# Patient Record
Sex: Female | Born: 1945 | Race: White | Hispanic: No | Marital: Married | Smoking: Never smoker
Health system: Southern US, Community
[De-identification: ages and names within clinical notes are randomized; demographics above are authoritative.]

## PROBLEM LIST (undated history)

## (undated) DIAGNOSIS — I803 Phlebitis and thrombophlebitis of lower extremities, unspecified: Secondary | ICD-10-CM

## (undated) DIAGNOSIS — T7840XA Allergy, unspecified, initial encounter: Secondary | ICD-10-CM

## (undated) DIAGNOSIS — L439 Lichen planus, unspecified: Secondary | ICD-10-CM

## (undated) DIAGNOSIS — G4700 Insomnia, unspecified: Secondary | ICD-10-CM

## (undated) DIAGNOSIS — K648 Other hemorrhoids: Secondary | ICD-10-CM

## (undated) DIAGNOSIS — I444 Left anterior fascicular block: Secondary | ICD-10-CM

## (undated) DIAGNOSIS — E559 Vitamin D deficiency, unspecified: Secondary | ICD-10-CM

## (undated) DIAGNOSIS — M797 Fibromyalgia: Secondary | ICD-10-CM

## (undated) DIAGNOSIS — Z9223 Personal history of estrogen therapy: Secondary | ICD-10-CM

## (undated) DIAGNOSIS — R6882 Decreased libido: Secondary | ICD-10-CM

## (undated) DIAGNOSIS — K1379 Other lesions of oral mucosa: Secondary | ICD-10-CM

## (undated) DIAGNOSIS — G43109 Migraine with aura, not intractable, without status migrainosus: Secondary | ICD-10-CM

## (undated) DIAGNOSIS — E7800 Pure hypercholesterolemia, unspecified: Secondary | ICD-10-CM

## (undated) DIAGNOSIS — M1990 Unspecified osteoarthritis, unspecified site: Secondary | ICD-10-CM

## (undated) DIAGNOSIS — M169 Osteoarthritis of hip, unspecified: Secondary | ICD-10-CM

## (undated) DIAGNOSIS — F909 Attention-deficit hyperactivity disorder, unspecified type: Secondary | ICD-10-CM

## (undated) DIAGNOSIS — B001 Herpesviral vesicular dermatitis: Secondary | ICD-10-CM

## (undated) DIAGNOSIS — J329 Chronic sinusitis, unspecified: Secondary | ICD-10-CM

## (undated) DIAGNOSIS — R1084 Generalized abdominal pain: Secondary | ICD-10-CM

## (undated) DIAGNOSIS — D649 Anemia, unspecified: Secondary | ICD-10-CM

## (undated) DIAGNOSIS — N9489 Other specified conditions associated with female genital organs and menstrual cycle: Secondary | ICD-10-CM

## (undated) DIAGNOSIS — F329 Major depressive disorder, single episode, unspecified: Secondary | ICD-10-CM

## (undated) DIAGNOSIS — I831 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: Secondary | ICD-10-CM

## (undated) DIAGNOSIS — L305 Pityriasis alba: Secondary | ICD-10-CM

## (undated) DIAGNOSIS — R51 Headache: Secondary | ICD-10-CM

## (undated) DIAGNOSIS — K5900 Constipation, unspecified: Secondary | ICD-10-CM

## (undated) DIAGNOSIS — R350 Frequency of micturition: Secondary | ICD-10-CM

## (undated) DIAGNOSIS — H6090 Unspecified otitis externa, unspecified ear: Secondary | ICD-10-CM

## (undated) DIAGNOSIS — G4710 Hypersomnia, unspecified: Secondary | ICD-10-CM

## (undated) DIAGNOSIS — H9312 Tinnitus, left ear: Secondary | ICD-10-CM

## (undated) DIAGNOSIS — F419 Anxiety disorder, unspecified: Secondary | ICD-10-CM

## (undated) DIAGNOSIS — K589 Irritable 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: Secondary | ICD-10-CM

## (undated) DIAGNOSIS — R1033 Periumbilical pain: Secondary | ICD-10-CM

## (undated) DIAGNOSIS — N390 Urinary tract infection, site not specified: Secondary | ICD-10-CM

## (undated) DIAGNOSIS — E039 Hypothyroidism, unspecified: Secondary | ICD-10-CM

## (undated) DIAGNOSIS — R32 Unspecified urinary incontinence: Secondary | ICD-10-CM

## (undated) DIAGNOSIS — N189 Chronic kidney disease, unspecified: Secondary | ICD-10-CM

## (undated) DIAGNOSIS — R946 Abnormal results of thyroid function studies: Secondary | ICD-10-CM

## (undated) DIAGNOSIS — N809 Endometriosis, unspecified: Secondary | ICD-10-CM

## (undated) DIAGNOSIS — M8580 Other specified disorders of bone density and structure, unspecified site: Secondary | ICD-10-CM

## (undated) DIAGNOSIS — L719 Rosacea, unspecified: Secondary | ICD-10-CM

## (undated) DIAGNOSIS — L509 Urticaria, unspecified: Secondary | ICD-10-CM

## (undated) DIAGNOSIS — T733XXA Exhaustion due to excessive exertion, initial encounter: Secondary | ICD-10-CM

## (undated) DIAGNOSIS — N201 Calculus of ureter: Secondary | ICD-10-CM

## (undated) DIAGNOSIS — L989 Disorder of the skin and subcutaneous tissue, unspecified: Secondary | ICD-10-CM

## (undated) DIAGNOSIS — J309 Allergic rhinitis, unspecified: Secondary | ICD-10-CM

## (undated) DIAGNOSIS — K30 Functional dyspepsia: Secondary | ICD-10-CM

## (undated) DIAGNOSIS — E785 Hyperlipidemia, unspecified: Secondary | ICD-10-CM

## (undated) DIAGNOSIS — S93409A Sprain of unspecified ligament of unspecified ankle, initial encounter: Secondary | ICD-10-CM

## (undated) DIAGNOSIS — F341 Dysthymic disorder: Secondary | ICD-10-CM

## (undated) DIAGNOSIS — Z8249 Family history of ischemic heart disease and other diseases of the circulatory system: Secondary | ICD-10-CM

## (undated) DIAGNOSIS — E739 Lactose intolerance, unspecified: Secondary | ICD-10-CM

## (undated) DIAGNOSIS — K429 Umbilical hernia without obstruction or gangrene: Secondary | ICD-10-CM

## (undated) DIAGNOSIS — E782 Mixed hyperlipidemia: Secondary | ICD-10-CM

## (undated) DIAGNOSIS — K219 Gastro-esophageal reflux disease without esophagitis: Secondary | ICD-10-CM

## (undated) DIAGNOSIS — K6289 Other specified diseases of anus and rectum: Secondary | ICD-10-CM

## (undated) HISTORY — PX: REDUCTION MAMMAPLASTY: SUR839

## (undated) HISTORY — DX: Unspecified otitis externa, unspecified ear: H60.90

## (undated) HISTORY — DX: Other specified diseases of anus and rectum: K62.89

## (undated) HISTORY — DX: Anemia, unspecified: D64.9

## (undated) HISTORY — DX: Other hemorrhoids: K64.8

## (undated) HISTORY — PX: TUBAL LIGATION: SHX77

## (undated) HISTORY — DX: Decreased libido: R68.82

## (undated) HISTORY — DX: Urticaria, unspecified: L50.9

## (undated) HISTORY — DX: Phlebitis and thrombophlebitis of lower extremities, unspecified: I80.3

## (undated) HISTORY — DX: Lichen planus, unspecified: L43.9

## (undated) HISTORY — DX: Functional dyspepsia: K30

## (undated) HISTORY — DX: Hyperlipidemia, unspecified: E78.5

## (undated) HISTORY — DX: Gastro-esophageal reflux disease without esophagitis: K21.9

## (undated) HISTORY — DX: Tinnitus, left ear: H93.12

## (undated) HISTORY — PX: COLONOSCOPY: SHX174

## (undated) HISTORY — DX: Mixed hyperlipidemia: E78.2

## (undated) HISTORY — DX: Endometriosis, unspecified: N80.9

## (undated) HISTORY — DX: Calculus of ureter: N20.1

## (undated) HISTORY — DX: Periumbilical pain: R10.33

## (undated) HISTORY — PX: BREAST ENHANCEMENT SURGERY: SHX7

## (undated) HISTORY — DX: Attention-deficit hyperactivity disorder, unspecified type: F90.9

## (undated) HISTORY — DX: Insomnia, unspecified: G47.00

## (undated) HISTORY — DX: Family history of ischemic heart disease and other diseases of the circulatory system: Z82.49

## (undated) HISTORY — DX: 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: I83.10

## (undated) HISTORY — DX: Frequency of micturition: R35.0

## (undated) HISTORY — PX: ROTATOR CUFF REPAIR: SHX139

## (undated) HISTORY — DX: Unspecified osteoarthritis, unspecified site: M19.90

## (undated) HISTORY — DX: Allergy, unspecified, initial encounter: T78.40XA

## (undated) HISTORY — DX: Migraine with aura, not intractable, without status migrainosus: G43.109

## (undated) HISTORY — DX: Personal history of estrogen therapy: Z92.23

## (undated) HISTORY — DX: Abnormal results of thyroid function studies: R94.6

## (undated) HISTORY — DX: Umbilical hernia without obstruction or gangrene: K42.9

## (undated) HISTORY — DX: Lactose intolerance, unspecified: E73.9

## (undated) HISTORY — DX: Disorder of the skin and subcutaneous tissue, unspecified: L98.9

## (undated) HISTORY — DX: Left anterior fascicular block: I44.4

## (undated) HISTORY — PX: ABDOMINAL HYSTERECTOMY: SHX81

## (undated) HISTORY — DX: Sprain of unspecified ligament of unspecified ankle, initial encounter: S93.409A

## (undated) HISTORY — DX: Urinary tract infection, site not specified: N39.0

## (undated) HISTORY — DX: Pure hypercholesterolemia, unspecified: E78.00

## (undated) HISTORY — DX: Exhaustion due to excessive exertion, initial encounter: T73.3XXA

## (undated) HISTORY — DX: Pityriasis alba: L30.5

## (undated) HISTORY — DX: Other specified conditions associated with female genital organs and menstrual cycle: N94.89

## (undated) HISTORY — DX: Hypersomnia, unspecified: G47.10

## (undated) HISTORY — DX: Osteoarthritis of hip, unspecified: M16.9

## (undated) HISTORY — DX: Rosacea, unspecified: L71.9

## (undated) HISTORY — DX: Herpesviral vesicular dermatitis: B00.1

## (undated) HISTORY — DX: Dysthymic disorder: F34.1

## (undated) HISTORY — DX: Major depressive disorder, single episode, unspecified: F32.9

## (undated) HISTORY — PX: NASAL SINUS SURGERY: SHX719

## (undated) HISTORY — PX: TOTAL ABDOMINAL HYSTERECTOMY: SHX209

## (undated) HISTORY — PX: OTHER SURGICAL HISTORY: SHX169

## (undated) HISTORY — DX: Other specified disorders of bone density and structure, unspecified site: M85.80

## (undated) HISTORY — DX: Vitamin D deficiency, unspecified: E55.9

## (undated) HISTORY — DX: Fibromyalgia: M79.7

## (undated) HISTORY — DX: Chronic sinusitis, unspecified: J32.9

## (undated) HISTORY — DX: Constipation, unspecified: K59.00

## (undated) HISTORY — DX: Allergic rhinitis, unspecified: J30.9

## (undated) HISTORY — DX: Other lesions of oral mucosa: K13.79

## (undated) HISTORY — DX: Irritable 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: K58.9

## (undated) HISTORY — DX: Unspecified urinary incontinence: R32

## (undated) HISTORY — DX: Generalized abdominal pain: R10.84

---

## 1998-07-20 ENCOUNTER — Ambulatory Visit (HOSPITAL_COMMUNITY): Admission: RE | Admit: 1998-07-20 | Discharge: 1998-07-20 | Payer: Self-pay | Admitting: *Deleted

## 1998-12-26 HISTORY — PX: OTHER SURGICAL HISTORY: SHX169

## 1999-06-01 ENCOUNTER — Other Ambulatory Visit: Admission: RE | Admit: 1999-06-01 | Discharge: 1999-06-01 | Payer: Self-pay | Admitting: Obstetrics and Gynecology

## 2000-02-01 ENCOUNTER — Encounter: Payer: Self-pay | Admitting: Family Medicine

## 2000-02-01 ENCOUNTER — Encounter: Admission: RE | Admit: 2000-02-01 | Discharge: 2000-02-01 | Payer: Self-pay | Admitting: Family Medicine

## 2000-02-07 ENCOUNTER — Ambulatory Visit (HOSPITAL_BASED_OUTPATIENT_CLINIC_OR_DEPARTMENT_OTHER): Admission: RE | Admit: 2000-02-07 | Discharge: 2000-02-07 | Payer: Self-pay | Admitting: Orthopedic Surgery

## 2000-04-11 ENCOUNTER — Other Ambulatory Visit: Admission: RE | Admit: 2000-04-11 | Discharge: 2000-04-11 | Payer: Self-pay | Admitting: Obstetrics and Gynecology

## 2000-07-12 ENCOUNTER — Encounter: Payer: Self-pay | Admitting: Internal Medicine

## 2000-07-12 ENCOUNTER — Encounter: Admission: RE | Admit: 2000-07-12 | Discharge: 2000-07-12 | Payer: Self-pay | Admitting: Internal Medicine

## 2002-05-21 ENCOUNTER — Encounter: Admission: RE | Admit: 2002-05-21 | Discharge: 2002-05-21 | Payer: Self-pay | Admitting: Internal Medicine

## 2002-05-21 ENCOUNTER — Encounter: Payer: Self-pay | Admitting: Internal Medicine

## 2002-06-04 ENCOUNTER — Other Ambulatory Visit: Admission: RE | Admit: 2002-06-04 | Discharge: 2002-06-04 | Payer: Self-pay | Admitting: Obstetrics and Gynecology

## 2002-08-08 ENCOUNTER — Ambulatory Visit (HOSPITAL_COMMUNITY): Admission: RE | Admit: 2002-08-08 | Discharge: 2002-08-08 | Payer: Self-pay | Admitting: Gastroenterology

## 2004-01-20 ENCOUNTER — Other Ambulatory Visit: Admission: RE | Admit: 2004-01-20 | Discharge: 2004-01-20 | Payer: Self-pay | Admitting: Obstetrics and Gynecology

## 2004-03-22 ENCOUNTER — Ambulatory Visit (HOSPITAL_COMMUNITY): Admission: RE | Admit: 2004-03-22 | Discharge: 2004-03-22 | Payer: Self-pay | Admitting: Chiropractic Medicine

## 2004-08-16 ENCOUNTER — Ambulatory Visit (HOSPITAL_BASED_OUTPATIENT_CLINIC_OR_DEPARTMENT_OTHER): Admission: RE | Admit: 2004-08-16 | Discharge: 2004-08-16 | Payer: Self-pay | Admitting: Orthopedic Surgery

## 2004-12-26 HISTORY — PX: SEPTOPLASTY WITH ETHMOIDECTOMY, AND MAXILLARY ANTROSTOMY: SHX6090

## 2005-04-27 ENCOUNTER — Encounter: Admission: RE | Admit: 2005-04-27 | Discharge: 2005-04-27 | Payer: Self-pay | Admitting: Obstetrics and Gynecology

## 2006-06-13 ENCOUNTER — Encounter: Admission: RE | Admit: 2006-06-13 | Discharge: 2006-06-13 | Payer: Self-pay | Admitting: Obstetrics and Gynecology

## 2006-06-26 ENCOUNTER — Encounter: Admission: RE | Admit: 2006-06-26 | Discharge: 2006-06-26 | Payer: Self-pay | Admitting: Obstetrics and Gynecology

## 2010-12-26 DIAGNOSIS — N189 Chronic kidney disease, unspecified: Secondary | ICD-10-CM

## 2010-12-26 HISTORY — DX: Chronic kidney disease, unspecified: N18.9

## 2011-01-16 ENCOUNTER — Encounter: Payer: Self-pay | Admitting: Internal Medicine

## 2013-01-21 ENCOUNTER — Other Ambulatory Visit: Payer: Self-pay | Admitting: Internal Medicine

## 2013-01-21 ENCOUNTER — Other Ambulatory Visit (HOSPITAL_COMMUNITY)
Admission: RE | Admit: 2013-01-21 | Discharge: 2013-01-21 | Disposition: A | Discharge status: Home / Self Care | Payer: BC Managed Care – PPO | Source: Ambulatory Visit | Attending: Internal Medicine | Admitting: Internal Medicine

## 2013-01-21 DIAGNOSIS — Z1151 Encounter for screening for human papillomavirus (HPV): Secondary | ICD-10-CM | POA: Insufficient documentation

## 2013-01-21 DIAGNOSIS — Z01419 Encounter for gynecological examination (general) (routine) without abnormal findings: Secondary | ICD-10-CM | POA: Insufficient documentation

## 2013-02-19 ENCOUNTER — Other Ambulatory Visit: Payer: Self-pay | Admitting: Gastroenterology

## 2013-02-19 ENCOUNTER — Encounter (HOSPITAL_COMMUNITY): Payer: Self-pay | Admitting: *Deleted

## 2013-02-19 NOTE — Pre-Procedure Instructions (Signed)
Your procedure is scheduled [id number], February 25, 2013 Report to [facility] Admitting [id number] Call this number if you have problems morning of your [phone]  Follow all bowel prep instructions per your doctor's orders.  Do not eat or drink anything after midnight the night before your procedure. You may brush your teeth, rinse out your mouth, but no water, no food, no chewing gum, no mints, no candies, no chewing tobacco.     Take these medicines the morning of your procedure with A SIP OF WATER:Pantoprazole   Please make arrangements for a responsible person to drive you home after the procedure. You cannot go home by cab/taxi. We recommend you have someone with you at home the first 24 hours after your procedure. Driver for procedure is spouse Onda Kattner [phone]  LEAVE ALL VALUABLES, JEWELRY, BILLFOLD AT HOME.  NO DENTURES, CONTACT LENSES ALLOWED IN THE ENDOSCOPY ROOM.   YOU MAY WEAR DEODORANT, PLEASE REMOVE ALL JEWELRY, WATCHES RINGS, BODY PIERCINGS AND LEAVE AT HOME.   WOMEN: NO MAKE-UP, LOTIONS PERFUMES

## 2013-02-22 ENCOUNTER — Encounter (HOSPITAL_COMMUNITY): Payer: Self-pay | Admitting: Pharmacy Technician

## 2013-02-25 ENCOUNTER — Encounter (HOSPITAL_COMMUNITY)
Admission: RE | Disposition: A | Discharge status: Home / Self Care | Payer: Self-pay | Source: Ambulatory Visit | Attending: Gastroenterology

## 2013-02-25 ENCOUNTER — Ambulatory Visit (HOSPITAL_COMMUNITY)
Admission: RE | Admit: 2013-02-25 | Discharge: 2013-02-25 | Disposition: A | Discharge status: Home / Self Care | Payer: BC Managed Care – PPO | Source: Ambulatory Visit | Attending: Gastroenterology | Admitting: Gastroenterology

## 2013-02-25 ENCOUNTER — Encounter (HOSPITAL_COMMUNITY): Payer: Self-pay | Admitting: *Deleted

## 2013-02-25 ENCOUNTER — Encounter (HOSPITAL_COMMUNITY): Payer: Self-pay | Admitting: Anesthesiology

## 2013-02-25 ENCOUNTER — Ambulatory Visit (HOSPITAL_COMMUNITY): Payer: BC Managed Care – PPO | Admitting: Anesthesiology

## 2013-02-25 DIAGNOSIS — Z79899 Other long term (current) drug therapy: Secondary | ICD-10-CM | POA: Insufficient documentation

## 2013-02-25 DIAGNOSIS — K219 Gastro-esophageal reflux disease without esophagitis: Secondary | ICD-10-CM | POA: Insufficient documentation

## 2013-02-25 DIAGNOSIS — Z7982 Long term (current) use of aspirin: Secondary | ICD-10-CM | POA: Insufficient documentation

## 2013-02-25 DIAGNOSIS — IMO0001 Reserved for inherently not codable concepts without codable children: Secondary | ICD-10-CM | POA: Insufficient documentation

## 2013-02-25 DIAGNOSIS — E039 Hypothyroidism, unspecified: Secondary | ICD-10-CM | POA: Insufficient documentation

## 2013-02-25 DIAGNOSIS — E7800 Pure hypercholesterolemia, unspecified: Secondary | ICD-10-CM | POA: Insufficient documentation

## 2013-02-25 DIAGNOSIS — Z1211 Encounter for screening for malignant neoplasm of colon: Secondary | ICD-10-CM | POA: Insufficient documentation

## 2013-02-25 HISTORY — DX: Chronic kidney disease, unspecified: N18.9

## 2013-02-25 HISTORY — PX: COLONOSCOPY WITH PROPOFOL: SHX5780

## 2013-02-25 HISTORY — DX: Unspecified osteoarthritis, unspecified site: M19.90

## 2013-02-25 HISTORY — DX: Pure hypercholesterolemia, unspecified: E78.00

## 2013-02-25 HISTORY — DX: Anxiety disorder, unspecified: F41.9

## 2013-02-25 HISTORY — DX: Headache: R51

## 2013-02-25 HISTORY — DX: Hypothyroidism, unspecified: E03.9

## 2013-02-25 SURGERY — COLONOSCOPY WITH PROPOFOL
Anesthesia: Monitor Anesthesia Care

## 2013-02-25 MED ORDER — MIDAZOLAM HCL 5 MG/5ML IJ SOLN
INTRAMUSCULAR | Status: DC | PRN
  Administered 2013-02-25: 2 mg via INTRAVENOUS

## 2013-02-25 MED ORDER — FENTANYL CITRATE 0.05 MG/ML IJ SOLN
INTRAMUSCULAR | Status: DC | PRN
  Administered 2013-02-25 (×2): 50 ug via INTRAVENOUS

## 2013-02-25 MED ORDER — ONDANSETRON HCL 4 MG/2ML IJ SOLN
INTRAMUSCULAR | Status: DC | PRN
  Administered 2013-02-25: 4 mg via INTRAVENOUS

## 2013-02-25 MED ORDER — LACTATED RINGERS IV SOLN
INTRAVENOUS | Status: DC | PRN
  Administered 2013-02-25: 09:00:00 via INTRAVENOUS

## 2013-02-25 MED ORDER — PROPOFOL 10 MG/ML IV EMUL
INTRAVENOUS | Status: DC | PRN
  Administered 2013-02-25: 200 ug/kg/min via INTRAVENOUS

## 2013-02-25 MED ORDER — SODIUM CHLORIDE 0.9 % IV SOLN: INTRAVENOUS | Status: DC

## 2013-02-25 MED ORDER — LACTATED RINGERS IV SOLN
INTRAVENOUS | Status: DC
  Administered 2013-02-25: 09:00:00 via INTRAVENOUS

## 2013-02-25 SURGICAL SUPPLY — 21 items

## 2013-02-25 NOTE — H&P (Signed)
  Procedure: Screening colonoscopy  History: The patient is a 67 year old female born 06/29/1946. On 08/08/2002, the patient underwent a normal screening colonoscopy under propofol sedation. The patient is scheduled to undergo a repeat screening colonoscopy today.  Chronic medications: Nystatin suspension. Crestor. MiraLAX. Aspirin. MetroGel. Hydrochlorothiazide. Acyclovir. Synthroid. Protonix. Menest. Ativan. Zaleplon. Salvella.  Past medical history: Fibromyalgia syndrome. Chronic urticaria. Hypothyroidism. Gastroesophageal reflux. Rosacea. Hypercholesterolemia. Constipation predominant irritable bowel syndrome. Dysthymia. Insomnia. Degenerative joint disease. Umbilical hernia repair. Rotator cuff surgery. Facelift surgery. Breast augmentation surgery. Total abdominal hysterectomy. Pityriasuis lichenocoid chronicum.  Medication allergies: Niacin. ketoconazole. Vytorin. Amoxicillin. Lexapro. Adhesive tape.  Habits: The patient does not smoke cigarettes. She consumes alcohol in moderation.  Plan: Proceed with screening colonoscopy with colon polyp removal to prevent colon cancer.

## 2013-02-25 NOTE — Op Note (Signed)
Procedure: Screening colonoscopy  Endoscopist: Danise Edge  Premedication: Propofol administered by anesthesia  Procedure: The patient was placed in the left lateral decubitus position. Anal inspection and digital rectal exam were normal. The Pentax pediatric colonoscope was introduced into the rectum and advanced to the cecum. A normal-appearing ileocecal valve and appendiceal orifice were identified. Colonic preparation for the exam today was good.  Rectum. Normal. Retroflexed view of the distal rectum normal.  Sigmoid colon and descending colon. Normal.  Splenic flexure. Normal.  Transverse colon. Normal.  Hepatic flexure. Normal.  Ascending colon. Normal.  Cecum and ileocecal valve. Normal.  Assessment: Normal screening proctocolonoscopy to the cecum.  Recommendation: Repeat screening colonoscopy in 10 years.

## 2013-02-25 NOTE — Anesthesia Postprocedure Evaluation (Signed)
  Anesthesia Post-op Note  Patient: Wendy Joyce  Procedure(s) Performed: Procedure(s) (LRB): COLONOSCOPY WITH PROPOFOL (N/A)  Patient Location: PACU  Anesthesia Type: MAC  Level of Consciousness: awake and alert   Airway and Oxygen Therapy: Patient Spontanous Breathing  Post-op Pain: mild  Post-op Assessment: Post-op Vital signs reviewed, Patient's Cardiovascular Status Stable, Respiratory Function Stable, Patent Airway and No signs of Nausea or vomiting  Last Vitals:  Filed Vitals:   02/25/13 1130  BP: 118/81  Pulse:   Temp:   Resp: 15    Post-op Vital Signs: stable   Complications: No apparent anesthesia complications

## 2013-02-25 NOTE — Anesthesia Preprocedure Evaluation (Signed)
Anesthesia Evaluation  Patient identified by MRN, date of birth, ID band Patient awake    Reviewed: Allergy & Precautions, H&P , NPO status , Patient's Chart, lab work & pertinent test results, reviewed documented beta blocker date and time   Airway Mallampati: II TM Distance: >3 FB Neck ROM: full    Dental no notable dental hx.    Pulmonary neg pulmonary ROS,  breath sounds clear to auscultation  Pulmonary exam normal       Cardiovascular Exercise Tolerance: Good negative cardio ROS  Rhythm:regular Rate:Normal     Neuro/Psych  Headaches, negative neurological ROS  negative psych ROS   GI/Hepatic negative GI ROS, Neg liver ROS, GERD-  Medicated,  Endo/Other  negative endocrine ROSHypothyroidism   Renal/GU Renal diseasenegative Renal ROS  negative genitourinary   Musculoskeletal   Abdominal   Peds  Hematology negative hematology ROS (+)   Anesthesia Other Findings   Reproductive/Obstetrics negative OB ROS                           Anesthesia Physical Anesthesia Plan  ASA: II  Anesthesia Plan: MAC   Post-op Pain Management:    Induction:   Airway Management Planned:   Additional Equipment:   Intra-op Plan:   Post-operative Plan:   Informed Consent: I have reviewed the patients History and Physical, chart, labs and discussed the procedure including the risks, benefits and alternatives for the proposed anesthesia with the patient or authorized representative who has indicated his/her understanding and acceptance.   Dental Advisory Given  Plan Discussed with: CRNA  Anesthesia Plan Comments:         Anesthesia Quick Evaluation

## 2013-02-25 NOTE — Transfer of Care (Signed)
Immediate Anesthesia Transfer of Care Note  Patient: Wendy Joyce  Procedure(s) Performed: Procedure(s) (LRB): COLONOSCOPY WITH PROPOFOL (N/A)  Patient Location: PACU  Anesthesia Type: MAC  Level of Consciousness: sedated, patient cooperative and responds to stimulaton  Airway & Oxygen Therapy: Patient Spontanous Breathing and Patient connected to face mask oxgen  Post-op Assessment: Report given to PACU RN and Post -op Vital signs reviewed and stable  Post vital signs: Reviewed and stable  Complications: No apparent anesthesia complications

## 2013-02-26 ENCOUNTER — Encounter (HOSPITAL_COMMUNITY): Payer: Self-pay | Admitting: Gastroenterology

## 2013-09-06 ENCOUNTER — Ambulatory Visit: Payer: BC Managed Care – PPO | Admitting: Internal Medicine

## 2013-09-26 ENCOUNTER — Encounter: Payer: Self-pay | Admitting: Internal Medicine

## 2013-09-26 ENCOUNTER — Ambulatory Visit (INDEPENDENT_AMBULATORY_CARE_PROVIDER_SITE_OTHER): Payer: BC Managed Care – PPO | Admitting: Internal Medicine

## 2013-09-26 ENCOUNTER — Other Ambulatory Visit: Payer: Self-pay | Admitting: Internal Medicine

## 2013-09-26 VITALS — BP 112/70 | HR 84 | Temp 98.1°F | Resp 10 | Ht 63.5 in | Wt 132.5 lb

## 2013-09-26 DIAGNOSIS — E038 Other specified hypothyroidism: Secondary | ICD-10-CM

## 2013-09-26 DIAGNOSIS — E039 Hypothyroidism, unspecified: Secondary | ICD-10-CM

## 2013-09-26 LAB — T3, FREE: T3, Free: 4.1 pg/mL (ref 2.3–4.2)

## 2013-09-26 LAB — TSH: TSH: 0.021 u[IU]/mL — ABNORMAL LOW (ref 0.350–4.500)

## 2013-09-26 NOTE — Progress Notes (Signed)
Patient ID: Wendy Joyce, female   DOB: [birth year], 67 y.o.   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 She sees Dr Joesphine [facility]).   Pt tells me she has been told her Hypothalamus has "a second floor" while she was in college. This was based on appearance on Xrays then and does not remember having an  MRI afterwards. She was then told that this condition did not cause problems then (other than central hypothyroidism) but may cause problems in the future. She is wondering if there is a connection between her hypothalamic condition and her current symptoms. 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 - neurological dyslexia)  Pt. has been dx with hypothyroidism in college as mentioned above; was on Levothyroxine 112 mcg >> now in the last month, on Armour thyroid 90 mg, taken: - fasting - with water - separated by 2h from b'fast  - separated by >4h from calcium - separated by >4h from PPIs - no iron, multivitamins   I did not have  thyroid tests to review.    Pt denies feeling nodules in neck, has hoarseness, no dysphagia/odynophagia, SOB with lying down.   She c/o: - weight gain - mostly in abdomen, despite eating healthy and exercising regularly. - fatigue - constipation - fibromyalgia generalized pain - difficulty losing weight (per our chart, she lost 8 lbs since 02/2013). - dry skin - hair falling - problems with concentration - mood swings, depression  No h/o infertility and had 2 children. Had hysterectomy at 67 y/o b/c blood clots.  She has a + FH of thyroid disorders in: mother and sisters. No FH of thyroid cancer.  No h/o radiation tx to head or neck.  I reviewed her chart and she also has a history of HTN, HL - on Crestor, GERD - on Protonix, fibromyalgia, anxiety - on Ativan, DJD,  chronic urticaria, IBS - constipation, h/o face lifting and breast augmentation sx.  ROS: Constitutional: + weight gain, + fatigue, no subjective hyperthermia/hypothermia Eyes: no blurry vision, no xerophthalmia ENT: no sore throat, no nodules palpated in throat, no dysphagia/odynophagia, + hoarseness; + decreased hearing, + tinnitus Cardiovascular: no CP/SOB/palpitations/leg swelling Respiratory: no cough/SOB Gastrointestinal: no N/V/D/+ C, + heartburn Musculoskeletal: no muscle/joint aches Skin: + rashes, + hair loss Neurological: no tremors/numbness/tingling/dizziness Psychiatric: + depression/+ anxiety  Past Medical History  Diagnosis Date  . Hypothyroidism   . High cholesterol   . GERD (gastroesophageal reflux disease)   . Anxiety   . Chronic kidney disease 2012    kidney stones  . Headache(784.0)   . Arthritis     hands    Past Surgical History  Procedure Laterality Date  . Abdominal hysterectomy      partial  . Umbicial hernia    . Rotator cuff repair      Right  . Rotator cuff repair      Left  . Nasal sinus surgery    . Tubal ligation    . Colonoscopy with propofol N/A 02/25/2013    Procedure: COLONOSCOPY WITH PROPOFOL;  Surgeon: Charolett Bumpers, MD;  Location: [facility];  Service: Endoscopy;  Laterality: N/A;    History   Social History  . Marital Status: Married    Spouse Name: N/A    Number of Children: 2   Occupational History  .  artist   Social History Main Topics  . Smoking status: Never Smoker   . Smokeless tobacco: Not on file  . Alcohol Use:      3-4 drinks of wine/vodka per week  . Drug Use: No  . Sexual Activity: Yes    Partners: Male   Other Topics Concern  . Not on file   Social History Narrative   Married   Regular exercise: yes; 2 x a week with a trainer   Caffeine use: coffee daily   Current Outpatient Prescriptions on File Prior to Visit  Medication Sig Dispense Refill  . acyclovir ointment (ZOVIRAX) 5 % Apply 1  application topically daily as needed (for fever blister).      Marland Kitchen aspirin EC 81 MG tablet Take 162 mg by mouth daily.      . Calcium Carbonate-Vitamin D (CALCIUM 500 + D PO) Take by mouth.      Marland Kitchen CALCIUM-VITAMIN D-VITAMIN K PO Take 1 tablet by mouth daily. Calcium 500 mg      . carboxymethylcellulose (REFRESH TEARS) 0.5 % SOLN Place 1 drop into both eyes daily as needed (for dry eyes).      . cetirizine (ZYRTEC) 10 MG tablet Take 10 mg by mouth daily.      . clobetasol (OLUX) 0.05 % topical foam Apply 1 application topically daily as needed (for flares).      . Coenzyme Q10 (CO Q 10) 100 MG CAPS Take 100 mg by mouth at bedtime.      Marland Kitchen dextromethorphan-guaiFENesin (MUCINEX DM) 30-600 MG per 12 hr tablet Take 1 tablet by mouth every 12 (twelve) hours.      Marland Kitchen Esterified Estrogens (MENEST) 0.3 MG tablet Take 0.3 mg by mouth.      . fish oil-omega-3 fatty acids 1000 MG capsule Take 2 g by mouth daily.      . hydrochlorothiazide (MICROZIDE) 12.5 MG capsule Take 12.5 mg by mouth daily.      Marland Kitchen LORazepam (ATIVAN) 1 MG tablet Take 0.5 mg by mouth at bedtime as needed for anxiety.      . metroNIDAZOLE (METROGEL) 1 % gel Apply 1 application topically daily. For rosacea      . Omega-3 Fatty Acids (FISH OIL PO) Take 681 mg by mouth daily.      Marland Kitchen OVER THE COUNTER MEDICATION Take 393 mg by mouth daily. Probiotic      . polyethylene glycol (MIRALAX / GLYCOLAX) packet Take 17 g by mouth at bedtime.      . rosuvastatin (CRESTOR) 10 MG tablet Take 5 mg by mouth at bedtime.      . tretinoin (RETIN-A) 0.025 % cream Apply 1 application topically at bedtime as needed (for skin).      . valACYclovir (VALTREX) 500 MG tablet Take 250 mg by mouth 3 (three) times a week.      . zaleplon (SONATA) 10 MG capsule Take 10 mg by mouth at bedtime.      . Milnacipran (SAVELLA) 50 MG TABS Take 50 mg by mouth 2 (two) times daily.      . pantoprazole (PROTONIX) 40 MG tablet Take 40 mg by mouth daily.      . Turmeric 450 MG CAPS Take  450 mg by mouth daily.       No current facility-administered medications on file prior to visit.   Allergies  Allergen Reactions  . Adhesive [Tape] Dermatitis  . Amoxicillin     Unknown reaction  . Citrus Nausea And Vomiting  .  Niacin And Related     Rash and breathing breathing problems   History reviewed. No pertinent family history.   PE: BP 112/70  Pulse 84  Temp(Src) 98.1 F (36.7 C) (Oral)  Resp 10  Ht 5' 3.5" (1.613 m)  Wt 132 lb 8 oz (60.102 kg)  BMI 23.1 kg/m2  SpO2 98% Wt Readings from Last 3 Encounters:  09/26/13 132 lb 8 oz (60.102 kg)  02/25/13 140 lb (63.504 kg)  02/25/13 140 lb (63.504 kg)   Constitutional: normal weight, in NAD but appears weak Eyes: PERRLA, EOMI, no exophthalmos ENT: moist mucous membranes, no thyromegaly, no cervical lymphadenopathy Cardiovascular: RRR, No MRG Respiratory: CTA B Gastrointestinal: abdomen soft, NT, ND, BS+ Musculoskeletal: no deformities, strength intact in all 4 Skin: moist, warm, no rashes Neurological: no tremor with outstretched hands, DTR normal in all 4  ASSESSMENT: 1. Central Hypothyroidism - ? If pituitary hormone deficiencies  PLAN:  1. Patient with long-standing apparently central hypothyroidism, on Armour Thyroid therapy (equivalent to 150 mcg daily of fT4).  - We discussed about correct intake of thyroid hormones, fasting, with water, separated by at least 30 minutes from breakfast, and separated by more than 4 hours from calcium, iron, multivitamins, acid reflux medications (PPIs). - She has an unusual h/o hypothalamus dysfunction (? HT has a "2nd floor", diagnostic established in college). Since dx, she had no problems getting pregnant and had 2 children, so had normal fertility, which is a good indication that at least part of her HT/pituitary was functioning normally. Unclear prior evaluation for pituitary insufficiency, so we will check the following labs today: Orders Placed This Encounter   Procedures  . TSH  . T4, free  . T3, free  . Cortisol  . ACTH  . Insulin-like growth factor  . Prolactin  . Cortisol  . ACTH  - she is on Estrogen HRT, cannot check LH and FSH - if labs abnormal, might need a pituitary MRI - If TFTs are abnormal, she will need to return in 6-8 weeks for repeat labs - after discussion about pro and cons of Armour, she agrees to switch back to Levothyroxine - I am not aware about a connection b/w dyslexia and HT problems, especially since I am not sure what the actual dx was at that time (~45 years ago). - If labs are normal, I will see her back in 4 months  Orders Only on 09/26/2013  Component Date Value Range Status  . TSH 09/26/2013 0.021* 0.350 - 4.500 uIU/mL Final  . Free T4 09/26/2013 1.22  0.80 - 1.80 ng/dL Final  . Prolactin 16/09/9603 4.5   Final   Comment:      Reference Ranges:                                           Female:                       2.1 -  17.1 ng/ml                                           Female:   Pregnant          9.7 - 208.5 ng/mL  Non Pregnant      2.8 -  29.2 ng/mL                                                     Post Menopausal   1.8 -  20.3 ng/mL                                              . Cortisol, Plasma 09/26/2013 3.5   Final   Comment:                            AM:  4.3 - 22.4 ug/dL                          PM:  3.1 - 16.7 ug/dL  . T3, Free 09/26/2013 4.1  2.3 - 4.2 pg/mL Final  Orders Only on 09/26/2013  Component Date Value Range Status  . W295 ACTH 09/26/2013 <5* 10 - 46 pg/mL Final   Comment:                            Due to significant diurnal variation of plasma ACTH levels, reference                          ranges have typically been established for a collection time of 8-10                          AM.                             . Somatomedin (IGF-I) 09/26/2013 66  31 - 179 ng/mL Final   TSH low, but unclear if from overdose of Armour  or central hypothyroidism. I suggested she switch to a lower dose Levothyroxine and will need TFTs in 6-8 weeks. I called in 125 mcg Levothyroxine. IGF1 normal. PRL normal.  Cortisol normal for afternoon, ACTH undetectable, but will need to repeat at 8 am >> advised pt to return ASAP for 8 am labs. She confirmed lab appt. If still low, might need a cosyntropin stim test (but this is not ideal for central adrenal insufficiency).   8 am cortisol normal: Component     Latest Ref Rng 10/01/2013  Cortisol, Plasma      10.9   Called and discussed with the patient about the above results, but show normal pituitary function with the possible exception of central hypothyroidism. I explained it is very difficult to differentiate between central hypothyroidism and over replacement with thyroid hormone in her case, but we will try to do this when she returns to have labs in 6 weeks from now. She has started levothyroxine 125 mcg daily to go. I explained that the ACTH was low, however ACTH is very finicky and it degrades if not frozen immediately upon collection. In her case, a cortisol of 10.9 is normal at 8 AM.

## 2013-09-26 NOTE — Patient Instructions (Addendum)
Please stop at the lab. We will discuss about further plan when the results are back. Please join MyChart so we can communicate easier.

## 2013-09-27 DIAGNOSIS — E038 Other specified hypothyroidism: Secondary | ICD-10-CM | POA: Insufficient documentation

## 2013-09-27 DIAGNOSIS — E039 Hypothyroidism, unspecified: Secondary | ICD-10-CM | POA: Insufficient documentation

## 2013-09-27 LAB — PROLACTIN: Prolactin: 4.5 ng/mL

## 2013-09-27 LAB — CORTISOL: Cortisol, Plasma: 3.5 ug/dL

## 2013-09-27 MED ORDER — LEVOTHYROXINE SODIUM 125 MCG PO TABS: 125.0000 ug | ORAL_TABLET | Freq: Every day | ORAL | Status: DC

## 2013-10-01 ENCOUNTER — Other Ambulatory Visit (INDEPENDENT_AMBULATORY_CARE_PROVIDER_SITE_OTHER): Payer: BC Managed Care – PPO

## 2013-10-01 ENCOUNTER — Encounter: Payer: Self-pay | Admitting: Internal Medicine

## 2013-10-01 DIAGNOSIS — E038 Other specified hypothyroidism: Secondary | ICD-10-CM

## 2013-10-01 DIAGNOSIS — E039 Hypothyroidism, unspecified: Secondary | ICD-10-CM

## 2013-10-02 ENCOUNTER — Telehealth: Payer: Self-pay | Admitting: *Deleted

## 2013-10-02 LAB — ACTH

## 2013-10-02 NOTE — Telephone Encounter (Signed)
[facility] called stating the pt's labs did not come in at the right temperature (the sample was suppose to be frozen). Pt will need labs drawn again. Please advise.

## 2013-10-02 NOTE — Telephone Encounter (Signed)
Ah! That explains the abnormal ACTH with the normal cortisol! No need to repeat since we have a normal cortisol. I will call her tomorrow.

## 2013-10-31 ENCOUNTER — Other Ambulatory Visit: Payer: Self-pay

## 2013-11-12 ENCOUNTER — Other Ambulatory Visit: Payer: Self-pay | Admitting: Dermatology

## 2014-03-27 ENCOUNTER — Encounter: Payer: Self-pay | Admitting: Internal Medicine

## 2014-03-27 ENCOUNTER — Ambulatory Visit (INDEPENDENT_AMBULATORY_CARE_PROVIDER_SITE_OTHER): Payer: Medicare Other | Admitting: Internal Medicine

## 2014-03-27 VITALS — BP 108/62 | HR 61 | Temp 98.3°F | Resp 12 | Wt 124.0 lb

## 2014-03-27 DIAGNOSIS — E038 Other specified hypothyroidism: Secondary | ICD-10-CM

## 2014-03-27 DIAGNOSIS — E039 Hypothyroidism, unspecified: Secondary | ICD-10-CM

## 2014-03-27 MED ORDER — LEVOTHYROXINE SODIUM 125 MCG PO TABS
125.0000 ug | ORAL_TABLET | Freq: Every day | ORAL | Status: DC
Start: 2014-03-27 — End: 2014-04-11

## 2014-03-27 NOTE — Progress Notes (Signed)
Patient ID: Wendy Joyce, female   DOB: 12/06/1946, 68 y.o.   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 She sees Dr Luane School [facility]). Last visit 5 mo ago.  Reviewed hx: Pt has been told her Hypothalamus has "a second floor" while she was in college. This was based on appearance on Xrays then and does not remember having an  MRI afterwards. She was then told that this condition did not cause problems then (other than central hypothyroidism) but may cause problems in the future. She is wondering if there is a connection between her hypothalamic condition and her current symptoms. 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 - neurological dyslexia)  Pt. has been dx with hypothyroidism in college as mentioned above; was on Levothyroxine 112 mcg >> then Armour thyroid 90 mg >> now levothyroxine 125 mcg, taken: - fasting - with water - separated by 2h from b'fast   Reviewed TFTs: Per records from PCP: 01/28/2014: fT4 1.42 (0.61-1.12) Lab Results  Component Value Date   TSH 0.021* 09/26/2013   FREET4 1.22 09/26/2013   At last visit, we checked: Component     Latest Ref Rng 09/26/2013 10/01/2013  Prolactin      4.5   Cortisol, Plasma      3.5 (pm) 10.9 (8 am)  Somatomedin (IGF-I)     31 - 179 ng/mL 66   ACTH returned undetectable (I believe from processing out of ice) >> will need repeat.   She has - + weight loss (stopped pasta, bread, wine; + exercise ) - no fatigue - no constipation - fibromyalgia generalized pain - improved - improved mood swings, depression  I reviewed her chart and she also has a history of HTN, HL - on Crestor, GERD - on Protonix, fibromyalgia, anxiety - on Ativan, DJD, chronic urticaria, IBS - constipation, h/o face lifting and breast augmentation sx.  ROS: Constitutional: see  HPI Eyes: no blurry vision, no xerophthalmia ENT: no sore throat, no nodules palpated in throat, no dysphagia/odynophagia, no hoarseness; + decreased hearing, + tinnitus Cardiovascular: no CP/SOB/palpitations/leg swelling Respiratory: no cough/SOB Gastrointestinal: no N/V/D/C/heartburn Musculoskeletal: no muscle/joint aches Skin: no rashes Neurological: no tremors/numbness/tingling/dizziness  PE: BP 108/62  Pulse 61  Temp(Src) 98.3 F (36.8 C) (Oral)  Resp 12  Wt 124 lb (56.246 kg)  SpO2 96% Body mass index is 21.62 kg/(m^2).  Wt Readings from Last 3 Encounters:  03/27/14 124 lb (56.246 kg)  09/26/13 132 lb 8 oz (60.102 kg)  02/25/13 140 lb (63.504 kg)   Constitutional: normal weight, in NAD Eyes: PERRLA, EOMI, no exophthalmos ENT: moist mucous membranes, no thyromegaly, no cervical lymphadenopathy Cardiovascular: RRR, No MRG Respiratory: CTA B Gastrointestinal: abdomen soft, NT, ND, BS+ Musculoskeletal: no deformities, strength intact in all 4 Skin: moist, warm, no rashes Neurological: no tremor with outstretched hands, DTR normal in all 4  ASSESSMENT: 1. Central Hypothyroidism - She has an unusual h/o hypothalamus dysfunction (? HT has a "2nd floor", diagnostic established in college). Since dx, she had no problems getting pregnant and had 2 children, so had normal fertility, which is a good indication that at least part of her HT/pituitary was functioning normally. Unclear prior evaluation for pituitary insufficiency - no pituitary hormone deficiencies other than TSH per check in 09/2013 - ACTH low at last visit >> need to  recheck - cortisol normal - on Estrogen HRT, cannot check LH and FSH  PLAN:  1. Patient with long-standing central hypothyroidism,  - takes the thyroid hormone correctly, fasting, with water, separated by at least 30 minutes from breakfast - pt was previously on Armour Thyroid therapy (equivalent to 150 mcg daily of fT4), then changed to Synthroid  125 mcg >> feels very good on this, but did not come back for labs in 6 weeks - will repeat 6he cortisol and ACTH since the ACTH was low at last visit (I suspect degradation) >> will recheck - check fT4 now (at ~ 11 am, she takes her LT4 around 5 am) >> if abnormal, she will need to return in 6-8 weeks for repeat labs - If labs are normal, I will see her back in 1 year  Component     Latest Ref Rng 04/08/2014  C206 ACTH     6 - 50 pg/mL 11  Cortisol, Plasma      8.5  Free T4     0.60 - 1.60 ng/dL 1.50   Normal tests, will continue the same LT4 dose >> will call in a refill

## 2014-03-27 NOTE — Patient Instructions (Signed)
Please return at 8 am to [facility] lab downstairs for labs. Please return in 1 year.

## 2014-04-08 ENCOUNTER — Other Ambulatory Visit: Payer: Self-pay | Admitting: *Deleted

## 2014-04-08 ENCOUNTER — Other Ambulatory Visit (INDEPENDENT_AMBULATORY_CARE_PROVIDER_SITE_OTHER): Payer: Medicare Other

## 2014-04-08 DIAGNOSIS — E039 Hypothyroidism, unspecified: Secondary | ICD-10-CM

## 2014-04-08 DIAGNOSIS — E038 Other specified hypothyroidism: Secondary | ICD-10-CM

## 2014-04-08 LAB — T4, FREE: FREE T4: 1.5 ng/dL (ref 0.60–1.60)

## 2014-04-11 LAB — CORTISOL: Cortisol, Plasma: 8.5 ug/dL

## 2014-04-11 LAB — ACTH: C206 ACTH: 11 pg/mL (ref 6–50)

## 2014-04-11 MED ORDER — LEVOTHYROXINE SODIUM 125 MCG PO TABS: 125.0000 ug | ORAL_TABLET | Freq: Every day | ORAL | Status: DC

## 2015-01-29 DIAGNOSIS — F419 Anxiety disorder, unspecified: Secondary | ICD-10-CM

## 2015-01-29 DIAGNOSIS — K589 Irritable 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: Secondary | ICD-10-CM

## 2015-01-29 DIAGNOSIS — J309 Allergic rhinitis, unspecified: Secondary | ICD-10-CM

## 2015-01-29 DIAGNOSIS — L989 Disorder of the skin and subcutaneous tissue, unspecified: Secondary | ICD-10-CM

## 2015-01-29 DIAGNOSIS — H9312 Tinnitus, left ear: Secondary | ICD-10-CM

## 2015-01-29 DIAGNOSIS — L305 Pityriasis alba: Secondary | ICD-10-CM

## 2015-01-29 DIAGNOSIS — R350 Frequency of micturition: Secondary | ICD-10-CM

## 2015-01-29 DIAGNOSIS — K5900 Constipation, unspecified: Secondary | ICD-10-CM

## 2015-01-29 DIAGNOSIS — L439 Lichen planus, unspecified: Secondary | ICD-10-CM

## 2015-01-29 DIAGNOSIS — K219 Gastro-esophageal reflux disease without esophagitis: Secondary | ICD-10-CM

## 2015-01-29 DIAGNOSIS — R1084 Generalized abdominal pain: Secondary | ICD-10-CM

## 2015-01-29 DIAGNOSIS — I831 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: Secondary | ICD-10-CM

## 2015-01-29 DIAGNOSIS — K30 Functional dyspepsia: Secondary | ICD-10-CM

## 2015-01-29 DIAGNOSIS — M797 Fibromyalgia: Secondary | ICD-10-CM

## 2015-01-29 DIAGNOSIS — K429 Umbilical hernia without obstruction or gangrene: Secondary | ICD-10-CM

## 2015-01-29 HISTORY — DX: Irritable bowel syndrome, unspecified: K58.9

## 2015-01-29 HISTORY — DX: Functional dyspepsia: K30

## 2015-01-29 HISTORY — DX: Gastro-esophageal reflux disease without esophagitis: K21.9

## 2015-01-29 HISTORY — DX: Frequency of micturition: R35.0

## 2015-01-29 HISTORY — DX: Umbilical hernia without obstruction or gangrene: K42.9

## 2015-01-29 HISTORY — DX: Allergic rhinitis, unspecified: J30.9

## 2015-01-29 HISTORY — DX: Generalized abdominal pain: R10.84

## 2015-01-29 HISTORY — DX: Tinnitus, left ear: H93.12

## 2015-01-29 HISTORY — DX: Fibromyalgia: M79.7

## 2015-01-29 HISTORY — DX: Anxiety disorder, unspecified: F41.9

## 2015-01-29 HISTORY — DX: 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: I83.10

## 2015-01-29 HISTORY — DX: Constipation, unspecified: K59.00

## 2015-01-29 HISTORY — DX: Disorder of the skin and subcutaneous tissue, unspecified: L98.9

## 2015-01-29 HISTORY — DX: Lichen planus, unspecified: L43.9

## 2015-01-29 HISTORY — DX: Pityriasis alba: L30.5

## 2015-12-24 DIAGNOSIS — J329 Chronic sinusitis, unspecified: Secondary | ICD-10-CM

## 2015-12-24 HISTORY — DX: Chronic sinusitis, unspecified: J32.9

## 2016-01-07 DIAGNOSIS — K1379 Other lesions of oral mucosa: Secondary | ICD-10-CM

## 2016-01-07 DIAGNOSIS — R32 Unspecified urinary incontinence: Secondary | ICD-10-CM

## 2016-01-07 DIAGNOSIS — J329 Chronic sinusitis, unspecified: Secondary | ICD-10-CM

## 2016-01-07 HISTORY — DX: Other lesions of oral mucosa: K13.79

## 2016-01-07 HISTORY — DX: Chronic sinusitis, unspecified: J32.9

## 2016-01-07 HISTORY — DX: Unspecified urinary incontinence: R32

## 2016-01-14 ENCOUNTER — Other Ambulatory Visit (HOSPITAL_COMMUNITY)
Admission: RE | Admit: 2016-01-14 | Discharge: 2016-01-14 | Disposition: A | Discharge status: Home / Self Care | Payer: PPO | Source: Ambulatory Visit | Attending: Internal Medicine | Admitting: Internal Medicine

## 2016-01-14 ENCOUNTER — Other Ambulatory Visit: Payer: Self-pay | Admitting: Internal Medicine

## 2016-01-14 DIAGNOSIS — Z1151 Encounter for screening for human papillomavirus (HPV): Secondary | ICD-10-CM | POA: Insufficient documentation

## 2016-01-14 DIAGNOSIS — Z01419 Encounter for gynecological examination (general) (routine) without abnormal findings: Secondary | ICD-10-CM | POA: Diagnosis not present

## 2016-01-18 DIAGNOSIS — Z803 Family history of malignant neoplasm of breast: Secondary | ICD-10-CM | POA: Diagnosis not present

## 2016-01-18 DIAGNOSIS — Z1231 Encounter for screening mammogram for malignant neoplasm of breast: Secondary | ICD-10-CM | POA: Diagnosis not present

## 2016-01-18 LAB — CYTOLOGY - PAP

## 2016-01-21 DIAGNOSIS — J329 Chronic sinusitis, unspecified: Secondary | ICD-10-CM | POA: Diagnosis not present

## 2016-01-21 DIAGNOSIS — K1379 Other lesions of oral mucosa: Secondary | ICD-10-CM | POA: Diagnosis not present

## 2016-01-21 DIAGNOSIS — R32 Unspecified urinary incontinence: Secondary | ICD-10-CM | POA: Diagnosis not present

## 2016-01-21 DIAGNOSIS — G4700 Insomnia, unspecified: Secondary | ICD-10-CM | POA: Diagnosis not present

## 2016-02-02 DIAGNOSIS — J329 Chronic sinusitis, unspecified: Secondary | ICD-10-CM | POA: Diagnosis not present

## 2016-02-02 DIAGNOSIS — J358 Other chronic diseases of tonsils and adenoids: Secondary | ICD-10-CM | POA: Diagnosis not present

## 2016-02-24 DIAGNOSIS — E782 Mixed hyperlipidemia: Secondary | ICD-10-CM | POA: Diagnosis not present

## 2016-02-24 DIAGNOSIS — Z1389 Encounter for screening for other disorder: Secondary | ICD-10-CM | POA: Diagnosis not present

## 2016-02-24 DIAGNOSIS — T733XXA Exhaustion due to excessive exertion, initial encounter: Secondary | ICD-10-CM

## 2016-02-24 DIAGNOSIS — Z0001 Encounter for general adult medical examination with abnormal findings: Secondary | ICD-10-CM | POA: Diagnosis not present

## 2016-02-24 DIAGNOSIS — K5900 Constipation, unspecified: Secondary | ICD-10-CM | POA: Diagnosis not present

## 2016-02-24 DIAGNOSIS — Z79899 Other long term (current) drug therapy: Secondary | ICD-10-CM | POA: Diagnosis not present

## 2016-02-24 DIAGNOSIS — R32 Unspecified urinary incontinence: Secondary | ICD-10-CM | POA: Diagnosis not present

## 2016-02-24 DIAGNOSIS — F909 Attention-deficit hyperactivity disorder, unspecified type: Secondary | ICD-10-CM | POA: Diagnosis not present

## 2016-02-24 DIAGNOSIS — J329 Chronic sinusitis, unspecified: Secondary | ICD-10-CM

## 2016-02-24 DIAGNOSIS — E039 Hypothyroidism, unspecified: Secondary | ICD-10-CM | POA: Diagnosis not present

## 2016-02-24 DIAGNOSIS — K219 Gastro-esophageal reflux disease without esophagitis: Secondary | ICD-10-CM | POA: Diagnosis not present

## 2016-02-24 DIAGNOSIS — E559 Vitamin D deficiency, unspecified: Secondary | ICD-10-CM | POA: Diagnosis not present

## 2016-02-24 HISTORY — DX: Chronic sinusitis, unspecified: J32.9

## 2016-02-24 HISTORY — DX: Exhaustion due to excessive exertion, initial encounter: T73.3XXA

## 2016-02-25 DIAGNOSIS — R946 Abnormal results of thyroid function studies: Secondary | ICD-10-CM

## 2016-02-25 HISTORY — DX: Abnormal results of thyroid function studies: R94.6

## 2016-03-03 DIAGNOSIS — S93402A Sprain of unspecified ligament of left ankle, initial encounter: Secondary | ICD-10-CM | POA: Diagnosis not present

## 2016-03-03 DIAGNOSIS — S7002XA Contusion of left hip, initial encounter: Secondary | ICD-10-CM | POA: Diagnosis not present

## 2016-03-09 DIAGNOSIS — R351 Nocturia: Secondary | ICD-10-CM | POA: Diagnosis not present

## 2016-03-09 DIAGNOSIS — R350 Frequency of micturition: Secondary | ICD-10-CM | POA: Diagnosis not present

## 2016-03-09 DIAGNOSIS — N3946 Mixed incontinence: Secondary | ICD-10-CM | POA: Diagnosis not present

## 2016-03-16 DIAGNOSIS — S93409A Sprain of unspecified ligament of unspecified ankle, initial encounter: Secondary | ICD-10-CM

## 2016-03-16 DIAGNOSIS — E039 Hypothyroidism, unspecified: Secondary | ICD-10-CM | POA: Diagnosis not present

## 2016-03-16 HISTORY — DX: Sprain of unspecified ligament of unspecified ankle, initial encounter: S93.409A

## 2016-04-06 DIAGNOSIS — N3946 Mixed incontinence: Secondary | ICD-10-CM | POA: Diagnosis not present

## 2016-04-06 DIAGNOSIS — R350 Frequency of micturition: Secondary | ICD-10-CM | POA: Diagnosis not present

## 2016-04-06 DIAGNOSIS — R351 Nocturia: Secondary | ICD-10-CM | POA: Diagnosis not present

## 2016-04-14 DIAGNOSIS — N3946 Mixed incontinence: Secondary | ICD-10-CM | POA: Diagnosis not present

## 2016-04-14 DIAGNOSIS — R350 Frequency of micturition: Secondary | ICD-10-CM | POA: Diagnosis not present

## 2016-05-05 DIAGNOSIS — S83241A Other tear of medial meniscus, current injury, right knee, initial encounter: Secondary | ICD-10-CM | POA: Diagnosis not present

## 2016-05-24 DIAGNOSIS — Z0000 Encounter for general adult medical examination without abnormal findings: Secondary | ICD-10-CM | POA: Diagnosis not present

## 2016-05-25 DIAGNOSIS — R946 Abnormal results of thyroid function studies: Secondary | ICD-10-CM | POA: Diagnosis not present

## 2016-06-29 DIAGNOSIS — E7800 Pure hypercholesterolemia, unspecified: Secondary | ICD-10-CM

## 2016-06-29 HISTORY — DX: Pure hypercholesterolemia, unspecified: E78.00

## 2016-07-27 DIAGNOSIS — E039 Hypothyroidism, unspecified: Secondary | ICD-10-CM | POA: Diagnosis not present

## 2016-07-27 DIAGNOSIS — J0101 Acute recurrent maxillary sinusitis: Secondary | ICD-10-CM | POA: Diagnosis not present

## 2016-08-02 DIAGNOSIS — F419 Anxiety disorder, unspecified: Secondary | ICD-10-CM | POA: Diagnosis not present

## 2016-08-02 DIAGNOSIS — K6289 Other specified diseases of anus and rectum: Secondary | ICD-10-CM | POA: Diagnosis not present

## 2016-09-12 DIAGNOSIS — R32 Unspecified urinary incontinence: Secondary | ICD-10-CM | POA: Diagnosis not present

## 2016-09-12 DIAGNOSIS — F419 Anxiety disorder, unspecified: Secondary | ICD-10-CM | POA: Diagnosis not present

## 2016-09-12 DIAGNOSIS — E039 Hypothyroidism, unspecified: Secondary | ICD-10-CM | POA: Diagnosis not present

## 2016-09-12 DIAGNOSIS — E739 Lactose intolerance, unspecified: Secondary | ICD-10-CM

## 2016-09-12 DIAGNOSIS — R350 Frequency of micturition: Secondary | ICD-10-CM | POA: Diagnosis not present

## 2016-09-12 DIAGNOSIS — K6289 Other specified diseases of anus and rectum: Secondary | ICD-10-CM

## 2016-09-12 DIAGNOSIS — E785 Hyperlipidemia, unspecified: Secondary | ICD-10-CM

## 2016-09-12 HISTORY — DX: Other specified diseases of anus and rectum: K62.89

## 2016-09-12 HISTORY — DX: Hyperlipidemia, unspecified: E78.5

## 2016-09-12 HISTORY — DX: Lactose intolerance, unspecified: E73.9

## 2016-11-10 DIAGNOSIS — H2513 Age-related nuclear cataract, bilateral: Secondary | ICD-10-CM | POA: Diagnosis not present

## 2016-11-10 DIAGNOSIS — H524 Presbyopia: Secondary | ICD-10-CM | POA: Diagnosis not present

## 2016-11-10 DIAGNOSIS — H25013 Cortical age-related cataract, bilateral: Secondary | ICD-10-CM | POA: Diagnosis not present

## 2016-11-10 DIAGNOSIS — H5212 Myopia, left eye: Secondary | ICD-10-CM | POA: Diagnosis not present

## 2016-11-23 DIAGNOSIS — H2513 Age-related nuclear cataract, bilateral: Secondary | ICD-10-CM | POA: Diagnosis not present

## 2016-11-24 DIAGNOSIS — N3000 Acute cystitis without hematuria: Secondary | ICD-10-CM | POA: Diagnosis not present

## 2016-11-30 DIAGNOSIS — N9489 Other specified conditions associated with female genital organs and menstrual cycle: Secondary | ICD-10-CM

## 2016-11-30 HISTORY — DX: Other specified conditions associated with female genital organs and menstrual cycle: N94.89

## 2016-12-14 DIAGNOSIS — N9489 Other specified conditions associated with female genital organs and menstrual cycle: Secondary | ICD-10-CM | POA: Diagnosis not present

## 2016-12-14 DIAGNOSIS — K529 Noninfective gastroenteritis and colitis, unspecified: Secondary | ICD-10-CM | POA: Diagnosis not present

## 2016-12-14 DIAGNOSIS — E739 Lactose intolerance, unspecified: Secondary | ICD-10-CM

## 2016-12-14 HISTORY — DX: Lactose intolerance, unspecified: E73.9

## 2016-12-26 HISTORY — PX: BREAST REDUCTION SURGERY: SHX8

## 2016-12-28 DIAGNOSIS — H2511 Age-related nuclear cataract, right eye: Secondary | ICD-10-CM | POA: Diagnosis not present

## 2016-12-28 DIAGNOSIS — H2512 Age-related nuclear cataract, left eye: Secondary | ICD-10-CM | POA: Diagnosis not present

## 2017-01-04 DIAGNOSIS — H2511 Age-related nuclear cataract, right eye: Secondary | ICD-10-CM | POA: Diagnosis not present

## 2017-01-23 DIAGNOSIS — Z803 Family history of malignant neoplasm of breast: Secondary | ICD-10-CM | POA: Diagnosis not present

## 2017-01-23 DIAGNOSIS — Z1231 Encounter for screening mammogram for malignant neoplasm of breast: Secondary | ICD-10-CM | POA: Diagnosis not present

## 2017-02-16 ENCOUNTER — Emergency Department (HOSPITAL_COMMUNITY)
Admission: EM | Admit: 2017-02-16 | Discharge: 2017-02-16 | Disposition: A | Discharge status: Home / Self Care | Payer: PPO | Attending: Emergency Medicine | Admitting: Emergency Medicine

## 2017-02-16 ENCOUNTER — Encounter (HOSPITAL_COMMUNITY): Payer: Self-pay

## 2017-02-16 ENCOUNTER — Emergency Department (HOSPITAL_COMMUNITY): Payer: PPO

## 2017-02-16 DIAGNOSIS — E039 Hypothyroidism, unspecified: Secondary | ICD-10-CM | POA: Insufficient documentation

## 2017-02-16 DIAGNOSIS — R109 Unspecified abdominal pain: Secondary | ICD-10-CM | POA: Diagnosis not present

## 2017-02-16 DIAGNOSIS — Z7982 Long term (current) use of aspirin: Secondary | ICD-10-CM | POA: Diagnosis not present

## 2017-02-16 DIAGNOSIS — N200 Calculus of kidney: Secondary | ICD-10-CM | POA: Diagnosis not present

## 2017-02-16 DIAGNOSIS — R1110 Vomiting, unspecified: Secondary | ICD-10-CM | POA: Diagnosis not present

## 2017-02-16 DIAGNOSIS — N132 Hydronephrosis with renal and ureteral calculous obstruction: Secondary | ICD-10-CM | POA: Insufficient documentation

## 2017-02-16 DIAGNOSIS — R10814 Left lower quadrant abdominal tenderness: Secondary | ICD-10-CM | POA: Diagnosis not present

## 2017-02-16 DIAGNOSIS — N189 Chronic kidney disease, unspecified: Secondary | ICD-10-CM | POA: Insufficient documentation

## 2017-02-16 DIAGNOSIS — K2970 Gastritis, unspecified, without bleeding: Secondary | ICD-10-CM | POA: Diagnosis not present

## 2017-02-16 LAB — CBC WITH DIFFERENTIAL/PLATELET
Basophils Absolute: 0 10*3/uL (ref 0.0–0.1)
Basophils Relative: 0 %
Eosinophils Absolute: 0 10*3/uL (ref 0.0–0.7)
Eosinophils Relative: 0 %
HCT: 34.9 % — ABNORMAL LOW (ref 36.0–46.0)
HEMOGLOBIN: 11.8 g/dL — AB (ref 12.0–15.0)
LYMPHS ABS: 1.1 10*3/uL (ref 0.7–4.0)
LYMPHS PCT: 11 %
MCH: 28.6 pg (ref 26.0–34.0)
MCHC: 33.8 g/dL (ref 30.0–36.0)
MCV: 84.5 fL (ref 78.0–100.0)
Monocytes Absolute: 0.4 10*3/uL (ref 0.1–1.0)
Monocytes Relative: 4 %
NEUTROS ABS: 8.6 10*3/uL — AB (ref 1.7–7.7)
NEUTROS PCT: 85 %
Platelets: 185 10*3/uL (ref 150–400)
RBC: 4.13 MIL/uL (ref 3.87–5.11)
RDW: 12.9 % (ref 11.5–15.5)
WBC: 10.1 10*3/uL (ref 4.0–10.5)

## 2017-02-16 LAB — COMPREHENSIVE METABOLIC PANEL
ALK PHOS: 33 U/L — AB (ref 38–126)
ALT: 20 U/L (ref 14–54)
AST: 19 U/L (ref 15–41)
Albumin: 3.8 g/dL (ref 3.5–5.0)
Anion gap: 6 (ref 5–15)
BUN: 28 mg/dL — AB (ref 6–20)
CALCIUM: 9.2 mg/dL (ref 8.9–10.3)
CO2: 26 mmol/L (ref 22–32)
CREATININE: 0.55 mg/dL (ref 0.44–1.00)
Chloride: 103 mmol/L (ref 101–111)
Glucose, Bld: 113 mg/dL — ABNORMAL HIGH (ref 65–99)
Potassium: 3.7 mmol/L (ref 3.5–5.1)
Sodium: 135 mmol/L (ref 135–145)
Total Bilirubin: 0.5 mg/dL (ref 0.3–1.2)
Total Protein: 6 g/dL — ABNORMAL LOW (ref 6.5–8.1)

## 2017-02-16 LAB — LIPASE, BLOOD: Lipase: 19 U/L (ref 11–51)

## 2017-02-16 LAB — URINALYSIS, ROUTINE W REFLEX MICROSCOPIC
Bilirubin Urine: NEGATIVE
GLUCOSE, UA: NEGATIVE mg/dL
KETONES UR: 20 mg/dL — AB
Leukocytes, UA: NEGATIVE
Nitrite: NEGATIVE
PROTEIN: 30 mg/dL — AB
Specific Gravity, Urine: 1.025 (ref 1.005–1.030)
pH: 5 (ref 5.0–8.0)

## 2017-02-16 MED ORDER — MORPHINE SULFATE (PF) 4 MG/ML IV SOLN
4.0000 mg | Freq: Once | INTRAVENOUS | Status: AC
  Administered 2017-02-16: 4 mg via INTRAVENOUS
  Filled 2017-02-16: qty 1

## 2017-02-16 MED ORDER — ONDANSETRON HCL 4 MG PO TABS: 4.0000 mg | ORAL_TABLET | Freq: Three times a day (TID) | ORAL | 0 refills | Status: DC | PRN

## 2017-02-16 MED ORDER — TAMSULOSIN HCL 0.4 MG PO CAPS: 0.4000 mg | ORAL_CAPSULE | Freq: Every day | ORAL | 0 refills | Status: DC

## 2017-02-16 MED ORDER — OXYCODONE-ACETAMINOPHEN 5-325 MG PO TABS: 1.0000 | ORAL_TABLET | Freq: Four times a day (QID) | ORAL | 0 refills | Status: DC | PRN

## 2017-02-16 NOTE — Discharge Instructions (Signed)
You have a 71mm kidney stone in the left side that is causing pain.  Please use a urine strainer when urinate to capture the stone and follow up with urologist for further care.  You also have several kidney stones in both kidneys not causing any symptoms at this time.

## 2017-02-16 NOTE — ED Notes (Signed)
Bed: WA03 Expected date:  Expected time:  Means of arrival:  Comments: EMS 70yo, abd pain

## 2017-02-16 NOTE — ED Provider Notes (Signed)
[place] DEPT Provider Note   CSN: [id number] Arrival date & time: 02/16/17  0827     History   Chief Complaint Chief Complaint  Patient presents with  . Flank Pain  . Abdominal Pain    HPI Wendy Joyce is a 71 y.o. female.  HPI   71 year old female with history of kidney stone, IBS, GERD, hypothyroidism presenting for evaluation of abdominal pain. Patient reports she was awoke around 3 AM this morning with acute onset of left-sided abdominal pain and flank pain. She described pain as a sharp stabbing sensation, radiates towards her mid abdomen, nothing seems to make it better or worse and she could not find a comfortable position. She felt lightheadedness, nauseous, vomited due to the severity of the pain. She initially thought it was related to IBS, she tries giving himself an enema and had a bowel movement but it did not relieve the pain. She denies any associated fever, chills, headache, chest pain, shortness of breath, productive cough, hemoptysis, dysuria, hematuria, vaginal bleeding, vaginal discharge, hematochezia or melena. EMS was contacted and patient received fentanyl prior to arrival which has controlled her pain. At this time she reported minimal abdominal pain. She has had prior abdominal surgery including hernia repair, appendectomy, and tummy tuck.  Remote history of kidney stones which she was able to pass.  Past Medical History:  Diagnosis Date  . Anxiety   . Arthritis    hands  . Chronic kidney disease 2012   kidney stones  . GERD (gastroesophageal reflux disease)   . Headache(784.0)   . High cholesterol   . Hypothyroidism     Patient Active Problem List   Diagnosis Date Noted  . 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 ABDOMINAL HYSTERECTOMY     partial  . COLONOSCOPY WITH PROPOFOL N/A 02/25/2013   Procedure: COLONOSCOPY WITH PROPOFOL;  Surgeon: Garlan Fair, MD;  Location: [facility];   Service: Endoscopy;  Laterality: N/A;  . NASAL SINUS SURGERY    . ROTATOR CUFF REPAIR     Right  . ROTATOR CUFF REPAIR     Left  . TUBAL LIGATION    . umbicial hernia      OB History    No data available       Home Medications    Prior to Admission medications   Medication Sig Start Date End Date Taking? Authorizing Provider  acyclovir ointment (ZOVIRAX) 5 % Apply 1 application topically daily as needed (for fever blister).    Historical Provider, MD  aspirin EC 81 MG tablet Take 162 mg by mouth daily.    Historical Provider, MD  carboxymethylcellulose (REFRESH TEARS) 0.5 % SOLN Place 1 drop into both eyes daily as needed (for dry eyes).    Historical Provider, MD  cetirizine (ZYRTEC) 10 MG tablet Take 10 mg by mouth daily.    Historical Provider, MD  clobetasol (OLUX) 0.05 % topical foam Apply 1 application topically daily as needed (for flares).    Historical Provider, MD  Coenzyme Q10 (CO Q 10) 100 MG CAPS Take 100 mg by mouth at bedtime.    Historical Provider, MD  cyclobenzaprine (FLEXERIL) 10 MG tablet Take 10 mg by mouth as needed for muscle spasms.    Historical Provider, MD  Esomeprazole Magnesium (NEXIUM PO) Take 44.6 mg by mouth daily.    Historical Provider, MD  Esterified Estrogens (MENEST) 0.3 MG tablet Take 0.3 mg by mouth.  Historical Provider, MD  hydrochlorothiazide (MICROZIDE) 12.5 MG capsule Take 12.5 mg by mouth daily.    Historical Provider, MD  levothyroxine (SYNTHROID, LEVOTHROID) 125 MCG tablet Take 1 tablet (125 mcg total) by mouth daily. 04/11/14   Philemon Kingdom, MD  LORazepam (ATIVAN) 1 MG tablet Take 0.5 mg by mouth at bedtime as needed for anxiety.    Historical Provider, MD  metroNIDAZOLE (METROGEL) 1 % gel Apply 1 application topically daily. For rosacea    Historical Provider, MD  Pseudoephedrine-Guaifenesin [facility] D PO) Take 320 mg by mouth as needed.    Historical Provider, MD  rosuvastatin (CRESTOR) 10 MG tablet Take 5 mg by mouth at  bedtime.    Historical Provider, MD  tretinoin (RETIN-A) 0.025 % cream Apply 1 application topically at bedtime as needed (for skin).    Historical Provider, MD  valACYclovir (VALTREX) 500 MG tablet Take 250 mg by mouth 3 (three) times a week.    Historical Provider, MD  zaleplon (SONATA) 10 MG capsule Take 10 mg by mouth at bedtime.    Historical Provider, MD    Family History No family history on file.  Social History Social History  Substance Use Topics  . Smoking status: Never Smoker  . Smokeless tobacco: Not on file  . Alcohol use 0.6 oz/week    1 Glasses of wine per week     Comment: every night     Allergies   Adhesive [tape]; Amoxicillin; Citrus; and Niacin and related   Review of Systems Review of Systems  All other systems reviewed and are negative.    Physical Exam Updated Vital Signs BP 112/78 (BP Location: Right Arm)   Pulse 63   Temp 97.5 F (36.4 C) (Oral)   Resp 16   Ht 5\' 4"  (1.626 m)   Wt 59 kg   SpO2 100%   BMI 22.31 kg/m   Physical Exam  Constitutional: She appears well-developed and well-nourished. No distress.  HENT:  Head: Atraumatic.  Eyes: Conjunctivae are normal.  Neck: Neck supple.  Cardiovascular: Normal rate and regular rhythm.   Pulmonary/Chest: Effort normal and breath sounds normal.  Abdominal: Soft. She exhibits no distension. There is tenderness (Mild suprapubic tenderness without guarding rebound tenderness).  Left CVA tenderness on percussion.  Neurological: She is alert.  Skin: No rash noted.  Psychiatric: She has a normal mood and affect.  Nursing note and vitals reviewed.    ED Treatments / Results  Labs (all labs ordered are listed, but only abnormal results are displayed) Labs Reviewed - No data to display  EKG  EKG Interpretation None       Radiology No results found.  Procedures Procedures (including critical care time)  Medications Ordered in ED Medications - No data to display   Initial  Impression / Assessment and Plan / ED Course  I have reviewed the triage vital signs and the nursing notes.  Pertinent labs & imaging results that were available during my care of the patient were reviewed by me and considered in my medical decision making (see chart for details).     BP (!) 105/54 (BP Location: Right Arm)   Pulse (!) 57   Temp 97.5 F (36.4 C) (Oral)   Resp 16   Ht 5\' 4"  (1.626 m)   Wt 59 kg   SpO2 100%   BMI 22.31 kg/m    Final Clinical Impressions(s) / ED Diagnoses   Final diagnoses:  Kidney stone on left side    New  Prescriptions Discharge Medication List as of 02/16/2017 11:09 AM    START taking these medications   Details  ondansetron (ZOFRAN) 4 MG tablet Take 1 tablet (4 mg total) by mouth every 8 (eight) hours as needed for nausea or vomiting., Starting Thu 02/16/2017, Print    oxyCODONE-acetaminophen (PERCOCET/ROXICET) 5-325 MG tablet Take 1 tablet by mouth every 6 (six) hours as needed for severe pain., Starting Thu 02/16/2017, Print    tamsulosin (FLOMAX) 0.4 MG CAPS capsule Take 1 capsule (0.4 mg total) by mouth daily., Starting Thu 02/16/2017, Print       9:04 AM Patient here with acute onset of left flank pain with positive CVA tenderness on exam suggestive of a kidney stone. Her pain is currently controlled. Will obtain CT scan and further workup.  9:56 AM Renal stone CT study demonstrated a 3 mm calculus on the left UVJ with moderate hydronephrosis consistence with patient's presenting complaint. Patient also has several nonobstructing calculi in each kidney. No other concerning feature.  10:52 AM Currently awaits urinalysis.  When pt received IV morphine she developed localize reaction to the IV site.  Has hx of adhesive allergy as well.  NO other systemic changes.  Cool compress applied to skin.  Anticipate discharge once UA result.    Domenic Moras, PA-C 02/16/17 [place], MD 03/05/17 [phone]

## 2017-02-16 NOTE — ED Triage Notes (Addendum)
Pt c/o LLQ abdominal pain since 0300.  Pt thought it was her IBS that she's had sx of x 2 weeks and did an enema at 0500 but didn't have much relief.  States the pain was sharp to LLQ radiating to LT flank.  EMS gave fentanyl 124mcg and pain is now a 0/10.  States a decrease in urinary output since this has happened but denies any other urinary sx's.

## 2017-02-17 DIAGNOSIS — N201 Calculus of ureter: Secondary | ICD-10-CM | POA: Diagnosis not present

## 2017-02-17 DIAGNOSIS — N3946 Mixed incontinence: Secondary | ICD-10-CM | POA: Diagnosis not present

## 2017-02-22 DIAGNOSIS — Z780 Asymptomatic menopausal state: Secondary | ICD-10-CM | POA: Diagnosis not present

## 2017-02-22 DIAGNOSIS — M8589 Other specified disorders of bone density and structure, multiple sites: Secondary | ICD-10-CM | POA: Diagnosis not present

## 2017-03-02 DIAGNOSIS — M8580 Other specified disorders of bone density and structure, unspecified site: Secondary | ICD-10-CM

## 2017-03-02 HISTORY — DX: Other specified disorders of bone density and structure, unspecified site: M85.80

## 2017-04-13 DIAGNOSIS — Z0001 Encounter for general adult medical examination with abnormal findings: Secondary | ICD-10-CM | POA: Diagnosis not present

## 2017-04-13 DIAGNOSIS — Z1389 Encounter for screening for other disorder: Secondary | ICD-10-CM | POA: Diagnosis not present

## 2017-04-13 DIAGNOSIS — E782 Mixed hyperlipidemia: Secondary | ICD-10-CM | POA: Diagnosis not present

## 2017-04-13 DIAGNOSIS — E559 Vitamin D deficiency, unspecified: Secondary | ICD-10-CM | POA: Diagnosis not present

## 2017-04-13 DIAGNOSIS — Z79899 Other long term (current) drug therapy: Secondary | ICD-10-CM | POA: Diagnosis not present

## 2017-04-13 DIAGNOSIS — G43119 Migraine with aura, intractable, without status migrainosus: Secondary | ICD-10-CM | POA: Diagnosis not present

## 2017-04-13 DIAGNOSIS — M797 Fibromyalgia: Secondary | ICD-10-CM | POA: Diagnosis not present

## 2017-04-13 DIAGNOSIS — I10 Essential (primary) hypertension: Secondary | ICD-10-CM | POA: Diagnosis not present

## 2017-04-13 DIAGNOSIS — E039 Hypothyroidism, unspecified: Secondary | ICD-10-CM | POA: Diagnosis not present

## 2017-04-13 DIAGNOSIS — F419 Anxiety disorder, unspecified: Secondary | ICD-10-CM | POA: Diagnosis not present

## 2017-04-13 DIAGNOSIS — J309 Allergic rhinitis, unspecified: Secondary | ICD-10-CM | POA: Diagnosis not present

## 2017-05-25 DIAGNOSIS — Z961 Presence of intraocular lens: Secondary | ICD-10-CM | POA: Diagnosis not present

## 2017-07-06 DIAGNOSIS — L719 Rosacea, unspecified: Secondary | ICD-10-CM | POA: Diagnosis not present

## 2017-07-06 DIAGNOSIS — L564 Polymorphous light eruption: Secondary | ICD-10-CM | POA: Diagnosis not present

## 2017-07-27 DIAGNOSIS — R0609 Other forms of dyspnea: Secondary | ICD-10-CM | POA: Diagnosis not present

## 2017-07-27 DIAGNOSIS — E039 Hypothyroidism, unspecified: Secondary | ICD-10-CM | POA: Diagnosis not present

## 2017-07-27 DIAGNOSIS — E782 Mixed hyperlipidemia: Secondary | ICD-10-CM | POA: Diagnosis not present

## 2017-07-27 DIAGNOSIS — M797 Fibromyalgia: Secondary | ICD-10-CM | POA: Diagnosis not present

## 2017-07-27 DIAGNOSIS — J309 Allergic rhinitis, unspecified: Secondary | ICD-10-CM | POA: Diagnosis not present

## 2017-07-27 DIAGNOSIS — I10 Essential (primary) hypertension: Secondary | ICD-10-CM | POA: Diagnosis not present

## 2017-07-27 DIAGNOSIS — E559 Vitamin D deficiency, unspecified: Secondary | ICD-10-CM | POA: Diagnosis not present

## 2017-07-27 DIAGNOSIS — F419 Anxiety disorder, unspecified: Secondary | ICD-10-CM | POA: Diagnosis not present

## 2017-07-27 DIAGNOSIS — Z79899 Other long term (current) drug therapy: Secondary | ICD-10-CM | POA: Diagnosis not present

## 2017-07-27 DIAGNOSIS — G43119 Migraine with aura, intractable, without status migrainosus: Secondary | ICD-10-CM | POA: Diagnosis not present

## 2017-07-27 DIAGNOSIS — K1379 Other lesions of oral mucosa: Secondary | ICD-10-CM | POA: Diagnosis not present

## 2017-08-15 DIAGNOSIS — S83241A Other tear of medial meniscus, current injury, right knee, initial encounter: Secondary | ICD-10-CM | POA: Diagnosis not present

## 2017-09-07 DIAGNOSIS — M722 Plantar fascial fibromatosis: Secondary | ICD-10-CM | POA: Diagnosis not present

## 2017-09-07 DIAGNOSIS — S83241D Other tear of medial meniscus, current injury, right knee, subsequent encounter: Secondary | ICD-10-CM | POA: Diagnosis not present

## 2017-09-21 DIAGNOSIS — M9901 Segmental and somatic dysfunction of cervical region: Secondary | ICD-10-CM | POA: Diagnosis not present

## 2017-09-21 DIAGNOSIS — M542 Cervicalgia: Secondary | ICD-10-CM | POA: Diagnosis not present

## 2017-09-21 DIAGNOSIS — M9902 Segmental and somatic dysfunction of thoracic region: Secondary | ICD-10-CM | POA: Diagnosis not present

## 2017-09-21 DIAGNOSIS — M546 Pain in thoracic spine: Secondary | ICD-10-CM | POA: Diagnosis not present

## 2017-09-22 DIAGNOSIS — M9902 Segmental and somatic dysfunction of thoracic region: Secondary | ICD-10-CM | POA: Diagnosis not present

## 2017-09-22 DIAGNOSIS — M9901 Segmental and somatic dysfunction of cervical region: Secondary | ICD-10-CM | POA: Diagnosis not present

## 2017-09-22 DIAGNOSIS — M546 Pain in thoracic spine: Secondary | ICD-10-CM | POA: Diagnosis not present

## 2017-09-22 DIAGNOSIS — M542 Cervicalgia: Secondary | ICD-10-CM | POA: Diagnosis not present

## 2017-09-24 DIAGNOSIS — E039 Hypothyroidism, unspecified: Secondary | ICD-10-CM | POA: Diagnosis not present

## 2017-09-24 DIAGNOSIS — F329 Major depressive disorder, single episode, unspecified: Secondary | ICD-10-CM | POA: Diagnosis not present

## 2017-09-24 DIAGNOSIS — I10 Essential (primary) hypertension: Secondary | ICD-10-CM | POA: Diagnosis not present

## 2017-09-24 DIAGNOSIS — M169 Osteoarthritis of hip, unspecified: Secondary | ICD-10-CM | POA: Diagnosis not present

## 2017-09-24 DIAGNOSIS — E782 Mixed hyperlipidemia: Secondary | ICD-10-CM | POA: Diagnosis not present

## 2017-09-25 DIAGNOSIS — M542 Cervicalgia: Secondary | ICD-10-CM | POA: Diagnosis not present

## 2017-09-25 DIAGNOSIS — M546 Pain in thoracic spine: Secondary | ICD-10-CM | POA: Diagnosis not present

## 2017-09-25 DIAGNOSIS — M9902 Segmental and somatic dysfunction of thoracic region: Secondary | ICD-10-CM | POA: Diagnosis not present

## 2017-09-25 DIAGNOSIS — M9901 Segmental and somatic dysfunction of cervical region: Secondary | ICD-10-CM | POA: Diagnosis not present

## 2017-09-26 DIAGNOSIS — M9902 Segmental and somatic dysfunction of thoracic region: Secondary | ICD-10-CM | POA: Diagnosis not present

## 2017-09-26 DIAGNOSIS — M9901 Segmental and somatic dysfunction of cervical region: Secondary | ICD-10-CM | POA: Diagnosis not present

## 2017-09-26 DIAGNOSIS — M546 Pain in thoracic spine: Secondary | ICD-10-CM | POA: Diagnosis not present

## 2017-09-26 DIAGNOSIS — M542 Cervicalgia: Secondary | ICD-10-CM | POA: Diagnosis not present

## 2017-09-28 DIAGNOSIS — M9901 Segmental and somatic dysfunction of cervical region: Secondary | ICD-10-CM | POA: Diagnosis not present

## 2017-09-28 DIAGNOSIS — M542 Cervicalgia: Secondary | ICD-10-CM | POA: Diagnosis not present

## 2017-09-28 DIAGNOSIS — M546 Pain in thoracic spine: Secondary | ICD-10-CM | POA: Diagnosis not present

## 2017-09-28 DIAGNOSIS — M9902 Segmental and somatic dysfunction of thoracic region: Secondary | ICD-10-CM | POA: Diagnosis not present

## 2017-10-02 DIAGNOSIS — M9901 Segmental and somatic dysfunction of cervical region: Secondary | ICD-10-CM | POA: Diagnosis not present

## 2017-10-02 DIAGNOSIS — M546 Pain in thoracic spine: Secondary | ICD-10-CM | POA: Diagnosis not present

## 2017-10-02 DIAGNOSIS — M542 Cervicalgia: Secondary | ICD-10-CM | POA: Diagnosis not present

## 2017-10-02 DIAGNOSIS — M9902 Segmental and somatic dysfunction of thoracic region: Secondary | ICD-10-CM | POA: Diagnosis not present

## 2017-10-16 DIAGNOSIS — M9901 Segmental and somatic dysfunction of cervical region: Secondary | ICD-10-CM | POA: Diagnosis not present

## 2017-10-16 DIAGNOSIS — M9902 Segmental and somatic dysfunction of thoracic region: Secondary | ICD-10-CM | POA: Diagnosis not present

## 2017-10-16 DIAGNOSIS — M546 Pain in thoracic spine: Secondary | ICD-10-CM | POA: Diagnosis not present

## 2017-10-16 DIAGNOSIS — M542 Cervicalgia: Secondary | ICD-10-CM | POA: Diagnosis not present

## 2017-10-18 DIAGNOSIS — M9902 Segmental and somatic dysfunction of thoracic region: Secondary | ICD-10-CM | POA: Diagnosis not present

## 2017-10-18 DIAGNOSIS — L308 Other specified dermatitis: Secondary | ICD-10-CM | POA: Diagnosis not present

## 2017-10-18 DIAGNOSIS — M542 Cervicalgia: Secondary | ICD-10-CM | POA: Diagnosis not present

## 2017-10-18 DIAGNOSIS — M546 Pain in thoracic spine: Secondary | ICD-10-CM | POA: Diagnosis not present

## 2017-10-18 DIAGNOSIS — Z23 Encounter for immunization: Secondary | ICD-10-CM | POA: Diagnosis not present

## 2017-10-18 DIAGNOSIS — L309 Dermatitis, unspecified: Secondary | ICD-10-CM | POA: Diagnosis not present

## 2017-10-18 DIAGNOSIS — M9901 Segmental and somatic dysfunction of cervical region: Secondary | ICD-10-CM | POA: Diagnosis not present

## 2017-10-19 DIAGNOSIS — C44612 Basal cell carcinoma of skin of right upper limb, including shoulder: Secondary | ICD-10-CM | POA: Diagnosis not present

## 2017-10-23 DIAGNOSIS — M546 Pain in thoracic spine: Secondary | ICD-10-CM | POA: Diagnosis not present

## 2017-10-23 DIAGNOSIS — M9902 Segmental and somatic dysfunction of thoracic region: Secondary | ICD-10-CM | POA: Diagnosis not present

## 2017-10-23 DIAGNOSIS — M9901 Segmental and somatic dysfunction of cervical region: Secondary | ICD-10-CM | POA: Diagnosis not present

## 2017-10-23 DIAGNOSIS — M542 Cervicalgia: Secondary | ICD-10-CM | POA: Diagnosis not present

## 2017-10-25 DIAGNOSIS — M546 Pain in thoracic spine: Secondary | ICD-10-CM | POA: Diagnosis not present

## 2017-10-25 DIAGNOSIS — M9901 Segmental and somatic dysfunction of cervical region: Secondary | ICD-10-CM | POA: Diagnosis not present

## 2017-10-25 DIAGNOSIS — M9902 Segmental and somatic dysfunction of thoracic region: Secondary | ICD-10-CM | POA: Diagnosis not present

## 2017-10-25 DIAGNOSIS — M542 Cervicalgia: Secondary | ICD-10-CM | POA: Diagnosis not present

## 2017-10-27 DIAGNOSIS — M542 Cervicalgia: Secondary | ICD-10-CM | POA: Diagnosis not present

## 2017-10-27 DIAGNOSIS — M9902 Segmental and somatic dysfunction of thoracic region: Secondary | ICD-10-CM | POA: Diagnosis not present

## 2017-10-27 DIAGNOSIS — M9901 Segmental and somatic dysfunction of cervical region: Secondary | ICD-10-CM | POA: Diagnosis not present

## 2017-10-27 DIAGNOSIS — M546 Pain in thoracic spine: Secondary | ICD-10-CM | POA: Diagnosis not present

## 2017-10-30 DIAGNOSIS — M542 Cervicalgia: Secondary | ICD-10-CM | POA: Diagnosis not present

## 2017-10-30 DIAGNOSIS — M9901 Segmental and somatic dysfunction of cervical region: Secondary | ICD-10-CM | POA: Diagnosis not present

## 2017-10-30 DIAGNOSIS — M9902 Segmental and somatic dysfunction of thoracic region: Secondary | ICD-10-CM | POA: Diagnosis not present

## 2017-10-30 DIAGNOSIS — M546 Pain in thoracic spine: Secondary | ICD-10-CM | POA: Diagnosis not present

## 2017-11-01 DIAGNOSIS — M542 Cervicalgia: Secondary | ICD-10-CM | POA: Diagnosis not present

## 2017-11-01 DIAGNOSIS — M9902 Segmental and somatic dysfunction of thoracic region: Secondary | ICD-10-CM | POA: Diagnosis not present

## 2017-11-01 DIAGNOSIS — M546 Pain in thoracic spine: Secondary | ICD-10-CM | POA: Diagnosis not present

## 2017-11-01 DIAGNOSIS — M9901 Segmental and somatic dysfunction of cervical region: Secondary | ICD-10-CM | POA: Diagnosis not present

## 2017-11-10 DIAGNOSIS — E785 Hyperlipidemia, unspecified: Secondary | ICD-10-CM | POA: Diagnosis not present

## 2017-11-10 DIAGNOSIS — F329 Major depressive disorder, single episode, unspecified: Secondary | ICD-10-CM | POA: Diagnosis not present

## 2017-11-10 DIAGNOSIS — M169 Osteoarthritis of hip, unspecified: Secondary | ICD-10-CM | POA: Diagnosis not present

## 2017-11-10 DIAGNOSIS — E039 Hypothyroidism, unspecified: Secondary | ICD-10-CM | POA: Diagnosis not present

## 2017-11-10 DIAGNOSIS — E782 Mixed hyperlipidemia: Secondary | ICD-10-CM | POA: Diagnosis not present

## 2017-11-10 DIAGNOSIS — I10 Essential (primary) hypertension: Secondary | ICD-10-CM | POA: Diagnosis not present

## 2018-01-29 DIAGNOSIS — Z1231 Encounter for screening mammogram for malignant neoplasm of breast: Secondary | ICD-10-CM | POA: Diagnosis not present

## 2018-05-02 DIAGNOSIS — R6882 Decreased libido: Secondary | ICD-10-CM

## 2018-05-02 DIAGNOSIS — F419 Anxiety disorder, unspecified: Secondary | ICD-10-CM | POA: Diagnosis not present

## 2018-05-02 DIAGNOSIS — Z0000 Encounter for general adult medical examination without abnormal findings: Secondary | ICD-10-CM | POA: Diagnosis not present

## 2018-05-02 DIAGNOSIS — Z8249 Family history of ischemic heart disease and other diseases of the circulatory system: Secondary | ICD-10-CM

## 2018-05-02 DIAGNOSIS — I10 Essential (primary) hypertension: Secondary | ICD-10-CM | POA: Diagnosis not present

## 2018-05-02 DIAGNOSIS — E039 Hypothyroidism, unspecified: Secondary | ICD-10-CM | POA: Diagnosis not present

## 2018-05-02 DIAGNOSIS — Z79899 Other long term (current) drug therapy: Secondary | ICD-10-CM | POA: Diagnosis not present

## 2018-05-02 DIAGNOSIS — K589 Irritable 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: Secondary | ICD-10-CM

## 2018-05-02 DIAGNOSIS — F322 Major depressive disorder, single episode, severe without psychotic features: Secondary | ICD-10-CM | POA: Diagnosis not present

## 2018-05-02 DIAGNOSIS — E782 Mixed hyperlipidemia: Secondary | ICD-10-CM | POA: Diagnosis not present

## 2018-05-02 DIAGNOSIS — R1033 Periumbilical pain: Secondary | ICD-10-CM

## 2018-05-02 DIAGNOSIS — Z1389 Encounter for screening for other disorder: Secondary | ICD-10-CM | POA: Diagnosis not present

## 2018-05-02 DIAGNOSIS — I444 Left anterior fascicular block: Secondary | ICD-10-CM

## 2018-05-02 HISTORY — DX: Periumbilical pain: R10.33

## 2018-05-02 HISTORY — DX: Left anterior fascicular block: I44.4

## 2018-05-02 HISTORY — DX: Family history of ischemic heart disease and other diseases of the circulatory system: Z82.49

## 2018-05-02 HISTORY — DX: Irritable bowel syndrome, unspecified: K58.9

## 2018-05-02 HISTORY — DX: Decreased libido: R68.82

## 2018-05-14 DIAGNOSIS — Z961 Presence of intraocular lens: Secondary | ICD-10-CM | POA: Diagnosis not present

## 2018-06-01 DIAGNOSIS — I10 Essential (primary) hypertension: Secondary | ICD-10-CM | POA: Diagnosis not present

## 2018-06-01 DIAGNOSIS — F329 Major depressive disorder, single episode, unspecified: Secondary | ICD-10-CM | POA: Diagnosis not present

## 2018-06-01 DIAGNOSIS — M169 Osteoarthritis of hip, unspecified: Secondary | ICD-10-CM | POA: Diagnosis not present

## 2018-06-01 DIAGNOSIS — E039 Hypothyroidism, unspecified: Secondary | ICD-10-CM | POA: Diagnosis not present

## 2018-06-01 DIAGNOSIS — E782 Mixed hyperlipidemia: Secondary | ICD-10-CM | POA: Diagnosis not present

## 2018-06-18 DIAGNOSIS — R198 Other specified symptoms and signs involving the digestive system and abdomen: Secondary | ICD-10-CM | POA: Diagnosis not present

## 2018-06-18 DIAGNOSIS — R1033 Periumbilical pain: Secondary | ICD-10-CM | POA: Diagnosis not present

## 2018-06-20 DIAGNOSIS — K582 Mixed irritable bowel syndrome: Secondary | ICD-10-CM | POA: Diagnosis not present

## 2018-06-21 ENCOUNTER — Other Ambulatory Visit: Payer: Self-pay | Admitting: Surgery

## 2018-06-21 DIAGNOSIS — R198 Other specified symptoms and signs involving the digestive system and abdomen: Secondary | ICD-10-CM

## 2018-06-21 DIAGNOSIS — R1033 Periumbilical pain: Secondary | ICD-10-CM

## 2018-07-05 ENCOUNTER — Ambulatory Visit
Admission: RE | Admit: 2018-07-05 | Discharge: 2018-07-05 | Disposition: A | Discharge status: Home / Self Care | Payer: PPO | Source: Ambulatory Visit | Attending: Surgery | Admitting: Surgery

## 2018-07-05 DIAGNOSIS — R1033 Periumbilical pain: Secondary | ICD-10-CM

## 2018-07-05 DIAGNOSIS — R198 Other specified symptoms and signs involving the digestive system and abdomen: Secondary | ICD-10-CM

## 2018-07-05 DIAGNOSIS — K429 Umbilical hernia without obstruction or gangrene: Secondary | ICD-10-CM | POA: Diagnosis not present

## 2018-07-05 MED ORDER — IOHEXOL 300 MG/ML  SOLN
100.0000 mL | Freq: Once | INTRAMUSCULAR | Status: AC | PRN
  Administered 2018-07-05: 100 mL via INTRAVENOUS

## 2018-07-12 ENCOUNTER — Ambulatory Visit: Payer: PPO | Admitting: Cardiology

## 2018-07-19 DIAGNOSIS — K582 Mixed irritable bowel syndrome: Secondary | ICD-10-CM | POA: Diagnosis not present

## 2018-07-24 ENCOUNTER — Encounter: Payer: Self-pay | Admitting: Cardiology

## 2018-07-24 ENCOUNTER — Ambulatory Visit: Payer: PPO | Admitting: Cardiology

## 2018-07-24 VITALS — BP 108/72 | HR 69 | Ht 64.0 in | Wt 135.4 lb

## 2018-07-24 DIAGNOSIS — R0602 Shortness of breath: Secondary | ICD-10-CM

## 2018-07-24 DIAGNOSIS — R0789 Other chest pain: Secondary | ICD-10-CM

## 2018-07-24 DIAGNOSIS — Z8249 Family history of ischemic heart disease and other diseases of the circulatory system: Secondary | ICD-10-CM | POA: Diagnosis not present

## 2018-07-24 DIAGNOSIS — R0600 Dyspnea, unspecified: Secondary | ICD-10-CM

## 2018-07-24 DIAGNOSIS — R0609 Other forms of dyspnea: Secondary | ICD-10-CM | POA: Diagnosis not present

## 2018-07-24 DIAGNOSIS — R079 Chest pain, unspecified: Secondary | ICD-10-CM

## 2018-07-24 MED ORDER — METOPROLOL TARTRATE 50 MG PO TABS: ORAL_TABLET | ORAL | 0 refills | Status: DC

## 2018-07-24 NOTE — Progress Notes (Signed)
Cardiology Office Note:    Date:  07/24/2018   ID:  RANE DUMM, DOB 01-21-1946, MRN [id number]  PCP:  Josetta Huddle, MD  Cardiologist:  No primary care provider on file.   Referring MD: Josetta Huddle, MD     History of Present Illness:    Wendy Joyce is a 72 y.o. female being referred for the evaluation of dyspnea on exertion and early family history of coronary artery disease at the request of Dr. Inda Merlin.  In review of Dr. Geraldo Docker note from 05/02/2018 she has a history of left anterior fascicular block and dyspnea on exertion and she is concerned about the possibility of coronary artery disease.  She has had some other issues such as irritable bowel syndrome, chronic pain anxiety  Summer of 2018 in [place], [place], climbing hill. Could not catch her breath. Head was stopped up and chest tight.  Works out on [employer] now with trainer 30 minutes without stopping. Likes to dance but starts to tightenen.    Her father died at age 35 but had a long-standing history of CAD, had 3v CABG, CVA in 2009. Mother had angina.  Non-smoker.  TSH was low and free T4 was high suggestive of too much thyroid hormone  Back in 2005 she had a negative work-up for CAD.  Past Medical History:  Diagnosis Date  . Abdominal pain, generalized 01/29/2015  . Abnormal thyroid function test 02/25/2016  . ADHD (attention deficit hyperactivity disorder)   . Allergic rhinitis, unspecified 01/29/2015  . Ankle sprain 03/16/2016  . Anxiety 01/29/2015  . Arthritis    hands  . Chronic kidney disease 2012   kidney stones  . Constipation 01/29/2015  . Decreased libido 05/02/2018  . Disorder of the skin and subcutaneous tissue, unspecified 01/29/2015  . DJD (degenerative joint disease)    hand  . Dysthymia   . Endometriosis   . External otitis   . Family history of coronary artery disease 05/02/2018  . Fatigue due to excessive exertion 02/24/2016  . Fever blister   . Fibromyalgia 01/29/2015   . Frequency of micturition 01/29/2015  . Functional dyspepsia 01/29/2015  . Gastro-esophageal reflux disease without esophagitis 01/29/2015  . Headache(784.0)   . High cholesterol   . Hyperlipidemia 09/12/2016  . Hypersomnia   . Hypothyroidism   . IBS (irritable bowel syndrome) 05/02/2018  . Insomnia   . Internal hemorrhoids   . 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 Labial pain 11/30/2016  . Lactose intolerance 12/14/2016  . Lactose intolerance in adult 09/12/2016  . 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 Left ureteral stone   . LP (lichen planus) 49/70/2637  . Major depressive disorder, single episode, unspecified   . Migraine with aura, not intractable, without status migrainosus   . Mixed hyperlipidemia   . Mouth pain 01/07/2016  . Osteoarthritis of hip   . Osteopenia determined by x-ray 03/02/2017  . Personal history of estrogen therapy   . Phlebitis and thrombophlebitis of the leg   . Pityriasis alba 01/29/2015  . Pure hypercholesterolemia 06/29/2016  . Rectum pain 09/12/2016  . Recurrent sinus infections 02/24/2016  . Rhinosinusitis 01/07/2016  . Rosacea   . Sinusitis 12/24/2015  . 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 Umbilical hernia without obstruction and without gangrene 01/29/2015  . Umbilical pain 85/88/5027  . Unspecified urinary incontinence 01/07/2016  . Urticaria   . UTI (urinary tract infection)   . 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  Vitamin D deficiency     Past Surgical History:  Procedure Laterality Date  . ABDOMINAL HYSTERECTOMY     partial  . BREAST ENHANCEMENT SURGERY    . BREAST REDUCTION SURGERY Bilateral 2018  . COLONOSCOPY WITH PROPOFOL N/A 02/25/2013   Procedure: COLONOSCOPY WITH PROPOFOL;  Surgeon: Garlan Fair, MD;  Location: [facility];  Service: Endoscopy;  Laterality: N/A;  . mini facelift  2000  . NASAL SINUS SURGERY    . ROTATOR CUFF REPAIR     Right  .  ROTATOR CUFF REPAIR     Left  . SEPTOPLASTY WITH ETHMOIDECTOMY, AND MAXILLARY ANTROSTOMY Bilateral 2006  . TOTAL ABDOMINAL HYSTERECTOMY    . TUBAL LIGATION    . umbicial hernia      Current Medications: Current Meds  Medication Sig  . acyclovir ointment (ZOVIRAX) 5 % Apply 1 application topically daily as needed (for fever blister).  . cetirizine (ZYRTEC) 10 MG tablet Take 10 mg by mouth daily.  . Cholecalciferol (VITAMIN D3) 1000 units CAPS Take 1 capsule by mouth daily.  . clonazePAM (KLONOPIN) 1 MG tablet Take 1.5 tablets by mouth daily as needed.  . diclofenac sodium (VOLTAREN) 1 % GEL Apply 2 g topically 4 (four) times daily.  . Estrad-Estriol-Testost-Progest (BI-EST PROGEST-TESTOSTERONE TD) Place onto the skin as directed.  Marland Kitchen estradiol (ESTRACE) 1 MG tablet Take 1 tablet by mouth daily.  Marland Kitchen Ketotifen Fumarate (ALLERGY EYE DROPS OP) Apply 1 drop to eye daily as needed (Allergies).  Marland Kitchen levothyroxine (SYNTHROID, LEVOTHROID) 125 MCG tablet Take 1 tablet (125 mcg total) by mouth daily.  . metroNIDAZOLE (METROGEL) 1 % gel Apply 1 application topically daily. For rosacea  . Multiple Vitamin (MULTIVITAMIN WITH MINERALS) TABS tablet Take 1 tablet by mouth daily.  Marland Kitchen tretinoin (RETIN-A) 0.025 % cream Apply 1 application topically at bedtime as needed (for skin).  Marland Kitchen triamcinolone cream (KENALOG) 0.5 % Apply 1 application topically daily as needed (Nose irritation).   . valACYclovir (VALTREX) 500 MG tablet Take 250 mg by mouth 2 (two) times daily as needed (Out breaks Fever blisters).   . zaleplon (SONATA) 10 MG capsule Take 10 mg by mouth at bedtime as needed for sleep.      Allergies:   Adhesive [tape]; Citrus; Escitalopram oxalate; Niacin and related; and Amoxicillin   Social History   Socioeconomic History  . Marital status: Married    Spouse name: Not on file  . Number of children: Not on file  . Years of education: Not on file  . Highest education level: Not on file  Occupational  History  . Not on file  Social Needs  . Financial resource strain: Not on file  . Food insecurity:    Worry: Not on file    Inability: Not on file  . Transportation needs:    Medical: Not on file    Non-medical: Not on file  Tobacco Use  . Smoking status: Never Smoker  . Smokeless tobacco: Never Used  Substance and Sexual Activity  . Alcohol use: Yes    Alcohol/week: 0.6 oz    Types: 1 Glasses of wine per week    Comment: every night  . Drug use: No  . Sexual activity: Yes    Partners: Male  Lifestyle  . Physical activity:    Days per week: Not on file    Minutes per session: Not on file  . Stress: Not on file  Relationships  . Social connections:    Talks on phone:  Not on file    Gets together: Not on file    Attends religious service: Not on file    Active member of club or organization: Not on file    Attends meetings of clubs or organizations: Not on file    Relationship status: Not on file  Other Topics Concern  . Not on file  Social History Narrative   Married   Regular exercise: yes; 2 x a week with a trainer   Caffeine use: coffee daily     Family History: The patient's father had coronary artery disease and died at age 46  ROS:   Please see the history of present illness.     All other systems reviewed and are negative.  EKGs/Labs/Other Studies Reviewed:    The following studies were reviewed today: Prior office note, lab work, EKG  EKG:  EKG is ordered today.  The ekg ordered today demonstrates 07/24/2018-sinus rhythm LAFB 69 no other abnormalities personally viewed  Recent Labs: No results found for requested labs within last 8760 hours.  Recent Lipid Panel No results found for: CHOL, TRIG, HDL, CHOLHDL, VLDL, LDLCALC, LDLDIRECT   Total cholesterol 242, HDL 65, LDL 160, triglycerides 83, hemoglobin 13.2, creatinine 0.4, potassium 4.1, ALT 15, platelets 185, TSH 0.02  Physical Exam:    VS:  BP 108/72   Pulse 69   Ht 5\' 4"  (1.626 m)   Wt 135  lb 6.4 oz (61.4 kg)   BMI 23.24 kg/m     Wt Readings from Last 3 Encounters:  07/24/18 135 lb 6.4 oz (61.4 kg)  02/16/17 130 lb (59 kg)  03/27/14 124 lb (56.2 kg)     GEN:  Well nourished, well developed in no acute distress HEENT: Normal NECK: No JVD; No carotid bruits LYMPHATICS: No lymphadenopathy CARDIAC: RRR, soft systolic murmur, no rubs, gallops RESPIRATORY:  Clear to auscultation without rales, wheezing or rhonchi  ABDOMEN: Soft, non-tender, non-distended MUSCULOSKELETAL:  No edema; No deformity  SKIN: Warm and dry NEUROLOGIC:  Alert and oriented x 3 PSYCHIATRIC:  Normal affect   ASSESSMENT:    1. Dyspnea on exertion   2. Family history of early CAD   3. SOB (shortness of breath)   4. Chest tightness    PLAN:    In order of problems listed above:  Dyspnea on exertion chest tightness difficulty getting in breath abnormal EKG with left anterior fascicular block in the setting of family history of coronary artery disease - This may be an anginal equivalent, she is anxious about this.  Previously in 2005 she had a work-up which was negative for CAD.   - We will go ahead and check a coronary CT scan with possible FFR to ensure that she is not have any flow-limiting coronary artery disease. -I will also check an echocardiogram to ensure proper structure and function of her heart. - Continue to monitor left anterior fascicular block with ECG.  Hopefully this will be of no clinical concern in the future.   Medication Adjustments/Labs and Tests Ordered: Current medicines are reviewed at length with the patient today.  Concerns regarding medicines are outlined above.  Orders Placed This Encounter  Procedures  . CT CORONARY MORPH W/CTA COR W/SCORE W/CA W/CM &/OR WO/CM  . CT CORONARY FRACTIONAL FLOW RESERVE DATA PREP  . CT CORONARY FRACTIONAL FLOW RESERVE FLUID ANALYSIS  . Basic Metabolic Panel (BMET)  . ECHOCARDIOGRAM COMPLETE   Meds ordered this encounter  Medications   . metoprolol tartrate (LOPRESSOR) 50  MG tablet    Sig: Take 1 tablet, 1 hour before your cardiac CT    Dispense:  1 tablet    Refill:  0    Patient Instructions  Medication Instructions:  Take 1 tablet of Metoprolol, 1 hr before your Cardiac CT  Labwork: Future: BMET before Cardiac CT  Testing/Procedures: Your physician has requested that you have an echocardiogram. Echocardiography is a painless test that uses sound waves to create images of your heart. It provides your doctor with information about the size and shape of your heart and how well your heart's chambers and valves are working. This procedure takes approximately one hour. There are no restrictions for this procedure.  Your physician has requested that you have cardiac CT. Cardiac computed tomography (CT) is a painless test that uses an x-ray machine to take clear, detailed pictures of your heart. For further information please visit HugeFiesta.[email or web address]. Please follow instruction sheet as given.   Follow-Up: As needed Please arrive at the [facility] main entrance of [facility] at               AM (30-45 minutes prior to test start time)  [facility] [place], [place] [address] [phone]  Proceed to the [facility] Radiology Department (First Floor).  Please follow these instructions carefully (unless otherwise directed):  On the Night Before the Test: . Drink plenty of water. . Do not consume any caffeinated/decaffeinated beverages or chocolate 12 hours prior to your test. . Do not take any antihistamines 12 hours prior to your test  On the Day of the Test: . Drink plenty of water. Do not drink any water within one hour of the test. . Do not eat any food 4 hours prior to the test. . You may take your regular medications prior to the test. . IF NOT ON A BETA BLOCKER - Take 50 mg of lopressor (metoprolol) one hour before the test. . HOLD Furosemide morning of the  test.  After the Test: . Drink plenty of water. . After receiving IV contrast, you may experience a mild flushed feeling. This is normal. . On occasion, you may experience a mild rash up to 24 hours after the test. This is not dangerous. If this occurs, you can take Benadryl 25 mg and increase your fluid intake. . If you experience trouble breathing, this can be serious. If it is severe call 911 IMMEDIATELY. If it is mild, please call our office. . If you take any of these medications: Glipizide/Metformin, Avandament, Glucavance, please do not take 48 hours after completing test.    If you need a refill on your cardiac medications before your next appointment, please call your pharmacy.      Signed, Candee Furbish, MD  07/24/2018 3:27 PM    [place]

## 2018-07-24 NOTE — Patient Instructions (Signed)
Medication Instructions:  Take 1 tablet of Metoprolol, 1 hr before your Cardiac CT  Labwork: Future: BMET before Cardiac CT  Testing/Procedures: Your physician has requested that you have an echocardiogram. Echocardiography is a painless test that uses sound waves to create images of your heart. It provides your doctor with information about the size and shape of your heart and how well your heart's chambers and valves are working. This procedure takes approximately one hour. There are no restrictions for this procedure.  Your physician has requested that you have cardiac CT. Cardiac computed tomography (CT) is a painless test that uses an x-ray machine to take clear, detailed pictures of your heart. For further information please visit HugeFiesta.[email or web address]. Please follow instruction sheet as given.   Follow-Up: As needed Please arrive at the [facility] main entrance of [facility] at               AM (30-45 minutes prior to test start time)  [facility] [place], [place] [address] [phone]  Proceed to the [facility] Radiology Department (First Floor).  Please follow these instructions carefully (unless otherwise directed):  On the Night Before the Test: . Drink plenty of water. . Do not consume any caffeinated/decaffeinated beverages or chocolate 12 hours prior to your test. . Do not take any antihistamines 12 hours prior to your test  On the Day of the Test: . Drink plenty of water. Do not drink any water within one hour of the test. . Do not eat any food 4 hours prior to the test. . You may take your regular medications prior to the test. . IF NOT ON A BETA BLOCKER - Take 50 mg of lopressor (metoprolol) one hour before the test. . HOLD Furosemide morning of the test.  After the Test: . Drink plenty of water. . After receiving IV contrast, you may experience a mild flushed feeling. This is normal. . On occasion, you may experience a  mild rash up to 24 hours after the test. This is not dangerous. If this occurs, you can take Benadryl 25 mg and increase your fluid intake. . If you experience trouble breathing, this can be serious. If it is severe call 911 IMMEDIATELY. If it is mild, please call our office. . If you take any of these medications: Glipizide/Metformin, Avandament, Glucavance, please do not take 48 hours after completing test.    If you need a refill on your cardiac medications before your next appointment, please call your pharmacy.

## 2018-07-25 NOTE — Addendum Note (Signed)
Addended by: Mady Haagensen on: 07/25/2018 03:05 PM   Modules accepted: Orders

## 2018-07-26 ENCOUNTER — Other Ambulatory Visit: Payer: Self-pay

## 2018-07-26 ENCOUNTER — Ambulatory Visit (HOSPITAL_COMMUNITY): Payer: PPO | Attending: Cardiology

## 2018-07-26 DIAGNOSIS — R0609 Other forms of dyspnea: Secondary | ICD-10-CM | POA: Diagnosis not present

## 2018-07-26 DIAGNOSIS — R0602 Shortness of breath: Secondary | ICD-10-CM

## 2018-07-26 DIAGNOSIS — R0789 Other chest pain: Secondary | ICD-10-CM

## 2018-07-26 DIAGNOSIS — R0600 Dyspnea, unspecified: Secondary | ICD-10-CM

## 2018-07-26 DIAGNOSIS — Z8249 Family history of ischemic heart disease and other diseases of the circulatory system: Secondary | ICD-10-CM | POA: Diagnosis not present

## 2018-08-02 ENCOUNTER — Other Ambulatory Visit (HOSPITAL_COMMUNITY): Payer: PPO

## 2018-08-13 ENCOUNTER — Other Ambulatory Visit: Payer: PPO | Admitting: *Deleted

## 2018-08-13 DIAGNOSIS — R0789 Other chest pain: Secondary | ICD-10-CM

## 2018-08-13 DIAGNOSIS — Z8249 Family history of ischemic heart disease and other diseases of the circulatory system: Secondary | ICD-10-CM | POA: Diagnosis not present

## 2018-08-13 DIAGNOSIS — R0602 Shortness of breath: Secondary | ICD-10-CM | POA: Diagnosis not present

## 2018-08-13 DIAGNOSIS — R0609 Other forms of dyspnea: Principal | ICD-10-CM

## 2018-08-13 DIAGNOSIS — R0600 Dyspnea, unspecified: Secondary | ICD-10-CM

## 2018-08-13 DIAGNOSIS — K582 Mixed irritable bowel syndrome: Secondary | ICD-10-CM | POA: Diagnosis not present

## 2018-08-13 DIAGNOSIS — J329 Chronic sinusitis, unspecified: Secondary | ICD-10-CM | POA: Diagnosis not present

## 2018-08-13 LAB — BASIC METABOLIC PANEL
BUN / CREAT RATIO: 18 (ref 12–28)
BUN: 10 mg/dL (ref 8–27)
CALCIUM: 9.1 mg/dL (ref 8.7–10.3)
CHLORIDE: 100 mmol/L (ref 96–106)
CO2: 23 mmol/L (ref 20–29)
CREATININE: 0.55 mg/dL — AB (ref 0.57–1.00)
GFR calc Af Amer: 111 mL/min/{1.73_m2} (ref >59)
GFR calc non Af Amer: 96 mL/min/{1.73_m2} (ref >59)
GLUCOSE: 88 mg/dL (ref 65–99)
Potassium: 4.5 mmol/L (ref 3.5–5.2)
Sodium: 136 mmol/L (ref 134–144)

## 2018-09-05 ENCOUNTER — Ambulatory Visit (HOSPITAL_COMMUNITY): Admission: RE | Admit: 2018-09-05 | Payer: PPO | Source: Ambulatory Visit

## 2018-09-05 ENCOUNTER — Ambulatory Visit (HOSPITAL_COMMUNITY)
Admission: RE | Admit: 2018-09-05 | Discharge: 2018-09-05 | Disposition: A | Discharge status: Home / Self Care | Payer: PPO | Source: Ambulatory Visit | Attending: Cardiology | Admitting: Cardiology

## 2018-09-05 DIAGNOSIS — R0602 Shortness of breath: Secondary | ICD-10-CM

## 2018-09-05 DIAGNOSIS — Z8249 Family history of ischemic heart disease and other diseases of the circulatory system: Secondary | ICD-10-CM | POA: Diagnosis not present

## 2018-09-05 DIAGNOSIS — I2510 Atherosclerotic heart disease of native coronary artery without angina pectoris: Secondary | ICD-10-CM | POA: Insufficient documentation

## 2018-09-05 DIAGNOSIS — R072 Precordial pain: Secondary | ICD-10-CM | POA: Diagnosis not present

## 2018-09-05 DIAGNOSIS — R0789 Other chest pain: Secondary | ICD-10-CM

## 2018-09-05 MED ORDER — IOPAMIDOL (ISOVUE-370) INJECTION 76%
125.0000 mL | Freq: Once | INTRAVENOUS | Status: AC | PRN
  Administered 2018-09-05: 80 mL via INTRAVENOUS

## 2018-09-05 MED ORDER — NITROGLYCERIN 0.4 MG SL SUBL
0.8000 mg | SUBLINGUAL_TABLET | Freq: Once | SUBLINGUAL | Status: AC
  Administered 2018-09-05: 0.8 mg via SUBLINGUAL
  Filled 2018-09-05: qty 25

## 2018-09-05 MED ORDER — NITROGLYCERIN 0.4 MG SL SUBL
SUBLINGUAL_TABLET | SUBLINGUAL | Status: AC
  Filled 2018-09-05: qty 2

## 2018-09-10 ENCOUNTER — Ambulatory Visit (HOSPITAL_COMMUNITY)
Admission: RE | Admit: 2018-09-10 | Discharge: 2018-09-10 | Disposition: A | Discharge status: Home / Self Care | Payer: PPO | Source: Ambulatory Visit | Attending: Cardiology | Admitting: Cardiology

## 2018-09-10 DIAGNOSIS — R072 Precordial pain: Secondary | ICD-10-CM | POA: Diagnosis not present

## 2018-09-10 DIAGNOSIS — I2510 Atherosclerotic heart disease of native coronary artery without angina pectoris: Secondary | ICD-10-CM | POA: Insufficient documentation

## 2018-09-10 DIAGNOSIS — R079 Chest pain, unspecified: Secondary | ICD-10-CM | POA: Diagnosis not present

## 2018-09-10 DIAGNOSIS — R0602 Shortness of breath: Secondary | ICD-10-CM | POA: Diagnosis not present

## 2018-09-10 NOTE — Addendum Note (Signed)
Addended by: Dorothy Spark on: 09/10/2018 04:45 PM   Modules accepted: Orders

## 2018-09-12 ENCOUNTER — Telehealth: Payer: Self-pay | Admitting: Cardiology

## 2018-09-12 NOTE — Telephone Encounter (Signed)
New Message:    Patient calling about a CT Scan she and said some would be calling her. She has not heard from anyone.

## 2018-09-12 NOTE — Telephone Encounter (Signed)
Will forward to Dr Marlou Porch CT has not been reviewed ./cy

## 2018-09-13 NOTE — Telephone Encounter (Signed)
Notes recorded by Jerline Pain, MD on 09/13/2018 at 6:49 AM EDT There is coronary plaque in the LAD but this is not flow limiting. This should not be symptomatic.  Given evidence of CAD, recommend starting atorvastatin 40mg  PO QD. Check ALT and Lipids in 2 months.  Candee Furbish, MD

## 2018-09-13 NOTE — Telephone Encounter (Signed)
Pt aware of result and recommendation to start atorvastatin and have repeat lab work.  She reports having muscle pain while taking Crestor 10 mg every other day in the past and is concerned about taking a statin.  Advised I will review with Dr Marlou Porch and call back with further instruction.

## 2018-09-13 NOTE — Telephone Encounter (Signed)
Lets go ahead and try it.  I would like her to take co-Q10 100 mg once a day with this as well.  This may be able to help with her muscle aches.  If muscle aches become apparent, we will stop and try another formulation. Candee Furbish, MD

## 2018-09-13 NOTE — Telephone Encounter (Signed)
Left message for pt to call back to discuss recommendations/ orders

## 2018-09-13 NOTE — Telephone Encounter (Signed)
Results are now in. Please see. Starting statin. FFR neg.  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  Candee Furbish, MD

## 2018-09-14 MED ORDER — ATORVASTATIN CALCIUM 40 MG PO TABS: 40.0000 mg | ORAL_TABLET | Freq: Every day | ORAL | 3 refills | Status: DC

## 2018-09-14 MED ORDER — CO Q-10 100 MG PO CAPS: 100.0000 mg | ORAL_CAPSULE | Freq: Every day | ORAL | 0 refills | Status: DC

## 2018-09-14 NOTE — Telephone Encounter (Signed)
Spoke with patient.  Aware Dr Marlou Porch would like for her to try Atorvastatin 40 mg daily along with CoQ 10 - 100 mg daily.  She reports she will do this.  Rx sent into pharmacy requested.  She refused to have blood work completed here in 2 months as ordered stating she will have that done at her PCP office.  She will c/b should she have questions or concerns.

## 2018-09-14 NOTE — Addendum Note (Signed)
Addended by: Shellia Cleverly on: 09/14/2018 10:10 AM   Modules accepted: Orders

## 2018-09-17 ENCOUNTER — Telehealth: Payer: Self-pay | Admitting: Internal Medicine

## 2018-09-17 NOTE — Telephone Encounter (Signed)
Patient is requesting to see Dr. Hilarie Fredrickson.   Patient says that she feels that Dr. Therisa Doyne at [facility] didn't listen to her concerns regarding her problems with IBS.  Records placed on Dr. Vena Rua desk for review.

## 2018-09-19 DIAGNOSIS — F419 Anxiety disorder, unspecified: Secondary | ICD-10-CM | POA: Diagnosis not present

## 2018-09-19 DIAGNOSIS — R0609 Other forms of dyspnea: Secondary | ICD-10-CM | POA: Diagnosis not present

## 2018-09-19 DIAGNOSIS — R109 Unspecified abdominal pain: Secondary | ICD-10-CM | POA: Diagnosis not present

## 2018-09-19 DIAGNOSIS — I2510 Atherosclerotic heart disease of native coronary artery without angina pectoris: Secondary | ICD-10-CM | POA: Diagnosis not present

## 2018-09-19 DIAGNOSIS — E782 Mixed hyperlipidemia: Secondary | ICD-10-CM | POA: Diagnosis not present

## 2018-09-19 DIAGNOSIS — K589 Irritable 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: Secondary | ICD-10-CM | POA: Diagnosis not present

## 2018-10-03 ENCOUNTER — Telehealth: Payer: Self-pay | Admitting: Cardiology

## 2018-10-03 NOTE — Telephone Encounter (Signed)
   Primary Cardiologist: Candee Furbish, MD  Chart reviewed as part of pre-operative protocol coverage. Given past medical history and time since last visit, based on ACC/AHA guidelines, Gennett Garcia would be at acceptable risk for the planned procedure without further cardiovascular testing. Recent coronary CT FFR analysis didn't show any significant stenosis.  I will route this recommendation to the requesting party via Epic fax function and remove from pre-op pool.  Please call with questions.  [place], [place] 10/03/2018, 4:37 PM

## 2018-10-03 NOTE — Telephone Encounter (Signed)
Spoke with [employer] office, and verified that the pt will be having a  Tummy Tuck with excisions of her back.

## 2018-10-03 NOTE — Telephone Encounter (Signed)
Please let surgeon office know that without name of procedure we will not provide clearance.

## 2018-10-03 NOTE — Telephone Encounter (Signed)
° ° °  ° °  [facility] Pre-operative Risk Assessment    Request for surgical clearance:  1. What type of surgery is being performed? OFFICE STAFF DECLINED TO GIVE FORMAL NAME OF PROCEDURE, only would state 5 hour abdominal sugery  2. When is this surgery scheduled? 10/19/18  3. What type of clearance is required (medical clearance vs. Pharmacy clearance to hold med vs. Both)? BOTH  4. Are there any medications that need to be held prior to surgery and how long?   5. Practice name and name of physician performing surgery? [facility], DR SCHNEIDER  6. What is your office phone number [phone]   7.   What is your office fax number [phone]  8.   Anesthesia type (None, local, MAC, general) ? [place] 10/03/2018, 11:17 AM  _________________________________________________________________   (provider comments below)

## 2018-10-04 ENCOUNTER — Encounter: Payer: Self-pay | Admitting: Pulmonary Disease

## 2018-10-04 ENCOUNTER — Other Ambulatory Visit (INDEPENDENT_AMBULATORY_CARE_PROVIDER_SITE_OTHER): Payer: PPO

## 2018-10-04 ENCOUNTER — Ambulatory Visit (INDEPENDENT_AMBULATORY_CARE_PROVIDER_SITE_OTHER): Payer: PPO | Admitting: Pulmonary Disease

## 2018-10-04 VITALS — BP 114/78 | HR 71 | Ht 63.0 in | Wt 133.0 lb

## 2018-10-04 DIAGNOSIS — R0609 Other forms of dyspnea: Secondary | ICD-10-CM | POA: Diagnosis not present

## 2018-10-04 DIAGNOSIS — R0602 Shortness of breath: Secondary | ICD-10-CM

## 2018-10-04 DIAGNOSIS — J302 Other seasonal allergic rhinitis: Secondary | ICD-10-CM

## 2018-10-04 DIAGNOSIS — R0600 Dyspnea, unspecified: Secondary | ICD-10-CM

## 2018-10-04 LAB — CBC WITH DIFFERENTIAL/PLATELET
BASOS ABS: 0.1 10*3/uL (ref 0.0–0.1)
Basophils Relative: 2.1 % (ref 0.0–3.0)
EOS ABS: 0.1 10*3/uL (ref 0.0–0.7)
Eosinophils Relative: 2.4 % (ref 0.0–5.0)
HCT: 44.1 % (ref 36.0–46.0)
Hemoglobin: 14.8 g/dL (ref 12.0–15.0)
LYMPHS PCT: 40.4 % (ref 12.0–46.0)
Lymphs Abs: 2.3 10*3/uL (ref 0.7–4.0)
MCHC: 33.5 g/dL (ref 30.0–36.0)
MCV: 88.5 fl (ref 78.0–100.0)
Monocytes Absolute: 0.5 10*3/uL (ref 0.1–1.0)
Monocytes Relative: 8 % (ref 3.0–12.0)
NEUTROS ABS: 2.7 10*3/uL (ref 1.4–7.7)
Neutrophils Relative %: 47.1 % (ref 43.0–77.0)
PLATELETS: 213 10*3/uL (ref 150.0–400.0)
RBC: 4.99 Mil/uL (ref 3.87–5.11)
RDW: 14.1 % (ref 11.5–15.5)
WBC: 5.8 10*3/uL (ref 4.0–10.5)

## 2018-10-04 MED ORDER — ALBUTEROL SULFATE HFA 108 (90 BASE) MCG/ACT IN AERS: 2.0000 | INHALATION_SPRAY | Freq: Four times a day (QID) | RESPIRATORY_TRACT | 6 refills | Status: DC | PRN

## 2018-10-04 NOTE — Progress Notes (Signed)
Synopsis: Referred in October 2019 for dyspnea on exertion by Josetta Huddle, MD  Subjective:   PATIENT ID: Wendy Joyce GENDER: female DOB: 1946/04/05, MRN: [id number]  Chief Complaint  Patient presents with  . Consult    States she is having SOB with exertion. States she has been SOB  over last year. Denies chest discomfort and cough.     Past medical history of abdominal pain, IBS, fibromyalgia chronic pain, vitamin D deficiency presents with a complaint of was seen by cardiology in July 2019 when CT coronary FFR which showed no significant stenosis had a normal TTE with a preserved ejection fraction.  The patient states that she wants to be proactive. And she feels that she has a few episodes of SOB/DOE that she cant explain. She goes to the gym, exercises, regularly, rowing machine, yoga and pilates.   She had an episode while traveling to [place] that she had difficulty climbing a steep hill. Additionally, she was in [place] and developed SOB/DOE while walking up a steep hill.   She knows that she is allergic to cats, and spent the night at her sons who has a cat and an episode of feeling like she couldn't breath. Also, she has noticed he SOB is worse with dry heat. No wheezing. Not ever tried an inhaler before. No prior pulmonary function.   Use to have a dog in the home. She does have seasonal allergies. Usually worse in the spring and fall. Associated sinus congestion, and rhinitis. Prior allergy test +for dust allergies. Denies issues with cleaning supplies, or heavy scents, perfumes.     Past Medical History:  Diagnosis Date  . Abdominal pain, generalized 01/29/2015  . Abnormal thyroid function test 02/25/2016  . ADHD (attention deficit hyperactivity disorder)   . Allergic rhinitis, unspecified 01/29/2015  . Ankle sprain 03/16/2016  . Anxiety 01/29/2015  . Arthritis    hands  . Chronic kidney disease 2012   kidney stones  . Constipation 01/29/2015  .  Decreased libido 05/02/2018  . Disorder of the skin and subcutaneous tissue, unspecified 01/29/2015  . DJD (degenerative joint disease)    hand  . Dysthymia   . Endometriosis   . External otitis   . Family history of coronary artery disease 05/02/2018  . Fatigue due to excessive exertion 02/24/2016  . Fever blister   . Fibromyalgia 01/29/2015  . Frequency of micturition 01/29/2015  . Functional dyspepsia 01/29/2015  . Gastro-esophageal reflux disease without esophagitis 01/29/2015  . Headache(784.0)   . High cholesterol   . Hyperlipidemia 09/12/2016  . Hypersomnia   . Hypothyroidism   . IBS (irritable bowel syndrome) 05/02/2018  . Insomnia   . Internal hemorrhoids   . 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 Labial pain 11/30/2016  . Lactose intolerance 12/14/2016  . Lactose intolerance in adult 09/12/2016  . 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 Left ureteral stone   . LP (lichen planus) 20/25/4270  . Major depressive disorder, single episode, unspecified   . Migraine with aura, not intractable, without status migrainosus   . Mixed hyperlipidemia   . Mouth pain 01/07/2016  . Osteoarthritis of hip   . Osteopenia determined by x-ray 03/02/2017  . Personal history of estrogen therapy   . Phlebitis and thrombophlebitis of the leg   . Pityriasis alba 01/29/2015  . Pure hypercholesterolemia 06/29/2016  . Rectum pain 09/12/2016  . Recurrent sinus infections 02/24/2016  . Rhinosinusitis 01/07/2016  . Rosacea   . Sinusitis  12/24/2015  . 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 Umbilical hernia without obstruction and without gangrene 01/29/2015  . Umbilical pain 00/86/7619  . Unspecified urinary incontinence 01/07/2016  . Urticaria   . UTI (urinary tract infection)   . 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 Vitamin D deficiency      No family history on file.   Past Surgical History:  Procedure Laterality Date  .  ABDOMINAL HYSTERECTOMY     partial  . BREAST ENHANCEMENT SURGERY    . BREAST REDUCTION SURGERY Bilateral 2018  . COLONOSCOPY WITH PROPOFOL N/A 02/25/2013   Procedure: COLONOSCOPY WITH PROPOFOL;  Surgeon: Garlan Fair, MD;  Location: [facility];  Service: Endoscopy;  Laterality: N/A;  . mini facelift  2000  . NASAL SINUS SURGERY    . ROTATOR CUFF REPAIR     Right  . ROTATOR CUFF REPAIR     Left  . SEPTOPLASTY WITH ETHMOIDECTOMY, AND MAXILLARY ANTROSTOMY Bilateral 2006  . TOTAL ABDOMINAL HYSTERECTOMY    . TUBAL LIGATION    . umbicial hernia      Social History   Socioeconomic History  . Marital status: Married    Spouse name: Not on file  . Number of children: Not on file  . Years of education: Not on file  . Highest education level: Not on file  Occupational History  . Not on file  Social Needs  . Financial resource strain: Not on file  . Food insecurity:    Worry: Not on file    Inability: Not on file  . Transportation needs:    Medical: Not on file    Non-medical: Not on file  Tobacco Use  . Smoking status: Never Smoker  . Smokeless tobacco: Never Used  Substance and Sexual Activity  . Alcohol use: Yes    Alcohol/week: 1.0 standard drinks    Types: 1 Glasses of wine per week    Comment: every night  . Drug use: No  . Sexual activity: Yes    Partners: Male  Lifestyle  . Physical activity:    Days per week: Not on file    Minutes per session: Not on file  . Stress: Not on file  Relationships  . Social connections:    Talks on phone: Not on file    Gets together: Not on file    Attends religious service: Not on file    Active member of club or organization: Not on file    Attends meetings of clubs or organizations: Not on file    Relationship status: Not on file  . Intimate partner violence:    Fear of current or ex partner: Not on file    Emotionally abused: Not on file    Physically abused: Not on file    Forced sexual activity: Not on file  Other  Topics Concern  . Not on file  Social History Narrative   Married   Regular exercise: yes; 2 x a week with a trainer   Caffeine use: coffee daily     Allergies  Allergen Reactions  . Adhesive [Tape] Dermatitis  . Citrus Nausea And Vomiting  . Escitalopram Oxalate Hives  . Niacin And Related     Rash and breathing breathing problems  . Amoxicillin Rash    Unsure of reaction/ was instructed not to take drug. Unknown reaction      Outpatient Medications Prior to Visit  Medication Sig Dispense Refill  . acyclovir ointment (  ZOVIRAX) 5 % Apply 1 application topically daily as needed (for fever blister).    . cetirizine (ZYRTEC) 10 MG tablet Take 10 mg by mouth daily.    . Cholecalciferol (VITAMIN D3) 1000 units CAPS Take 1 capsule by mouth daily.    . clonazePAM (KLONOPIN) 1 MG tablet Take 1.5 tablets by mouth daily as needed.  2  . diclofenac sodium (VOLTAREN) 1 % GEL Apply 2 g topically 4 (four) times daily.    . Estrad-Estriol-Testost-Progest (BI-EST PROGEST-TESTOSTERONE TD) Place onto the skin as directed.    Marland Kitchen estradiol (ESTRACE) 1 MG tablet Take 1 tablet by mouth daily.  3  . Ketotifen Fumarate (ALLERGY EYE DROPS OP) Apply 1 drop to eye daily as needed (Allergies).    Marland Kitchen levothyroxine (SYNTHROID, LEVOTHROID) 112 MCG tablet Take 112 mcg by mouth daily before breakfast.    . metroNIDAZOLE (METROGEL) 1 % gel Apply 1 application topically daily. For rosacea    . Multiple Vitamin (MULTIVITAMIN WITH MINERALS) TABS tablet Take 1 tablet by mouth daily.    Marland Kitchen tretinoin (RETIN-A) 0.025 % cream Apply 1 application topically at bedtime as needed (for skin).    Marland Kitchen triamcinolone cream (KENALOG) 0.5 % Apply 1 application topically daily as needed (Nose irritation).     . valACYclovir (VALTREX) 500 MG tablet Take 250 mg by mouth 2 (two) times daily as needed (Out breaks Fever blisters).     . zaleplon (SONATA) 10 MG capsule Take 10 mg by mouth at bedtime as needed for sleep.     Marland Kitchen atorvastatin  (LIPITOR) 40 MG tablet Take 1 tablet (40 mg total) by mouth daily. (Patient not taking: Reported on 10/04/2018) 90 tablet 3  . Coenzyme Q10 (CO Q-10) 100 MG CAPS Take 100 mg by mouth daily. (Patient not taking: Reported on 10/04/2018)  0  . levothyroxine (SYNTHROID, LEVOTHROID) 125 MCG tablet Take 1 tablet (125 mcg total) by mouth daily. (Patient not taking: Reported on 10/04/2018) 90 tablet 3  . metoprolol tartrate (LOPRESSOR) 50 MG tablet Take 1 tablet, 1 hour before your cardiac CT (Patient not taking: Reported on 10/04/2018) 1 tablet 0   No facility-administered medications prior to visit.     Review of Systems  Constitutional: Negative.   HENT: Negative.   Eyes: Negative.   Respiratory: Positive for shortness of breath. Negative for cough, hemoptysis, sputum production and wheezing.   Cardiovascular: Negative.   Gastrointestinal: Negative.   Genitourinary: Negative.   Musculoskeletal: Negative.   Skin: Negative.   Neurological: Negative.   Endo/Heme/Allergies: Positive for environmental allergies. Negative for polydipsia. Does not bruise/bleed easily.  Psychiatric/Behavioral: Negative.      Objective:  Physical Exam  Constitutional: She is oriented to person, place, and time. She appears well-developed and well-nourished. No distress.  HENT:  Head: Normocephalic and atraumatic.  Mouth/Throat: Oropharynx is clear and moist.  Eyes: Pupils are equal, round, and reactive to light. Conjunctivae are normal. No scleral icterus.  Neck: Neck supple. No JVD present. No tracheal deviation present.  Cardiovascular: Normal rate, regular rhythm, normal heart sounds and intact distal pulses.  No murmur heard. Pulmonary/Chest: Effort normal and breath sounds normal. No accessory muscle usage or stridor. No tachypnea. No respiratory distress. She has no wheezes. She has no rhonchi. She has no rales.  Abdominal: Soft. Bowel sounds are normal. She exhibits no distension. There is no tenderness.    Musculoskeletal: She exhibits no edema or tenderness.  Lymphadenopathy:    She has no cervical adenopathy.  Neurological: She  is alert and oriented to person, place, and time.  Skin: Skin is warm and dry. Capillary refill takes less than 2 seconds. No rash noted.  Psychiatric: She has a normal mood and affect. Her behavior is normal.  Vitals reviewed.    Vitals:   10/04/18 1019  BP: 114/78  Pulse: 71  SpO2: 99%  Weight: 133 lb (60.3 kg)  Height: 5\' 3"  (1.6 m)   99% on RA BMI Readings from Last 3 Encounters:  10/04/18 23.56 kg/m  07/24/18 23.24 kg/m  02/16/17 22.31 kg/m   Wt Readings from Last 3 Encounters:  10/04/18 133 lb (60.3 kg)  07/24/18 135 lb 6.4 oz (61.4 kg)  02/16/17 130 lb (59 kg)     CBC    Component Value Date/Time   WBC 5.8 10/04/2018 1108   RBC 4.99 10/04/2018 1108   HGB 14.8 10/04/2018 1108   HCT 44.1 10/04/2018 1108   PLT 213.0 10/04/2018 1108   MCV 88.5 10/04/2018 1108   MCH 28.6 02/16/2017 0904   MCHC 33.5 10/04/2018 1108   RDW 14.1 10/04/2018 1108   LYMPHSABS 2.3 10/04/2018 1108   MONOABS 0.5 10/04/2018 1108   EOSABS 0.1 10/04/2018 1108   BASOSABS 0.1 10/04/2018 1108    Chest Imaging:  09/10/2018: CT coronary fractional flow reserve: No significant coronary stenosis noted  Pulmonary Functions Testing Results: None   FeNO: None   Pathology: None   Echocardiogram:  07/26/2018 Study Conclusions  - Left ventricle: The cavity size was normal. Systolic function was   normal. The estimated ejection fraction was in the range of 60%   to 65%. Wall motion was normal; there were no regional wall   motion abnormalities. Left ventricular diastolic function   parameters were normal. - Atrial septum: No defect or patent foramen ovale was identified. - Impressions: Normal GLS -24.1.  Heart Catheterization: None     Assessment & Plan:   DOE (dyspnea on exertion) - Plan: Pulmonary Function Test, CBC w/Diff, Resp Allergy Profile Regn2DC  DE [facility]  SOB (shortness of breath)  Seasonal allergies  Seasonal allergic rhinitis, unspecified trigger  Discussion:  This is a 72 year old female with symptoms of shortness of breath and dyspnea on exertion.  She does have a few features that are consistent with bronchial reactivity to include her seasonal allergies as well as allergic rhinitis in addition to symptoms of heat intolerance.  I am not completely convinced that her story is related to asthma however it is a possibility.  I believe we should start by treating the patient conservatively with an as needed albuterol inhaler.  She can use this prior to exercise or during any episodes of chest tightness and shortness of breath.  We will also obtain some blood work today to include a regional allergy panel and IgE level as well as absolute eosinophil count. Pending her allergy panel results as well as IgE level.  Would consider initiation of Singulair.  She can continue her current antihistamine over-the-counter regimen.    The patient is planned for an elective abdominal plastic surgery.  Abdominal plastic surgery is associated with a reduced FRC regarding lung function.  I suspect that she is low risk for any perioperative pulmonary related complications.  Return to clinic in 4 weeks with full PFTs.  Greater than 50% of this patient's 60-minute visit was spent face-to-face counseling the patient on the above treatment regimen and diagnostic plan.   Current Outpatient Medications:  .  acyclovir ointment (ZOVIRAX) 5 %,  Apply 1 application topically daily as needed (for fever blister)., Disp: , Rfl:  .  cetirizine (ZYRTEC) 10 MG tablet, Take 10 mg by mouth daily., Disp: , Rfl:  .  Cholecalciferol (VITAMIN D3) 1000 units CAPS, Take 1 capsule by mouth daily., Disp: , Rfl:  .  clonazePAM (KLONOPIN) 1 MG tablet, Take 1.5 tablets by mouth daily as needed., Disp: , Rfl: 2 .  diclofenac sodium (VOLTAREN) 1 % GEL, Apply 2 g topically  4 (four) times daily., Disp: , Rfl:  .  Estrad-Estriol-Testost-Progest (BI-EST PROGEST-TESTOSTERONE TD), Place onto the skin as directed., Disp: , Rfl:  .  estradiol (ESTRACE) 1 MG tablet, Take 1 tablet by mouth daily., Disp: , Rfl: 3 .  Ketotifen Fumarate (ALLERGY EYE DROPS OP), Apply 1 drop to eye daily as needed (Allergies)., Disp: , Rfl:  .  levothyroxine (SYNTHROID, LEVOTHROID) 112 MCG tablet, Take 112 mcg by mouth daily before breakfast., Disp: , Rfl:  .  metroNIDAZOLE (METROGEL) 1 % gel, Apply 1 application topically daily. For rosacea, Disp: , Rfl:  .  Multiple Vitamin (MULTIVITAMIN WITH MINERALS) TABS tablet, Take 1 tablet by mouth daily., Disp: , Rfl:  .  tretinoin (RETIN-A) 0.025 % cream, Apply 1 application topically at bedtime as needed (for skin)., Disp: , Rfl:  .  triamcinolone cream (KENALOG) 0.5 %, Apply 1 application topically daily as needed (Nose irritation). , Disp: , Rfl:  .  valACYclovir (VALTREX) 500 MG tablet, Take 250 mg by mouth 2 (two) times daily as needed (Out breaks Fever blisters). , Disp: , Rfl:  .  zaleplon (SONATA) 10 MG capsule, Take 10 mg by mouth at bedtime as needed for sleep. , Disp: , Rfl:  .  albuterol (PROVENTIL HFA;VENTOLIN HFA) 108 (90 Base) MCG/ACT inhaler, Inhale 2 puffs into the lungs every 6 (six) hours as needed for wheezing or shortness of breath., Disp: 1 Inhaler, Rfl: 6 .  atorvastatin (LIPITOR) 40 MG tablet, Take 1 tablet (40 mg total) by mouth daily. (Patient not taking: Reported on 10/04/2018), Disp: 90 tablet, Rfl: 3 .  Coenzyme Q10 (CO Q-10) 100 MG CAPS, Take 100 mg by mouth daily. (Patient not taking: Reported on 10/04/2018), Disp: , Rfl: 0   Garner Nash, DO [facility] 10/04/2018 12:31 PM

## 2018-10-04 NOTE — Patient Instructions (Addendum)
Thank you for visiting Dr. Valeta Harms at [facility]. Today we recommend the following: Orders Placed This Encounter  Procedures  . CBC w/Diff  . Resp Allergy Profile Regn2DC [place] [facility] [place] [facility]  . Pulmonary Function Test   Meds ordered this encounter  Medications  . albuterol (PROVENTIL HFA;VENTOLIN HFA) 108 (90 Base) MCG/ACT inhaler    Sig: Inhale 2 puffs into the lungs every 6 (six) hours as needed for wheezing or shortness of breath.    Dispense:  1 Inhaler    Refill:  6   Return in about 4 weeks (around 11/01/2018), or if symptoms worsen or fail to improve.

## 2018-10-05 LAB — RESPIRATORY ALLERGY PROFILE REGION II ~~LOC~~
Allergen, A. alternata, m6: <0.1 kU/L
Allergen, Cedar tree, t12: <0.1 kU/L
Allergen, Comm Silver Birch, t9: <0.1 kU/L
Allergen, Cottonwood, t14: <0.1 kU/L
Allergen, P. notatum, m1: <0.1 kU/L
Bermuda Grass: <0.1 kU/L
CLADOSPORIUM HERBARUM (M2) IGE: <0.1 kU/L
CLASS: 0
CLASS: 0
CLASS: 0
CLASS: 0
CLASS: 0
COMMON RAGWEED (SHORT) (W1) IGE: <0.1 kU/L
Cat Dander: <0.1 kU/L
Class: 0
Class: 0
Class: 0
Class: 0
Class: 0
Class: 0
Class: 0
Class: 0
Class: 0
Class: 0
Class: 0
Class: 0
Class: 0
Class: 0
Class: 0
Class: 0
Class: 0
Class: 0
Class: 0
Dog Dander: <0.1 kU/L
Elm IgE: <0.1 kU/L
IgE (Immunoglobulin E), Serum: 28 kU/L (ref <=114)
Johnson Grass: <0.1 kU/L
Pecan/Hickory Tree IgE: <0.1 kU/L

## 2018-10-05 LAB — INTERPRETATION:

## 2018-10-10 ENCOUNTER — Telehealth: Payer: Self-pay | Admitting: Physician Assistant

## 2018-10-10 NOTE — Telephone Encounter (Signed)
New Message          Wendy Joyce is calling from [facility],  she needs  the pre-op clearance and the signed EKG by provider  refaxed to  [phone]/HSVEXO [phone] ok to ask for [facility].

## 2018-10-10 NOTE — Telephone Encounter (Signed)
Returned [place] call from [employer].  I refaxed the clearance over and that she will need to send over a medical release form for any other records that she is needing. She agreed and thanked me for the return call.

## 2018-10-12 ENCOUNTER — Telehealth: Payer: Self-pay | Admitting: Cardiology

## 2018-10-12 NOTE — Telephone Encounter (Signed)
Dr. Marlou Porch, Clearance was sent by Leanor Kail, [place], on 10/03/18, but requesting provider is asking for clearance by MD. Can you please seen clearance to   5. Practice name and name of physician performing surgery? [facility], DR SCHNEIDER  6. What is your office phone number [phone]   7.   What is your office fax number [phone]

## 2018-10-12 NOTE — Telephone Encounter (Signed)
New Message    Wendy Joyce with [facility], states she received the surgical clearance but says she requested for an MD to sign but the PA signed instead. Also states she needs the EKG rhythm strips. Please call

## 2018-10-15 NOTE — Telephone Encounter (Signed)
Wendy Joyce has mild diffuse coronary artery disease on coronary CT scan, no flow limitations noted.  Her ejection fraction is normal on echocardiogram, reassuring.  She may proceed with surgery with low overall cardiac risk.  Please call if any questions.  Candee Furbish, MD

## 2018-10-16 NOTE — Telephone Encounter (Signed)
Clearance faxed to Dr Evelina Bucy at # listed below.

## 2018-10-18 ENCOUNTER — Ambulatory Visit: Payer: PPO | Admitting: Pulmonary Disease

## 2018-10-18 DIAGNOSIS — I2510 Atherosclerotic heart disease of native coronary artery without angina pectoris: Secondary | ICD-10-CM | POA: Diagnosis not present

## 2018-10-18 DIAGNOSIS — E782 Mixed hyperlipidemia: Secondary | ICD-10-CM | POA: Diagnosis not present

## 2018-10-18 DIAGNOSIS — F329 Major depressive disorder, single episode, unspecified: Secondary | ICD-10-CM | POA: Diagnosis not present

## 2018-10-18 DIAGNOSIS — E785 Hyperlipidemia, unspecified: Secondary | ICD-10-CM | POA: Diagnosis not present

## 2018-10-18 DIAGNOSIS — I10 Essential (primary) hypertension: Secondary | ICD-10-CM | POA: Diagnosis not present

## 2018-10-18 DIAGNOSIS — M169 Osteoarthritis of hip, unspecified: Secondary | ICD-10-CM | POA: Diagnosis not present

## 2018-10-18 DIAGNOSIS — E039 Hypothyroidism, unspecified: Secondary | ICD-10-CM | POA: Diagnosis not present

## 2018-10-26 ENCOUNTER — Telehealth: Payer: Self-pay | Admitting: Cardiology

## 2018-10-26 NOTE — Telephone Encounter (Signed)
Returned call to Dr Inda Merlin' office. They are trying to submit a prior authorization for Repatha but pt's insurance requires that this be in consultation with cardiology.  Pt has tried atorvastatin 40mg  daily, rosuvastatin 10mg  daily, and Vytorin in the past but experienced myalgias on these therapies. Her LDL is currently elevated at 160 (drawn at [facility] 05/02/18). She has a family history of premature CAD, as well as an elevated coronary calcium score of 6 (87th percentile when matched for age and sex). Mild diffuse CAD, moderate stenosis in the proximal LAD was noted. She would benefit from PCSK9i therapy to target LDL < 70.  Will fax over this note as well as recent calcium score to PCP to assist with approval process for PCSK9i therapy.

## 2018-10-26 NOTE — Telephone Encounter (Signed)
New Message          Dr. America Brown office is calling for prior auth. For "Repatha" need a statement faxed over to [phone] Attn. Tammy.

## 2018-10-30 ENCOUNTER — Ambulatory Visit: Payer: PPO | Admitting: Pulmonary Disease

## 2018-11-01 DIAGNOSIS — A09 Infectious gastroenteritis and colitis, unspecified: Secondary | ICD-10-CM | POA: Diagnosis not present

## 2018-11-01 DIAGNOSIS — A0100 Typhoid fever, unspecified: Secondary | ICD-10-CM | POA: Diagnosis not present

## 2018-11-01 DIAGNOSIS — Z23 Encounter for immunization: Secondary | ICD-10-CM | POA: Diagnosis not present

## 2018-11-01 DIAGNOSIS — B54 Unspecified malaria: Secondary | ICD-10-CM | POA: Diagnosis not present

## 2018-11-19 DIAGNOSIS — E039 Hypothyroidism, unspecified: Secondary | ICD-10-CM | POA: Diagnosis not present

## 2018-11-19 DIAGNOSIS — I10 Essential (primary) hypertension: Secondary | ICD-10-CM | POA: Diagnosis not present

## 2018-11-19 DIAGNOSIS — F329 Major depressive disorder, single episode, unspecified: Secondary | ICD-10-CM | POA: Diagnosis not present

## 2018-11-19 DIAGNOSIS — E782 Mixed hyperlipidemia: Secondary | ICD-10-CM | POA: Diagnosis not present

## 2018-11-19 DIAGNOSIS — E785 Hyperlipidemia, unspecified: Secondary | ICD-10-CM | POA: Diagnosis not present

## 2018-11-19 DIAGNOSIS — I2510 Atherosclerotic heart disease of native coronary artery without angina pectoris: Secondary | ICD-10-CM | POA: Diagnosis not present

## 2018-11-19 DIAGNOSIS — M169 Osteoarthritis of hip, unspecified: Secondary | ICD-10-CM | POA: Diagnosis not present

## 2018-11-19 NOTE — Telephone Encounter (Signed)
Dr. Hilarie Fredrickson reviewed records and accepted pt. On 10/08/18 we spoke with pt and she wanted appt in December. On 11/01/18 we left a msg on her phone to cb to sch for Dec.

## 2018-11-27 ENCOUNTER — Encounter: Payer: Self-pay | Admitting: Pulmonary Disease

## 2018-11-27 ENCOUNTER — Ambulatory Visit: Payer: PPO | Admitting: Pulmonary Disease

## 2018-11-27 ENCOUNTER — Ambulatory Visit (INDEPENDENT_AMBULATORY_CARE_PROVIDER_SITE_OTHER): Payer: PPO | Admitting: Pulmonary Disease

## 2018-11-27 VITALS — BP 118/76 | HR 74 | Ht 63.0 in | Wt 135.0 lb

## 2018-11-27 DIAGNOSIS — R0602 Shortness of breath: Secondary | ICD-10-CM

## 2018-11-27 DIAGNOSIS — R0609 Other forms of dyspnea: Secondary | ICD-10-CM

## 2018-11-27 DIAGNOSIS — J302 Other seasonal allergic rhinitis: Secondary | ICD-10-CM

## 2018-11-27 DIAGNOSIS — R0600 Dyspnea, unspecified: Secondary | ICD-10-CM

## 2018-11-27 LAB — PULMONARY FUNCTION TEST
DL/VA % pred: 86 %
DL/VA: 4.02 ml/min/mmHg/L
DLCO cor % pred: 74 %
DLCO cor: 17.1 ml/min/mmHg
DLCO unc % pred: 77 %
DLCO unc: 17.79 ml/min/mmHg
FEF 25-75 Post: 2.5 L/sec
FEF 25-75 Pre: 2.02 L/sec
FEF2575-%Change-Post: 23 %
FEF2575-%Pred-Post: 135 %
FEF2575-%Pred-Pre: 109 %
FEV1-%Change-Post: 4 %
FEV1-%Pred-Post: 101 %
FEV1-%Pred-Pre: 97 %
FEV1-Post: 2.21 L
FEV1-Pre: 2.11 L
FEV1FVC-%Change-Post: 4 %
FEV1FVC-%Pred-Pre: 105 %
FEV6-%Change-Post: 0 %
FEV6-%Pred-Post: 95 %
FEV6-%Pred-Pre: 96 %
FEV6-Post: 2.63 L
FEV6-Pre: 2.64 L
FEV6FVC-%Pred-Post: 104 %
FEV6FVC-%Pred-Pre: 104 %
FVC-%Change-Post: 0 %
FVC-%Pred-Post: 91 %
FVC-%Pred-Pre: 91 %
FVC-Post: 2.64 L
FVC-Pre: 2.64 L
POST FEV6/FVC RATIO: 100 %
PRE FEV1/FVC RATIO: 80 %
Post FEV1/FVC ratio: 84 %
Pre FEV6/FVC Ratio: 100 %
RV % pred: 92 %
RV: 1.98 L
TLC % PRED: 98 %
TLC: 4.81 L

## 2018-11-27 NOTE — Progress Notes (Signed)
PFT done today. 

## 2018-11-27 NOTE — Patient Instructions (Addendum)
Thank you for visiting Dr. Melissa Pulido at [facility]. Today we recommend the following:    Return if symptoms worsen or fail to improve.  

## 2018-11-27 NOTE — Progress Notes (Signed)
Synopsis: Referred in October 2019 for dyspnea on exertion by Josetta Huddle, MD  Subjective:   PATIENT ID: Wendy Joyce GENDER: female DOB: 11-Feb-1946, MRN: [id number]  Chief Complaint  Patient presents with  . Follow-up    PFT done. She recently had a umbilical hernia repair. States the SOB remains at her baseline. No new symptoms.     Past medical history of abdominal pain, IBS, fibromyalgia chronic pain, vitamin D deficiency presents with a complaint of was seen by cardiology in July 2019 when CT coronary FFR which showed no significant stenosis had a normal TTE with a preserved ejection fraction. The patient states that she wants to be proactive. And she feels that she has a few episodes of SOB/DOE that she cant explain. She goes to the gym, exercises, regularly, rowing machine, yoga and pilates. She had an episode while traveling to [place] that she had difficulty climbing a steep hill. Additionally, she was in [place] and developed SOB/DOE while walking up a steep hill. She knows that she is allergic to cats, and spent the night at her sons who has a cat and an episode of feeling like she couldn't breath. Also, she has noticed he SOB is worse with dry heat. No wheezing. Not ever tried an inhaler before. No prior pulmonary function. Use to have a dog in the home. She does have seasonal allergies. Usually worse in the spring and fall. Associated sinus congestion, and rhinitis. Prior allergy test +for dust allergies. Denies issues with cleaning supplies, or heavy scents, perfumes.  OV 11/27/2018: She is very anxious today about having COPD. She had PFTs today in the office which shows no evidence of obstruction..  She recently read an article about COPD and became very anxious about the possibility of having COPD.  She is very tearful today in the office.  She recently had an lower abdominal surgery with an abdominoplasty and a repair of an umbilical hernia.  She has been using an  abdominal binder for some weeks now.  Overall did very well with the surgery.  She is slowly been getting back active with her physical therapy.  She goes to the gym regularly or tries to use her home gym.  She has been using yoga and Pilates.  She is planning to go to [place] at the end of December for a spiritual journey to wear.  Overall her shortness of breath with exertion has improved mainly because she has not been exerting herself.  She does use her albuterol as needed but has not used it in the past several weeks.    Past Medical History:  Diagnosis Date  . Abdominal pain, generalized 01/29/2015  . Abnormal thyroid function test 02/25/2016  . ADHD (attention deficit hyperactivity disorder)   . Allergic rhinitis, unspecified 01/29/2015  . Ankle sprain 03/16/2016  . Anxiety 01/29/2015  . Arthritis    hands  . Chronic kidney disease 2012   kidney stones  . Constipation 01/29/2015  . Decreased libido 05/02/2018  . Disorder of the skin and subcutaneous tissue, unspecified 01/29/2015  . DJD (degenerative joint disease)    hand  . Dysthymia   . Endometriosis   . External otitis   . Family history of coronary artery disease 05/02/2018  . Fatigue due to excessive exertion 02/24/2016  . Fever blister   . Fibromyalgia 01/29/2015  . Frequency of micturition 01/29/2015  . Functional dyspepsia 01/29/2015  . Gastro-esophageal reflux disease without esophagitis 01/29/2015  . Headache(784.0)   .  High cholesterol   . Hyperlipidemia 09/12/2016  . Hypersomnia   . Hypothyroidism   . IBS (irritable bowel syndrome) 05/02/2018  . Insomnia   . Internal hemorrhoids   . 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 Labial pain 11/30/2016  . Lactose intolerance 12/14/2016  . Lactose intolerance in adult 09/12/2016  . 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 Left ureteral stone   . LP (lichen planus) 35/00/9381  . Major depressive disorder, single episode, unspecified   .  Migraine with aura, not intractable, without status migrainosus   . Mixed hyperlipidemia   . Mouth pain 01/07/2016  . Osteoarthritis of hip   . Osteopenia determined by x-ray 03/02/2017  . Personal history of estrogen therapy   . Phlebitis and thrombophlebitis of the leg   . Pityriasis alba 01/29/2015  . Pure hypercholesterolemia 06/29/2016  . Rectum pain 09/12/2016  . Recurrent sinus infections 02/24/2016  . Rhinosinusitis 01/07/2016  . Rosacea   . Sinusitis 12/24/2015  . 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 Umbilical hernia without obstruction and without gangrene 01/29/2015  . Umbilical pain 82/99/3716  . Unspecified urinary incontinence 01/07/2016  . Urticaria   . UTI (urinary tract infection)   . 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 Vitamin D deficiency      No family history on file.   Past Surgical History:  Procedure Laterality Date  . ABDOMINAL HYSTERECTOMY     partial  . BREAST ENHANCEMENT SURGERY    . BREAST REDUCTION SURGERY Bilateral 2018  . COLONOSCOPY WITH PROPOFOL N/A 02/25/2013   Procedure: COLONOSCOPY WITH PROPOFOL;  Surgeon: Garlan Fair, MD;  Location: [facility];  Service: Endoscopy;  Laterality: N/A;  . mini facelift  2000  . NASAL SINUS SURGERY    . ROTATOR CUFF REPAIR     Right  . ROTATOR CUFF REPAIR     Left  . SEPTOPLASTY WITH ETHMOIDECTOMY, AND MAXILLARY ANTROSTOMY Bilateral 2006  . TOTAL ABDOMINAL HYSTERECTOMY    . TUBAL LIGATION    . umbicial hernia      Social History   Socioeconomic History  . Marital status: Married    Spouse name: Not on file  . Number of children: Not on file  . Years of education: Not on file  . Highest education level: Not on file  Occupational History  . Not on file  Social Needs  . Financial resource strain: Not on file  . Food insecurity:    Worry: Not on file    Inability: Not on file  . Transportation needs:    Medical: Not on file    Non-medical: Not on  file  Tobacco Use  . Smoking status: Never Smoker  . Smokeless tobacco: Never Used  Substance and Sexual Activity  . Alcohol use: Yes    Alcohol/week: 1.0 standard drinks    Types: 1 Glasses of wine per week    Comment: every night  . Drug use: No  . Sexual activity: Yes    Partners: Male  Lifestyle  . Physical activity:    Days per week: Not on file    Minutes per session: Not on file  . Stress: Not on file  Relationships  . Social connections:    Talks on phone: Not on file    Gets together: Not on file    Attends religious service: Not on file    Active member of club or organization: Not on file    Attends  meetings of clubs or organizations: Not on file    Relationship status: Not on file  . Intimate partner violence:    Fear of current or ex partner: Not on file    Emotionally abused: Not on file    Physically abused: Not on file    Forced sexual activity: Not on file  Other Topics Concern  . Not on file  Social History Narrative   Married   Regular exercise: yes; 2 x a week with a trainer   Caffeine use: coffee daily     Allergies  Allergen Reactions  . Adhesive [Tape] Dermatitis  . Citrus Nausea And Vomiting  . Escitalopram Oxalate Hives  . Niacin And Related     Rash and breathing breathing problems  . Amoxicillin Rash    Unsure of reaction/ was instructed not to take drug. Unknown reaction      Outpatient Medications Prior to Visit  Medication Sig Dispense Refill  . acyclovir ointment (ZOVIRAX) 5 % Apply 1 application topically daily as needed (for fever blister).    Marland Kitchen albuterol (PROVENTIL HFA;VENTOLIN HFA) 108 (90 Base) MCG/ACT inhaler Inhale 2 puffs into the lungs every 6 (six) hours as needed for wheezing or shortness of breath. 1 Inhaler 6  . cetirizine (ZYRTEC) 10 MG tablet Take 10 mg by mouth daily.    . Cholecalciferol (VITAMIN D3) 1000 units CAPS Take 1 capsule by mouth daily.    . clonazePAM (KLONOPIN) 1 MG tablet Take 1.5 tablets by mouth  daily as needed.  2  . diclofenac sodium (VOLTAREN) 1 % GEL Apply 2 g topically 4 (four) times daily.    . Estrad-Estriol-Testost-Progest (BI-EST PROGEST-TESTOSTERONE TD) Place onto the skin as directed.    Marland Kitchen estradiol (ESTRACE) 1 MG tablet Take 1 tablet by mouth daily.  3  . Ketotifen Fumarate (ALLERGY EYE DROPS OP) Apply 1 drop to eye daily as needed (Allergies).    Marland Kitchen levothyroxine (SYNTHROID, LEVOTHROID) 112 MCG tablet Take 112 mcg by mouth daily before breakfast.    . metroNIDAZOLE (METROGEL) 1 % gel Apply 1 application topically daily. For rosacea    . Multiple Vitamin (MULTIVITAMIN WITH MINERALS) TABS tablet Take 1 tablet by mouth daily.    Marland Kitchen tretinoin (RETIN-A) 0.025 % cream Apply 1 application topically at bedtime as needed (for skin).    Marland Kitchen triamcinolone cream (KENALOG) 0.5 % Apply 1 application topically daily as needed (Nose irritation).     . valACYclovir (VALTREX) 500 MG tablet Take 250 mg by mouth 2 (two) times daily as needed (Out breaks Fever blisters).     . zaleplon (SONATA) 10 MG capsule Take 10 mg by mouth at bedtime as needed for sleep.     Marland Kitchen atorvastatin (LIPITOR) 40 MG tablet Take 1 tablet (40 mg total) by mouth daily. (Patient not taking: Reported on 11/27/2018) 90 tablet 3  . Coenzyme Q10 (CO Q-10) 100 MG CAPS Take 100 mg by mouth daily. (Patient not taking: Reported on 11/27/2018)  0   No facility-administered medications prior to visit.     Review of Systems  Constitutional: Negative for chills, fever, malaise/fatigue and weight loss.  HENT: Negative for hearing loss, sore throat and tinnitus.   Eyes: Negative for blurred vision and double vision.  Respiratory: Negative for cough, hemoptysis, sputum production, shortness of breath, wheezing and stridor.   Cardiovascular: Negative for chest pain, palpitations, orthopnea, leg swelling and PND.  Gastrointestinal: Negative for abdominal pain, constipation, diarrhea, heartburn, nausea and vomiting.  Genitourinary: Negative  for dysuria, hematuria and urgency.  Musculoskeletal: Negative for joint pain and myalgias.  Skin: Negative for itching and rash.  Neurological: Negative for dizziness, tingling, weakness and headaches.  Endo/Heme/Allergies: Negative for environmental allergies. Does not bruise/bleed easily.  Psychiatric/Behavioral: Negative for depression. The patient is not nervous/anxious and does not have insomnia.   All other systems reviewed and are negative.    Objective:  Physical Exam  Constitutional: She is oriented to person, place, and time. She appears well-developed and well-nourished. No distress.  HENT:  Head: Normocephalic and atraumatic.  Mouth/Throat: Oropharynx is clear and moist.  Eyes: Pupils are equal, round, and reactive to light. Conjunctivae are normal. No scleral icterus.  Neck: Neck supple. No JVD present. No tracheal deviation present.  Cardiovascular: Normal rate, regular rhythm, normal heart sounds and intact distal pulses.  No murmur heard. Pulmonary/Chest: Effort normal and breath sounds normal. No accessory muscle usage or stridor. No tachypnea. No respiratory distress. She has no wheezes. She has no rhonchi. She has no rales.  Abdominal: Soft. Bowel sounds are normal. She exhibits no distension. There is no tenderness.  Musculoskeletal: She exhibits no edema or tenderness.  Lymphadenopathy:    She has no cervical adenopathy.  Neurological: She is alert and oriented to person, place, and time.  Skin: Skin is warm and dry. Capillary refill takes less than 2 seconds. No rash noted.  Psychiatric: She has a normal mood and affect. Her behavior is normal.  Vitals reviewed.    Vitals:   11/27/18 1055  BP: 118/76  Pulse: 74  SpO2: 100%  Weight: 135 lb (61.2 kg)  Height: 5\' 3"  (1.6 m)   100% on RA BMI Readings from Last 3 Encounters:  11/27/18 23.91 kg/m  10/04/18 23.56 kg/m  07/24/18 23.24 kg/m   Wt Readings from Last 3 Encounters:  11/27/18 135 lb (61.2  kg)  10/04/18 133 lb (60.3 kg)  07/24/18 135 lb 6.4 oz (61.4 kg)     CBC    Component Value Date/Time   WBC 5.8 10/04/2018 1108   RBC 4.99 10/04/2018 1108   HGB 14.8 10/04/2018 1108   HCT 44.1 10/04/2018 1108   PLT 213.0 10/04/2018 1108   MCV 88.5 10/04/2018 1108   MCH 28.6 02/16/2017 0904   MCHC 33.5 10/04/2018 1108   RDW 14.1 10/04/2018 1108   LYMPHSABS 2.3 10/04/2018 1108   MONOABS 0.5 10/04/2018 1108   EOSABS 0.1 10/04/2018 1108   BASOSABS 0.1 10/04/2018 1108   10/04/2018: Regional allergy panel: Negative IgE: 28  Chest Imaging:  09/10/2018: CT coronary fractional flow reserve: No significant coronary stenosis noted  Pulmonary Functions Testing Results:  PFT Results Latest Ref Rng & Units 11/27/2018  FVC-Pre L 2.64  FVC-Predicted Pre % 91  FVC-Post L 2.64  FVC-Predicted Post % 91  Pre FEV1/FVC % % 80  Post FEV1/FCV % % 84  FEV1-Pre L 2.11  FEV1-Predicted Pre % 97  FEV1-Post L 2.21  DLCO UNC% % 77  DLCO COR %Predicted % 86  TLC L 4.81  TLC % Predicted % 98  RV % Predicted % 92     FeNO: None   Pathology: None   Echocardiogram:  07/26/2018 Study Conclusions  - Left ventricle: The cavity size was normal. Systolic function was   normal. The estimated ejection fraction was in the range of 60%   to 65%. Wall motion was normal; there were no regional wall   motion abnormalities. Left ventricular diastolic function   parameters were normal. -  Atrial septum: No defect or patent foramen ovale was identified. - Impressions: Normal GLS -24.1.  Heart Catheterization: None     Assessment & Plan:   SOB (shortness of breath)  Seasonal allergic rhinitis, unspecified trigger  Discussion:  This is 72 year old female with a prior complaint of shortness of breath and dyspnea on exertion.  There was concern of having COPD.  Her mother died of COPD this past year.  She is here for follow-up today after having PFTs completed.  She has a normal spirometry, normal  TLC and a normal DLCO.  She has no abnormalities on her PFTs and does not have COPD.  She was very relieved at this information.  Prior allergy panel and IgE was also normal.  Rater than 50% of this patient's 25-minute office visit was spent reviewing pulmonary function test in person with the patient as well as discussing etiologies of her shortness of breath.  And answering any questions that she may have regarding development of COPD.   Current Outpatient Medications:  .  acyclovir ointment (ZOVIRAX) 5 %, Apply 1 application topically daily as needed (for fever blister)., Disp: , Rfl:  .  albuterol (PROVENTIL HFA;VENTOLIN HFA) 108 (90 Base) MCG/ACT inhaler, Inhale 2 puffs into the lungs every 6 (six) hours as needed for wheezing or shortness of breath., Disp: 1 Inhaler, Rfl: 6 .  cetirizine (ZYRTEC) 10 MG tablet, Take 10 mg by mouth daily., Disp: , Rfl:  .  Cholecalciferol (VITAMIN D3) 1000 units CAPS, Take 1 capsule by mouth daily., Disp: , Rfl:  .  clonazePAM (KLONOPIN) 1 MG tablet, Take 1.5 tablets by mouth daily as needed., Disp: , Rfl: 2 .  diclofenac sodium (VOLTAREN) 1 % GEL, Apply 2 g topically 4 (four) times daily., Disp: , Rfl:  .  Estrad-Estriol-Testost-Progest (BI-EST PROGEST-TESTOSTERONE TD), Place onto the skin as directed., Disp: , Rfl:  .  estradiol (ESTRACE) 1 MG tablet, Take 1 tablet by mouth daily., Disp: , Rfl: 3 .  Ketotifen Fumarate (ALLERGY EYE DROPS OP), Apply 1 drop to eye daily as needed (Allergies)., Disp: , Rfl:  .  levothyroxine (SYNTHROID, LEVOTHROID) 112 MCG tablet, Take 112 mcg by mouth daily before breakfast., Disp: , Rfl:  .  metroNIDAZOLE (METROGEL) 1 % gel, Apply 1 application topically daily. For rosacea, Disp: , Rfl:  .  Multiple Vitamin (MULTIVITAMIN WITH MINERALS) TABS tablet, Take 1 tablet by mouth daily., Disp: , Rfl:  .  tretinoin (RETIN-A) 0.025 % cream, Apply 1 application topically at bedtime as needed (for skin)., Disp: , Rfl:  .  triamcinolone  cream (KENALOG) 0.5 %, Apply 1 application topically daily as needed (Nose irritation). , Disp: , Rfl:  .  valACYclovir (VALTREX) 500 MG tablet, Take 250 mg by mouth 2 (two) times daily as needed (Out breaks Fever blisters). , Disp: , Rfl:  .  zaleplon (SONATA) 10 MG capsule, Take 10 mg by mouth at bedtime as needed for sleep. , Disp: , Rfl:    Garner Nash, DO [facility] 11/27/2018 11:20 AM

## 2018-12-03 DIAGNOSIS — E785 Hyperlipidemia, unspecified: Secondary | ICD-10-CM | POA: Diagnosis not present

## 2018-12-03 DIAGNOSIS — I2510 Atherosclerotic heart disease of native coronary artery without angina pectoris: Secondary | ICD-10-CM | POA: Diagnosis not present

## 2018-12-03 DIAGNOSIS — M169 Osteoarthritis of hip, unspecified: Secondary | ICD-10-CM | POA: Diagnosis not present

## 2018-12-03 DIAGNOSIS — I10 Essential (primary) hypertension: Secondary | ICD-10-CM | POA: Diagnosis not present

## 2018-12-03 DIAGNOSIS — E782 Mixed hyperlipidemia: Secondary | ICD-10-CM | POA: Diagnosis not present

## 2018-12-03 DIAGNOSIS — F329 Major depressive disorder, single episode, unspecified: Secondary | ICD-10-CM | POA: Diagnosis not present

## 2018-12-03 DIAGNOSIS — E039 Hypothyroidism, unspecified: Secondary | ICD-10-CM | POA: Diagnosis not present

## 2018-12-04 ENCOUNTER — Other Ambulatory Visit: Payer: Self-pay | Admitting: Internal Medicine

## 2018-12-04 ENCOUNTER — Ambulatory Visit
Admission: RE | Admit: 2018-12-04 | Discharge: 2018-12-04 | Disposition: A | Discharge status: Home / Self Care | Payer: PPO | Source: Ambulatory Visit | Attending: Internal Medicine | Admitting: Internal Medicine

## 2018-12-04 DIAGNOSIS — R509 Fever, unspecified: Secondary | ICD-10-CM | POA: Diagnosis not present

## 2018-12-04 DIAGNOSIS — R0989 Other specified symptoms and signs involving the circulatory and respiratory systems: Secondary | ICD-10-CM

## 2018-12-04 DIAGNOSIS — R0689 Other abnormalities of breathing: Secondary | ICD-10-CM | POA: Diagnosis not present

## 2018-12-04 DIAGNOSIS — R05 Cough: Secondary | ICD-10-CM | POA: Diagnosis not present

## 2018-12-11 DIAGNOSIS — R0689 Other abnormalities of breathing: Secondary | ICD-10-CM | POA: Diagnosis not present

## 2018-12-11 DIAGNOSIS — Z7184 Encounter for health counseling related to travel: Secondary | ICD-10-CM | POA: Diagnosis not present

## 2018-12-11 DIAGNOSIS — R509 Fever, unspecified: Secondary | ICD-10-CM | POA: Diagnosis not present

## 2019-01-16 NOTE — Telephone Encounter (Signed)
Spoke w/pt and she stated she no longer wants to schedule with our office.  Patient said to discard of her records from [facility] (18pgs).  Records will be shredded

## 2019-01-28 DIAGNOSIS — R609 Edema, unspecified: Secondary | ICD-10-CM | POA: Diagnosis not present

## 2019-01-28 DIAGNOSIS — B001 Herpesviral vesicular dermatitis: Secondary | ICD-10-CM | POA: Diagnosis not present

## 2019-01-28 DIAGNOSIS — M793 Panniculitis, unspecified: Secondary | ICD-10-CM | POA: Diagnosis not present

## 2019-01-28 DIAGNOSIS — L500 Allergic urticaria: Secondary | ICD-10-CM | POA: Diagnosis not present

## 2019-04-24 DIAGNOSIS — K602 Anal fissure, unspecified: Secondary | ICD-10-CM | POA: Diagnosis not present

## 2019-04-24 DIAGNOSIS — M778 Other enthesopathies, not elsewhere classified: Secondary | ICD-10-CM | POA: Diagnosis not present

## 2019-04-24 DIAGNOSIS — M79644 Pain in right finger(s): Secondary | ICD-10-CM | POA: Diagnosis not present

## 2019-04-24 DIAGNOSIS — Z1159 Encounter for screening for other viral diseases: Secondary | ICD-10-CM | POA: Diagnosis not present

## 2019-04-24 DIAGNOSIS — B349 Viral infection, unspecified: Secondary | ICD-10-CM | POA: Diagnosis not present

## 2019-05-06 DIAGNOSIS — Z23 Encounter for immunization: Secondary | ICD-10-CM | POA: Diagnosis not present

## 2019-05-22 DIAGNOSIS — H524 Presbyopia: Secondary | ICD-10-CM | POA: Diagnosis not present

## 2019-05-22 DIAGNOSIS — Z961 Presence of intraocular lens: Secondary | ICD-10-CM | POA: Diagnosis not present

## 2019-05-22 DIAGNOSIS — Z9889 Other specified postprocedural states: Secondary | ICD-10-CM | POA: Diagnosis not present

## 2019-06-17 ENCOUNTER — Other Ambulatory Visit: Payer: Self-pay | Admitting: Plastic Surgery

## 2019-06-17 DIAGNOSIS — R109 Unspecified abdominal pain: Secondary | ICD-10-CM

## 2019-06-18 DIAGNOSIS — E559 Vitamin D deficiency, unspecified: Secondary | ICD-10-CM | POA: Diagnosis not present

## 2019-06-18 DIAGNOSIS — E039 Hypothyroidism, unspecified: Secondary | ICD-10-CM | POA: Diagnosis not present

## 2019-06-18 DIAGNOSIS — G4700 Insomnia, unspecified: Secondary | ICD-10-CM | POA: Diagnosis not present

## 2019-06-18 DIAGNOSIS — M797 Fibromyalgia: Secondary | ICD-10-CM | POA: Diagnosis not present

## 2019-06-18 DIAGNOSIS — E782 Mixed hyperlipidemia: Secondary | ICD-10-CM | POA: Diagnosis not present

## 2019-06-18 DIAGNOSIS — I10 Essential (primary) hypertension: Secondary | ICD-10-CM | POA: Diagnosis not present

## 2019-06-18 DIAGNOSIS — I2510 Atherosclerotic heart disease of native coronary artery without angina pectoris: Secondary | ICD-10-CM | POA: Diagnosis not present

## 2019-06-18 DIAGNOSIS — G720 Drug-induced myopathy: Secondary | ICD-10-CM | POA: Diagnosis not present

## 2019-06-18 DIAGNOSIS — Z1389 Encounter for screening for other disorder: Secondary | ICD-10-CM | POA: Diagnosis not present

## 2019-06-18 DIAGNOSIS — E739 Lactose intolerance, unspecified: Secondary | ICD-10-CM | POA: Diagnosis not present

## 2019-06-18 DIAGNOSIS — K589 Irritable 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: Secondary | ICD-10-CM | POA: Diagnosis not present

## 2019-06-18 DIAGNOSIS — Z0001 Encounter for general adult medical examination with abnormal findings: Secondary | ICD-10-CM | POA: Diagnosis not present

## 2019-06-18 DIAGNOSIS — F419 Anxiety disorder, unspecified: Secondary | ICD-10-CM | POA: Diagnosis not present

## 2019-07-04 ENCOUNTER — Other Ambulatory Visit: Payer: Self-pay

## 2019-07-04 ENCOUNTER — Ambulatory Visit
Admission: RE | Admit: 2019-07-04 | Discharge: 2019-07-04 | Disposition: A | Discharge status: Home / Self Care | Payer: PPO | Source: Ambulatory Visit | Attending: Plastic Surgery | Admitting: Plastic Surgery

## 2019-07-04 DIAGNOSIS — N200 Calculus of kidney: Secondary | ICD-10-CM | POA: Diagnosis not present

## 2019-07-04 DIAGNOSIS — R109 Unspecified abdominal pain: Secondary | ICD-10-CM

## 2019-07-04 DIAGNOSIS — Z9089 Acquired absence of other organs: Secondary | ICD-10-CM | POA: Diagnosis not present

## 2019-07-04 DIAGNOSIS — Z90710 Acquired absence of both cervix and uterus: Secondary | ICD-10-CM | POA: Diagnosis not present

## 2019-07-04 MED ORDER — IOPAMIDOL (ISOVUE-300) INJECTION 61%
100.0000 mL | Freq: Once | INTRAVENOUS | Status: AC | PRN
  Administered 2019-07-04: 100 mL via INTRAVENOUS

## 2019-07-09 DIAGNOSIS — N201 Calculus of ureter: Secondary | ICD-10-CM | POA: Diagnosis not present

## 2019-07-09 DIAGNOSIS — N3946 Mixed incontinence: Secondary | ICD-10-CM | POA: Diagnosis not present

## 2019-07-17 DIAGNOSIS — K589 Irritable 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: Secondary | ICD-10-CM | POA: Diagnosis not present

## 2019-07-17 DIAGNOSIS — R29890 Loss of height: Secondary | ICD-10-CM | POA: Diagnosis not present

## 2019-07-17 DIAGNOSIS — M8589 Other specified disorders of bone density and structure, multiple sites: Secondary | ICD-10-CM | POA: Diagnosis not present

## 2019-07-17 DIAGNOSIS — R6889 Other general symptoms and signs: Secondary | ICD-10-CM | POA: Diagnosis not present

## 2019-07-17 DIAGNOSIS — E559 Vitamin D deficiency, unspecified: Secondary | ICD-10-CM | POA: Diagnosis not present

## 2019-07-17 DIAGNOSIS — Z1231 Encounter for screening mammogram for malignant neoplasm of breast: Secondary | ICD-10-CM | POA: Diagnosis not present

## 2019-07-17 DIAGNOSIS — Z90710 Acquired absence of both cervix and uterus: Secondary | ICD-10-CM | POA: Diagnosis not present

## 2019-07-22 ENCOUNTER — Other Ambulatory Visit: Payer: Self-pay

## 2019-07-22 DIAGNOSIS — R6889 Other general symptoms and signs: Secondary | ICD-10-CM | POA: Diagnosis not present

## 2019-07-22 DIAGNOSIS — Z20822 Contact with and (suspected) exposure to covid-19: Secondary | ICD-10-CM

## 2019-07-24 LAB — NOVEL CORONAVIRUS, NAA: SARS-CoV-2, NAA: NOT DETECTED

## 2019-07-24 LAB — SPECIMEN STATUS REPORT

## 2019-07-25 DIAGNOSIS — E7800 Pure hypercholesterolemia, unspecified: Secondary | ICD-10-CM | POA: Diagnosis not present

## 2019-07-25 DIAGNOSIS — E785 Hyperlipidemia, unspecified: Secondary | ICD-10-CM | POA: Diagnosis not present

## 2019-07-25 DIAGNOSIS — E782 Mixed hyperlipidemia: Secondary | ICD-10-CM | POA: Diagnosis not present

## 2019-07-25 DIAGNOSIS — I10 Essential (primary) hypertension: Secondary | ICD-10-CM | POA: Diagnosis not present

## 2019-07-25 DIAGNOSIS — I2510 Atherosclerotic heart disease of native coronary artery without angina pectoris: Secondary | ICD-10-CM | POA: Diagnosis not present

## 2019-07-25 DIAGNOSIS — F329 Major depressive disorder, single episode, unspecified: Secondary | ICD-10-CM | POA: Diagnosis not present

## 2019-07-25 DIAGNOSIS — M169 Osteoarthritis of hip, unspecified: Secondary | ICD-10-CM | POA: Diagnosis not present

## 2019-07-25 DIAGNOSIS — E039 Hypothyroidism, unspecified: Secondary | ICD-10-CM | POA: Diagnosis not present

## 2019-08-01 DIAGNOSIS — R32 Unspecified urinary incontinence: Secondary | ICD-10-CM | POA: Diagnosis not present

## 2019-08-01 DIAGNOSIS — Z01419 Encounter for gynecological examination (general) (routine) without abnormal findings: Secondary | ICD-10-CM | POA: Diagnosis not present

## 2019-08-01 DIAGNOSIS — R37 Sexual dysfunction, unspecified: Secondary | ICD-10-CM | POA: Diagnosis not present

## 2019-08-07 DIAGNOSIS — E039 Hypothyroidism, unspecified: Secondary | ICD-10-CM | POA: Diagnosis not present

## 2019-08-07 DIAGNOSIS — I2510 Atherosclerotic heart disease of native coronary artery without angina pectoris: Secondary | ICD-10-CM | POA: Diagnosis not present

## 2019-08-07 DIAGNOSIS — E785 Hyperlipidemia, unspecified: Secondary | ICD-10-CM | POA: Diagnosis not present

## 2019-08-07 DIAGNOSIS — E782 Mixed hyperlipidemia: Secondary | ICD-10-CM | POA: Diagnosis not present

## 2019-08-07 DIAGNOSIS — E7800 Pure hypercholesterolemia, unspecified: Secondary | ICD-10-CM | POA: Diagnosis not present

## 2019-08-07 DIAGNOSIS — F329 Major depressive disorder, single episode, unspecified: Secondary | ICD-10-CM | POA: Diagnosis not present

## 2019-08-07 DIAGNOSIS — I10 Essential (primary) hypertension: Secondary | ICD-10-CM | POA: Diagnosis not present

## 2019-08-07 DIAGNOSIS — M169 Osteoarthritis of hip, unspecified: Secondary | ICD-10-CM | POA: Diagnosis not present

## 2019-08-08 DIAGNOSIS — L719 Rosacea, unspecified: Secondary | ICD-10-CM | POA: Diagnosis not present

## 2019-08-08 DIAGNOSIS — D485 Neoplasm of uncertain behavior of skin: Secondary | ICD-10-CM | POA: Diagnosis not present

## 2019-08-08 DIAGNOSIS — L821 Other seborrheic keratosis: Secondary | ICD-10-CM | POA: Diagnosis not present

## 2019-08-08 DIAGNOSIS — B079 Viral wart, unspecified: Secondary | ICD-10-CM | POA: Diagnosis not present

## 2019-08-15 DIAGNOSIS — Z713 Dietary counseling and surveillance: Secondary | ICD-10-CM | POA: Diagnosis not present

## 2019-09-30 DIAGNOSIS — E039 Hypothyroidism, unspecified: Secondary | ICD-10-CM | POA: Diagnosis not present

## 2019-09-30 DIAGNOSIS — Z01818 Encounter for other preprocedural examination: Secondary | ICD-10-CM | POA: Diagnosis not present

## 2019-10-10 DIAGNOSIS — M1811 Unilateral primary osteoarthritis of first carpometacarpal joint, right hand: Secondary | ICD-10-CM | POA: Diagnosis not present

## 2019-10-10 DIAGNOSIS — M25511 Pain in right shoulder: Secondary | ICD-10-CM | POA: Diagnosis not present

## 2019-10-10 DIAGNOSIS — M654 Radial styloid tenosynovitis [de Quervain]: Secondary | ICD-10-CM | POA: Diagnosis not present

## 2019-10-19 DIAGNOSIS — Z03818 Encounter for observation for suspected exposure to other biological agents ruled out: Secondary | ICD-10-CM | POA: Diagnosis not present

## 2019-11-14 DIAGNOSIS — M1811 Unilateral primary osteoarthritis of first carpometacarpal joint, right hand: Secondary | ICD-10-CM | POA: Diagnosis not present

## 2019-11-28 DIAGNOSIS — M1811 Unilateral primary osteoarthritis of first carpometacarpal joint, right hand: Secondary | ICD-10-CM | POA: Diagnosis not present

## 2019-12-11 DIAGNOSIS — Z79890 Hormone replacement therapy (postmenopausal): Secondary | ICD-10-CM | POA: Diagnosis not present

## 2019-12-11 DIAGNOSIS — I10 Essential (primary) hypertension: Secondary | ICD-10-CM | POA: Diagnosis not present

## 2019-12-11 DIAGNOSIS — I2510 Atherosclerotic heart disease of native coronary artery without angina pectoris: Secondary | ICD-10-CM | POA: Diagnosis not present

## 2019-12-11 DIAGNOSIS — F329 Major depressive disorder, single episode, unspecified: Secondary | ICD-10-CM | POA: Diagnosis not present

## 2019-12-11 DIAGNOSIS — E782 Mixed hyperlipidemia: Secondary | ICD-10-CM | POA: Diagnosis not present

## 2020-01-06 DIAGNOSIS — M1811 Unilateral primary osteoarthritis of first carpometacarpal joint, right hand: Secondary | ICD-10-CM | POA: Diagnosis not present

## 2020-01-06 DIAGNOSIS — M654 Radial styloid tenosynovitis [de Quervain]: Secondary | ICD-10-CM | POA: Diagnosis not present

## 2020-01-09 DIAGNOSIS — M1811 Unilateral primary osteoarthritis of first carpometacarpal joint, right hand: Secondary | ICD-10-CM | POA: Diagnosis not present

## 2020-01-09 DIAGNOSIS — M79644 Pain in right finger(s): Secondary | ICD-10-CM | POA: Diagnosis not present

## 2020-02-06 DIAGNOSIS — M79644 Pain in right finger(s): Secondary | ICD-10-CM | POA: Diagnosis not present

## 2020-02-06 DIAGNOSIS — M25641 Stiffness of right hand, not elsewhere classified: Secondary | ICD-10-CM | POA: Diagnosis not present

## 2020-02-06 DIAGNOSIS — M25631 Stiffness of right wrist, not elsewhere classified: Secondary | ICD-10-CM | POA: Diagnosis not present

## 2020-02-18 DIAGNOSIS — F419 Anxiety disorder, unspecified: Secondary | ICD-10-CM | POA: Diagnosis not present

## 2020-02-18 DIAGNOSIS — F322 Major depressive disorder, single episode, severe without psychotic features: Secondary | ICD-10-CM | POA: Diagnosis not present

## 2020-02-18 DIAGNOSIS — G4700 Insomnia, unspecified: Secondary | ICD-10-CM | POA: Diagnosis not present

## 2020-02-18 DIAGNOSIS — Z79890 Hormone replacement therapy (postmenopausal): Secondary | ICD-10-CM | POA: Diagnosis not present

## 2020-02-18 DIAGNOSIS — I10 Essential (primary) hypertension: Secondary | ICD-10-CM | POA: Diagnosis not present

## 2020-02-18 DIAGNOSIS — I2510 Atherosclerotic heart disease of native coronary artery without angina pectoris: Secondary | ICD-10-CM | POA: Diagnosis not present

## 2020-02-18 DIAGNOSIS — E782 Mixed hyperlipidemia: Secondary | ICD-10-CM | POA: Diagnosis not present

## 2020-03-05 DIAGNOSIS — M1811 Unilateral primary osteoarthritis of first carpometacarpal joint, right hand: Secondary | ICD-10-CM | POA: Diagnosis not present

## 2020-03-05 DIAGNOSIS — M25641 Stiffness of right hand, not elsewhere classified: Secondary | ICD-10-CM | POA: Diagnosis not present

## 2020-03-05 DIAGNOSIS — M79644 Pain in right finger(s): Secondary | ICD-10-CM | POA: Diagnosis not present

## 2020-03-05 DIAGNOSIS — M25631 Stiffness of right wrist, not elsewhere classified: Secondary | ICD-10-CM | POA: Diagnosis not present

## 2020-05-04 DIAGNOSIS — F4320 Adjustment disorder, unspecified: Secondary | ICD-10-CM | POA: Diagnosis not present

## 2020-05-19 ENCOUNTER — Other Ambulatory Visit: Payer: Self-pay | Admitting: *Deleted

## 2020-05-19 DIAGNOSIS — L820 Inflamed seborrheic keratosis: Secondary | ICD-10-CM | POA: Diagnosis not present

## 2020-05-19 DIAGNOSIS — D220 Melanocytic nevi of lip: Secondary | ICD-10-CM | POA: Diagnosis not present

## 2020-05-19 DIAGNOSIS — L821 Other seborrheic keratosis: Secondary | ICD-10-CM | POA: Diagnosis not present

## 2020-05-19 DIAGNOSIS — L929 Granulomatous disorder of the skin and subcutaneous tissue, unspecified: Secondary | ICD-10-CM | POA: Diagnosis not present

## 2020-05-19 DIAGNOSIS — D1801 Hemangioma of skin and subcutaneous tissue: Secondary | ICD-10-CM | POA: Diagnosis not present

## 2020-05-19 DIAGNOSIS — D485 Neoplasm of uncertain behavior of skin: Secondary | ICD-10-CM | POA: Diagnosis not present

## 2020-05-19 DIAGNOSIS — D225 Melanocytic nevi of trunk: Secondary | ICD-10-CM | POA: Diagnosis not present

## 2020-05-22 ENCOUNTER — Other Ambulatory Visit: Payer: Self-pay | Admitting: Pulmonary Disease

## 2020-05-22 MED ORDER — ALBUTEROL SULFATE HFA 108 (90 BASE) MCG/ACT IN AERS: 2.0000 | INHALATION_SPRAY | Freq: Four times a day (QID) | RESPIRATORY_TRACT | 5 refills | Status: DC | PRN

## 2020-06-09 DIAGNOSIS — H524 Presbyopia: Secondary | ICD-10-CM | POA: Diagnosis not present

## 2020-06-09 DIAGNOSIS — Z961 Presence of intraocular lens: Secondary | ICD-10-CM | POA: Diagnosis not present

## 2020-07-13 DIAGNOSIS — Z79890 Hormone replacement therapy (postmenopausal): Secondary | ICD-10-CM | POA: Diagnosis not present

## 2020-07-13 DIAGNOSIS — Z0000 Encounter for general adult medical examination without abnormal findings: Secondary | ICD-10-CM | POA: Diagnosis not present

## 2020-07-13 DIAGNOSIS — G4700 Insomnia, unspecified: Secondary | ICD-10-CM | POA: Diagnosis not present

## 2020-07-13 DIAGNOSIS — F322 Major depressive disorder, single episode, severe without psychotic features: Secondary | ICD-10-CM | POA: Diagnosis not present

## 2020-07-13 DIAGNOSIS — F419 Anxiety disorder, unspecified: Secondary | ICD-10-CM | POA: Diagnosis not present

## 2020-07-13 DIAGNOSIS — E559 Vitamin D deficiency, unspecified: Secondary | ICD-10-CM | POA: Diagnosis not present

## 2020-07-13 DIAGNOSIS — H9313 Tinnitus, bilateral: Secondary | ICD-10-CM | POA: Diagnosis not present

## 2020-07-13 DIAGNOSIS — R21 Rash and other nonspecific skin eruption: Secondary | ICD-10-CM | POA: Diagnosis not present

## 2020-07-13 DIAGNOSIS — F329 Major depressive disorder, single episode, unspecified: Secondary | ICD-10-CM | POA: Diagnosis not present

## 2020-07-13 DIAGNOSIS — I2510 Atherosclerotic heart disease of native coronary artery without angina pectoris: Secondary | ICD-10-CM | POA: Diagnosis not present

## 2020-07-13 DIAGNOSIS — E782 Mixed hyperlipidemia: Secondary | ICD-10-CM | POA: Diagnosis not present

## 2020-07-13 DIAGNOSIS — G720 Drug-induced myopathy: Secondary | ICD-10-CM | POA: Diagnosis not present

## 2020-07-13 DIAGNOSIS — E039 Hypothyroidism, unspecified: Secondary | ICD-10-CM | POA: Diagnosis not present

## 2020-07-13 DIAGNOSIS — I10 Essential (primary) hypertension: Secondary | ICD-10-CM | POA: Diagnosis not present

## 2020-08-03 DIAGNOSIS — N644 Mastodynia: Secondary | ICD-10-CM | POA: Diagnosis not present

## 2020-08-03 DIAGNOSIS — Z01419 Encounter for gynecological examination (general) (routine) without abnormal findings: Secondary | ICD-10-CM | POA: Diagnosis not present

## 2020-08-11 DIAGNOSIS — N6322 Unspecified lump in the left breast, upper inner quadrant: Secondary | ICD-10-CM | POA: Diagnosis not present

## 2020-08-11 DIAGNOSIS — Z803 Family history of malignant neoplasm of breast: Secondary | ICD-10-CM | POA: Diagnosis not present

## 2020-12-01 DIAGNOSIS — W19XXXA Unspecified fall, initial encounter: Secondary | ICD-10-CM | POA: Diagnosis not present

## 2020-12-01 DIAGNOSIS — E039 Hypothyroidism, unspecified: Secondary | ICD-10-CM | POA: Diagnosis not present

## 2020-12-01 DIAGNOSIS — K6289 Other specified diseases of anus and rectum: Secondary | ICD-10-CM | POA: Diagnosis not present

## 2020-12-01 DIAGNOSIS — S0990XA Unspecified injury of head, initial encounter: Secondary | ICD-10-CM | POA: Diagnosis not present

## 2020-12-09 ENCOUNTER — Ambulatory Visit (INDEPENDENT_AMBULATORY_CARE_PROVIDER_SITE_OTHER): Payer: PPO | Admitting: Neurology

## 2020-12-09 ENCOUNTER — Encounter: Payer: Self-pay | Admitting: Neurology

## 2020-12-09 ENCOUNTER — Telehealth: Payer: Self-pay | Admitting: Neurology

## 2020-12-09 VITALS — BP 117/65 | HR 86 | Ht 63.0 in | Wt 132.0 lb

## 2020-12-09 DIAGNOSIS — S069X0A Unspecified intracranial injury without loss of consciousness, initial encounter: Secondary | ICD-10-CM

## 2020-12-09 DIAGNOSIS — R42 Dizziness and giddiness: Secondary | ICD-10-CM | POA: Diagnosis not present

## 2020-12-09 DIAGNOSIS — S060X1A Concussion with loss of consciousness of 30 minutes or less, initial encounter: Secondary | ICD-10-CM | POA: Diagnosis not present

## 2020-12-09 DIAGNOSIS — R296 Repeated falls: Secondary | ICD-10-CM | POA: Diagnosis not present

## 2020-12-09 MED ORDER — ALPRAZOLAM 0.5 MG PO TABS: 0.5000 mg | ORAL_TABLET | Freq: Every evening | ORAL | 0 refills | Status: DC | PRN

## 2020-12-09 MED ORDER — DONEPEZIL HCL 5 MG PO TABS: 5.0000 mg | ORAL_TABLET | Freq: Two times a day (BID) | ORAL | 2 refills | Status: DC

## 2020-12-09 NOTE — Patient Instructions (Signed)
Traumatic Brain Injury Traumatic brain injury (TBI) is an injury to the brain that results from:  A hard, direct blow to the head (closed injury).  An object penetrating the skull and entering the brain (open injury). Traumatic brain injury is also called a head injury or a concussion. TBI can be mild, moderate, or severe. What are the causes? Common causes of this condition include:  Falls.  Motor vehicle accidents.  Sports injuries.  Assaults. What increases the risk? You are more likely to develop this condition if you:  Are 72 years old or older.  Are a man.  Play contact sports, especially football, hockey, or soccer.  Are in the [employer]  Are a victim of violence.  Abuse drugs or alcohol.  Have had a previous TBI. What are the signs or symptoms? Symptoms may vary from person to person, and may include:  Loss of consciousness.  Headache.  Confusion.  Fatigue.  Changes in sleep.  Dizziness.  Mood or personality changes.  Memory problems.  Nausea or vomiting or both.  Seizures.  Clumsiness.  Slurred speech.  Depression and anxiety.  Anger.  Trouble concentrating, organizing, or making decisions.  Inability to control emotions or actions (impulse control).  Loss of or dulling of the senses, such as hearing, vision, and touch. This can include: ? Blurred vision. ? Ringing in your ears. How is this diagnosed? This condition may be diagnosed based on:  Medical history and physical exam.  Neurologic exam. This checks for brain and nervous system function, including your reflexes, memory, and coordination.  CT scan. Your TBI may be described as mild, moderate, or severe. How is this treated? Treatment depends on the severity of your brain injury and may include:  Breathing support (mechanical ventilation).  Blood pressure medicines.  Pain medicines.  Treatments to decrease the swelling in your brain.  Brain surgery. This  may be needed to: ? Remove a blood clot. ? Repair bleeding. ? Remove an object that has penetrated the brain, such as a skull fragment or a bullet. Treatment of TBI also includes:  Physical and mental rest.  Careful observation.  Medicine. You may be prescribed medicines to help with symptoms such as headaches, nausea, or difficulty sleeping.  Physical, occupational, and speech therapy.  Referral to a concussion clinic or rehabilitation center. Follow these instructions at home: Medicines  Take over-the-counter and prescription medicines only as told by your health care provider.  Do not take blood thinners (anticoagulants), aspirin, or other anti-inflammatory medicines such as ibuprofen or naproxen unless approved by your health care provider. Activity  Rest. Rest helps the brain to heal. Make sure you: ? Get plenty of sleep. Most adults should get 7-9 hours of sleep each night. ? Rest during the day. Take daytime naps or rest breaks when you feel tired.  Do not do high-risk activities that could cause a second concussion, such as riding a bike or playing sports. Having another concussion before the first one has healed can be dangerous.  Avoid a lot of visual stimulation. This includes work on the computer or phone, watching TV, and reading.  Ask your health care provider what kind of activities are safe for you. Your ability to react may be slower after a brain injury. Never do these activities if you are dizzy. Your health care provider will likely give you a plan for gradually returning to activities. General instructions  Do not drink alcohol.  Watch your symptoms and tell others  to do the same. Complications sometimes occur after a brain injury. Older adults with a brain injury may have a higher risk of serious complications.  Seek support from friends and family.  Keep all follow-up visits as directed by your health care provider. This is important. Contact a health  care provider if:  Your symptoms get worse or they do not improve.  You have new symptoms.  You have another injury. Get help right away if:  You have: ? Severe, persistent headaches that are not relieved by medicine. ? Weakness or numbness in any part of your body. ? Confusion. ? Slurred speech. ? Difficulty waking up. ? Nausea or persistent vomiting. ? A feeling like you are moving when you are not (vertigo). ? Seizures or you faint. ? Changes in your vision. ? Clear or bloody discharge from your nose or ears.  You cannot use your arms or legs normally. Summary  Traumatic brain injury happens when there is a hard, direct blow to the head or when an object penetrates the skull and enters the brain.  Traumatic brain injuries may be mild, moderate, or severe. Treatment depends on the severity of your injury.  Get help right away if you have a head injury and you develop seizures, confusion, vomiting, weakness in the arms or legs, slurred speech, and other symptoms.  Rest is one of the best treatments. Do not return to activity until your health care provider approves. This information is not intended to replace advice given to you by your health care provider. Make sure you discuss any questions you have with your health care provider. Document Revised: 04/18/2018 Document Reviewed: 01/29/2018 Elsevier Patient Education  [place]. Alprazolam tablets What is this medicine? ALPRAZOLAM (al PRAY zoe lam) is a benzodiazepine. It is used to treat anxiety and panic attacks. This medicine may be used for other purposes; ask your health care provider or pharmacist if you have questions. COMMON BRAND NAME(S): Xanax What should I tell my health care provider before I take this medicine? They need to know if you have any of these conditions:  an alcohol or drug abuse problem  bipolar disorder, depression, psychosis or other mental health conditions  glaucoma  kidney or  liver disease  lung or breathing disease  myasthenia gravis  Parkinson's disease  porphyria  seizures or a history of seizures  suicidal thoughts  an unusual or allergic reaction to alprazolam, other benzodiazepines, foods, dyes, or preservatives  pregnant or trying to get pregnant  breast-feeding How should I use this medicine? Take this medicine by mouth with a glass of water. Follow the directions on the prescription label. Take your medicine at regular intervals. Do not take it more often than directed. Do not stop taking except on your doctor's advice. A special MedGuide will be given to you by the pharmacist with each prescription and refill. Be sure to read this information carefully each time. Talk to your pediatrician regarding the use of this medicine in children. Special care may be needed. Overdosage: If you think you have taken too much of this medicine contact a poison control center or emergency room at once. NOTE: This medicine is only for you. Do not share this medicine with others. What if I miss a dose? If you miss a dose, take it as soon as you can. If it is almost time for your next dose, take only that dose. Do not take double or extra doses. What may interact with this medicine?  Do not take this medicine with any of the following medications:  certain antiviral medicines for HIV or AIDS like delavirdine, indinavir  certain medicines for fungal infections like ketoconazole and itraconazole  narcotic medicines for cough  sodium oxybate This medicine may also interact with the following medications:  alcohol  antihistamines for allergy, cough and cold  certain antibiotics like clarithromycin, erythromycin, isoniazid, rifampin, rifapentine, rifabutin, and troleandomycin  certain medicines for blood pressure, heart disease, irregular heart beat  certain medicines for depression, like amitriptyline, fluoxetine, sertraline  certain medicines for  seizures like carbamazepine, oxcarbazepine, phenobarbital, phenytoin, primidone  cimetidine  cyclosporine  female hormones, like estrogens or progestins and birth control pills, patches, rings, or injections  general anesthetics like halothane, isoflurane, methoxyflurane, propofol  grapefruit juice  local anesthetics like lidocaine, pramoxine, tetracaine  medicines that relax muscles for surgery  narcotic medicines for pain  other antiviral medicines for HIV or AIDS  phenothiazines like chlorpromazine, mesoridazine, prochlorperazine, thioridazine This list may not describe all possible interactions. Give your health care provider a list of all the medicines, herbs, non-prescription drugs, or dietary supplements you use. Also tell them if you smoke, drink alcohol, or use illegal drugs. Some items may interact with your medicine. What should I watch for while using this medicine? Tell your doctor or health care professional if your symptoms do not start to get better or if they get worse. Do not stop taking except on your doctor's advice. You may develop a severe reaction. Your doctor will tell you how much medicine to take. You may get drowsy or dizzy. Do not drive, use machinery, or do anything that needs mental alertness until you know how this medicine affects you. To reduce the risk of dizzy and fainting spells, do not stand or sit up quickly, especially if you are an older patient. Alcohol may increase dizziness and drowsiness. Avoid alcoholic drinks. If you are taking another medicine that also causes drowsiness, you may have more side effects. Give your health care provider a list of all medicines you use. Your doctor will tell you how much medicine to take. Do not take more medicine than directed. Call emergency for help if you have problems breathing or unusual sleepiness. What side effects may I notice from receiving this medicine? Side effects that you should report to your  doctor or health care professional as soon as possible:  allergic reactions like skin rash, itching or hives, swelling of the face, lips, or tongue  breathing problems  confusion  loss of balance or coordination  signs and symptoms of low blood pressure like dizziness; feeling faint or lightheaded, falls; unusually weak or tired  suicidal thoughts or other mood changes Side effects that usually do not require medical attention (report to your doctor or health care professional if they continue or are bothersome):  dizziness  dry mouth  nausea, vomiting  tiredness This list may not describe all possible side effects. Call your doctor for medical advice about side effects. You may report side effects to FDA at 1-800-FDA-1088. Where should I keep my medicine? Keep out of the reach of children. This medicine can be abused. Keep your medicine in a safe place to protect it from theft. Do not share this medicine with anyone. Selling or giving away this medicine is dangerous and against the law. Store at room temperature between 20 and 25 degrees C (68 and 77 degrees F). This medicine may cause accidental overdose and death if taken by other adults,  children, or pets. Mix any unused medicine with a substance like cat litter or coffee grounds. Then throw the medicine away in a sealed container like a sealed bag or a coffee can with a lid. Do not use the medicine after the expiration date. NOTE: This sheet is a summary. It may not cover all possible information. If you have questions about this medicine, talk to your doctor, pharmacist, or health care provider.  2020 Elsevier/Gold Standard (2015-09-10 13:47:25)

## 2020-12-09 NOTE — Progress Notes (Signed)
SLEEP MEDICINE CLINIC    Provider:  Larey Seat, MD  Primary Care Physician:  Josetta Huddle, MD [address] [place] [address]     Referring Provider: Josetta Huddle, Md [address] [address],  [place] [address]          Chief Complaint according to patient   Patient presents with:    . New Patient (Initial Visit)           HISTORY OF PRESENT ILLNESS:  Wendy Joyce is a 74 y.o. year old White or Caucasian female patient seen here in a consultation, upon referral on 12/09/2020 from Dr. Inda Merlin.  Chief concern according to patient :  Wendy Joyce describes that on November 06, 2020 she was in a boutique's dressing room and tried on a pair of jeans while standing.  She lost balance but was unable to break the fall or respond adequately she saw herself literally falling to the ground she fell backwards as he describes it like a rock and hit the back of her head however the floor there was carpeted but in the fall she hit the wall mounted mirror in her cabin.  She feels that there was no loss of consciousness or loss of awareness she had to maneuver on her side and then got herself up without assistance of another person. The second fall occurred on Friday, 3 December of this year she was actually sitting down on a stool in her own dressing area which is part of her bathroom and has a hard marble floor.  She was putting on leggings and stood up slowly to hang up her bathroom and during the day rotating movement to reach the clock she again fell backwards she felt no warning aura anything she just watched herself in slow motion and her head hit heart on the marble floor in the closet door.  She also injured her right shoulder elbow right hip and hand.  She just had a surgical intervention for the right hand.  She reports her neck has remained stiff and tender, she has noted more crackling noises, she has tinnitus, stronger in the left ear- she moves  very slow now, is feeling at risk , and not safe, avoiding any fast head or truncal movements.  She has a remote history of whiplash, from college days.  She had not yet seen her cardiologist Dr. Leory Plowman, has not yet undergone cardiac monitoring to see if that could be an arrhythmia.      Wendy Joyce is seen on 12-09-2020, a right -handed White or Caucasian female who  has a past medical history of Abdominal pain, generalized (01/29/2015), Abnormal thyroid function test (02/25/2016), ADHD (attention deficit hyperactivity disorder), Allergic rhinitis, unspecified (01/29/2015), Ankle sprain (03/16/2016), Anxiety (01/29/2015), Arthritis, Chronic kidney disease (2012), Constipation (01/29/2015), Decreased libido (05/02/2018), Disorder of the skin and subcutaneous tissue, unspecified (01/29/2015), DJD (degenerative joint disease), Dysthymia, Endometriosis, External otitis, Family history of coronary artery disease (05/02/2018), Fatigue due to excessive exertion (02/24/2016), Fever blister, Fibromyalgia (01/29/2015), Frequency of micturition (01/29/2015), Functional dyspepsia (01/29/2015), Gastro-esophageal reflux disease without esophagitis (01/29/2015), Headache(784.0), High cholesterol, Hyperlipidemia (09/12/2016), Hypersomnia, Hypothyroidism, IBS (irritable bowel syndrome) (05/02/2018), Insomnia, Internal hemorrhoids, Irritable 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 (01/29/2015), Labial pain (11/30/2016), Lactose intolerance (12/14/2016), Lactose intolerance in adult (09/12/2016), 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), Left ureteral stone, 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), Major depressive disorder, single episode, unspecified, Migraine with aura, not intractable, without status migrainosus, Mixed hyperlipidemia,  Mouth pain (01/07/2016), Osteoarthritis of hip, Osteopenia determined by x-ray (03/02/2017), Personal history of estrogen therapy, Phlebitis and thrombophlebitis of the leg, Pityriasis alba  (01/29/2015), Pure hypercholesterolemia (06/29/2016), Rectum pain (09/12/2016), Recurrent sinus infections (02/24/2016), Rhinosinusitis (01/07/2016), Rosacea, Sinusitis (12/24/2015), Tinnitus of left ear (83/41/9622), Umbilical hernia without obstruction and without gangrene (29/79/8921), Umbilical pain (19/41/7408), Unspecified urinary incontinence (01/07/2016), Urticaria, UTI (urinary tract infection), 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   Review of Systems: Out of a complete 14 system review, the patient complains of only the following symptoms, and all other reviewed systems are negative.: see above    Social History   Socioeconomic History  . Marital status: Married    Spouse name: Not on file  . Number of children: 4, and 5 grandchildren  . Years of education: college  . Highest education level: Not on file  Occupational History  . Not on file:                                 Art school teacher, painter, gallerist   Tobacco Use  . Smoking status: Never Smoker  . Smokeless tobacco: Never Used  Substance and Sexual Activity  . Alcohol use: Yes    Alcohol/week: 1.0 standard drink    Types: 1 Glasses of wine per week    Comment: every night  . Drug use: No  . Sexual activity: Yes    Partners: Male  Other Topics Concern  . Not on file  Social History Narrative   Married   Regular exercise: yes; 2 x a week with a trainer   Caffeine use: coffee daily   Social Determinants of Radio broadcast assistant Strain: Not on file  Food Insecurity: Not on file  Transportation Needs: Not on file  Physical Activity: Not on file  Stress: Not on file  Social Connections: Not on file    No family history on file. : Mother with dementia.   Past Medical History:  Diagnosis Date  . Abdominal pain, generalized 01/29/2015  . Abnormal thyroid function test 02/25/2016  . ADHD (attention deficit hyperactivity disorder)    . Allergic rhinitis, unspecified 01/29/2015  . Ankle sprain 03/16/2016  . Anxiety 01/29/2015  . Arthritis    hands  . Chronic kidney disease 2012   kidney stones  . Constipation 01/29/2015  . Decreased libido 05/02/2018  . Disorder of the skin and subcutaneous tissue, unspecified 01/29/2015  . DJD (degenerative joint disease)    hand  . Dysthymia   . Endometriosis   . External otitis   . Family history of coronary artery disease 05/02/2018  . Fatigue due to excessive exertion 02/24/2016  . Fever blister   . Fibromyalgia 01/29/2015  . Frequency of micturition 01/29/2015  . Functional dyspepsia 01/29/2015  . Gastro-esophageal reflux disease without esophagitis 01/29/2015  . Headache(784.0)   . High cholesterol   . Hyperlipidemia 09/12/2016  . Hypersomnia   . Hypothyroidism   . IBS (irritable bowel syndrome) 05/02/2018  . Insomnia   . Internal hemorrhoids   . 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 Labial pain 11/30/2016  . Lactose intolerance 12/14/2016  . Lactose intolerance in adult 09/12/2016  . 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 Left ureteral stone   . LP (lichen planus) 14/48/1856  . Major depressive disorder, single episode, unspecified   . Migraine with aura, not intractable, without status  migrainosus   . Mixed hyperlipidemia   . Mouth pain 01/07/2016  . Osteoarthritis of hip   . Osteopenia determined by x-ray 03/02/2017  . Personal history of estrogen therapy   . Phlebitis and thrombophlebitis of the leg   . Pityriasis alba 01/29/2015  . Pure hypercholesterolemia 06/29/2016  . Rectum pain 09/12/2016  . Recurrent sinus infections 02/24/2016  . Rhinosinusitis 01/07/2016  . Rosacea   . Sinusitis 12/24/2015  . 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 Umbilical hernia without obstruction and without gangrene 01/29/2015  . Umbilical pain 13/07/6577  . Unspecified urinary incontinence 01/07/2016  . Urticaria   . UTI (urinary tract  infection)   . 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 Vitamin D deficiency     Past Surgical History:  Procedure Laterality Date  . ABDOMINAL HYSTERECTOMY     partial  . BREAST ENHANCEMENT SURGERY    . BREAST REDUCTION SURGERY Bilateral 2018  . COLONOSCOPY WITH PROPOFOL N/A 02/25/2013   Procedure: COLONOSCOPY WITH PROPOFOL;  Surgeon: Garlan Fair, MD;  Location: [facility];  Service: Endoscopy;  Laterality: N/A;  . mini facelift  2000  . NASAL SINUS SURGERY    . ROTATOR CUFF REPAIR     Right  . ROTATOR CUFF REPAIR     Left  . SEPTOPLASTY WITH ETHMOIDECTOMY, AND MAXILLARY ANTROSTOMY Bilateral 2006  . TOTAL ABDOMINAL HYSTERECTOMY    . TUBAL LIGATION    . umbicial hernia       Current Outpatient Medications on File Prior to Visit  Medication Sig Dispense Refill  . acyclovir ointment (ZOVIRAX) 5 % Apply 1 application topically daily as needed (for fever blister).    Marland Kitchen albuterol (VENTOLIN HFA) 108 (90 Base) MCG/ACT inhaler Inhale 2 puffs into the lungs every 6 (six) hours as needed for wheezing or shortness of breath. 18 g 5  . Alirocumab (PRALUENT) 75 MG/ML SOAJ Inject 75 mg into the skin every 14 (fourteen) days.    . cetirizine (ZYRTEC) 10 MG tablet Take 10 mg by mouth daily.    . Cholecalciferol (VITAMIN D3) 1000 units CAPS Take 1 capsule by mouth daily.    . clonazePAM (KLONOPIN) 1 MG tablet Take 1 tablet by mouth 2 (two) times daily as needed.  2  . Coenzyme Q10 (COQ10 PO) Take 300 mg by mouth daily.    . diclofenac sodium (VOLTAREN) 1 % GEL Apply 2 g topically 3 (three) times daily as needed.    . Estrad-Estriol-Testost-Progest (BI-EST PROGEST-TESTOSTERONE TD) Place onto the skin as directed.    Marland Kitchen estradiol (ESTRACE) 0.5 MG tablet Take 0.5 mg by mouth daily.  3  . levothyroxine (SYNTHROID) 100 MCG tablet Take 100 mcg by mouth daily before breakfast.    . Lidocaine, Anorectal, 5 % CREA Apply topically in the morning, at noon, in the  evening, and at bedtime.    Marland Kitchen MAGNESIUM CITRATE PO Take 150 mg by mouth daily.    . Multiple Vitamin (MULTIVITAMIN WITH MINERALS) TABS tablet Take 1 tablet by mouth daily.    . Omega-3 Fatty Acids (FISH OIL PO) Take 1,400 mg by mouth daily.    . polyethylene glycol (MIRALAX / GLYCOLAX) 17 g packet Take 17 g by mouth daily.    . Pseudoephedrine-guaiFENesin (GUAIFENESIN-PSEUDOEPHEDRINE PO) Take by mouth.    Teena Dunk Cherry 1200 MG CAPS Take 1 capsule by mouth in the morning and at bedtime.    . TGT PSYLLIUM FIBER PO Take 4 capsules by  mouth daily.    . Turmeric 500 MG TABS Take 1,000 mg by mouth in the morning and at bedtime.    . valACYclovir (VALTREX) 500 MG tablet Take 250 mg by mouth 2 (two) times daily as needed (Out breaks Fever blisters).     . zaleplon (SONATA) 10 MG capsule Take 10 mg by mouth at bedtime as needed.     No current facility-administered medications on file prior to visit.    Allergies  Allergen Reactions  . Adhesive [Tape] Dermatitis  . Citrus Nausea And Vomiting  . Escitalopram Oxalate Hives  . Niacin And Related     Rash and breathing breathing problems  . Amoxicillin Rash    Unsure of reaction/ was instructed not to take drug. Unknown reaction     Physical exam: 12-09-2020:  The patient underwent orthostatic blood pressure and heart rate measurements by nurse Mardene Celeste.  In supine position her blood pressure was 128/72 mmHg with a regular heart rate of 71 bpm.  As she went on to be seated she did feel slightly dizzy.  In a seated position her blood pressure was 131/64 mmHg with a regular heart rate of 72 bpm.  Once standing her blood pressure was 117/65 with a heart rate of 86 and again regular.    There is an appropriate increase in heart rate to compensate for the lower blood pressure the blood pressure between seated and standing position dropped by more than 10 mmHg which can be significant.   Wt Readings from Last 3 Encounters:  11/27/18 135 lb (61.2 kg)   10/04/18 133 lb (60.3 kg)  07/24/18 135 lb 6.4 oz (61.4 kg)     Ht Readings from Last 3 Encounters:  11/27/18 5\' 3"  (1.6 m)  10/04/18 5\' 3"  (1.6 m)  07/24/18 5\' 4"  (1.626 m)      General: The patient is awake, alert and appears not in acute distress. The patient is well groomed. Head: Normocephalic, atraumatic. Neck is tender - right occipital are and cervical  3-4-5,  right sided paraspinal tenderness, pain from palpation tere radiated into the elbow, antebrachium and thumb/ index-finger of the right hand.  Mallampati 1.  The patient also describes a relentless tinnitus nonpulsatile in the left ear. Rapid head movement and rotation flexion extension lead to an exacerbation of nystagmus with gaze to the left.  This is only a horizontal and not a vertical eye movement abnormality.,  neck circumference:13 inches . Nasal airflow  patent.  Retrognathia is not  seen.  Dental status: intact  Cardiovascular:  Regular rate and cardiac rhythm by pulse,  without distended neck veins. Respiratory: Lungs are clear to auscultation.  Skin:  Without evidence of ankle edema, or rash. Trunk: The patient's posture is erect.   Neurologic exam : The patient is awake and alert, oriented to place and time.   Memory subjective described as intact.  Attention span & concentration ability appears normal.  Speech is fluent,  without  dysarthria, dysphonia or aphasia.  Mood and affect are appropriate.   Cranial nerves: no loss of smell or taste reported  Pupils are equal and briskly reactive to light. Funduscopic- status post lens replacements.  Extraocular movements in vertical planes were intact and without nystagmus.     No Diplopia. She reports blurred vision, transiently.  Visual fields by finger perimetry are intact. Hearing was intact to soft voice and finger rubbing.    Facial sensation intact to fine touch.  Facial motor strength is symmetric  and tongue and uvula move midline.  Neck ROM :  impaired ROM -rotation, tilt and flexion extension were associated with crackling and tenderness, occipital and cervical- paraspinal tenderness on the right. The  shoulder shrug was symmetrical.    Motor exam:  Symmetric bulk, tone and ROM.   Normal tone without cog -wheeling, symmetrically weaker grip strength .   Sensory:  Fine touch and vibration were . Proprioception tested in the upper extremities was abnormal.  Coordination: Rapid alternating movements in the fingers/hands were of reduced speed, she reports her handwriting having changed.  The Finger-to-nose maneuver was impaired - evidence of ataxia, dysmetria but not tremor.    Gait and station: Patient could rise unassisted from a seated position, walked very,very slow but without assistive device.  Stance is of normal width/ base and the patient turned with 4 steps.  Toe and heel walk were deferred.  Deep tendon reflexes: in the upper and lower extremities are symmetric, at 2 plus /intact.  Babinski response was deferred.      After spending a total time of  65minutes face to face and additional time for physical and neurologic examination, review of laboratory studies,  personal review of imaging studies, reports and results of other testing and review of referral information / records as far as provided in visit, I have established the following assessments:   The cause of falls is still indeterminate - but with the falls she has suffered concussion times 2 .   1) post-traumatic neck / occipital injury- TBI . resulting in post-concussive vertigo, tinnitus and some memory changes.    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     3) Nystagmus to the right - manifesting as vertigo with abnormal after movements to left and right.    My Plan is to proceed  with:  1)d/c klonpin and starta Xanax for vertigo 2)MRI brain and cervical spine,  3)NCV and EMG right arm and paraspinal area.  4)  vestibular rehab.  5) Aricept for cognitive symptoms, aphasia, memory.  5 mg bid po. This is a monthly trial based on anecdotal evidence .   I would like to thank Josetta Huddle, MD and Josetta Huddle, [place] [address] [place],  [place] [address] for allowing me to meet with and to take care of this pleasant patient.   In short, Wendy Joyce is presenting with a postconcussion manifestation, and I will follow up in 3 month.  CC: I will share my notes with PCP.  Electronically signed by: Larey Seat, MD 12/09/2020 9:55 AM  [facility] Board certified by The [employer] of Sleep Medicine and Diplomate of the [employer] of Sleep Medicine. Board certified In Neurology through the [place], Fellow of the [employer] of Neurology. Medical Director of [employer].

## 2020-12-09 NOTE — Telephone Encounter (Signed)
[facility] team/[employer] Josem Kaufmann: [phone] (exp. 12/09/20 to 06/06/21) order sent to GI. They will reach out to the patient to schedule.

## 2020-12-10 ENCOUNTER — Encounter (INDEPENDENT_AMBULATORY_CARE_PROVIDER_SITE_OTHER): Payer: PPO | Admitting: Diagnostic Neuroimaging

## 2020-12-10 ENCOUNTER — Ambulatory Visit (INDEPENDENT_AMBULATORY_CARE_PROVIDER_SITE_OTHER): Payer: PPO | Admitting: Diagnostic Neuroimaging

## 2020-12-10 DIAGNOSIS — S060X1A Concussion with loss of consciousness of 30 minutes or less, initial encounter: Secondary | ICD-10-CM

## 2020-12-10 DIAGNOSIS — S069XAA Unspecified intracranial injury with loss of consciousness status unknown, initial encounter: Secondary | ICD-10-CM

## 2020-12-10 DIAGNOSIS — Z0289 Encounter for other administrative examinations: Secondary | ICD-10-CM

## 2020-12-10 DIAGNOSIS — R42 Dizziness and giddiness: Secondary | ICD-10-CM

## 2020-12-10 DIAGNOSIS — M5412 Radiculopathy, cervical region: Secondary | ICD-10-CM

## 2020-12-10 DIAGNOSIS — S069X0A Unspecified intracranial injury without loss of consciousness, initial encounter: Secondary | ICD-10-CM

## 2020-12-10 DIAGNOSIS — R296 Repeated falls: Secondary | ICD-10-CM

## 2020-12-10 NOTE — Progress Notes (Signed)
Needle examination of right upper extremity is normal.   IMPRESSION:   Normal study.  No electrodiagnostic evidence of large fiber neuropathy at this time.      INTERPRETING PHYSICIAN:  Penni Bombard, MD

## 2020-12-10 NOTE — Procedures (Signed)
   [facility]  NCS (NERVE CONDUCTION STUDY) WITH EMG (ELECTROMYOGRAPHY) REPORT   STUDY DATE: 12/10/20 PATIENT NAME: Wendy Joyce DOB: 04/09/1946 MRN: [id number]  ORDERING CLINICIAN: Larey Seat, MD   TECHNOLOGIST: Sherre Scarlet ELECTROMYOGRAPHER: Earlean Polka. Kendric Sindelar, MD  CLINICAL INFORMATION: 74 year old female with right arm pain.  FINDINGS: NERVE CONDUCTION STUDY:  Right median and ulnar motor responses are normal.  Right median and ulnar sensory responses are normal.  Right median to ulnar transcarpal comparison study is normal.  Right ulnar F-wave latency is normal.   NEEDLE ELECTROMYOGRAPHY:  Needle examination of right upper extremity is normal.   IMPRESSION:   Normal study.  No electrodiagnostic evidence of large fiber neuropathy at this time.     INTERPRETING PHYSICIAN:  Penni Bombard, MD Certified in Neurology, Neurophysiology and Neuroimaging  [facility] [address] [place], [place] [address] [phone]   [facility]    Nerve / Sites Muscle Latency Ref. Amplitude Ref. Rel Amp Segments Distance Velocity Ref. Area    ms ms mV mV %  cm m/s m/s mVms  R Median - APB     Wrist APB 3.5 ?4.4 5.0 ?4.0 100 Wrist - APB 7   16.9     Upper arm APB 7.4  4.7  93.5 Upper arm - Wrist 20 52 ?49 16.3  R Ulnar - ADM     Wrist ADM 3.0 ?3.3 11.0 ?6.0 100 Wrist - ADM 7   32.5     B.Elbow ADM 6.0  10.8  97.9 B.Elbow - Wrist 18 60 ?49 31.8     A.Elbow ADM 7.6  10.1  93.9 A.Elbow - B.Elbow 10 60 ?49 29.8         SNC    Nerve / Sites Rec. Site Peak Lat Ref.  Amp Ref. Segments Distance Peak Diff Ref.    ms ms V V  cm ms ms  R Median, Ulnar - Transcarpal comparison     Median Palm Wrist 2.3 ?2.2 115 ?35 Median Palm - Wrist 8       Ulnar Palm Wrist 2.4 ?2.2 33 ?12 Ulnar Palm - Wrist 8          Median Palm - Ulnar Palm  -0.1 ?0.4  R Median - Orthodromic (Dig II, Mid palm)     Dig II Wrist 3.3 ?3.4 12 ?10 Dig II  - Wrist 13    R Ulnar - Orthodromic, (Dig V, Mid palm)     Dig V Wrist 3.0 ?3.1 13 ?5 Dig V - Wrist 39             F  Wave    Nerve F Lat Ref.   ms ms  R Ulnar - ADM 28.2 ?32.0       EMG Summary Table    Spontaneous MUAP Recruitment  Muscle IA Fib PSW Fasc Other Amp Dur. Poly Pattern  R. Deltoid Normal None None None _______ Normal Normal Normal Normal  R. Biceps brachii Normal None None None _______ Normal Normal Normal Normal  R. Triceps brachii Normal None None None _______ Normal Normal Normal Normal  R. Flexor carpi radialis Normal None None None _______ Normal Normal Normal Normal  R. First dorsal interosseous Normal None None None _______ Normal Normal Normal Normal

## 2020-12-11 ENCOUNTER — Ambulatory Visit (INDEPENDENT_AMBULATORY_CARE_PROVIDER_SITE_OTHER): Payer: PPO

## 2020-12-11 ENCOUNTER — Encounter: Payer: Self-pay | Admitting: Cardiology

## 2020-12-11 ENCOUNTER — Other Ambulatory Visit: Payer: Self-pay

## 2020-12-11 ENCOUNTER — Ambulatory Visit: Payer: PPO | Admitting: Cardiology

## 2020-12-11 VITALS — BP 132/60 | HR 62 | Ht 63.0 in | Wt 134.0 lb

## 2020-12-11 DIAGNOSIS — R55 Syncope and collapse: Secondary | ICD-10-CM

## 2020-12-11 NOTE — Patient Instructions (Signed)
Medication Instructions:  The current medical regimen is effective;  continue present plan and medications.  *If you need a refill on your cardiac medications before your next appointment, please call your pharmacy*  Testing/Procedures: ZIO AT Long term monitor-Live Telemetry  Your physician has requested you wear a ZIO patch monitor for 14 days.  This is a single patch monitor. [employer] supplies one patch monitor per enrollment. Additional stickers are not available.  Please do not apply patch if you will be having a Nuclear Stress Test, Echocardiogram, Cardiac CT, MRI, or Chest Xray during the time frame you would be wearing the monitor. The patch cannot be worn during these tests. You cannot remove and re-apply the ZIO AT patch monitor.   Your ZIO patch monitor will be sent Fed Ex from [employer] directly to your home address. The monitor may also be mailed to a PO BOX if home delivery is not available. It may take 3-5 days to receive your monitor after you have been enrolled.  Once you have received you monitor, please review enclosed instructions. Your monitor has already been registered assigning a specific monitor serial # to you.   Applying the monitor  Shave hair from upper left chest.  Hold abrader disc by orange tab. Rub abrader in 40 strokes over left upper chest as indicated in your monitor instructions.  Clean area with 4 enclosed alcohol pads. Use all pads to ensure the area is cleaned thoroughly. Let dry.  Apply patch as indicated in monitor instructions. Patch will be placed under collarbone on left side of chest with arrow pointing upward.  Rub patch adhesive wings for 2 minutes. Remove the white label marked "1". Remove the white label marked "2". Rub patch adhesive wings for 2 additional minutes.  While looking in a mirror, press and release button in center of patch. A small green light will flash 3-4 times. This will be your only indicator the monitor has been  turned on.  Do not shower for the first 24 hours. You may shower after the first 24 hours.  Press the button if you feel a symptom. You will hear a small click. Record Date, Time and Symptom in the Patient Log.   Starting the Gateway  In your kit there is a Hydrographic surveyor box the size of a cellphone. This is Airline pilot. It transmits all your recorded data to [facility] This box must stay within 10 feet of you at all times. Open the box and push the * button. There will be a light that blinks orange and then green a few times. When the light stops blinking, the Gateway is connected to the ZIO patch.  Call [employer] at [phone] to confirm your monitor is transmitting.   Returning your monitor  Remove your patch and place it inside the [place]. In the lower half of the Gateway there is a white bag with prepaid postage on it. Place Gateway in bag and seal. Mail package back to [place] as soon as possible. Your physician should have your final report approximately 7 days after you have mailed back your monitor.   Call [place] at [phone] if you have questions regarding your ZIO AT patch monitor. Call them immediately if you see an orange light blinking on your monitor.  If your monitor falls off in less than 4 days contact our Monitor department at [phone]. If your monitor becomes loose or falls off after 4 days call [employer] at [phone] for suggestions on  securing your monitor.   Follow-Up: At [facility], you and your health needs are our priority.  As part of our continuing mission to provide you with exceptional heart care, we have created designated Provider Care Teams.  These Care Teams include your primary Cardiologist (physician) and Advanced Practice Providers (APPs -  Physician Assistants and Nurse Practitioners) who all work together to provide you with the care you need, when you need it.  We recommend signing up for the patient portal called  "MyChart".  Sign up information is provided on this After Visit Summary.  MyChart is used to connect with patients for Virtual Visits (Telemedicine).  Patients are able to view lab/test results, encounter notes, upcoming appointments, etc.  Non-urgent messages can be sent to your provider as well.   To learn more about what you can do with MyChart, go to [email or web address].    Your next appointment:   Follow up will be determined after the results of the above testing.  Thank you for choosing [facility]!!

## 2020-12-11 NOTE — Progress Notes (Signed)
Cardiology Office Note:    Date:  12/11/2020   ID:  Wendy Joyce, DOB April 03, 1946, MRN [id number]  PCP:  Josetta Huddle, MD  [facility] Cardiologist:  Candee Furbish, MD  [facility] Electrophysiologist:  None   Referring MD: Dohmeier, Asencion Partridge, MD     History of Present Illness:    Wendy Joyce is a 74 y.o. female here for the evaluation of loss of consciousness at the request of Dr. Inda Merlin.  In review of neurology note from 2 days ago, on November 06, 2020 she was in a dressing room shopping tried on a pair of jeans while standing and she lost balance but was unable to break the fall and saw herself falling to the ground backwards.  She describes it like a rock hit the back of her head however the floor was carpeted and she had the wall-mounted mirror.  She does not think that she had any loss of consciousness or loss of awareness.  She got herself up without assistance.  A second fall that occurred on December 3 where she was sitting down on a stool while dressing and was putting on the leggings, stood up slowly to reach for the clock and fell backwards again.  She felt as though she watched herself in slow motion.  Hit her head on the marble floor and the closet door.  She also injured her right shoulder elbow hip hand.  Orthostatic blood pressures were performed at neurology.  Laying 128/72, 71, seated she felt slightly dizzy 131/64, 72, standing 117/65, 86.  In review of prior note from 2019 in the office:  In review of Dr. Geraldo Docker note from 05/02/2018 she has a history of left anterior fascicular block and dyspnea on exertion and she is concerned about the possibility of coronary artery disease.  She has had some other issues such as irritable bowel syndrome, chronic pain anxiety  Summer of 2018 in [place], [employer], climbing hill. Could not catch her breath. Head was stopped up and chest tight.  Works out on Camera [employer] now with trainer 30 minutes without  stopping. Likes to dance but starts to tightenen.    Her father died at age 86 but had a long-standing history of CAD, had 3v CABG, CVA in 2009. Mother had angina.  Non-smoker.  TSH was low and free T4 was high suggestive of too much thyroid hormone  Back in 2005 she had a negative work-up for CAD.  Waves of fatigue and heaviness 500 pounds like going up stairs.   Past Medical History:  Diagnosis Date  . Abdominal pain, generalized 01/29/2015  . Abnormal thyroid function test 02/25/2016  . ADHD (attention deficit hyperactivity disorder)   . Allergic rhinitis, unspecified 01/29/2015  . Ankle sprain 03/16/2016  . Anxiety 01/29/2015  . Arthritis    hands  . Chronic kidney disease 2012   kidney stones  . Constipation 01/29/2015  . Decreased libido 05/02/2018  . Disorder of the skin and subcutaneous tissue, unspecified 01/29/2015  . DJD (degenerative joint disease)    hand  . Dysthymia   . Endometriosis   . External otitis   . Family history of coronary artery disease 05/02/2018  . Fatigue due to excessive exertion 02/24/2016  . Fever blister   . Fibromyalgia 01/29/2015  . Frequency of micturition 01/29/2015  . Functional dyspepsia 01/29/2015  . Gastro-esophageal reflux disease without esophagitis 01/29/2015  . Headache(784.0)   . High cholesterol   . Hyperlipidemia 09/12/2016  . Hypersomnia   .  Hypothyroidism   . IBS (irritable bowel syndrome) 05/02/2018  . Insomnia   . Internal hemorrhoids   . 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 Labial pain 11/30/2016  . Lactose intolerance 12/14/2016  . Lactose intolerance in adult 09/12/2016  . 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 Left ureteral stone   . LP (lichen planus) 74/94/4967  . Major depressive disorder, single episode, unspecified   . Migraine with aura, not intractable, without status migrainosus   . Mixed hyperlipidemia   . Mouth pain 01/07/2016  . Osteoarthritis of hip   . Osteopenia  determined by x-ray 03/02/2017  . Personal history of estrogen therapy   . Phlebitis and thrombophlebitis of the leg   . Pityriasis alba 01/29/2015  . Pure hypercholesterolemia 06/29/2016  . Rectum pain 09/12/2016  . Recurrent sinus infections 02/24/2016  . Rhinosinusitis 01/07/2016  . Rosacea   . Sinusitis 12/24/2015  . 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 Umbilical hernia without obstruction and without gangrene 01/29/2015  . Umbilical pain 59/16/3846  . Unspecified urinary incontinence 01/07/2016  . Urticaria   . UTI (urinary tract infection)   . 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 Vitamin D deficiency     Past Surgical History:  Procedure Laterality Date  . ABDOMINAL HYSTERECTOMY     partial  . BREAST ENHANCEMENT SURGERY    . BREAST REDUCTION SURGERY Bilateral 2018  . COLONOSCOPY WITH PROPOFOL N/A 02/25/2013   Procedure: COLONOSCOPY WITH PROPOFOL;  Surgeon: Garlan Fair, MD;  Location: [facility];  Service: Endoscopy;  Laterality: N/A;  . mini facelift  2000  . NASAL SINUS SURGERY    . ROTATOR CUFF REPAIR     Right  . ROTATOR CUFF REPAIR     Left  . SEPTOPLASTY WITH ETHMOIDECTOMY, AND MAXILLARY ANTROSTOMY Bilateral 2006  . TOTAL ABDOMINAL HYSTERECTOMY    . TUBAL LIGATION    . umbicial hernia      Current Medications: Current Meds  Medication Sig  . acyclovir ointment (ZOVIRAX) 5 % Apply 1 application topically daily as needed (for fever blister).  Marland Kitchen albuterol (VENTOLIN HFA) 108 (90 Base) MCG/ACT inhaler Inhale 2 puffs into the lungs every 6 (six) hours as needed for wheezing or shortness of breath.  . Alirocumab (PRALUENT) 75 MG/ML SOAJ Inject 75 mg into the skin every 14 (fourteen) days.  . ALPRAZolam (XANAX) 0.5 MG tablet Take 1 tablet (0.5 mg total) by mouth at bedtime as needed (vertigo prn).  . cetirizine (ZYRTEC) 10 MG tablet Take 10 mg by mouth daily.  . Cholecalciferol (VITAMIN D3) 1000 units CAPS Take 1 capsule  by mouth daily.  . Coenzyme Q10 (COQ10 PO) Take 300 mg by mouth daily.  . diclofenac sodium (VOLTAREN) 1 % GEL Apply 2 g topically 3 (three) times daily as needed.  . donepezil (ARICEPT) 5 MG tablet Take 1 tablet (5 mg total) by mouth 2 (two) times daily.  . Estrad-Estriol-Testost-Progest (BI-EST PROGEST-TESTOSTERONE TD) Place onto the skin as directed.  Marland Kitchen estradiol (ESTRACE) 0.5 MG tablet Take 0.5 mg by mouth daily.  Marland Kitchen levothyroxine (SYNTHROID) 100 MCG tablet Take 100 mcg by mouth daily before breakfast.  . Lidocaine, Anorectal, 5 % CREA Apply topically in the morning, at noon, in the evening, and at bedtime.  Marland Kitchen LINZESS 145 MCG CAPS capsule Take 145 mcg by mouth daily.  Marland Kitchen MAGNESIUM CITRATE PO Take 150 mg by mouth daily.  . Multiple Vitamin (MULTIVITAMIN WITH MINERALS) TABS tablet Take  1 tablet by mouth daily.  . Omega-3 Fatty Acids (FISH OIL PO) Take 1,400 mg by mouth daily.  . polyethylene glycol (MIRALAX / GLYCOLAX) 17 g packet Take 17 g by mouth daily.  . Pseudoephedrine-guaiFENesin (GUAIFENESIN-PSEUDOEPHEDRINE PO) Take by mouth.  Teena Dunk Cherry 1200 MG CAPS Take 1 capsule by mouth in the morning and at bedtime.  . TGT PSYLLIUM FIBER PO Take 4 capsules by mouth daily.  . Turmeric 500 MG TABS Take 1,000 mg by mouth in the morning and at bedtime.  . valACYclovir (VALTREX) 500 MG tablet Take 250 mg by mouth 2 (two) times daily as needed (Out breaks Fever blisters).   . zaleplon (SONATA) 10 MG capsule Take 10 mg by mouth at bedtime as needed.     Allergies:   Adhesive [tape], Citrus, Escitalopram oxalate, Niacin and related, and Amoxicillin   Social History   Socioeconomic History  . Marital status: Married    Spouse name: Not on file  . Number of children: Not on file  . Years of education: Not on file  . Highest education level: Not on file  Occupational History  . Not on file  Tobacco Use  . Smoking status: Never Smoker  . Smokeless tobacco: Never Used  Substance and Sexual  Activity  . Alcohol use: Yes    Alcohol/week: 1.0 standard drink    Types: 1 Glasses of wine per week    Comment: every night  . Drug use: No  . Sexual activity: Yes    Partners: Male  Other Topics Concern  . Not on file  Social History Narrative   Married   Regular exercise: yes; 2 x a week with a trainer   Caffeine use: coffee daily   Social Determinants of Health   Financial Resource Strain: Not on file  Food Insecurity: Not on file  Transportation Needs: Not on file  Physical Activity: Not on file  Stress: Not on file  Social Connections: Not on file    ROS:   Please see the history of present illness.     All other systems reviewed and are negative.  EKGs/Labs/Other Studies Reviewed:    The following studies were reviewed today:  Coronary CT 09/10/2018:  1. Coronary calcium score of 269. This was 47 percentile for age and sex matched control.  2. Normal coronary origin with right dominance.  3. Mild diffuse CAD, moderate stenosis in the proximal LAD. Additional analysis with CT FFR will be submitted.   IMPRESSION: 1.  CT FFR analysis didn't show any significant stenosis.  Echocardiogram 07/26/18:   - Left ventricle: The cavity size was normal. Systolic function was  normal. The estimated ejection fraction was in the range of 60%  to 65%. Wall motion was normal; there were no regional wall  motion abnormalities. Left ventricular diastolic function  parameters were normal.  - Atrial septum: No defect or patent foramen ovale was identified.  - Impressions: Normal GLS -24.1.   Impressions:   - Normal GLS -24.1.   EKG:  EKG is  ordered today.  The ekg ordered today demonstrates sinus rhythm 62 borderline interventricular conduction delay, left anterior fascicular block  Recent Labs: No results found for requested labs within last 8760 hours.  Recent Lipid Panel No results found for: CHOL, TRIG, HDL, CHOLHDL, VLDL, LDLCALC, LDLDIRECT   Risk  Assessment/Calculations:       Physical Exam:    VS:  BP 132/60 (BP Location: Left Arm, Patient Position: Sitting, Cuff Size: Normal)  Pulse 62   Ht '5\' 3"'  (1.6 m)   Wt 134 lb (60.8 kg)   SpO2 98%   BMI 23.74 kg/m     Wt Readings from Last 3 Encounters:  12/11/20 134 lb (60.8 kg)  12/09/20 132 lb (59.9 kg)  11/27/18 135 lb (61.2 kg)     GEN:  Well nourished, well developed in no acute distress HEENT: Normal NECK: No JVD; No carotid bruits LYMPHATICS: No lymphadenopathy CARDIAC: RRR, no murmurs, rubs, gallops RESPIRATORY:  Clear to auscultation without rales, wheezing or rhonchi  ABDOMEN: Soft, non-tender, non-distended MUSCULOSKELETAL:  No edema; No deformity  SKIN: Warm and dry NEUROLOGIC:  Alert and oriented x 3 PSYCHIATRIC:  Normal affect   ASSESSMENT:    1. Syncope and collapse    PLAN:    In order of problems listed above:  Fall/possible syncope -First fall, saw herself going down but did not have the ability to catch herself.   -Second fall while getting up in an instant, she was on the ground.  No warning.  Does not recall having any prodrome. -Interestingly she is also had intermittent sensation of feeling heavy when going up stairs for instance to her studio.  She states that she feels like she is 500 pounds trying to lift her legs up the stairs. -She does have a left anterior fascicular block on EKG.  I will check a Zio patch monitor for 14 days to make sure that there is no evidence of dangerous pauses or bradycardia arrhythmias. -Her recent extensive work-up including coronary CT scan and echocardiogram were overall reassuring previously.  She does have coronary plaque and is currently taking Praluent. She has an upcoming MRI on Sunday for her brain.  We will follow-up with results of testing.        Medication Adjustments/Labs and Tests Ordered: Current medicines are reviewed at length with the patient today.  Concerns regarding medicines are  outlined above.  Orders Placed This Encounter  Procedures  . LONG TERM MONITOR (3-14 DAYS)  . EKG 12-Lead   No orders of the defined types were placed in this encounter.   Patient Instructions  Medication Instructions:  The current medical regimen is effective;  continue present plan and medications.  *If you need a refill on your cardiac medications before your next appointment, please call your pharmacy*  Testing/Procedures: ZIO AT Long term monitor-Live Telemetry  Your physician has requested you wear a ZIO patch monitor for 14 days.  This is a single patch monitor. [employer] supplies one patch monitor per enrollment. Additional stickers are not available.  Please do not apply patch if you will be having a Nuclear Stress Test, Echocardiogram, Cardiac CT, MRI, or Chest Xray during the time frame you would be wearing the monitor. The patch cannot be worn during these tests. You cannot remove and re-apply the ZIO AT patch monitor.   Your ZIO patch monitor will be sent Fed Ex from [employer] directly to your home address. The monitor may also be mailed to a PO BOX if home delivery is not available. It may take 3-5 days to receive your monitor after you have been enrolled.  Once you have received you monitor, please review enclosed instructions. Your monitor has already been registered assigning a specific monitor serial # to you.   Applying the monitor  Shave hair from upper left chest.  Hold abrader disc by orange tab. Rub abrader in 40 strokes over left upper chest as indicated in your monitor  instructions.  Clean area with 4 enclosed alcohol pads. Use all pads to ensure the area is cleaned thoroughly. Let dry.  Apply patch as indicated in monitor instructions. Patch will be placed under collarbone on left side of chest with arrow pointing upward.  Rub patch adhesive wings for 2 minutes. Remove the white label marked "1". Remove the white label marked "2". Rub patch adhesive  wings for 2 additional minutes.  While looking in a mirror, press and release button in center of patch. A small green light will flash 3-4 times. This will be your only indicator the monitor has been turned on.  Do not shower for the first 24 hours. You may shower after the first 24 hours.  Press the button if you feel a symptom. You will hear a small click. Record Date, Time and Symptom in the Patient Log.   Starting the Gateway  In your kit there is a Hydrographic surveyor box the size of a cellphone. This is Airline pilot. It transmits all your recorded data to [facility]. This box must stay within 10 feet of you at all times. Open the box and push the * button. There will be a light that blinks orange and then green a few times. When the light stops blinking, the Gateway is connected to the ZIO patch.  Call [employer] at [phone] to confirm your monitor is transmitting.   Returning your monitor  Remove your patch and place it inside the [place]. In the lower half of the Gateway there is a white bag with prepaid postage on it. Place Gateway in bag and seal. Mail package back to [place] as soon as possible. Your physician should have your final report approximately 7 days after you have mailed back your monitor.   Call [employer] at [phone] if you have questions regarding your ZIO AT patch monitor. Call them immediately if you see an orange light blinking on your monitor.  If your monitor falls off in less than 4 days contact our Monitor department at [phone]. If your monitor becomes loose or falls off after 4 days call [employer] at [phone] for suggestions on securing your monitor.   Follow-Up: At [facility], you and your health needs are our priority.  As part of our continuing mission to provide you with exceptional heart care, we have created designated Provider Care Teams.  These Care Teams include your primary Cardiologist (physician) and Advanced Practice  Providers (APPs -  Physician Assistants and Nurse Practitioners) who all work together to provide you with the care you need, when you need it.  We recommend signing up for the patient portal called "MyChart".  Sign up information is provided on this After Visit Summary.  MyChart is used to connect with patients for Virtual Visits (Telemedicine).  Patients are able to view lab/test results, encounter notes, upcoming appointments, etc.  Non-urgent messages can be sent to your provider as well.   To learn more about what you can do with MyChart, go to [email or web address].    Your next appointment:   Follow up will be determined after the results of the above testing.  Thank you for choosing [facility]!!           Signed, Candee Furbish, MD  12/11/2020 2:56 PM    [place]

## 2020-12-13 ENCOUNTER — Ambulatory Visit
Admission: RE | Admit: 2020-12-13 | Discharge: 2020-12-13 | Disposition: A | Discharge status: Home / Self Care | Payer: PPO | Source: Ambulatory Visit | Attending: Neurology | Admitting: Neurology

## 2020-12-13 ENCOUNTER — Other Ambulatory Visit: Payer: Self-pay

## 2020-12-13 DIAGNOSIS — R296 Repeated falls: Secondary | ICD-10-CM | POA: Diagnosis not present

## 2020-12-13 DIAGNOSIS — S060X1A Concussion with loss of consciousness of 30 minutes or less, initial encounter: Secondary | ICD-10-CM

## 2020-12-13 DIAGNOSIS — S069X9A Unspecified intracranial injury with loss of consciousness of unspecified duration, initial encounter: Secondary | ICD-10-CM | POA: Diagnosis not present

## 2020-12-13 DIAGNOSIS — S069XAA Unspecified intracranial injury with loss of consciousness status unknown, initial encounter: Secondary | ICD-10-CM

## 2020-12-13 DIAGNOSIS — R42 Dizziness and giddiness: Secondary | ICD-10-CM

## 2020-12-13 DIAGNOSIS — S069X0A Unspecified intracranial injury without loss of consciousness, initial encounter: Secondary | ICD-10-CM

## 2020-12-13 MED ORDER — GADOBENATE DIMEGLUMINE 529 MG/ML IV SOLN
12.0000 mL | Freq: Once | INTRAVENOUS | Status: AC | PRN
  Administered 2020-12-13: 12 mL via INTRAVENOUS

## 2020-12-14 DIAGNOSIS — R55 Syncope and collapse: Secondary | ICD-10-CM | POA: Diagnosis not present

## 2020-12-15 ENCOUNTER — Telehealth: Payer: Self-pay | Admitting: Neurology

## 2020-12-15 ENCOUNTER — Encounter: Payer: Self-pay | Admitting: Neurology

## 2020-12-15 ENCOUNTER — Ambulatory Visit: Payer: PPO | Admitting: Diagnostic Neuroimaging

## 2020-12-15 NOTE — Telephone Encounter (Signed)
I called the patient.  MRI of the brain is essentially normal for age.  MRI of the cervical spine does show some multilevel degenerative changes, but no traumatic structural injury.  Spinal cord is unremarkable.  The patient will be entering into physical therapy.   MRI cervical 12/13/20:  IMPRESSION: This MRI of the cervical spine with and without contrast shows the following: 1.  Multilevel degenerative changes as detailed above that do not lead to spinal stenosis or nerve root compression. 2.  3 mm retrolisthesis of C5 upon C6 and 1 mm anterolisthesis of C6 upon C7. 3.  The spinal cord appears normal 4.   Normal enhancement pattern.   MRI brain 12/13/20:   IMPRESSION: This MRI of the brain with and without contrast shows the following: 1.   Mild generalized cortical atrophy, typical for age. 2.   Few punctate T2/FLAIR hyperintense foci in the hemispheres consistent with negligible chronic microvascular ischemic change. 3.   No acute findings. 4.   Normal enhancement pattern.

## 2020-12-15 NOTE — Telephone Encounter (Signed)
-----   Message from Darleen Crocker, RN sent at 12/14/2020  2:17 PM EST ----- Please result for Dr Brett Fairy, Thanks ----- Message ----- From: Britt Bottom, MD Sent: 12/13/2020   4:42 PM EST To: Larey Seat, MD

## 2020-12-16 DIAGNOSIS — G2581 Restless legs syndrome: Secondary | ICD-10-CM | POA: Diagnosis not present

## 2020-12-16 DIAGNOSIS — J45901 Unspecified asthma with (acute) exacerbation: Secondary | ICD-10-CM | POA: Diagnosis not present

## 2020-12-16 DIAGNOSIS — M791 Myalgia, unspecified site: Secondary | ICD-10-CM | POA: Diagnosis not present

## 2020-12-16 DIAGNOSIS — G4700 Insomnia, unspecified: Secondary | ICD-10-CM | POA: Diagnosis not present

## 2020-12-16 DIAGNOSIS — R55 Syncope and collapse: Secondary | ICD-10-CM | POA: Diagnosis not present

## 2020-12-16 DIAGNOSIS — F0390 Unspecified dementia without behavioral disturbance: Secondary | ICD-10-CM | POA: Diagnosis not present

## 2020-12-16 DIAGNOSIS — E782 Mixed hyperlipidemia: Secondary | ICD-10-CM | POA: Diagnosis not present

## 2020-12-16 DIAGNOSIS — Z79890 Hormone replacement therapy (postmenopausal): Secondary | ICD-10-CM | POA: Diagnosis not present

## 2020-12-16 DIAGNOSIS — J4540 Moderate persistent asthma, uncomplicated: Secondary | ICD-10-CM | POA: Diagnosis not present

## 2020-12-16 DIAGNOSIS — H539 Unspecified visual disturbance: Secondary | ICD-10-CM | POA: Diagnosis not present

## 2020-12-16 DIAGNOSIS — K589 Irritable 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: Secondary | ICD-10-CM | POA: Diagnosis not present

## 2020-12-16 DIAGNOSIS — J45990 Exercise induced bronchospasm: Secondary | ICD-10-CM | POA: Diagnosis not present

## 2020-12-23 ENCOUNTER — Ambulatory Visit: Payer: PPO | Attending: Neurology

## 2020-12-23 ENCOUNTER — Other Ambulatory Visit: Payer: Self-pay

## 2020-12-23 DIAGNOSIS — R2689 Other abnormalities of gait and mobility: Secondary | ICD-10-CM | POA: Diagnosis not present

## 2020-12-23 DIAGNOSIS — R42 Dizziness and giddiness: Secondary | ICD-10-CM | POA: Insufficient documentation

## 2020-12-23 DIAGNOSIS — R2681 Unsteadiness on feet: Secondary | ICD-10-CM | POA: Insufficient documentation

## 2020-12-23 NOTE — Therapy (Signed)
[facility] [address] [place], [place], [address] Phone: [phone]   Fax:  [phone]  Physical Therapy Evaluation  Patient Details  Name: Wendy Joyce MRN: [id number] Date of Birth: 1946-12-09 Referring Provider (PT): Porfirio Mylar Dohmeier   Encounter Date: 12/23/2020   PT End of Session - 12/23/20 1106    Visit Number 1    Number of Visits 17    Date for PT Re-Evaluation 03/23/21   60 day poc, 90 day cert   Authorization Type [employer] medicare so 10th visit progress note    PT Start Time 1101    PT Stop Time 1154    PT Time Calculation (min) 53 min    Activity Tolerance Patient tolerated treatment well    Behavior During Therapy Anxious           Past Medical History:  Diagnosis Date   Abdominal pain, generalized 01/29/2015   Abnormal thyroid function test 02/25/2016   ADHD (attention deficit hyperactivity disorder)    Allergic rhinitis, unspecified 01/29/2015   Ankle sprain 03/16/2016   Anxiety 01/29/2015   Arthritis    hands   Chronic kidney disease 2012   kidney stones   Constipation 01/29/2015   Decreased libido 05/02/2018   Disorder of the skin and subcutaneous tissue, unspecified 01/29/2015   DJD (degenerative joint disease)    hand   Dysthymia    Endometriosis    External otitis    Family history of coronary artery disease 05/02/2018   Fatigue due to excessive exertion 02/24/2016   Fever blister    Fibromyalgia 01/29/2015   Frequency of micturition 01/29/2015   Functional dyspepsia 01/29/2015   Gastro-esophageal reflux disease without esophagitis 01/29/2015   Headache(784.0)    High cholesterol    Hyperlipidemia 09/12/2016   Hypersomnia    Hypothyroidism    IBS (irritable bowel syndrome) 05/02/2018   Insomnia    Internal hemorrhoids    Irritable 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 01/29/2015   Labial pain 11/30/2016   Lactose intolerance 12/14/2016    Lactose intolerance in adult 09/12/2016   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   Left ureteral stone    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   Major depressive disorder, single episode, unspecified    Migraine with aura, not intractable, without status migrainosus    Mixed hyperlipidemia    Mouth pain 01/07/2016   Osteoarthritis of hip    Osteopenia determined by x-ray 03/02/2017   Personal history of estrogen therapy    Phlebitis and thrombophlebitis of the leg    Pityriasis alba 01/29/2015   Pure hypercholesterolemia 06/29/2016   Rectum pain 09/12/2016   Recurrent sinus infections 02/24/2016   Rhinosinusitis 01/07/2016   Rosacea    Sinusitis 12/24/2015   Tinnitus of left ear 01/29/2015   Umbilical hernia without obstruction and without gangrene 01/29/2015   Umbilical pain 05/02/2018   Unspecified urinary incontinence 01/07/2016   Urticaria    UTI (urinary tract infection)    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   Vitamin D deficiency     Past Surgical History:  Procedure Laterality Date   ABDOMINAL HYSTERECTOMY     partial   BREAST ENHANCEMENT SURGERY     BREAST REDUCTION SURGERY Bilateral 2018   COLONOSCOPY WITH PROPOFOL N/A 02/25/2013   Procedure: COLONOSCOPY WITH PROPOFOL;  Surgeon: Charolett Bumpers, MD;  Location: [facility];  Service: Endoscopy;  Laterality: N/A;   mini facelift  2000   NASAL SINUS SURGERY  ROTATOR CUFF REPAIR     Right   ROTATOR CUFF REPAIR     Left   SEPTOPLASTY WITH ETHMOIDECTOMY, AND MAXILLARY ANTROSTOMY Bilateral 2006   TOTAL ABDOMINAL HYSTERECTOMY     TUBAL LIGATION     umbicial hernia      There were no vitals filed for this visit.    Subjective Assessment - 12/23/20 1106    Subjective Pt referred after 2 unexplained falls.Pt had fall 11/06/20 hitting head on ground and another fall backwards 11/27/20 in which she also injured right shoulder/elbow/hip and  hand with recent surgival intervention for right hand. Hit back of head both times. First fall was putting on pants and lost balance and could not move to catch herself. 2nd fall has just stood up slowly and reached to hand up robe and fell backwards again with no warning or reaction. Diagnosed with concussion, TBI, vertigo. Pt reports that right side of neck at base of head has been sore. She has also been having some trouble with left eye. Pt also reports being awoken by extreme pains in left leg. She is very concerned that it could be a blood clot. PT advised she could follow-up with PCP if remains concerns by no overt signs of clot noted (no history, no tenderness, no redness or swelling). Pt reports after 2nd fall her balance was very bad. Wasn't until this week that she is walking some better. Does furniture walk. Has history of tinnitus but got significantly worse since fall in left ear. She also reports vertigo since the fall. Has had vertigo in the past but not the same as that. No room spinning like prior but she does get some [place] and feels woozy. Feels if she turns that she is still moving. If closes eyes she does get the twirling sensation. She has been getting more headaches that are lower level but does change. She is currently wearing a heart monitor for 2 weeks. Anxiety has been worse since the falls.    Pertinent History PMH: abnormal thyroid function, ADHD, anxiety, arthritis, chronic kidney disease, DJD, fibromyalgia, tinnitus left ear    Diagnostic tests MRI cervical 12/13/20:     IMPRESSION: This MRI of the cervical spine with and without contrast shows the following:  1.  Multilevel degenerative changes as detailed above that do not lead to spinal stenosis or nerve root compression.  2.  3 mm retrolisthesis of C5 upon C6 and 1 mm anterolisthesis of C6 upon C7.  3.  The spinal cord appears normal  4.   Normal enhancement pattern.        MRI brain 12/13/20:        IMPRESSION: This MRI of the  brain with and without contrast shows the following:  1.   Mild generalized cortical atrophy, typical for age.  2.   Few punctate T2/FLAIR hyperintense foci in the hemispheres consistent with negligible chronic microvascular ischemic change.  3.   No acute findings.  4.   Normal enhancement pattern.    Patient Stated Goals Pt wants to move like she is 20 years younger feeling more confident moving around.    Currently in Pain? Yes    Pain Score 3     Pain Location Neck    Pain Orientation Right    Pain Descriptors / Indicators Sore    Pain Type Acute pain    Pain Onset More than a month ago    Pain Frequency Constant   tension   Aggravating  Factors  bending              [facility] PT Assessment - 12/23/20 1131      Assessment   Medical Diagnosis concussion/TBI/vertigo    Referring Provider (PT) Asencion Partridge Dohmeier    Onset Date/Surgical Date 11/06/20      Precautions   Precautions Fall      Balance Screen   Has the patient fallen in the past 6 months Yes    How many times? 2    Has the patient had a decrease in activity level because of a fear of falling?  Yes    Is the patient reluctant to leave their home because of a fear of falling?  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 If more anxious everything  is worse.     Sensation   Light Touch Appears Intact      ROM / Strength   AROM / PROM / Strength Strength      Strength   Overall Strength Comments WFL but did have some pain with resisted left shoulder flexion and elbow flex/ext. Slightly weaker left hip flexion compared to right      Transfers   Transfers Sit to Stand;Stand to Sit    Sit to Stand 5: Supervision    Five time sit to stand comments  20 sec without hands. 5/10 dizziness, HR=56    Stand to Sit 5: Supervision      Ambulation/Gait   Ambulation/Gait Yes    Ambulation/Gait Assistance 5: Supervision    Ambulation/Gait Assistance Details Pt reports dizziness/unsteady with any turns.    Ambulation Distance (Feet) 75 Feet    Assistive device None    Gait Pattern Step-through pattern;Decreased step length - right;Decreased step length - left;Decreased arm swing - right;Decreased arm swing - left;Decreased trunk rotation    Ambulation Surface Level;Indoor    Gait velocity 29.61 sec=0.74m/s                  Vestibular Assessment - 12/23/20 0001      Oculomotor Exam   Smooth Pursuits Intact    Saccades Dysmetria;Overshoots   most impaired to left   Comment peripheral vision intact              Objective measurements completed on examination: See above findings.               PT Education - 12/23/20 1340    Education Details Discussed PT plan of care with vestibular evaluation next visit. Pt was instructed to change positions slowly and try to find a target to focus on when turning to fix her eyes. Educated  pt that symptoms she is experiencing are common after concussion.    Person(s) Educated Patient    Methods Explanation    Comprehension Verbalized understanding            PT Short Term Goals - 12/23/20 2057      PT SHORT TERM GOAL #1   Title Full vestibular assessment will be performed.    Baseline unable to fully assess at eval.    Time 4    Period Weeks    Status New    Target  Date 01/22/21      PT SHORT TERM GOAL #2   Title Pt will be independent in initial HEP for balance and vestibular exercises.    Time 4    Period Weeks    Status New    Target Date 01/22/21      PT SHORT TERM GOAL #3   Title Pt will report <3/10 pain in neck/head with activities for improved function.    Baseline 3/10 currently but increases with activities    Time 4    Period Weeks    Status New    Target Date 01/22/21      PT SHORT TERM GOAL #4   Title DGI will be performed and LTG written.    Time 4    Period Weeks    Status New    Target Date 01/22/21      PT SHORT TERM GOAL #5   Title Pt will decrease 5 x sit to stand from 20 sec to <16 sec for improved balance.    Baseline 12/23/20 20 sec without hands from chair with 5/10 dizziness    Time 4    Period Weeks    Status New    Target Date 01/22/21             PT Long Term Goals - 12/23/20 2101      PT LONG TERM GOAL #1   Title Pt will be independent with progressive HEP for balance and vestibular exercises to continue gains on own.    Time 8    Period Weeks    Status New    Target Date 02/21/21      PT LONG TERM GOAL #2   Title Pt will increase gait speed to >0.87m/s for improved gait safety.    Baseline 12/23/20 0.29m/s    Time 8    Period Weeks    Status New    Target Date 02/21/21      PT LONG TERM GOAL #3   Title Pt will be able to perform functional activities with <3/10 dizziness for improved function.    Baseline currently varies but increases significantly with turning at all. 5/10 dizziness with 5 x sit to stand.    Time 8    Period Weeks    Status New    Target Date 02/21/21      PT LONG TERM GOAL #4   Title Pt will ambulate >500' on varied surfaces independently for improved community mobility.    Time 8    Period Weeks    Status New    Target Date 02/21/21      PT LONG TERM GOAL #5   Title DGI goal TBD.    Time 8    Period Weeks    Status New    Target Date 02/21/21                   Plan - 12/23/20 1342  Clinical Impression Statement Pt is 74 y/o female who presents after 2 unexplained falls backwards hitting head both times with diagnoses of concussion, TBI and vertigo. Pt has history of anxiety which has been worse since falls. Pt presents with impaired gait with slow gait speed of 0.55m/s indicating poor gait safety for household ambulator. She is very fearful of falling. High fall risk based on 5 x sit to stand of 20 sec with 5/10 dizziness reported after the task. Pt with impaired saccades to the left and reports feeling very unsteady with any turning. Further vestibular assessement will be needed at next visit as focused on answering pt's questions due to high anxiety levels. Pt will benefit from skilled PT to address balance and vestibular deficits as well as neck pain/headaches.    Personal Factors and Comorbidities Comorbidity 3+    Comorbidities PMH: abnormal thyroid function, ADHD, anxiety, arthritis, chronic kidney disease, DJD, fibromyalgia, tinnitus left ear    Examination-Activity Limitations Locomotion Level;Transfers;Stairs;Stand;Dressing;Bathing    Examination-Participation Restrictions Community Activity;Yard Work;Driving    Stability/Clinical Decision Making Evolving/Moderate complexity    Clinical Decision Making Moderate    Rehab Potential Good    PT Frequency 2x / week   plus eval   PT Duration 8 weeks    PT Treatment/Interventions ADLs/Self Care Home Management;Cryotherapy;Moist Heat;DME Instruction;Therapeutic activities;Functional mobility training;Stair training;Gait training;Therapeutic exercise;Balance training;Neuromuscular re-education;Canalith Repostioning;Patient/family education;Vestibular;Passive range of motion;Manual techniques;Spinal Manipulations    PT Next Visit Plan Next visit perform full vestibular assessment and assess cervical spine. Saccades impaired to left. Unsteady feeling with position changes and turning.   Assess DGI when able and add LTG. How is pt's anxiety doing? She was very concerned with possibility of blood clots in legs at my visit but did not have any signs of them.    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

## 2020-12-26 ENCOUNTER — Other Ambulatory Visit: Payer: Self-pay | Admitting: Neurology

## 2020-12-28 ENCOUNTER — Encounter: Payer: Self-pay | Admitting: Physical Therapy

## 2020-12-28 ENCOUNTER — Ambulatory Visit: Payer: PPO | Attending: Neurology | Admitting: Physical Therapy

## 2020-12-28 ENCOUNTER — Other Ambulatory Visit: Payer: Self-pay

## 2020-12-28 DIAGNOSIS — R2681 Unsteadiness on feet: Secondary | ICD-10-CM

## 2020-12-28 DIAGNOSIS — R2689 Other abnormalities of gait and mobility: Secondary | ICD-10-CM | POA: Diagnosis not present

## 2020-12-28 DIAGNOSIS — M542 Cervicalgia: Secondary | ICD-10-CM | POA: Diagnosis not present

## 2020-12-28 DIAGNOSIS — R42 Dizziness and giddiness: Secondary | ICD-10-CM | POA: Diagnosis not present

## 2020-12-28 NOTE — Therapy (Signed)
[facility] [address] [place], [place], [address] Phone: [phone]   Fax:  [phone]  Physical Therapy Treatment  Patient Details  Name: Wendy Joyce MRN: [id number] Date of Birth: May 19, 1946 Referring Provider (PT): Asencion Partridge Dohmeier   Encounter Date: 12/28/2020   PT End of Session - 12/28/20 1714    Visit Number 2    Number of Visits 17    Date for PT Re-Evaluation 03/23/21   60 day poc, 90 day cert   Authorization Type H[employer] medicare so 10th visit progress note    PT Start Time 1400    PT Stop Time 1448    PT Time Calculation (min) 48 min    Activity Tolerance Patient tolerated treatment well    Behavior During Therapy Anxious           Past Medical History:  Diagnosis Date  . Abdominal pain, generalized 01/29/2015  . Abnormal thyroid function test 02/25/2016  . ADHD (attention deficit hyperactivity disorder)   . Allergic rhinitis, unspecified 01/29/2015  . Ankle sprain 03/16/2016  . Anxiety 01/29/2015  . Arthritis    hands  . Chronic kidney disease 2012   kidney stones  . Constipation 01/29/2015  . Decreased libido 05/02/2018  . Disorder of the skin and subcutaneous tissue, unspecified 01/29/2015  . DJD (degenerative joint disease)    hand  . Dysthymia   . Endometriosis   . External otitis   . Family history of coronary artery disease 05/02/2018  . Fatigue due to excessive exertion 02/24/2016  . Fever blister   . Fibromyalgia 01/29/2015  . Frequency of micturition 01/29/2015  . Functional dyspepsia 01/29/2015  . Gastro-esophageal reflux disease without esophagitis 01/29/2015  . Headache(784.0)   . High cholesterol   . Hyperlipidemia 09/12/2016  . Hypersomnia   . Hypothyroidism   . IBS (irritable bowel syndrome) 05/02/2018  . Insomnia   . Internal hemorrhoids   . 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 Labial pain 11/30/2016  . Lactose intolerance 12/14/2016  .  Lactose intolerance in adult 09/12/2016  . 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 Left ureteral stone   . 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  . Major depressive disorder, single episode, unspecified   . Migraine with aura, not intractable, without status migrainosus   . Mixed hyperlipidemia   . Mouth pain 01/07/2016  . Osteoarthritis of hip   . Osteopenia determined by x-ray 03/02/2017  . Personal history of estrogen therapy   . Phlebitis and thrombophlebitis of the leg   . Pityriasis alba 01/29/2015  . Pure hypercholesterolemia 06/29/2016  . Rectum pain 09/12/2016  . Recurrent sinus infections 02/24/2016  . Rhinosinusitis 01/07/2016  . Rosacea   . Sinusitis 12/24/2015  . 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 Umbilical hernia without obstruction and without gangrene 01/29/2015  . Umbilical pain [id number]  . Unspecified urinary incontinence 01/07/2016  . Urticaria   . UTI (urinary tract infection)   . 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 Vitamin D deficiency     Past Surgical History:  Procedure Laterality Date  . ABDOMINAL HYSTERECTOMY     partial  . BREAST ENHANCEMENT SURGERY    . BREAST REDUCTION SURGERY Bilateral 2018  . COLONOSCOPY WITH PROPOFOL N/A 02/25/2013   Procedure: COLONOSCOPY WITH PROPOFOL;  Surgeon: Garlan Fair, MD;  Location: [facility];  Service: Endoscopy;  Laterality: N/A;  . mini facelift  2000  . NASAL SINUS SURGERY    .  ROTATOR CUFF REPAIR     Right  . ROTATOR CUFF REPAIR     Left  . SEPTOPLASTY WITH ETHMOIDECTOMY, AND MAXILLARY ANTROSTOMY Bilateral 2006  . TOTAL ABDOMINAL HYSTERECTOMY    . TUBAL LIGATION    . umbicial hernia      There were no vitals filed for this visit.   Subjective Assessment - 12/28/20 1405    Subjective Nothing new since last week, no falls.  Has been using a massager and did a little bit of traction (laying legs off the bed) and that seemed to help, legs didn't wake her up  last night.  Feeling nauseous and continues to have a constant headache.    Pertinent History PMH: abnormal thyroid function, ADHD, anxiety, arthritis, chronic kidney disease, DJD, fibromyalgia, tinnitus left ear    Diagnostic tests MRI cervical 12/13/20:     IMPRESSION: This MRI of the cervical spine with and without contrast shows the following:  1.  Multilevel degenerative changes as detailed above that do not lead to spinal stenosis or nerve root compression.  2.  3 mm retrolisthesis of C5 upon C6 and 1 mm anterolisthesis of C6 upon C7.  3.  The spinal cord appears normal  4.   Normal enhancement pattern.        MRI brain 12/13/20:        IMPRESSION: This MRI of the brain with and without contrast shows the following:  1.   Mild generalized cortical atrophy, typical for age.  2.   Few punctate T2/FLAIR hyperintense foci in the hemispheres consistent with negligible chronic microvascular ischemic change.  3.   No acute findings.  4.   Normal enhancement pattern.    Patient Stated Goals Pt wants to move like she is 20 years younger feeling more confident moving around.    Currently in Pain? Yes    Pain Score 3     Pain Location Neck    Pain Type Acute pain    Pain Onset More than a month ago              [facility] PT Assessment - 12/28/20 1443      ROM / Strength   AROM / PROM / Strength AROM      AROM   Overall AROM  Deficits    AROM Assessment Site Cervical    Cervical Flexion 50    Cervical Extension 45    Cervical - Right Side Bend 25    Cervical - Left Side Bend 27    Cervical - Right Rotation 55    Cervical - Left Rotation 57               Vestibular Assessment - 12/28/20 1408      Vestibular Assessment   General Observation Stands slowly, veering to the right with gait.   Since fall pt has experieced diplopia and blurring of peripheral vision, has improved.  Reports tinnitus in L ear is worse, feels like she is having a harder time hearing the TV.  Reports feeling  nauseous but no vomiting.      Symptom Behavior   Type of Dizziness  Unsteady with head/body turns;Imbalance    Frequency of Dizziness Daily    Duration of Dizziness seconds    Symptom Nature Motion provoked    Aggravating Factors Turning body quickly;Turning head quickly;Sit to stand    Relieving Factors Slow movements;Head stationary;Rest;Dark room    Progression of Symptoms Better    History of similar  episodes has history of spinning vertigo      Oculomotor Exam   Oculomotor Alignment Normal    Ocular ROM WFL    Spontaneous Absent    Gaze-induced  Absent    Smooth Pursuits Intact    Saccades Intact;Slow    Comment peripheral vision intact      Oculomotor Exam-Fixation Suppressed    Left Head Impulse positive    Right Head Impulse negative      Vestibulo-Ocular Reflex   VOR to Slow Head Movement Comment   dizziness   VOR Cancellation Normal   mild dizziness     Other Tests   Comments Cervicogenic assessment: head stationary - body rotating: dizziness with rotation to left and right      Positional Testing   Dix-Hallpike Dix-Hallpike Right;Dix-Hallpike Left    Horizontal Canal Testing Horizontal Canal Right;Horizontal Canal Left      Dix-Hallpike Right   Dix-Hallpike Right Duration 0    Dix-Hallpike Right Symptoms No nystagmus      Dix-Hallpike Left   Dix-Hallpike Left Duration 0    Dix-Hallpike Left Symptoms No nystagmus      Horizontal Canal Right   Horizontal Canal Right Duration 0    Horizontal Canal Right Symptoms Normal      Horizontal Canal Left   Horizontal Canal Left Duration 0    Horizontal Canal Left Symptoms Normal      Positional Sensitivities   Sit to Supine Mild dizziness    Supine to Left Side No dizziness    Supine to Right Side No dizziness    Supine to Sitting Mild dizziness    Head Turning x 5 Moderate dizziness    Rolling Right No dizziness    Rolling Left No dizziness                            PT Education -  12/28/20 1714    Education Details vestibular findings, focus of vestibular treatment    Person(s) Educated Patient    Methods Explanation    Comprehension Verbalized understanding            PT Short Term Goals - 12/23/20 2057      PT SHORT TERM GOAL #1   Title Full vestibular assessment will be performed.    Baseline unable to fully assess at eval.    Time 4    Period Weeks    Status New    Target Date 01/22/21      PT SHORT TERM GOAL #2   Title Pt will be independent in initial HEP for balance and vestibular exercises.    Time 4    Period Weeks    Status New    Target Date 01/22/21      PT SHORT TERM GOAL #3   Title Pt will report <3/10 pain in neck/head with activities for improved function.    Baseline 3/10 currently but increases with activities    Time 4    Period Weeks    Status New    Target Date 01/22/21      PT SHORT TERM GOAL #4   Title DGI will be performed and LTG written.    Time 4    Period Weeks    Status New    Target Date 01/22/21      PT SHORT TERM GOAL #5   Title Pt will decrease 5 x sit to stand from 20 sec to <16 sec for  improved balance.    Baseline 12/23/20 20 sec without hands from chair with 5/10 dizziness    Time 4    Period Weeks    Status New    Target Date 01/22/21             PT Long Term Goals - 12/28/20 1721      PT LONG TERM GOAL #1   Title Pt will be independent with progressive HEP for balance and vestibular exercises to continue gains on own.    Time 8    Period Weeks    Status New    Target Date 02/21/21      PT LONG TERM GOAL #2   Title Pt will increase gait speed to >0.71m/s for improved gait safety.    Baseline 12/23/20 0.34m/s    Time 8    Period Weeks    Status New    Target Date 02/21/21      PT LONG TERM GOAL #3   Title Pt will be able to perform functional activities with <3/10 dizziness for improved function.    Baseline currently varies but increases significantly with turning at all. 5/10  dizziness with 5 x sit to stand.    Time 8    Period Weeks    Status New    Target Date 02/21/21      PT LONG TERM GOAL #4   Title Pt will ambulate >500' on varied surfaces independently for improved community mobility.    Time 8    Period Weeks    Status New    Target Date 02/21/21      PT LONG TERM GOAL #5   Title DGI goal TBD.    Time 8    Period Weeks    Status New      PT LONG TERM GOAL #6   Title Pt will demonstrate 10 deg increase in pain free cervical ROM to improve cervical proprioception and vestibular function.    Baseline see flow sheets    Time 8    Period Weeks    Target Date 02/21/21                 Plan - 12/28/20 1715    Clinical Impression Statement Continued with visual, vestibular and cervical assessments.  Pt presents with limited cervical ROM, cervicogenic dizziness, visual motion sensitivity and left vestibular hypofunction.  Pt was negative for positional vertigo.  Will initiate vestibular HEP next session.    Personal Factors and Comorbidities Comorbidity 3+    Comorbidities PMH: abnormal thyroid function, ADHD, anxiety, arthritis, chronic kidney disease, DJD, fibromyalgia, tinnitus left ear    Examination-Activity Limitations Locomotion Level;Transfers;Stairs;Stand;Dressing;Bathing    Examination-Participation Restrictions Community Activity;Yard Work;Driving    Stability/Clinical Decision Making Evolving/Moderate complexity    Rehab Potential Good    PT Frequency 2x / week   plus eval   PT Duration 8 weeks    PT Treatment/Interventions ADLs/Self Care Home Management;Cryotherapy;Moist Heat;DME Instruction;Therapeutic activities;Functional mobility training;Stair training;Gait training;Therapeutic exercise;Balance training;Neuromuscular re-education;Canalith Repostioning;Patient/family education;Vestibular;Passive range of motion;Manual techniques;Spinal Manipulations    PT Next Visit Plan Initiate HEP: focus on neck  ROM/strengthening/proprioception; vestibular HEP: x1 viewing in sitting to begin, habituation to motion sensitivity, standing balance with head/body turns.  Assess DGI when able and add LTG.    Consulted and Agree with Plan of Care Patient           Patient will benefit from skilled therapeutic intervention in order to improve the following deficits and impairments:  Abnormal gait,Decreased balance,Decreased mobility,Decreased strength,Decreased range of motion,Pain  Visit Diagnosis: Other abnormalities of gait and mobility  Dizziness and giddiness  Unsteadiness on feet     Problem List Patient Active Problem List   Diagnosis Date Noted  . 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 [place], [place], [address] Phone: [phone]   Fax:  [phone]  Name: Wendy Joyce MRN: [id number] Date of Birth: 25-Feb-1946

## 2020-12-31 ENCOUNTER — Encounter: Payer: Self-pay | Admitting: Internal Medicine

## 2020-12-31 ENCOUNTER — Ambulatory Visit: Payer: PPO

## 2020-12-31 ENCOUNTER — Ambulatory Visit (INDEPENDENT_AMBULATORY_CARE_PROVIDER_SITE_OTHER): Payer: PPO | Admitting: Internal Medicine

## 2020-12-31 ENCOUNTER — Other Ambulatory Visit: Payer: Self-pay

## 2020-12-31 VITALS — BP 120/72 | HR 60 | Ht 63.0 in | Wt 133.2 lb

## 2020-12-31 DIAGNOSIS — R2681 Unsteadiness on feet: Secondary | ICD-10-CM

## 2020-12-31 DIAGNOSIS — R42 Dizziness and giddiness: Secondary | ICD-10-CM

## 2020-12-31 DIAGNOSIS — R2689 Other abnormalities of gait and mobility: Secondary | ICD-10-CM

## 2020-12-31 DIAGNOSIS — E039 Hypothyroidism, unspecified: Secondary | ICD-10-CM

## 2020-12-31 LAB — T4, FREE: Free T4: 1.24 ng/dL (ref 0.60–1.60)

## 2020-12-31 LAB — TSH: TSH: 0.17 u[IU]/mL — ABNORMAL LOW (ref 0.35–4.50)

## 2020-12-31 NOTE — Progress Notes (Signed)
Name: Ariday Weichman Nitta  MRN/ DOB: [id number], August 05, 1946    Age/ Sex: 75 y.o., female    PCP: Lewis Moccasin, MD   Reason for Endocrinology Evaluation: Hypothyroidism     Date of Initial Endocrinology Evaluation: 12/31/2020     HPI: Ms. Saryiah Bartholf is a 75 y.o. female with a past medical history of Migraine headaches, IBS and dyslipidemia. The patient presented for initial endocrinology clinic visit on 12/31/2020 for consultative assistance with her Hypothyroidism.   She has been diagnosed with hypothyroidism many years ago. Has been on LT-4 replacement  since her diagnosis .  She is c/o fatigue and thinning of the nails  She feels better when her dose is up   Has diarrhea  Worsening for the past week that she attributes to Linzess Has extreme anxiety  Has noted some hand tremors  Has sleep issues and has not been sleeping so much    Had recent concussion, had 2 falls for no known reason   Sisters with thyroid disease  HOME ENDOCRINE MEDICATIONS: Synthroid 100 mcg daily    HISTORY:  Past Medical History:  Past Medical History:  Diagnosis Date  . Abdominal pain, generalized 01/29/2015  . Abnormal thyroid function test 02/25/2016  . ADHD (attention deficit hyperactivity disorder)   . Allergic rhinitis, unspecified 01/29/2015  . Ankle sprain 03/16/2016  . Anxiety 01/29/2015  . Arthritis    hands  . Chronic kidney disease 2012   kidney stones  . Constipation 01/29/2015  . Decreased libido 05/02/2018  . Disorder of the skin and subcutaneous tissue, unspecified 01/29/2015  . DJD (degenerative joint disease)    hand  . Dysthymia   . Endometriosis   . External otitis   . Family history of coronary artery disease 05/02/2018  . Fatigue due to excessive exertion 02/24/2016  . Fever blister   . Fibromyalgia 01/29/2015  . Frequency of micturition 01/29/2015  . Functional dyspepsia 01/29/2015  . Gastro-esophageal reflux disease without esophagitis  01/29/2015  . Headache(784.0)   . High cholesterol   . Hyperlipidemia 09/12/2016  . Hypersomnia   . Hypothyroidism   . IBS (irritable bowel syndrome) 05/02/2018  . Insomnia   . Internal hemorrhoids   . 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 Labial pain 11/30/2016  . Lactose intolerance 12/14/2016  . Lactose intolerance in adult 09/12/2016  . 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 Left ureteral stone   . 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 Major depressive disorder, single episode, unspecified   . Migraine with aura, not intractable, without status migrainosus   . Mixed hyperlipidemia   . Mouth pain 01/07/2016  . Osteoarthritis of hip   . Osteopenia determined by x-ray 03/02/2017  . Personal history of estrogen therapy   . Phlebitis and thrombophlebitis of the leg   . Pityriasis alba 01/29/2015  . Pure hypercholesterolemia 06/29/2016  . Rectum pain 09/12/2016  . Recurrent sinus infections 02/24/2016  . Rhinosinusitis 01/07/2016  . Rosacea   . Sinusitis 12/24/2015  . 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 Umbilical hernia without obstruction and without gangrene 01/29/2015  . Umbilical pain 05/02/2018  . Unspecified urinary incontinence 01/07/2016  . Urticaria   . UTI (urinary tract infection)   . 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 Vitamin D deficiency    Past Surgical History:  Past Surgical History:  Procedure Laterality Date  . ABDOMINAL HYSTERECTOMY     partial  . BREAST ENHANCEMENT  SURGERY    . BREAST REDUCTION SURGERY Bilateral 2018  . COLONOSCOPY WITH PROPOFOL N/A 02/25/2013   Procedure: COLONOSCOPY WITH PROPOFOL;  Surgeon: Garlan Fair, MD;  Location: [facility];  Service: Endoscopy;  Laterality: N/A;  . mini facelift  2000  . NASAL SINUS SURGERY    . ROTATOR CUFF REPAIR     Right  . ROTATOR CUFF REPAIR     Left  . SEPTOPLASTY WITH ETHMOIDECTOMY, AND MAXILLARY ANTROSTOMY Bilateral 2006  .  TOTAL ABDOMINAL HYSTERECTOMY    . TUBAL LIGATION    . umbicial hernia        Social History:  reports that she has never smoked. She has never used smokeless tobacco. She reports current alcohol use of about 1.0 standard drink of alcohol per week. She reports that she does not use drugs.  Family History: family history is not on file.   HOME MEDICATIONS: Allergies as of 12/31/2020      Reactions   Adhesive [tape] Dermatitis   Citrus Nausea And Vomiting   Escitalopram Oxalate Hives   Niacin And Related    Rash and breathing breathing problems   Amoxicillin Rash   Unsure of reaction/ was instructed not to take drug. Unknown reaction      Medication List       Accurate as of December 31, 2020 10:42 AM. If you have any questions, ask your nurse or doctor.        acyclovir ointment 5 % Commonly known as: ZOVIRAX Apply 1 application topically daily as needed (for fever blister).   albuterol 108 (90 Base) MCG/ACT inhaler Commonly known as: VENTOLIN HFA Inhale 2 puffs into the lungs every 6 (six) hours as needed for wheezing or shortness of breath.   ALPRAZolam 0.5 MG tablet Commonly known as: XANAX Take 1 tablet (0.5 mg total) by mouth at bedtime as needed (vertigo prn).   BI-EST PROGEST-TESTOSTERONE TD Place onto the skin as directed.   cetirizine 10 MG tablet Commonly known as: ZYRTEC Take 10 mg by mouth daily.   COQ10 PO Take 100 mg by mouth daily.   diclofenac sodium 1 % Gel Commonly known as: VOLTAREN Apply 2 g topically 3 (three) times daily as needed.   donepezil 5 MG tablet Commonly known as: ARICEPT Take 1 tablet (5 mg total) by mouth 2 (two) times daily.   estradiol 0.5 MG tablet Commonly known as: ESTRACE Take 0.5 mg by mouth daily. Taking half/day so taking 0.25mg    FISH OIL PO Take 1,400 mg by mouth daily.   GUAIFENESIN-PSEUDOEPHEDRINE PO Take by mouth.   levothyroxine 100 MCG tablet Commonly known as: SYNTHROID Take 100 mcg by mouth daily  before breakfast.   Lidocaine (Anorectal) 5 % Crea Apply topically in the morning, at noon, in the evening, and at bedtime.   Linzess 145 MCG Caps capsule Generic drug: linaclotide Take 145 mcg by mouth daily.   MAGNESIUM CITRATE PO Take 150 mg by mouth daily.   multivitamin with minerals Tabs tablet Take 1 tablet by mouth daily.   polyethylene glycol 17 g packet Commonly known as: MIRALAX / GLYCOLAX Take 17 g by mouth daily.   Praluent 75 MG/ML Soaj Generic drug: Alirocumab Inject 75 mg into the skin every 14 (fourteen) days.   Tart Cherry 1200 MG Caps Take 1 capsule by mouth in the morning and at bedtime.   TGT PSYLLIUM FIBER PO Take 4 capsules by mouth daily.   Turmeric 500 MG Tabs Take 1,000 mg by  mouth in the morning and at bedtime.   valACYclovir 500 MG tablet Commonly known as: VALTREX Take 250 mg by mouth 2 (two) times daily as needed (Out breaks Fever blisters).   Vitamin D3 25 MCG (1000 UT) Caps Take 1 capsule by mouth daily.   zaleplon 10 MG capsule Commonly known as: SONATA Take 10 mg by mouth at bedtime as needed.         REVIEW OF SYSTEMS: A comprehensive ROS was conducted with the patient and is negative except as per HPI    OBJECTIVE:  VS: BP 120/72   Pulse 60   Ht 5\' 3"  (1.6 m)   Wt 133 lb 4 oz (60.4 kg)   SpO2 98%   BMI 23.60 kg/m    Wt Readings from Last 3 Encounters:  12/31/20 133 lb 4 oz (60.4 kg)  12/11/20 134 lb (60.8 kg)  12/09/20 132 lb (59.9 kg)     EXAM: General: Pt appears well and is in NAD  Neck: General: Supple without adenopathy. Thyroid: Thyroid size normal.  No goiter or nodules appreciated. No thyroid bruit.  Lungs: Clear with good BS bilat with no rales, rhonchi, or wheezes  Heart: Auscultation: RRR.  Abdomen: Normoactive bowel sounds, soft, nontender, without masses or organomegaly palpable  Extremities:  BL LE: No pretibial edema normal ROM and strength.  Skin: Hair: Texture and amount normal with  gender appropriate distribution Skin Inspection: No rashes Skin Palpation: Skin temperature, texture, and thickness normal to palpation  Neuro: Cranial nerves: II - XII grossly intact  Motor: Normal strength throughout DTRs: 2+ and symmetric in UE without delay in relaxation phase  Mental Status: Judgment, insight: Intact Orientation: Oriented to time, place, and person Mood and affect: No depression, anxiety, or agitation     DATA REVIEWED: Results for QAMAR, ROSMAN (MRN [name]) as of 01/01/2021 07:41  Ref. Range 12/31/2020 11:21  TSH Latest Ref Range: 0.35 - 4.50 uIU/mL 0.17 (L)  T4,Free(Direct) Latest Ref Range: 0.60 - 1.60 ng/dL 02/28/2021       9.92 TSH 0.08 uIU/mL  FT4 1.32 ng/dL  Vitamin D 95 ng/mL    ASSESSMENT/PLAN/RECOMMENDATIONS:   1. Hashimoto's thyroiditis:   - Pt is clinically HYPERthyroid  - Historically with suppressed TSH on LT-4 replacement, pt feels poorly with reduced doses - We discussed increased risk of bone resorption and cardiac arrhythmia which could subsequently lead to stoke and CHF in the setting of iatrogenic hyperthyroidism .  - TSH today continues to show low TSH, will reduce dose as below  - Has recurrent falls and was recently evaluated by cardiology with a cardiac monitor-results pending  - Pt is interested in kowing if she has Hashimoto's thyroiditis, Anti-TPO Ab's pending   Medications : Decrease Levothyroxine 100 mcg to 6 days a week ( Skip Sundays)    F/U in 3 months   Signed electronically by: 04-09-1974, MD  [facility] Endocrinology  [facility] [address] [place] [place], [place] Phone: [phone] FAX: [phone]   CC: [phone], MD [address] [place] Phone: [phone] Fax: [phone]   Return to Endocrinology clinic as below: Future Appointments  Date Time Provider Department Center  12/31/2020  3:30 PM 02/28/2021, PT  [facility]  01/05/2021  1:15 PM 03/05/2021, PT [facility]  01/06/2021  2:00 PM 03/06/2021, PT [facility]  01/11/2021  1:15 PM 01/13/2021, PT [facility]  01/14/2021  2:00 PM Jones Bales, PT [facility]  01/18/2021  2:00 PM Jones Bales, PT [facility]  01/21/2021  2:00 PM Jones Bales, PT [facility]  01/25/2021  2:00 PM Jones Bales, PT [facility]  01/28/2021  2:00 PM Jones Bales, PT [facility]  02/01/2021  2:00 PM Jones Bales, PT [facility]  02/04/2021  2:00 PM Jones Bales, PT [facility]  02/08/2021  2:00 PM Jones Bales, PT [facility]  02/11/2021  2:00 PM Jones Bales, PT [facility]  02/15/2021  2:00 PM Jones Bales, PT [facility]  02/18/2021  2:00 PM Jones Bales, PT [facility]  03/09/2021  1:30 PM Dohmeier, Asencion Partridge, MD [facility] None

## 2020-12-31 NOTE — Patient Instructions (Addendum)
Gaze Stabilization: Sitting    Keeping eyes on target on wall 3-4 feet away, tilt head down 15-30 and move head side to side for 30 seconds. Repeat while moving head up and down for 30 seconds. Do 2-3 sessions per day.  Copyright  VHI. All rights reserved.     Access Code: [email or web address] URL: [email or web address] Date: 12/31/2020 Prepared by: Jethro Bastos  Exercises Seated Assisted Cervical Rotation with Towel - 1 x daily - 5 x weekly - 1 sets - 3-4 reps - 10 hold Seated Head Turns Vestibular Habituation - 1 x daily - 5 x weekly - 3 sets - 5 reps

## 2020-12-31 NOTE — Patient Instructions (Signed)

## 2020-12-31 NOTE — Therapy (Signed)
[facility] [address] [place], [place], [address] Phone: [phone]   Fax:  [phone]  Physical Therapy Treatment  Patient Details  Name: Wendy Joyce MRN: [id number] Date of Birth: 1946-01-26 Referring Provider (PT): Porfirio Mylar Dohmeier   Encounter Date: 12/31/2020   PT End of Session - 12/31/20 1534    Visit Number 3    Number of Visits 17    Date for PT Re-Evaluation 03/23/21   60 day poc, 90 day cert   Authorization Type [employer] medicare so 10th visit progress note    PT Start Time 1532    PT Stop Time 1615    PT Time Calculation (min) 43 min    Activity Tolerance Patient tolerated treatment well    Behavior During Therapy Anxious           Past Medical History:  Diagnosis Date  . Abdominal pain, generalized 01/29/2015  . Abnormal thyroid function test 02/25/2016  . ADHD (attention deficit hyperactivity disorder)   . Allergic rhinitis, unspecified 01/29/2015  . Ankle sprain 03/16/2016  . Anxiety 01/29/2015  . Arthritis    hands  . Chronic kidney disease 2012   kidney stones  . Constipation 01/29/2015  . Decreased libido 05/02/2018  . Disorder of the skin and subcutaneous tissue, unspecified 01/29/2015  . DJD (degenerative joint disease)    hand  . Dysthymia   . Endometriosis   . External otitis   . Family history of coronary artery disease 05/02/2018  . Fatigue due to excessive exertion 02/24/2016  . Fever blister   . Fibromyalgia 01/29/2015  . Frequency of micturition 01/29/2015  . Functional dyspepsia 01/29/2015  . Gastro-esophageal reflux disease without esophagitis 01/29/2015  . Headache(784.0)   . High cholesterol   . Hyperlipidemia 09/12/2016  . Hypersomnia   . Hypothyroidism   . IBS (irritable bowel syndrome) 05/02/2018  . Insomnia   . Internal hemorrhoids   . 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 Labial pain 11/30/2016  . Lactose intolerance 12/14/2016  .  Lactose intolerance in adult 09/12/2016  . 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 Left ureteral stone   . 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 Major depressive disorder, single episode, unspecified   . Migraine with aura, not intractable, without status migrainosus   . Mixed hyperlipidemia   . Mouth pain 01/07/2016  . Osteoarthritis of hip   . Osteopenia determined by x-ray 03/02/2017  . Personal history of estrogen therapy   . Phlebitis and thrombophlebitis of the leg   . Pityriasis alba 01/29/2015  . Pure hypercholesterolemia 06/29/2016  . Rectum pain 09/12/2016  . Recurrent sinus infections 02/24/2016  . Rhinosinusitis 01/07/2016  . Rosacea   . Sinusitis 12/24/2015  . 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 Umbilical hernia without obstruction and without gangrene 01/29/2015  . Umbilical pain 05/02/2018  . Unspecified urinary incontinence 01/07/2016  . Urticaria   . UTI (urinary tract infection)   . 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 Vitamin D deficiency     Past Surgical History:  Procedure Laterality Date  . ABDOMINAL HYSTERECTOMY     partial  . BREAST ENHANCEMENT SURGERY    . BREAST REDUCTION SURGERY Bilateral 2018  . COLONOSCOPY WITH PROPOFOL N/A 02/25/2013   Procedure: COLONOSCOPY WITH PROPOFOL;  Surgeon: Charolett Bumpers, MD;  Location: [facility];  Service: Endoscopy;  Laterality: N/A;  . mini facelift  2000  . NASAL SINUS SURGERY    .  ROTATOR CUFF REPAIR     Right  . ROTATOR CUFF REPAIR     Left  . SEPTOPLASTY WITH ETHMOIDECTOMY, AND MAXILLARY ANTROSTOMY Bilateral 2006  . TOTAL ABDOMINAL HYSTERECTOMY    . TUBAL LIGATION    . umbicial hernia      There were no vitals filed for this visit.   Subjective Assessment - 12/31/20 1535    Subjective Patient reports no new changes complaints since last visit. Reports that the dizziness is better, but is still having the nauseous sensation. Currently has a HA/Migraine  currently.    Pertinent History PMH: abnormal thyroid function, ADHD, anxiety, arthritis, chronic kidney disease, DJD, fibromyalgia, tinnitus left ear    Diagnostic tests MRI cervical 12/13/20:     IMPRESSION: This MRI of the cervical spine with and without contrast shows the following:  1.  Multilevel degenerative changes as detailed above that do not lead to spinal stenosis or nerve root compression.  2.  3 mm retrolisthesis of C5 upon C6 and 1 mm anterolisthesis of C6 upon C7.  3.  The spinal cord appears normal  4.   Normal enhancement pattern.        MRI brain 12/13/20:        IMPRESSION: This MRI of the brain with and without contrast shows the following:  1.   Mild generalized cortical atrophy, typical for age.  2.   Few punctate T2/FLAIR hyperintense foci in the hemispheres consistent with negligible chronic microvascular ischemic change.  3.   No acute findings.  4.   Normal enhancement pattern.    Patient Stated Goals Pt wants to move like she is 20 years younger feeling more confident moving around.    Currently in Pain? Yes    Pain Score 6     Pain Location Head    Pain Orientation Anterior   behind the eyes   Pain Descriptors / Indicators Headache    Pain Type Acute pain    Pain Onset More than a month ago               [facility] Adult PT Treatment/Exercise - 12/31/20 0001      Ambulation/Gait   Ambulation/Gait Yes    Ambulation/Gait Assistance 5: Supervision    Ambulation/Gait Assistance Details patient ambulating into therapy session, very slow cautious gait noted. PT providing close supervision. pateitn intermittent using wall for support.    Assistive device None    Gait Pattern Step-through pattern;Decreased step length - right;Decreased step length - left;Decreased arm swing - right;Decreased arm swing - left;Decreased trunk rotation    Ambulation Surface Level;Indoor      Self-Care   Self-Care Other Self-Care Comments    Other Self-Care Comments  PT educating on  relaxation techniques to promote reduced stress/tension, patient gets enjoyment via music. Educated to attempt laying dark listening to music to promote improved relaxation. PT also educating to try massager on upper cerivcal/shoulder region to promote improved muscle tension. patient verbalizing agreement.      Exercises   Exercises Other Exercises    Other Exercises  Completed seated cervical AAROM with towel, completed x 5 reps to each direction with 10-15 seconds hold, patient promoting improved relaxation post completion. Attempted levator scap stretch 1 x 30 seconds bilaterally, and lateral sidebending stretch 1 x 30 secs bilaterally. Both of these increased pain and umcomfortable per patient reports. held off on these additions to HEP>           Vestibular Treatment/Exercise - 12/31/20  0001      Vestibular Treatment/Exercise   Vestibular Treatment Provided Gaze;Habituation    Habituation Exercises Seated Horizontal Head Turns    Gaze Exercises X1 Viewing Horizontal;X1 Viewing Vertical      Seated Horizontal Head Turns   Number of Reps  5    Symptom Description  x 2 sets. short rest break required between completion. mild increase in symptoms      X1 Viewing Horizontal   Foot Position seated    Reps 2    Comments x 30 seconds, increase in symptoms require extended rest. increase in dizziness and nauseous      X1 Viewing Vertical   Foot Position seated    Reps 2    Comments x 30 seconds, no increase in symptoms             PT Education - 12/31/20 1635    Education Details initial HEP focused on neck ROM/vestibular    Person(s) Educated Patient    Methods Explanation;Demonstration;Handout    Comprehension Verbalized understanding;Returned demonstration;Verbal cues required            PT Short Term Goals - 12/23/20 2057      PT SHORT TERM GOAL #1   Title Full vestibular assessment will be performed.    Baseline unable to fully assess at eval.    Time 4    Period  Weeks    Status New    Target Date 01/22/21      PT SHORT TERM GOAL #2   Title Pt will be independent in initial HEP for balance and vestibular exercises.    Time 4    Period Weeks    Status New    Target Date 01/22/21      PT SHORT TERM GOAL #3   Title Pt will report <3/10 pain in neck/head with activities for improved function.    Baseline 3/10 currently but increases with activities    Time 4    Period Weeks    Status New    Target Date 01/22/21      PT SHORT TERM GOAL #4   Title DGI will be performed and LTG written.    Time 4    Period Weeks    Status New    Target Date 01/22/21      PT SHORT TERM GOAL #5   Title Pt will decrease 5 x sit to stand from 20 sec to <16 sec for improved balance.    Baseline 12/23/20 20 sec without hands from chair with 5/10 dizziness    Time 4    Period Weeks    Status New    Target Date 01/22/21             PT Long Term Goals - 12/28/20 1721      PT LONG TERM GOAL #1   Title Pt will be independent with progressive HEP for balance and vestibular exercises to continue gains on own.    Time 8    Period Weeks    Status New    Target Date 02/21/21      PT LONG TERM GOAL #2   Title Pt will increase gait speed to >0.39m/s for improved gait safety.    Baseline 12/23/20 0.30m/s    Time 8    Period Weeks    Status New    Target Date 02/21/21      PT LONG TERM GOAL #3   Title Pt will be able to perform functional activities  with <3/10 dizziness for improved function.    Baseline currently varies but increases significantly with turning at all. 5/10 dizziness with 5 x sit to stand.    Time 8    Period Weeks    Status New    Target Date 02/21/21      PT LONG TERM GOAL #4   Title Pt will ambulate >500' on varied surfaces independently for improved community mobility.    Time 8    Period Weeks    Status New    Target Date 02/21/21      PT LONG TERM GOAL #5   Title DGI goal TBD.    Time 8    Period Weeks    Status New       PT LONG TERM GOAL #6   Title Pt will demonstrate 10 deg increase in pain free cervical ROM to improve cervical proprioception and vestibular function.    Baseline see flow sheets    Time 8    Period Weeks    Target Date 02/21/21                 Plan - 12/31/20 1645    Clinical Impression Statement Due to migraine, patient treated in private treatment room to avoid noise and with lights dimmed. Completed therex focused on cervical ROM to patients tolerance. As well as beginning vestibular HEP, including VOR x 1 and habituation exercises to horizontal head turns. WIll continue to progress toward all LTGs.    Personal Factors and Comorbidities Comorbidity 3+    Comorbidities PMH: abnormal thyroid function, ADHD, anxiety, arthritis, chronic kidney disease, DJD, fibromyalgia, tinnitus left ear    Examination-Activity Limitations Locomotion Level;Transfers;Stairs;Stand;Dressing;Bathing    Examination-Participation Restrictions Community Activity;Yard Work;Driving    Stability/Clinical Decision Making Evolving/Moderate complexity    Rehab Potential Good    PT Frequency 2x / week   plus eval   PT Duration 8 weeks    PT Treatment/Interventions ADLs/Self Care Home Management;Cryotherapy;Moist Heat;DME Instruction;Therapeutic activities;Functional mobility training;Stair training;Gait training;Therapeutic exercise;Balance training;Neuromuscular re-education;Canalith Repostioning;Patient/family education;Vestibular;Passive range of motion;Manual techniques;Spinal Manipulations    PT Next Visit Plan how is exercises going? continue to focus on neck ROM/strengthening/proprioception; vestibular HEP: x1 viewing in sitting to begin, habituation to motion sensitivity, standing balance with head/body turns.  Assess DGI when able and add LTG.    Consulted and Agree with Plan of Care Patient           Patient will benefit from skilled therapeutic intervention in order to improve the following deficits  and impairments:  Abnormal gait,Decreased balance,Decreased mobility,Decreased strength,Decreased range of motion,Pain  Visit Diagnosis: Other abnormalities of gait and mobility  Dizziness and giddiness  Unsteadiness on feet     Problem List Patient Active Problem List   Diagnosis Date Noted  . 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

## 2021-01-01 ENCOUNTER — Telehealth: Payer: Self-pay | Admitting: Internal Medicine

## 2021-01-01 LAB — THYROID PEROXIDASE ANTIBODY: Thyroperoxidase Ab SerPl-aCnc: 75 IU/mL — ABNORMAL HIGH (ref <9)

## 2021-01-01 NOTE — Telephone Encounter (Signed)
Can you please make sure that the patient has received my portal message to her.  Her thyroid test continues to show that she is on too much levothyroxine and we need to decrease this dose as below    Take levothyroxine 100 MCG from Monday through Saturday, skip Sundays from now on  Also her antibody level came back elevated which confirms a diagnosis of Hashimoto's disease just like her family.  This does not change the course of treatment.    Thanks Abby Nena Jordan, MD  [facility] Endocrinology  [facility] [address] [place], [place] [address] Phone: [phone] FAX: [phone]

## 2021-01-01 NOTE — Telephone Encounter (Signed)
Spoken to patient and notified Dr Shamleffer's comments. Verbalized understanding.   

## 2021-01-05 ENCOUNTER — Ambulatory Visit: Payer: PPO | Admitting: Physical Therapy

## 2021-01-05 ENCOUNTER — Other Ambulatory Visit: Payer: Self-pay

## 2021-01-05 ENCOUNTER — Encounter: Payer: Self-pay | Admitting: Physical Therapy

## 2021-01-05 DIAGNOSIS — R2689 Other abnormalities of gait and mobility: Secondary | ICD-10-CM | POA: Diagnosis not present

## 2021-01-05 DIAGNOSIS — R42 Dizziness and giddiness: Secondary | ICD-10-CM

## 2021-01-05 DIAGNOSIS — R2681 Unsteadiness on feet: Secondary | ICD-10-CM

## 2021-01-05 NOTE — Therapy (Signed)
[facility] [address] [place], [place], [address] Phone: [phone]   Fax:  [phone]  Physical Therapy Treatment  Patient Details  Name: Wendy Joyce MRN: [id number] Date of Birth: [birth year] Referring Provider (PT): Asencion Partridge Dohmeier   Encounter Date: 01/05/2021   PT End of Session - 01/05/21 2019    Visit Number 4    Number of Visits 17    Date for PT Re-Evaluation 03/23/21   60 day poc, 90 day cert   Authorization Type HTA medicare so 10th visit progress note    PT Start Time 1318    PT Stop Time 1402    PT Time Calculation (min) 44 min    Activity Tolerance Patient tolerated treatment well    Behavior During Therapy [facility] for tasks assessed/performed           Past Medical History:  Diagnosis Date  . Abdominal pain, generalized 01/29/2015  . Abnormal thyroid function test 02/25/2016  . ADHD (attention deficit hyperactivity disorder)   . Allergic rhinitis, unspecified 01/29/2015  . Ankle sprain 03/16/2016  . Anxiety 01/29/2015  . Arthritis    hands  . Chronic kidney disease 2012   kidney stones  . Constipation 01/29/2015  . Decreased libido 05/02/2018  . Disorder of the skin and subcutaneous tissue, unspecified 01/29/2015  . DJD (degenerative joint disease)    hand  . Dysthymia   . Endometriosis   . External otitis   . Family history of coronary artery disease 05/02/2018  . Fatigue due to excessive exertion 02/24/2016  . Fever blister   . Fibromyalgia 01/29/2015  . Frequency of micturition 01/29/2015  . Functional dyspepsia 01/29/2015  . Gastro-esophageal reflux disease without esophagitis 01/29/2015  . Headache(784.0)   . High cholesterol   . Hyperlipidemia 09/12/2016  . Hypersomnia   . Hypothyroidism   . IBS (irritable bowel syndrome) 05/02/2018  . Insomnia   . Internal hemorrhoids   . 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 Labial pain 11/30/2016  . Lactose  intolerance 12/14/2016  . Lactose intolerance in adult 09/12/2016  . 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 Left ureteral stone   . LP (lichen planus) 69/62/9528  . Major depressive disorder, single episode, unspecified   . Migraine with aura, not intractable, without status migrainosus   . Mixed hyperlipidemia   . Mouth pain 01/07/2016  . Osteoarthritis of hip   . Osteopenia determined by x-ray 03/02/2017  . Personal history of estrogen therapy   . Phlebitis and thrombophlebitis of the leg   . Pityriasis alba 01/29/2015  . Pure hypercholesterolemia 06/29/2016  . Rectum pain 09/12/2016  . Recurrent sinus infections 02/24/2016  . Rhinosinusitis 01/07/2016  . Rosacea   . Sinusitis 12/24/2015  . 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 Umbilical hernia without obstruction and without gangrene 01/29/2015  . Umbilical pain 41/32/4401  . Unspecified urinary incontinence 01/07/2016  . Urticaria   . UTI (urinary tract infection)   . 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 Vitamin D deficiency     Past Surgical History:  Procedure Laterality Date  . ABDOMINAL HYSTERECTOMY     partial  . BREAST ENHANCEMENT SURGERY    . BREAST REDUCTION SURGERY Bilateral 2018  . COLONOSCOPY WITH PROPOFOL N/A 02/25/2013   Procedure: COLONOSCOPY WITH PROPOFOL;  Surgeon: Garlan Fair, MD;  Location: [facility];  Service: Endoscopy;  Laterality: N/A;  . mini facelift  2000  . NASAL SINUS  SURGERY    . ROTATOR CUFF REPAIR     Right  . ROTATOR CUFF REPAIR     Left  . SEPTOPLASTY WITH ETHMOIDECTOMY, AND MAXILLARY ANTROSTOMY Bilateral 2006  . TOTAL ABDOMINAL HYSTERECTOMY    . TUBAL LIGATION    . umbicial hernia      There were no vitals filed for this visit.   Subjective Assessment - 01/05/21 1324    Subjective Pt reports she currently has a headache - asks for a treatment room for this session, but one is not available -- states she will put sunglasses on to  help with the lights which make it worse for her.  Pt states she is doing the letter exercise in seated position for approx. 30 seconds.  Pt states she is doing the neck stretch with the towel    Pertinent History PMH: abnormal thyroid function, ADHD, anxiety, arthritis, chronic kidney disease, DJD, fibromyalgia, tinnitus left ear    Diagnostic tests MRI cervical 12/13/20:     IMPRESSION: This MRI of the cervical spine with and without contrast shows the following:  1.  Multilevel degenerative changes as detailed above that do not lead to spinal stenosis or nerve root compression.  2.  3 mm retrolisthesis of C5 upon C6 and 1 mm anterolisthesis of C6 upon C7.  3.  The spinal cord appears normal  4.   Normal enhancement pattern.        MRI brain 12/13/20:        IMPRESSION: This MRI of the brain with and without contrast shows the following:  1.   Mild generalized cortical atrophy, typical for age.  2.   Few punctate T2/FLAIR hyperintense foci in the hemispheres consistent with negligible chronic microvascular ischemic change.  3.   No acute findings.  4.   Normal enhancement pattern.    Patient Stated Goals Pt wants to move like she is 20 years younger feeling more confident moving around.    Currently in Pain? Yes    Pain Score 6     Pain Location Head    Pain Orientation Anterior    Pain Descriptors / Indicators Headache    Pain Type Chronic pain    Pain Onset More than a month ago    Pain Frequency Intermittent                             [facility] Adult PT Treatment/Exercise - 01/05/21 0001      Exercises   Exercises Neck      Neck Exercises: Seated   Cervical Rotation Both;5 reps    Lateral Flexion Both;Other (comment)   3 reps 10 sec hold to each side for stretch   Shoulder Rolls Backwards;Forwards;10 reps    Other Seated Exercise gentle scapular retractions - 5 reps in seated position           Vestibular Treatment/Exercise - 01/05/21 1347      Vestibular  Treatment/Exercise   Gaze Exercises X1 Viewing Horizontal;X1 Viewing Vertical      X1 Viewing Horizontal   Foot Position seated position on 1st rep - approx. 5' away from target    Time --   30 secs   Reps 1    Comments pt performed x1 viewing in standing position for 15 secs on 2nd rep      X1 Viewing Vertical   Foot Position seated position on 1st rep - 5' away from target  Time --   30 secs;   Reps 1    Comments pt performed x1 viewing in standing on 2nd rep - holding onto back of chair prn              Balance Exercises - 01/05/21 0001      Balance Exercises: Standing   Standing Eyes Opened Wide (BOA);Head turns;Solid surface;5 reps    Standing Eyes Closed Wide (BOA);Head turns;Foam/compliant surface;5 reps   horizontal & vertical - pt c/o nausea after this exercise         Pt performed marching on solid surface - 5 reps with EO; then attempted marching with head turns with EO (horizontal) but pt Reported increased symptoms so this activity was discontinued after 5 reps  Pt performed sit to stand - step & pivot turn to Lt side and then return to sitting; stand - step and pivot turn to Rt side - 3 reps To each side;  Pt reported > dizziness and also increase in tinnitus when performing pivot turn toward Lt side     PT Education - 01/05/21 2008    Education Details instructed pt to increase amb. in home as much as possible; amb. with horizontal head turns along counter top for UE support prn; progress x1 viewing to standing position as tolerated - if only for 15 secs as she performed in today's session (in standing)    Person(s) Educated Patient    Methods Explanation;Demonstration    Comprehension Verbalized understanding;Returned demonstration            PT Short Term Goals - 01/05/21 2026      PT SHORT TERM GOAL #1   Title Full vestibular assessment will be performed.    Baseline unable to fully assess at eval.    Time 4    Period Weeks    Status New     Target Date 01/22/21      PT SHORT TERM GOAL #2   Title Pt will be independent in initial HEP for balance and vestibular exercises.    Time 4    Period Weeks    Status New    Target Date 01/22/21      PT SHORT TERM GOAL #3   Title Pt will report <3/10 pain in neck/head with activities for improved function.    Baseline 3/10 currently but increases with activities    Time 4    Period Weeks    Status New    Target Date 01/22/21      PT SHORT TERM GOAL #4   Title DGI will be performed and LTG written.    Time 4    Period Weeks    Status New    Target Date 01/22/21      PT SHORT TERM GOAL #5   Title Pt will decrease 5 x sit to stand from 20 sec to <16 sec for improved balance.    Baseline 12/23/20 20 sec without hands from chair with 5/10 dizziness    Time 4    Period Weeks    Status New    Target Date 01/22/21             PT Long Term Goals - 01/05/21 2026      PT LONG TERM GOAL #1   Title Pt will be independent with progressive HEP for balance and vestibular exercises to continue gains on own.    Time 8    Period Weeks    Status New  PT LONG TERM GOAL #2   Title Pt will increase gait speed to >0.41m/s for improved gait safety.    Baseline 12/23/20 0.85m/s    Time 8    Period Weeks    Status New      PT LONG TERM GOAL #3   Title Pt will be able to perform functional activities with <3/10 dizziness for improved function.    Baseline currently varies but increases significantly with turning at all. 5/10 dizziness with 5 x sit to stand.    Time 8    Period Weeks    Status New      PT LONG TERM GOAL #4   Title Pt will ambulate >500' on varied surfaces independently for improved community mobility.    Time 8    Period Weeks    Status New      PT LONG TERM GOAL #5   Title DGI goal TBD.    Time 8    Period Weeks    Status New      PT LONG TERM GOAL #6   Title Pt will demonstrate 10 deg increase in pain free cervical ROM to improve cervical  proprioception and vestibular function.    Baseline see flow sheets    Time 8    Period Weeks                 Plan - 01/05/21 2020    Clinical Impression Statement Pt demonstrates motion sensitivity with decreased vestibular input in maintaining balance as evidenced by increased c/o dizziness with quick turns (pt moves en bloc) and has minimal to no dissociation of head and trunk with rotational movements.  Pt c/o nausea with standing on floor with EC with head turns and also with amb. with head turns without UE support for orientation.  Pt reported less dizziness when allowed to lightly touch // bar for support with amb. with head turns.  Cont with POC.    Personal Factors and Comorbidities Comorbidity 3+    Comorbidities PMH: abnormal thyroid function, ADHD, anxiety, arthritis, chronic kidney disease, DJD, fibromyalgia, tinnitus left ear    Examination-Activity Limitations Locomotion Level;Transfers;Stairs;Stand;Dressing;Bathing    Examination-Participation Restrictions Community Activity;Yard Work;Driving    Stability/Clinical Decision Making Evolving/Moderate complexity    Rehab Potential Good    PT Frequency 2x / week   plus eval   PT Duration 8 weeks    PT Treatment/Interventions ADLs/Self Care Home Management;Cryotherapy;Moist Heat;DME Instruction;Therapeutic activities;Functional mobility training;Stair training;Gait training;Therapeutic exercise;Balance training;Neuromuscular re-education;Canalith Repostioning;Patient/family education;Vestibular;Passive range of motion;Manual techniques;Spinal Manipulations    PT Next Visit Plan Check exercises added on 01-06-20 (see pt instructions) - continue to focus on neck ROM/strengthening/proprioception; vestibular HEP: x1 viewing in sitting to begin, habituation to motion sensitivity, standing balance with head/body turns.  Assess DGI when able and add LTG.    Consulted and Agree with Plan of Care Patient           Patient will benefit  from skilled therapeutic intervention in order to improve the following deficits and impairments:  Abnormal gait,Decreased balance,Decreased mobility,Decreased strength,Decreased range of motion,Pain  Visit Diagnosis: Dizziness and giddiness  Unsteadiness on feet     Problem List Patient Active Problem List   Diagnosis Date Noted  . 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 [place], [place], [address] Phone: [phone]   Fax:  [phone]  Name: Alvah Dutkiewicz MRN: [id number] Date of  Birth: 20-Feb-1946

## 2021-01-06 ENCOUNTER — Ambulatory Visit: Payer: PPO

## 2021-01-06 DIAGNOSIS — R2689 Other abnormalities of gait and mobility: Secondary | ICD-10-CM

## 2021-01-06 DIAGNOSIS — R42 Dizziness and giddiness: Secondary | ICD-10-CM

## 2021-01-06 DIAGNOSIS — R2681 Unsteadiness on feet: Secondary | ICD-10-CM

## 2021-01-06 NOTE — Therapy (Signed)
[facility] [address] [place], [place], [address] Phone: [phone]   Fax:  [phone]  Physical Therapy Treatment  Patient Details  Name: Wendy Joyce MRN: [id number] Date of Birth: 11-05-1946 Referring Provider (PT): Asencion Partridge Dohmeier   Encounter Date: 01/06/2021   PT End of Session - 01/06/21 1403    Visit Number 5    Number of Visits 17    Date for PT Re-Evaluation 03/23/21   60 day poc, 90 day cert   Authorization Type HTA medicare so 10th visit progress note    PT Start Time 1404    PT Stop Time 1445    PT Time Calculation (min) 41 min    Activity Tolerance Patient tolerated treatment well    Behavior During Therapy [facility] for tasks assessed/performed           Past Medical History:  Diagnosis Date   Abdominal pain, generalized 01/29/2015   Abnormal thyroid function test 02/25/2016   ADHD (attention deficit hyperactivity disorder)    Allergic rhinitis, unspecified 01/29/2015   Ankle sprain 03/16/2016   Anxiety 01/29/2015   Arthritis    hands   Chronic kidney disease 2012   kidney stones   Constipation 01/29/2015   Decreased libido 05/02/2018   Disorder of the skin and subcutaneous tissue, unspecified 01/29/2015   DJD (degenerative joint disease)    hand   Dysthymia    Endometriosis    External otitis    Family history of coronary artery disease 05/02/2018   Fatigue due to excessive exertion 02/24/2016   Fever blister    Fibromyalgia 01/29/2015   Frequency of micturition 01/29/2015   Functional dyspepsia 01/29/2015   Gastro-esophageal reflux disease without esophagitis 01/29/2015   Headache(784.0)    High cholesterol    Hyperlipidemia 09/12/2016   Hypersomnia    Hypothyroidism    IBS (irritable bowel syndrome) 05/02/2018   Insomnia    Internal hemorrhoids    Irritable 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 01/29/2015   Labial pain 11/30/2016   Lactose  intolerance 12/14/2016   Lactose intolerance in adult 09/12/2016   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   Left ureteral stone    LP (lichen planus) 91/63/8466   Major depressive disorder, single episode, unspecified    Migraine with aura, not intractable, without status migrainosus    Mixed hyperlipidemia    Mouth pain 01/07/2016   Osteoarthritis of hip    Osteopenia determined by x-ray 03/02/2017   Personal history of estrogen therapy    Phlebitis and thrombophlebitis of the leg    Pityriasis alba 01/29/2015   Pure hypercholesterolemia 06/29/2016   Rectum pain 09/12/2016   Recurrent sinus infections 02/24/2016   Rhinosinusitis 01/07/2016   Rosacea    Sinusitis 12/24/2015   Tinnitus of left ear 59/93/5701   Umbilical hernia without obstruction and without gangrene 77/93/9030   Umbilical pain 09/17/3006   Unspecified urinary incontinence 01/07/2016   Urticaria    UTI (urinary tract infection)    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   Vitamin D deficiency     Past Surgical History:  Procedure Laterality Date   ABDOMINAL HYSTERECTOMY     partial   BREAST ENHANCEMENT SURGERY     BREAST REDUCTION SURGERY Bilateral 2018   COLONOSCOPY WITH PROPOFOL N/A 02/25/2013   Procedure: COLONOSCOPY WITH PROPOFOL;  Surgeon: Garlan Fair, MD;  Location: [facility];  Service: Endoscopy;  Laterality: N/A;   mini facelift  2000   NASAL SINUS  SURGERY     ROTATOR CUFF REPAIR     Right   ROTATOR CUFF REPAIR     Left   SEPTOPLASTY WITH ETHMOIDECTOMY, AND MAXILLARY ANTROSTOMY Bilateral 2006   TOTAL ABDOMINAL HYSTERECTOMY     TUBAL LIGATION     umbicial hernia      There were no vitals filed for this visit.   Subjective Assessment - 01/06/21 1406    Subjective Patient reports no new changes. Patient reports that she feels has been clumsy. One of her fingers on the L hand has been bothering her, unsure how  she injured it. Patient reports HA is better today.    Pertinent History PMH: abnormal thyroid function, ADHD, anxiety, arthritis, chronic kidney disease, DJD, fibromyalgia, tinnitus left ear    Diagnostic tests MRI cervical 12/13/20:     IMPRESSION: This MRI of the cervical spine with and without contrast shows the following:  1.  Multilevel degenerative changes as detailed above that do not lead to spinal stenosis or nerve root compression.  2.  3 mm retrolisthesis of C5 upon C6 and 1 mm anterolisthesis of C6 upon C7.  3.  The spinal cord appears normal  4.   Normal enhancement pattern.        MRI brain 12/13/20:        IMPRESSION: This MRI of the brain with and without contrast shows the following:  1.   Mild generalized cortical atrophy, typical for age.  2.   Few punctate T2/FLAIR hyperintense foci in the hemispheres consistent with negligible chronic microvascular ischemic change.  3.   No acute findings.  4.   Normal enhancement pattern.    Patient Stated Goals Pt wants to move like she is 20 years younger feeling more confident moving around.    Currently in Pain? Yes    Pain Score 4     Pain Location Head    Pain Orientation Anterior   B temple region   Pain Descriptors / Indicators Headache;Dull                    [facility] Adult PT Treatment/Exercise - 01/06/21 0001      Self-Care   Self-Care Other Self-Care Comments    Other Self-Care Comments  PT educated on migraine glassses as potential option to help manage symptoms. PT provided glasses for patient, and patient wore them throughout session with flourscent lights on. Patient reporting reduced HA and improved symptoms with wearing. PT educating on purchase option and will continue to wear during session to determine benefit going forward.      Manual Therapy   Manual Therapy Soft tissue mobilization    Soft tissue mobilization Due to increased muscular tension, completed STM to B suboccipitals, B upper traps, and B levator  scap. Patient has multiple trigger points throughout B Upper trap with increased tenderness and referred symptoms with palpation. Patient able to tolerate manual therapy x 14 minutes with intermittent sustained pressure for trigger point release. Patient reports improvements after completion of manual therapy.           Vestibular Treatment/Exercise - 01/06/21 0001      Vestibular Treatment/Exercise   Gaze Exercises X1 Viewing Horizontal;X1 Viewing Vertical   all completed with [employer] migraines glasses on     X1 Viewing Horizontal   Foot Position standing feet apart    Reps 4    Comments completed 2 x 20 seconds, then progressed to completing 2 x 30 seconds.  X1 Viewing Vertical   Foot Position standing feet apart    Reps 2    Comments completed x 2 reps for 30 secs, minimal symptoms              Balance Exercises - 01/06/21 0001      Balance Exercises: Standing   Standing Eyes Opened Narrow base of support (BOS);Head turns;Solid surface;Limitations    Standing Eyes Opened Limitations completed horizontal/vertical head turns x 5 reps each direction. mild symptoms    Standing Eyes Closed Narrow base of support (BOS);Solid surface;3 reps;30 secs;Limitations    Standing Eyes Closed Limitations completed eyes closed 3 x 30 seconds. no complaints of nausea today.             PT Education - 01/06/21 1633    Education Details Educated on Migraine Glasses    Person(s) Educated Patient    Methods Explanation    Comprehension Verbalized understanding            PT Short Term Goals - 01/05/21 2026      PT SHORT TERM GOAL #1   Title Full vestibular assessment will be performed.    Baseline unable to fully assess at eval.    Time 4    Period Weeks    Status New    Target Date 01/22/21      PT SHORT TERM GOAL #2   Title Pt will be independent in initial HEP for balance and vestibular exercises.    Time 4    Period Weeks    Status New    Target Date 01/22/21       PT SHORT TERM GOAL #3   Title Pt will report <3/10 pain in neck/head with activities for improved function.    Baseline 3/10 currently but increases with activities    Time 4    Period Weeks    Status New    Target Date 01/22/21      PT SHORT TERM GOAL #4   Title DGI will be performed and LTG written.    Time 4    Period Weeks    Status New    Target Date 01/22/21      PT SHORT TERM GOAL #5   Title Pt will decrease 5 x sit to stand from 20 sec to <16 sec for improved balance.    Baseline 12/23/20 20 sec without hands from chair with 5/10 dizziness    Time 4    Period Weeks    Status New    Target Date 01/22/21             PT Long Term Goals - 01/05/21 2026      PT LONG TERM GOAL #1   Title Pt will be independent with progressive HEP for balance and vestibular exercises to continue gains on own.    Time 8    Period Weeks    Status New      PT LONG TERM GOAL #2   Title Pt will increase gait speed to >0.7456m/s for improved gait safety.    Baseline 12/23/20 0.5897m/s    Time 8    Period Weeks    Status New      PT LONG TERM GOAL #3   Title Pt will be able to perform functional activities with <3/10 dizziness for improved function.    Baseline currently varies but increases significantly with turning at all. 5/10 dizziness with 5 x sit to stand.    Time 8  Period Weeks    Status New      PT LONG TERM GOAL #4   Title Pt will ambulate >500' on varied surfaces independently for improved community mobility.    Time 8    Period Weeks    Status New      PT LONG TERM GOAL #5   Title DGI goal TBD.    Time 8    Period Weeks    Status New      PT LONG TERM GOAL #6   Title Pt will demonstrate 10 deg increase in pain free cervical ROM to improve cervical proprioception and vestibular function.    Baseline see flow sheets    Time 8    Period Weeks                 Plan - 01/06/21 1634    Clinical Impression Statement PT educating on migraine glasses due  to continued increased symptoms and migraines. Patient wore glasses throughout session, and reports significant improvement in symptoms. Continued balance activites working toward improved vestibular input, and continued progression of VOR x 1 in standing. Initiated manual therapy to address muscle tension, with improved symptoms reported. Will contnue to progress toward all LTGs.    Personal Factors and Comorbidities Comorbidity 3+    Comorbidities PMH: abnormal thyroid function, ADHD, anxiety, arthritis, chronic kidney disease, DJD, fibromyalgia, tinnitus left ear    Examination-Activity Limitations Locomotion Level;Transfers;Stairs;Stand;Dressing;Bathing    Examination-Participation Restrictions Community Activity;Yard Work;Driving    Stability/Clinical Decision Making Evolving/Moderate complexity    Rehab Potential Good    PT Frequency 2x / week   plus eval   PT Duration 8 weeks    PT Treatment/Interventions ADLs/Self Care Home Management;Cryotherapy;Moist Heat;DME Instruction;Therapeutic activities;Functional mobility training;Stair training;Gait training;Therapeutic exercise;Balance training;Neuromuscular re-education;Canalith Repostioning;Patient/family education;Vestibular;Passive range of motion;Manual techniques;Spinal Manipulations    PT Next Visit Plan continue to focus on neck ROM/strengthening/proprioception; vestibular HEP: x1 viewing in standing. habituation to motion sensitivity, standing balance with head/body turns.  Assess DGI when able and add LTG. 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

## 2021-01-07 DIAGNOSIS — R55 Syncope and collapse: Secondary | ICD-10-CM | POA: Diagnosis not present

## 2021-01-11 ENCOUNTER — Ambulatory Visit: Payer: PPO | Admitting: Physical Therapy

## 2021-01-14 ENCOUNTER — Other Ambulatory Visit: Payer: Self-pay

## 2021-01-14 ENCOUNTER — Ambulatory Visit: Payer: PPO

## 2021-01-14 DIAGNOSIS — R2689 Other abnormalities of gait and mobility: Secondary | ICD-10-CM

## 2021-01-14 DIAGNOSIS — R42 Dizziness and giddiness: Secondary | ICD-10-CM

## 2021-01-14 DIAGNOSIS — R2681 Unsteadiness on feet: Secondary | ICD-10-CM

## 2021-01-14 NOTE — Patient Instructions (Signed)
Walk down your hallway (using light touch from hallway as needed), and completed head turns to right and left. Complete 3 laps down your hallway. Rest as needed.

## 2021-01-14 NOTE — Therapy (Signed)
[facility] [address] [place], [place], [address] Phone: [phone]   Fax:  [phone]  Physical Therapy Treatment  Patient Details  Name: Wendy Joyce MRN: [id number] Date of Birth: 11/26/1946 Referring Provider (PT): Asencion Partridge Dohmeier   Encounter Date: 01/14/2021   PT End of Session - 01/14/21 1400    Visit Number 6    Number of Visits 17    Date for PT Re-Evaluation 03/23/21   60 day poc, 90 day cert   Authorization Type H[employer] medicare so 10th visit progress note    PT Start Time 1400    PT Stop Time 1445    PT Time Calculation (min) 45 min    Activity Tolerance Patient tolerated treatment well    Behavior During Therapy [facility] for tasks assessed/performed           Past Medical History:  Diagnosis Date  . Abdominal pain, generalized 01/29/2015  . Abnormal thyroid function test 02/25/2016  . ADHD (attention deficit hyperactivity disorder)   . Allergic rhinitis, unspecified 01/29/2015  . Ankle sprain 03/16/2016  . Anxiety 01/29/2015  . Arthritis    hands  . Chronic kidney disease 2012   kidney stones  . Constipation 01/29/2015  . Decreased libido 05/02/2018  . Disorder of the skin and subcutaneous tissue, unspecified 01/29/2015  . DJD (degenerative joint disease)    hand  . Dysthymia   . Endometriosis   . External otitis   . Family history of coronary artery disease 05/02/2018  . Fatigue due to excessive exertion 02/24/2016  . Fever blister   . Fibromyalgia 01/29/2015  . Frequency of micturition 01/29/2015  . Functional dyspepsia 01/29/2015  . Gastro-esophageal reflux disease without esophagitis 01/29/2015  . Headache(784.0)   . High cholesterol   . Hyperlipidemia 09/12/2016  . Hypersomnia   . Hypothyroidism   . IBS (irritable bowel syndrome) 05/02/2018  . Insomnia   . Internal hemorrhoids   . 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 Labial pain 11/30/2016  . Lactose  intolerance 12/14/2016  . Lactose intolerance in adult 09/12/2016  . 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 Left ureteral stone   . LP (lichen planus) 62/70/3500  . Major depressive disorder, single episode, unspecified   . Migraine with aura, not intractable, without status migrainosus   . Mixed hyperlipidemia   . Mouth pain 01/07/2016  . Osteoarthritis of hip   . Osteopenia determined by x-ray 03/02/2017  . Personal history of estrogen therapy   . Phlebitis and thrombophlebitis of the leg   . Pityriasis alba 01/29/2015  . Pure hypercholesterolemia 06/29/2016  . Rectum pain 09/12/2016  . Recurrent sinus infections 02/24/2016  . Rhinosinusitis 01/07/2016  . Rosacea   . Sinusitis 12/24/2015  . 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 Umbilical hernia without obstruction and without gangrene 01/29/2015  . Umbilical pain 93/81/8299  . Unspecified urinary incontinence 01/07/2016  . Urticaria   . UTI (urinary tract infection)   . 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 Vitamin D deficiency     Past Surgical History:  Procedure Laterality Date  . ABDOMINAL HYSTERECTOMY     partial  . BREAST ENHANCEMENT SURGERY    . BREAST REDUCTION SURGERY Bilateral 2018  . COLONOSCOPY WITH PROPOFOL N/A 02/25/2013   Procedure: COLONOSCOPY WITH PROPOFOL;  Surgeon: Garlan Fair, MD;  Location: [facility];  Service: Endoscopy;  Laterality: N/A;  . mini facelift  2000  . NASAL SINUS  SURGERY    . ROTATOR CUFF REPAIR     Right  . ROTATOR CUFF REPAIR     Left  . SEPTOPLASTY WITH ETHMOIDECTOMY, AND MAXILLARY ANTROSTOMY Bilateral 2006  . TOTAL ABDOMINAL HYSTERECTOMY    . TUBAL LIGATION    . umbicial hernia      There were no vitals filed for this visit.   Subjective Assessment - 01/14/21 1400    Subjective Patient reports that ordered the [employer]. Reports overall feeling better. Reports has minor migraine currently. Reports felt good last time after  she left last time.    Pertinent History PMH: abnormal thyroid function, ADHD, anxiety, arthritis, chronic kidney disease, DJD, fibromyalgia, tinnitus left ear    Diagnostic tests MRI cervical 12/13/20:     IMPRESSION: This MRI of the cervical spine with and without contrast shows the following:  1.  Multilevel degenerative changes as detailed above that do not lead to spinal stenosis or nerve root compression.  2.  3 mm retrolisthesis of C5 upon C6 and 1 mm anterolisthesis of C6 upon C7.  3.  The spinal cord appears normal  4.   Normal enhancement pattern.        MRI brain 12/13/20:        IMPRESSION: This MRI of the brain with and without contrast shows the following:  1.   Mild generalized cortical atrophy, typical for age.  2.   Few punctate T2/FLAIR hyperintense foci in the hemispheres consistent with negligible chronic microvascular ischemic change.  3.   No acute findings.  4.   Normal enhancement pattern.    Patient Stated Goals Pt wants to move like she is 20 years younger feeling more confident moving around.    Currently in Pain? Yes    Pain Score 2     Pain Location Head    Pain Orientation Anterior    Pain Descriptors / Indicators Headache;Dull    Pain Type Chronic pain              [facility] PT Assessment - 01/14/21 0001      AROM   Overall AROM  Deficits    AROM Assessment Site Cervical   measured per patient requests   Cervical Extension 48    Cervical - Right Rotation 59    Cervical - Left Rotation 58                 [facility] Adult PT Treatment/Exercise - 01/14/21 0001      Ambulation/Gait   Ambulation/Gait Yes    Ambulation/Gait Assistance 5: Supervision    Ambulation/Gait Assistance Details with balance activities    Assistive device None    Gait Pattern Step-through pattern;Decreased step length - right;Decreased step length - left;Decreased arm swing - right;Decreased arm swing - left;Decreased trunk rotation    Ambulation Surface Level;Indoor       Standardized Balance Assessment   Standardized Balance Assessment Dynamic Gait Index      Dynamic Gait Index   Level Surface Mild Impairment    Change in Gait Speed Normal    Gait with Horizontal Head Turns Mild Impairment   reports increased imbalance   Gait with Vertical Head Turns Moderate Impairment   reports increased nauseous sensation   Gait and Pivot Turn Mild Impairment    Step Over Obstacle Normal    Step Around Obstacles Normal    Steps Normal    Total Score 19      High Level Balance   High  Level Balance Activities Head turns    High Level Balance Comments ambulation with horizontal head turns, 2 x 25 ft. educated on completion at home with HEP, handout provided      Manual Therapy   Manual Therapy Soft tissue mobilization    Soft tissue mobilization Continued STM to B cervical paraspinals, B upper traps x 5 minutes. Multiple trigger points noted. Sustained pressure applied on trigger points, patient reporting relief after manual therapy. Reports agreement to try dry needling at next session to further address muscle tension.           Vestibular Treatment/Exercise - 01/14/21 0001      Vestibular Treatment/Exercise   Habituation Exercises Standing Horizontal Head Turns;Standing Vertical Head Turns      Standing Horizontal Head Turns   Number of Reps  2    Symptom Description  x 10 head turns. compelted with looking toward target horizontally. first set completed with pauses, minor symptoms. second set compelted with contiunous motion, increased symptoms (5/10).      Standing Vertical Head Turns   Number of Reps  2    Symptom Description  x 10 reps. mild symptoms with completion (3-4/10). intermittent standing rest breaks required to allow for resolution of symptoms.                 PT Education - 01/14/21 1445    Education Details HEP Update (walking with head turns)    Person(s) Educated Patient    Methods Explanation    Comprehension Verbalized  understanding            PT Short Term Goals - 01/14/21 1533      PT SHORT TERM GOAL #1   Title Full vestibular assessment will be performed.    Baseline unable to fully assess at eval.    Time 4    Period Weeks    Status New    Target Date 01/22/21      PT SHORT TERM GOAL #2   Title Pt will be independent in initial HEP for balance and vestibular exercises.    Time 4    Period Weeks    Status New    Target Date 01/22/21      PT SHORT TERM GOAL #3   Title Pt will report <3/10 pain in neck/head with activities for improved function.    Baseline 3/10 currently but increases with activities    Time 4    Period Weeks    Status New    Target Date 01/22/21      PT SHORT TERM GOAL #4   Title DGI will be performed and LTG written.    Baseline 19/24 on 01/14/21    Time 4    Period Weeks    Status Achieved    Target Date 01/22/21      PT SHORT TERM GOAL #5   Title Pt will decrease 5 x sit to stand from 20 sec to <16 sec for improved balance.    Baseline 12/23/20 20 sec without hands from chair with 5/10 dizziness    Time 4    Period Weeks    Status New    Target Date 01/22/21             PT Long Term Goals - 01/14/21 1534      PT LONG TERM GOAL #1   Title Pt will be independent with progressive HEP for balance and vestibular exercises to continue gains on own.    Time  8    Period Weeks    Status New      PT LONG TERM GOAL #2   Title Pt will increase gait speed to >0.1m/s for improved gait safety.    Baseline 12/23/20 0.39m/s    Time 8    Period Weeks    Status New      PT LONG TERM GOAL #3   Title Pt will be able to perform functional activities with <3/10 dizziness for improved function.    Baseline currently varies but increases significantly with turning at all. 5/10 dizziness with 5 x sit to stand.    Time 8    Period Weeks    Status New      PT LONG TERM GOAL #4   Title Pt will ambulate >500' on varied surfaces independently for improved community  mobility.    Time 8    Period Weeks    Status New      PT LONG TERM GOAL #5   Title Patient will improve DGI to >/= 22/24 to demonstrate improved balance and reduced fall risk    Baseline 19/24    Time 8    Period Weeks    Status Revised      PT LONG TERM GOAL #6   Title Pt will demonstrate 10 deg increase in pain free cervical ROM to improve cervical proprioception and vestibular function.    Baseline see flow sheets    Time 8    Period Weeks                 Plan - 01/14/21 1532    Clinical Impression Statement Today's skilled PT session included assessment of DGI, with patient scoring 19/24 demonstrating increased fall risk. Most difficulty noted with horizontal/vertical head turns. Rest of session spent focused on habituation activities, included head turns with static and dynamic tasks. Continued to wear migraine glasses throughout session. Will continue to progress toward all goals and continue per POC.    Personal Factors and Comorbidities Comorbidity 3+    Comorbidities PMH: abnormal thyroid function, ADHD, anxiety, arthritis, chronic kidney disease, DJD, fibromyalgia, tinnitus left ear    Examination-Activity Limitations Locomotion Level;Transfers;Stairs;Stand;Dressing;Bathing    Examination-Participation Restrictions Community Activity;Yard Work;Driving    Stability/Clinical Decision Making Evolving/Moderate complexity    Rehab Potential Good    PT Frequency 2x / week   plus eval   PT Duration 8 weeks    PT Treatment/Interventions ADLs/Self Care Home Management;Cryotherapy;Moist Heat;DME Instruction;Therapeutic activities;Functional mobility training;Stair training;Gait training;Therapeutic exercise;Balance training;Neuromuscular re-education;Canalith Repostioning;Patient/family education;Vestibular;Passive range of motion;Manual techniques;Spinal Manipulations    PT Next Visit Plan Patient wants to try dry needling for muscle tension. Patient has ordered [employer]  glasses. Continue to focus on neck ROM/strengthening/proprioception; vestibular HEP: x1 viewing in standing. habituation to motion sensitivity, standing balance with head/body turns.  Assess DGI when able and add LTG. Patient interested in dry needling    Consulted and Agree with Plan of Care Patient           Patient will benefit from skilled therapeutic intervention in order to improve the following deficits and impairments:  Abnormal gait,Decreased balance,Decreased mobility,Decreased strength,Decreased range of motion,Pain  Visit Diagnosis: Dizziness and giddiness  Other abnormalities of gait and mobility  Unsteadiness on feet     Problem List Patient Active Problem List   Diagnosis Date Noted  . 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

## 2021-01-18 ENCOUNTER — Ambulatory Visit: Payer: PPO | Admitting: Physical Therapy

## 2021-01-18 ENCOUNTER — Encounter: Payer: Self-pay | Admitting: Physical Therapy

## 2021-01-18 ENCOUNTER — Other Ambulatory Visit: Payer: Self-pay

## 2021-01-18 DIAGNOSIS — G4700 Insomnia, unspecified: Secondary | ICD-10-CM | POA: Diagnosis not present

## 2021-01-18 DIAGNOSIS — Z79890 Hormone replacement therapy (postmenopausal): Secondary | ICD-10-CM | POA: Diagnosis not present

## 2021-01-18 DIAGNOSIS — R2681 Unsteadiness on feet: Secondary | ICD-10-CM

## 2021-01-18 DIAGNOSIS — Z789 Other specified health status: Secondary | ICD-10-CM | POA: Diagnosis not present

## 2021-01-18 DIAGNOSIS — M542 Cervicalgia: Secondary | ICD-10-CM

## 2021-01-18 DIAGNOSIS — R55 Syncope and collapse: Secondary | ICD-10-CM | POA: Diagnosis not present

## 2021-01-18 DIAGNOSIS — H539 Unspecified visual disturbance: Secondary | ICD-10-CM | POA: Diagnosis not present

## 2021-01-18 DIAGNOSIS — E782 Mixed hyperlipidemia: Secondary | ICD-10-CM | POA: Diagnosis not present

## 2021-01-18 DIAGNOSIS — R2689 Other abnormalities of gait and mobility: Secondary | ICD-10-CM | POA: Diagnosis not present

## 2021-01-18 DIAGNOSIS — R42 Dizziness and giddiness: Secondary | ICD-10-CM

## 2021-01-18 DIAGNOSIS — E559 Vitamin D deficiency, unspecified: Secondary | ICD-10-CM | POA: Diagnosis not present

## 2021-01-18 NOTE — Therapy (Signed)
[facility] [address] [place], [place], [address] Phone: [phone]   Fax:  [phone]  Physical Therapy Treatment  Patient Details  Name: Wendy Joyce MRN: [id number] Date of Birth: 28-Nov-1946 Referring Provider (PT): Asencion Partridge Dohmeier   Encounter Date: 01/18/2021   PT End of Session - 01/18/21 2057    Visit Number 7    Number of Visits 17    Date for PT Re-Evaluation 03/23/21   60 day poc, 90 day cert   Authorization Type HTA medicare so 10th visit progress note    Progress Note Due on Visit 10    PT Start Time 1412    PT Stop Time 1504    PT Time Calculation (min) 52 min    Equipment Utilized During Treatment Other (comment)   dry needles   Activity Tolerance Patient tolerated treatment well    Behavior During Therapy [facility] for tasks assessed/performed           Past Medical History:  Diagnosis Date  . Abdominal pain, generalized 01/29/2015  . Abnormal thyroid function test 02/25/2016  . ADHD (attention deficit hyperactivity disorder)   . Allergic rhinitis, unspecified 01/29/2015  . Ankle sprain 03/16/2016  . Anxiety 01/29/2015  . Arthritis    hands  . Chronic kidney disease 2012   kidney stones  . Constipation 01/29/2015  . Decreased libido 05/02/2018  . Disorder of the skin and subcutaneous tissue, unspecified 01/29/2015  . DJD (degenerative joint disease)    hand  . Dysthymia   . Endometriosis   . External otitis   . Family history of coronary artery disease 05/02/2018  . Fatigue due to excessive exertion 02/24/2016  . Fever blister   . Fibromyalgia 01/29/2015  . Frequency of micturition 01/29/2015  . Functional dyspepsia 01/29/2015  . Gastro-esophageal reflux disease without esophagitis 01/29/2015  . Headache(784.0)   . High cholesterol   . Hyperlipidemia 09/12/2016  . Hypersomnia   . Hypothyroidism   . IBS (irritable bowel syndrome) 05/02/2018  . Insomnia   . Internal  hemorrhoids   . 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 Labial pain 11/30/2016  . Lactose intolerance 12/14/2016  . Lactose intolerance in adult 09/12/2016  . 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 Left ureteral stone   . LP (lichen planus) 25/36/6440  . Major depressive disorder, single episode, unspecified   . Migraine with aura, not intractable, without status migrainosus   . Mixed hyperlipidemia   . Mouth pain 01/07/2016  . Osteoarthritis of hip   . Osteopenia determined by x-ray 03/02/2017  . Personal history of estrogen therapy   . Phlebitis and thrombophlebitis of the leg   . Pityriasis alba 01/29/2015  . Pure hypercholesterolemia 06/29/2016  . Rectum pain 09/12/2016  . Recurrent sinus infections 02/24/2016  . Rhinosinusitis 01/07/2016  . Rosacea   . Sinusitis 12/24/2015  . 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 Umbilical hernia without obstruction and without gangrene 01/29/2015  . Umbilical pain 34/74/2595  . Unspecified urinary incontinence 01/07/2016  . Urticaria   . UTI (urinary tract infection)   . 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 Vitamin D deficiency     Past Surgical History:  Procedure Laterality Date  . ABDOMINAL HYSTERECTOMY     partial  . BREAST ENHANCEMENT SURGERY    . BREAST REDUCTION SURGERY Bilateral 2018  . COLONOSCOPY WITH PROPOFOL N/A 02/25/2013   Procedure: COLONOSCOPY WITH PROPOFOL;  Surgeon: Garlan Fair,  MD;  Location: [facility];  Service: Endoscopy;  Laterality: N/A;  . mini facelift  2000  . NASAL SINUS SURGERY    . ROTATOR CUFF REPAIR     Right  . ROTATOR CUFF REPAIR     Left  . SEPTOPLASTY WITH ETHMOIDECTOMY, AND MAXILLARY ANTROSTOMY Bilateral 2006  . TOTAL ABDOMINAL HYSTERECTOMY    . TUBAL LIGATION    . umbicial hernia      There were no vitals filed for this visit.   Subjective Assessment - 01/18/21 1415    Subjective Small HA today that started when she got  to therapy.  Slightly off balance walking back to treatment room.  Dizziness is better; has been trying to walk more normally and walk more; had some symptoms with turning and going down the stairs.  Was a little sore after manual therapy last session but had some relief.    Pertinent History PMH: abnormal thyroid function, ADHD, anxiety, arthritis, chronic kidney disease, DJD, fibromyalgia, tinnitus left ear    Diagnostic tests MRI cervical 12/13/20:     IMPRESSION: This MRI of the cervical spine with and without contrast shows the following:  1.  Multilevel degenerative changes as detailed above that do not lead to spinal stenosis or nerve root compression.  2.  3 mm retrolisthesis of C5 upon C6 and 1 mm anterolisthesis of C6 upon C7.  3.  The spinal cord appears normal  4.   Normal enhancement pattern.        MRI brain 12/13/20:        IMPRESSION: This MRI of the brain with and without contrast shows the following:  1.   Mild generalized cortical atrophy, typical for age.  2.   Few punctate T2/FLAIR hyperintense foci in the hemispheres consistent with negligible chronic microvascular ischemic change.  3.   No acute findings.  4.   Normal enhancement pattern.    Patient Stated Goals Pt wants to move like she is 20 years younger feeling more confident moving around.    Currently in Pain? Yes    Pain Score 2                              [facility] Adult PT Treatment/Exercise - 01/18/21 2054      Therapeutic Activites    Therapeutic Activities Other Therapeutic Activities    Other Therapeutic Activities Provided pt with handout and reviewed dry needling education: purpose and method, possible side effects and ways to mitigate side effects; screened patient for any contraindications: none found.  Pt consented to TDN.      Manual Therapy   Manual Therapy Joint mobilization    Manual therapy comments after TDN; performed in prone    Joint Mobilization Performed grade II/III PA  mobilizations to upper and middle thoracic spine bilaterally with greater hypomobility noted on L side      Neck Exercises: Stretches   Upper Trapezius Stretch Right;1 rep;30 seconds            Trigger Point Dry Needling - 01/18/21 1426    Consent Given? Yes    Education Handout Provided Yes    Muscles Treated Head and Neck Upper trapezius    Muscles Treated Upper Quadrant Rhomboids    Muscles Treated Back/Hip Thoracic multifidi;Erector spinae    Dry Needling Comments Performed upper trap in supine; performed to R side only.  Performed rhomboid and thoracic paraspinals in prone; performed to R  side.    Upper Trapezius Response Twitch reponse elicited;Palpable increased muscle length    Rhomboids Response Palpable increased muscle length    Erector spinae Response Palpable increased muscle length    Thoracic multifidi response Palpable increased muscle length                PT Education - 01/18/21 2057    Education Details TDN education, addition of 2-3 more TDN sessions    Person(s) Educated Patient    Methods Explanation;Handout    Comprehension Verbalized understanding            PT Short Term Goals - 01/14/21 1533      PT SHORT TERM GOAL #1   Title Full vestibular assessment will be performed.    Baseline unable to fully assess at eval.    Time 4    Period Weeks    Status New    Target Date 01/22/21      PT SHORT TERM GOAL #2   Title Pt will be independent in initial HEP for balance and vestibular exercises.    Time 4    Period Weeks    Status New    Target Date 01/22/21      PT SHORT TERM GOAL #3   Title Pt will report <3/10 pain in neck/head with activities for improved function.    Baseline 3/10 currently but increases with activities    Time 4    Period Weeks    Status New    Target Date 01/22/21      PT SHORT TERM GOAL #4   Title DGI will be performed and LTG written.    Baseline 19/24 on 01/14/21    Time 4    Period Weeks    Status Achieved     Target Date 01/22/21      PT SHORT TERM GOAL #5   Title Pt will decrease 5 x sit to stand from 20 sec to <16 sec for improved balance.    Baseline 12/23/20 20 sec without hands from chair with 5/10 dizziness    Time 4    Period Weeks    Status New    Target Date 01/22/21             PT Long Term Goals - 01/14/21 1534      PT LONG TERM GOAL #1   Title Pt will be independent with progressive HEP for balance and vestibular exercises to continue gains on own.    Time 8    Period Weeks    Status New      PT LONG TERM GOAL #2   Title Pt will increase gait speed to >0.39m/s for improved gait safety.    Baseline 12/23/20 0.33m/s    Time 8    Period Weeks    Status New      PT LONG TERM GOAL #3   Title Pt will be able to perform functional activities with <3/10 dizziness for improved function.    Baseline currently varies but increases significantly with turning at all. 5/10 dizziness with 5 x sit to stand.    Time 8    Period Weeks    Status New      PT LONG TERM GOAL #4   Title Pt will ambulate >500' on varied surfaces independently for improved community mobility.    Time 8    Period Weeks    Status New      PT LONG TERM GOAL #5  Title Patient will improve DGI to >/= 22/24 to demonstrate improved balance and reduced fall risk    Baseline 19/24    Time 8    Period Weeks    Status Revised      PT LONG TERM GOAL #6   Title Pt will demonstrate 10 deg increase in pain free cervical ROM to improve cervical proprioception and vestibular function.    Baseline see flow sheets    Time 8    Period Weeks                 Plan - 01/18/21 2059    Clinical Impression Statement Due to positive response to manual therapy/manual trigger point release continued to address cervical ROM limitations, tension HA and myofascial pain with trigger point dry needling, mobilizations and stretches.  Pt tolerated well and reported decreased tension at end of session.  Will continue  to address.    Personal Factors and Comorbidities Comorbidity 3+    Comorbidities PMH: abnormal thyroid function, ADHD, anxiety, arthritis, chronic kidney disease, DJD, fibromyalgia, tinnitus left ear    Examination-Activity Limitations Locomotion Level;Transfers;Stairs;Stand;Dressing;Bathing    Examination-Participation Restrictions Community Activity;Yard Work;Driving    Stability/Clinical Decision Making Evolving/Moderate complexity    Rehab Potential Good    PT Frequency 2x / week   plus eval   PT Duration 8 weeks    PT Treatment/Interventions ADLs/Self Care Home Management;Cryotherapy;Moist Heat;DME Instruction;Therapeutic activities;Functional mobility training;Stair training;Gait training;Therapeutic exercise;Balance training;Neuromuscular re-education;Canalith Repostioning;Patient/family education;Vestibular;Passive range of motion;Manual techniques;Spinal Manipulations;Dry needling    PT Next Visit Plan Check STG.  Continue to focus on neck ROM/strengthening/proprioception; vestibular HEP: x1 viewing in standing. habituation to motion sensitivity, standing balance with head/body turns.    Consulted and Agree with Plan of Care Patient           Patient will benefit from skilled therapeutic intervention in order to improve the following deficits and impairments:  Abnormal gait,Decreased balance,Decreased mobility,Decreased strength,Decreased range of motion,Pain  Visit Diagnosis: Dizziness and giddiness  Other abnormalities of gait and mobility  Unsteadiness on feet  Cervicalgia     Problem List Patient Active Problem List   Diagnosis Date Noted  . 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 [place], [place], [address] Phone: [phone]   Fax:  [phone]  Name: Wendy Joyce MRN: [id number] Date of Birth: October 29, 1946

## 2021-01-18 NOTE — Patient Instructions (Signed)

## 2021-01-20 ENCOUNTER — Ambulatory Visit: Payer: PPO | Admitting: Internal Medicine

## 2021-01-21 ENCOUNTER — Other Ambulatory Visit: Payer: Self-pay

## 2021-01-21 ENCOUNTER — Ambulatory Visit: Payer: PPO

## 2021-01-21 DIAGNOSIS — M542 Cervicalgia: Secondary | ICD-10-CM

## 2021-01-21 DIAGNOSIS — R2681 Unsteadiness on feet: Secondary | ICD-10-CM

## 2021-01-21 DIAGNOSIS — R2689 Other abnormalities of gait and mobility: Secondary | ICD-10-CM

## 2021-01-21 DIAGNOSIS — R42 Dizziness and giddiness: Secondary | ICD-10-CM

## 2021-01-21 NOTE — Patient Instructions (Signed)
Gaze Stabilization: Standing Feet Apart    Feet shoulder width apart, keeping eyes on target on wall 3-4 feet away, tilt head down 15-30 and move head side to side for 30 seconds. Repeat while moving head up and down for 30 seconds. Do 2-3 sessions per day. Repeat using target on pattern background.  Copyright  VHI. All rights reserved.

## 2021-01-21 NOTE — Therapy (Signed)
[facility] [address] [place], [place], [address] Phone: [phone]   Fax:  [phone]  Physical Therapy Treatment  Patient Details  Name: Wendy Joyce MRN: [id number] Date of Birth: 03-24-1946 Referring Provider (PT): Porfirio Mylar Dohmeier   Encounter Date: 01/21/2021   PT End of Session - 01/21/21 1405    Visit Number 8    Number of Visits 17    Date for PT Re-Evaluation 03/23/21   60 day poc, 90 day cert   Authorization Type HTA medicare so 10th visit progress note    Progress Note Due on Visit 10    PT Start Time 1405    PT Stop Time 1445    PT Time Calculation (min) 40 min    Equipment Utilized During Treatment Other (comment)   dry needles   Activity Tolerance Patient tolerated treatment well    Behavior During Therapy [facility] for tasks assessed/performed           Past Medical History:  Diagnosis Date  . Abdominal pain, generalized 01/29/2015  . Abnormal thyroid function test 02/25/2016  . ADHD (attention deficit hyperactivity disorder)   . Allergic rhinitis, unspecified 01/29/2015  . Ankle sprain 03/16/2016  . Anxiety 01/29/2015  . Arthritis    hands  . Chronic kidney disease 2012   kidney stones  . Constipation 01/29/2015  . Decreased libido 05/02/2018  . Disorder of the skin and subcutaneous tissue, unspecified 01/29/2015  . DJD (degenerative joint disease)    hand  . Dysthymia   . Endometriosis   . External otitis   . Family history of coronary artery disease 05/02/2018  . Fatigue due to excessive exertion 02/24/2016  . Fever blister   . Fibromyalgia 01/29/2015  . Frequency of micturition 01/29/2015  . Functional dyspepsia 01/29/2015  . Gastro-esophageal reflux disease without esophagitis 01/29/2015  . Headache(784.0)   . High cholesterol   . Hyperlipidemia 09/12/2016  . Hypersomnia   . Hypothyroidism   . IBS (irritable bowel syndrome) 05/02/2018  . Insomnia   . Internal  hemorrhoids   . 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 Labial pain 11/30/2016  . Lactose intolerance 12/14/2016  . Lactose intolerance in adult 09/12/2016  . 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 Left ureteral stone   . 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 Major depressive disorder, single episode, unspecified   . Migraine with aura, not intractable, without status migrainosus   . Mixed hyperlipidemia   . Mouth pain 01/07/2016  . Osteoarthritis of hip   . Osteopenia determined by x-ray 03/02/2017  . Personal history of estrogen therapy   . Phlebitis and thrombophlebitis of the leg   . Pityriasis alba 01/29/2015  . Pure hypercholesterolemia 06/29/2016  . Rectum pain 09/12/2016  . Recurrent sinus infections 02/24/2016  . Rhinosinusitis 01/07/2016  . Rosacea   . Sinusitis 12/24/2015  . 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 Umbilical hernia without obstruction and without gangrene 01/29/2015  . Umbilical pain 05/02/2018  . Unspecified urinary incontinence 01/07/2016  . Urticaria   . UTI (urinary tract infection)   . 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 Vitamin D deficiency     Past Surgical History:  Procedure Laterality Date  . ABDOMINAL HYSTERECTOMY     partial  . BREAST ENHANCEMENT SURGERY    . BREAST REDUCTION SURGERY Bilateral 2018  . COLONOSCOPY WITH PROPOFOL N/A 02/25/2013   Procedure: COLONOSCOPY WITH PROPOFOL;  Surgeon: Charolett Bumpers,  MD;  Location: [facility];  Service: Endoscopy;  Laterality: N/A;  . mini facelift  2000  . NASAL SINUS SURGERY    . ROTATOR CUFF REPAIR     Right  . ROTATOR CUFF REPAIR     Left  . SEPTOPLASTY WITH ETHMOIDECTOMY, AND MAXILLARY ANTROSTOMY Bilateral 2006  . TOTAL ABDOMINAL HYSTERECTOMY    . TUBAL LIGATION    . umbicial hernia      There were no vitals filed for this visit.   Subjective Assessment - 01/21/21 1407    Subjective Patient reports she has been doing the  head turns with ambulation, and it is increasing her dizziness. Has had some soreness after dry needling but believes it was beneficial. Slight HA currently.    Pertinent History PMH: abnormal thyroid function, ADHD, anxiety, arthritis, chronic kidney disease, DJD, fibromyalgia, tinnitus left ear    Diagnostic tests MRI cervical 12/13/20:     IMPRESSION: This MRI of the cervical spine with and without contrast shows the following:  1.  Multilevel degenerative changes as detailed above that do not lead to spinal stenosis or nerve root compression.  2.  3 mm retrolisthesis of C5 upon C6 and 1 mm anterolisthesis of C6 upon C7.  3.  The spinal cord appears normal  4.   Normal enhancement pattern.        MRI brain 12/13/20:        IMPRESSION: This MRI of the brain with and without contrast shows the following:  1.   Mild generalized cortical atrophy, typical for age.  2.   Few punctate T2/FLAIR hyperintense foci in the hemispheres consistent with negligible chronic microvascular ischemic change.  3.   No acute findings.  4.   Normal enhancement pattern.    Patient Stated Goals Pt wants to move like she is 20 years younger feeling more confident moving around.    Currently in Pain? Yes    Pain Score 4     Pain Location Head    Pain Orientation Anterior    Pain Descriptors / Indicators Headache    Pain Type Chronic pain              [facility] Adult PT Treatment/Exercise - 01/21/21 0001      Transfers   Transfers Sit to Stand;Stand to Sit    Sit to Stand 6: Modified independent (Device/Increase time)    Five time sit to stand comments  8.62 w/o UE support (3/10 dizziness).    Stand to Sit 6: Modified independent (Device/Increase time)      Exercises   Exercises Other Exercises    Other Exercises  Completed review of current HEP. Reviewed cervical rotation with towel, completed 2 x 30 seconds each. verbal cues for form.      Manual Therapy   Manual Therapy Soft tissue mobilization    Soft tissue  mobilization Completed STM to B Upper traps and B Rhombids x 10 minutes. Sustained pressure applied on trigger points to patient's tolerance. Continue to report tenderness with manual therapy but improvements noted after completion.           Vestibular Treatment/Exercise - 01/21/21 0001      Vestibular Treatment/Exercise   Vestibular Treatment Provided Gaze      X1 Viewing Horizontal   Foot Position standing feet apart    Reps 3    Comments x 30 seconds; completed without migraines glasses off and lights dimmed      X1 Viewing Vertical   Foot  Position standing feet apart    Reps 2    Comments x 30 seconds; completed without migraines glasses off and lights dimmed              Feet shoulder width apart, keeping eyes on target on wall 3-4 feet away, tilt head down 15-30 and move head side to side for 30 seconds. Repeat while moving head up and down for 30 seconds. Do 2-3 sessions per day.   Access Code: [email or web address] URL: [email or web address] Date: 12/31/2020 Prepared by: Jethro Bastos  Exercises Seated Assisted Cervical Rotation with Towel - 1 x daily - 5 x weekly - 1 sets - 3-4 reps - 10 hold Seated Head Turns Vestibular Habituation - 1 x daily - 5 x weekly - 3 sets - 5 reps     PT Education - 01/21/21 1406    Education Details educated on progress toward STGs; progress VOR x 1    Person(s) Educated Patient    Methods Explanation    Comprehension Verbalized understanding            PT Short Term Goals - 01/21/21 1411      PT SHORT TERM GOAL #1   Title Full vestibular assessment will be performed.    Baseline vestibular assesment performed    Time 4    Period Weeks    Status Achieved    Target Date 01/22/21      PT SHORT TERM GOAL #2   Title Pt will be independent in initial HEP for balance and vestibular exercises.    Baseline reports independence; completing 3-4x/week    Time 4    Period Weeks    Status Achieved    Target Date 01/22/21       PT SHORT TERM GOAL #3   Title Pt will report <3/10 pain in neck/head with activities for improved function.    Baseline 3/10 currently but increases with activities; reports continued 3-4/10 neck pain with quick movements or lifting objects    Time 4    Period Weeks    Status On-going    Target Date 01/22/21      PT SHORT TERM GOAL #4   Title DGI will be performed and LTG written.    Baseline 19/24 on 01/14/21    Time 4    Period Weeks    Status Achieved    Target Date 01/22/21      PT SHORT TERM GOAL #5   Title Pt will decrease 5 x sit to stand from 20 sec to <16 sec for improved balance.    Baseline 12/23/20 20 sec without hands from chair with 5/10 dizziness; 8.62 secs with dizziness 3/10    Time 4    Period Weeks    Status Achieved    Target Date 01/22/21             PT Long Term Goals - 01/14/21 1534      PT LONG TERM GOAL #1   Title Pt will be independent with progressive HEP for balance and vestibular exercises to continue gains on own.    Time 8    Period Weeks    Status New      PT LONG TERM GOAL #2   Title Pt will increase gait speed to >0.35m/s for improved gait safety.    Baseline 12/23/20 0.63m/s    Time 8    Period Weeks    Status New      PT LONG TERM GOAL #3  Title Pt will be able to perform functional activities with <3/10 dizziness for improved function.    Baseline currently varies but increases significantly with turning at all. 5/10 dizziness with 5 x sit to stand.    Time 8    Period Weeks    Status New      PT LONG TERM GOAL #4   Title Pt will ambulate >500' on varied surfaces independently for improved community mobility.    Time 8    Period Weeks    Status New      PT LONG TERM GOAL #5   Title Patient will improve DGI to >/= 22/24 to demonstrate improved balance and reduced fall risk    Baseline 19/24    Time 8    Period Weeks    Status Revised      PT LONG TERM GOAL #6   Title Pt will demonstrate 10 deg increase in pain  free cervical ROM to improve cervical proprioception and vestibular function.    Baseline see flow sheets    Time 8    Period Weeks                 Plan - 01/21/21 1635    Clinical Impression Statement Today's skilled PT session included assessment of patient's progress toward STGs. Patient able to meet STG #1, 2, 4, and 5 today during session. Patient continues to report neck pain with movements, but is having improvements with manual therapy and DN.  Reviewed HEP and progressed as tolerated. Rest of session focused on continued manual therapy to areas of increased muscle tension.    Personal Factors and Comorbidities Comorbidity 3+    Comorbidities PMH: abnormal thyroid function, ADHD, anxiety, arthritis, chronic kidney disease, DJD, fibromyalgia, tinnitus left ear    Examination-Activity Limitations Locomotion Level;Transfers;Stairs;Stand;Dressing;Bathing    Examination-Participation Restrictions Community Activity;Yard Work;Driving    Stability/Clinical Decision Making Evolving/Moderate complexity    Rehab Potential Good    PT Frequency 2x / week   plus eval   PT Duration 8 weeks    PT Treatment/Interventions ADLs/Self Care Home Management;Cryotherapy;Moist Heat;DME Instruction;Therapeutic activities;Functional mobility training;Stair training;Gait training;Therapeutic exercise;Balance training;Neuromuscular re-education;Canalith Repostioning;Patient/family education;Vestibular;Passive range of motion;Manual techniques;Spinal Manipulations;Dry needling    PT Next Visit Plan Continue to focus on neck ROM/strengthening/proprioception; vestibular HEP: x1 viewing in standing. habituation to motion sensitivity, standing balance with head/body turns.    Consulted and Agree with Plan of Care Patient           Patient will benefit from skilled therapeutic intervention in order to improve the following deficits and impairments:  Abnormal gait,Decreased balance,Decreased mobility,Decreased  strength,Decreased range of motion,Pain  Visit Diagnosis: Dizziness and giddiness  Cervicalgia  Other abnormalities of gait and mobility  Unsteadiness on feet     Problem List Patient Active Problem List   Diagnosis Date Noted  . 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

## 2021-01-25 ENCOUNTER — Ambulatory Visit: Payer: PPO | Admitting: Physical Therapy

## 2021-01-25 ENCOUNTER — Other Ambulatory Visit: Payer: Self-pay

## 2021-01-25 ENCOUNTER — Encounter: Payer: Self-pay | Admitting: Physical Therapy

## 2021-01-25 DIAGNOSIS — R42 Dizziness and giddiness: Secondary | ICD-10-CM

## 2021-01-25 DIAGNOSIS — R2689 Other abnormalities of gait and mobility: Secondary | ICD-10-CM | POA: Diagnosis not present

## 2021-01-25 DIAGNOSIS — M542 Cervicalgia: Secondary | ICD-10-CM

## 2021-01-25 NOTE — Therapy (Signed)
[facility] [address] [place], [place], [address] Phone: [phone]   Fax:  [phone]  Physical Therapy Treatment  Patient Details  Name: Wendy Joyce MRN: [id number] Date of Birth: [birth year] Referring Provider (PT): Asencion Partridge Dohmeier   Encounter Date: 01/25/2021   PT End of Session - 01/25/21 1655    Visit Number 9    Number of Visits 17    Date for PT Re-Evaluation 03/23/21   60 day poc, 90 day cert   Authorization Type HTA medicare so 10th visit progress note    Progress Note Due on Visit 10    PT Start Time 1406    PT Stop Time 1450    PT Time Calculation (min) 44 min    Equipment Utilized During Treatment Other (comment)   dry needles   Activity Tolerance Patient tolerated treatment well    Behavior During Therapy [facility] for tasks assessed/performed           Past Medical History:  Diagnosis Date  . Abdominal pain, generalized 01/29/2015  . Abnormal thyroid function test 02/25/2016  . ADHD (attention deficit hyperactivity disorder)   . Allergic rhinitis, unspecified 01/29/2015  . Ankle sprain 03/16/2016  . Anxiety 01/29/2015  . Arthritis    hands  . Chronic kidney disease 2012   kidney stones  . Constipation 01/29/2015  . Decreased libido 05/02/2018  . Disorder of the skin and subcutaneous tissue, unspecified 01/29/2015  . DJD (degenerative joint disease)    hand  . Dysthymia   . Endometriosis   . External otitis   . Family history of coronary artery disease 05/02/2018  . Fatigue due to excessive exertion 02/24/2016  . Fever blister   . Fibromyalgia 01/29/2015  . Frequency of micturition 01/29/2015  . Functional dyspepsia 01/29/2015  . Gastro-esophageal reflux disease without esophagitis 01/29/2015  . Headache(784.0)   . High cholesterol   . Hyperlipidemia 09/12/2016  . Hypersomnia   . Hypothyroidism   . IBS (irritable bowel syndrome) 05/02/2018  . Insomnia   . Internal  hemorrhoids   . 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 Labial pain 11/30/2016  . Lactose intolerance 12/14/2016  . Lactose intolerance in adult 09/12/2016  . 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 Left ureteral stone   . LP (lichen planus) 02/72/5366  . Major depressive disorder, single episode, unspecified   . Migraine with aura, not intractable, without status migrainosus   . Mixed hyperlipidemia   . Mouth pain 01/07/2016  . Osteoarthritis of hip   . Osteopenia determined by x-ray 03/02/2017  . Personal history of estrogen therapy   . Phlebitis and thrombophlebitis of the leg   . Pityriasis alba 01/29/2015  . Pure hypercholesterolemia 06/29/2016  . Rectum pain 09/12/2016  . Recurrent sinus infections 02/24/2016  . Rhinosinusitis 01/07/2016  . Rosacea   . Sinusitis 12/24/2015  . 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 Umbilical hernia without obstruction and without gangrene 01/29/2015  . Umbilical pain 44/02/4741  . Unspecified urinary incontinence 01/07/2016  . Urticaria   . UTI (urinary tract infection)   . 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 Vitamin D deficiency     Past Surgical History:  Procedure Laterality Date  . ABDOMINAL HYSTERECTOMY     partial  . BREAST ENHANCEMENT SURGERY    . BREAST REDUCTION SURGERY Bilateral 2018  . COLONOSCOPY WITH PROPOFOL N/A 02/25/2013   Procedure: COLONOSCOPY WITH PROPOFOL;  Surgeon: Garlan Fair,  MD;  Location: [facility];  Service: Endoscopy;  Laterality: N/A;  . mini facelift  2000  . NASAL SINUS SURGERY    . ROTATOR CUFF REPAIR     Right  . ROTATOR CUFF REPAIR     Left  . SEPTOPLASTY WITH ETHMOIDECTOMY, AND MAXILLARY ANTROSTOMY Bilateral 2006  . TOTAL ABDOMINAL HYSTERECTOMY    . TUBAL LIGATION    . umbicial hernia      There were no vitals filed for this visit.   Subjective Assessment - 01/25/21 1409    Subjective Went to [place] on Saturday and did  walking around the track with the rail with repeated head turns.  Went until she felt like she was going to pass out.  Had to take a rest day on Saturday due to soreness and HA.  Was pretty sore after dry needling but does notice some relief.    Pertinent History PMH: abnormal thyroid function, ADHD, anxiety, arthritis, chronic kidney disease, DJD, fibromyalgia, tinnitus left ear    Diagnostic tests MRI cervical 12/13/20:     IMPRESSION: This MRI of the cervical spine with and without contrast shows the following:  1.  Multilevel degenerative changes as detailed above that do not lead to spinal stenosis or nerve root compression.  2.  3 mm retrolisthesis of C5 upon C6 and 1 mm anterolisthesis of C6 upon C7.  3.  The spinal cord appears normal  4.   Normal enhancement pattern.        MRI brain 12/13/20:        IMPRESSION: This MRI of the brain with and without contrast shows the following:  1.   Mild generalized cortical atrophy, typical for age.  2.   Few punctate T2/FLAIR hyperintense foci in the hemispheres consistent with negligible chronic microvascular ischemic change.  3.   No acute findings.  4.   Normal enhancement pattern.    Patient Stated Goals Pt wants to move like she is 20 years younger feeling more confident moving around.    Currently in Pain? Yes    Pain Location Head    Pain Orientation Anterior    Pain Descriptors / Indicators Headache                             [facility] Adult PT Treatment/Exercise - 01/25/21 1649      Exercises   Exercises Shoulder      Shoulder Exercises: Prone   Retraction Strengthening;Both;5 reps    Retraction Limitations with cues for scapular depression and retraction to stabilize scapula on thoracic wall prior to lifting UE off mat; 5 second hold with cues to minimize compensatory use of upper trap      Manual Therapy   Manual Therapy Scapular mobilization    Manual therapy comments performed to L side after TDN due to pt with guarding  posture    Scapular Mobilization Performed in prone to L scapula: supported patient's humerus in one hand and provided facilitation to scapula with other.  Facilitated increased ROM within pain free range for scapular depression, upward/downwards rotation, protraction and retraction prior to performing scapular stability exercise            Trigger Point Dry Needling - 01/25/21 1412    Consent Given? Yes    Education Handout Provided Previously provided    Muscles Treated Head and Neck Upper trapezius    Muscles Treated Back/Hip Thoracic multifidi;Erector spinae    Dry  Needling Comments Performed in prone; performed thoracic muscles on R side only; performed upper trap to L and R side    Upper Trapezius Response Twitch reponse elicited;Palpable increased muscle length    Erector spinae Response Twitch response elicited;Palpable increased muscle length    Thoracic multifidi response Twitch response elicited;Palpable increased muscle length                PT Education - 01/25/21 1657    Education Details cautioned patient about overloading/overstimulating her brain/body by pushing past symptom threshold.  Advised pt when walking on track with head turns to allow mild symptoms (bump threshold) but not to push past that threshold due to post-exertional symptom exacerbation.  Encouraged pt that over time the threshold for symptoms would get higher and higher and would allow greater amounts of activity.    Person(s) Educated Patient    Methods Explanation    Comprehension Verbalized understanding            PT Short Term Goals - 01/21/21 1411      PT SHORT TERM GOAL #1   Title Full vestibular assessment will be performed.    Baseline vestibular assesment performed    Time 4    Period Weeks    Status Achieved    Target Date 01/22/21      PT SHORT TERM GOAL #2   Title Pt will be independent in initial HEP for balance and vestibular exercises.    Baseline reports independence;  completing 3-4x/week    Time 4    Period Weeks    Status Achieved    Target Date 01/22/21      PT SHORT TERM GOAL #3   Title Pt will report <3/10 pain in neck/head with activities for improved function.    Baseline 3/10 currently but increases with activities; reports continued 3-4/10 neck pain with quick movements or lifting objects    Time 4    Period Weeks    Status On-going    Target Date 01/22/21      PT SHORT TERM GOAL #4   Title DGI will be performed and LTG written.    Baseline 19/24 on 01/14/21    Time 4    Period Weeks    Status Achieved    Target Date 01/22/21      PT SHORT TERM GOAL #5   Title Pt will decrease 5 x sit to stand from 20 sec to <16 sec for improved balance.    Baseline 12/23/20 20 sec without hands from chair with 5/10 dizziness; 8.62 secs with dizziness 3/10    Time 4    Period Weeks    Status Achieved    Target Date 01/22/21             PT Long Term Goals - 01/25/21 1702      PT LONG TERM GOAL #1   Title Pt will be independent with progressive HEP for balance and vestibular exercises to continue gains on own.  (LTG due by 02/21/21)    Time 8    Period Weeks    Status New      PT LONG TERM GOAL #2   Title Pt will increase gait speed to >0.5m/s for improved gait safety.    Baseline 12/23/20 0.82m/s    Time 8    Period Weeks    Status New      PT LONG TERM GOAL #3   Title Pt will be able to perform functional activities with <  3/10 dizziness for improved function.    Baseline currently varies but increases significantly with turning at all. 5/10 dizziness with 5 x sit to stand.    Time 8    Period Weeks    Status New      PT LONG TERM GOAL #4   Title Pt will ambulate >500' on varied surfaces independently for improved community mobility.    Time 8    Period Weeks    Status New      PT LONG TERM GOAL #5   Title Patient will improve DGI to >/= 22/24 to demonstrate improved balance and reduced fall risk    Baseline 19/24    Time 8     Period Weeks    Status Revised      PT LONG TERM GOAL #6   Title Pt will demonstrate 10 deg increase in pain free cervical ROM to improve cervical proprioception and vestibular function.    Baseline see flow sheets    Time 8    Period Weeks                 Plan - 01/25/21 1655    Clinical Impression Statement Pt continued to report painful trigger point along R thoracic paraspinals; address ongoing myofascial trigger points and decreased ROM with trigger point dry needling to paraspinals and bilat upper trap.  Initiated scapular stabilization and thoracic extension training/strengthening.  Pt tolerated well but did report shoulder soreness after session.    Personal Factors and Comorbidities Comorbidity 3+    Comorbidities PMH: abnormal thyroid function, ADHD, anxiety, arthritis, chronic kidney disease, DJD, fibromyalgia, tinnitus left ear    Examination-Activity Limitations Locomotion Level;Transfers;Stairs;Stand;Dressing;Bathing    Examination-Participation Restrictions Community Activity;Yard Work;Driving    Stability/Clinical Decision Making Evolving/Moderate complexity    Rehab Potential Good    PT Frequency 2x / week   plus eval   PT Duration 8 weeks    PT Treatment/Interventions ADLs/Self Care Home Management;Cryotherapy;Moist Heat;DME Instruction;Therapeutic activities;Functional mobility training;Stair training;Gait training;Therapeutic exercise;Balance training;Neuromuscular re-education;Canalith Repostioning;Patient/family education;Vestibular;Passive range of motion;Manual techniques;Spinal Manipulations;Dry needling    PT Next Visit Plan 10th visit PN!!  Make sure when she is doing her walking program she isn't going way over symptom threshold. Continue to focus on neck ROM/strengthening/proprioception; vestibular HEP: x1 viewing in standing. habituation to motion sensitivity, standing balance with head/body turns.    Consulted and Agree with Plan of Care Patient            Patient will benefit from skilled therapeutic intervention in order to improve the following deficits and impairments:  Abnormal gait,Decreased balance,Decreased mobility,Decreased strength,Decreased range of motion,Pain  Visit Diagnosis: Dizziness and giddiness  Cervicalgia     Problem List Patient Active Problem List   Diagnosis Date Noted  . 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

## 2021-01-27 DIAGNOSIS — J309 Allergic rhinitis, unspecified: Secondary | ICD-10-CM | POA: Diagnosis not present

## 2021-01-27 DIAGNOSIS — R42 Dizziness and giddiness: Secondary | ICD-10-CM | POA: Diagnosis not present

## 2021-01-27 DIAGNOSIS — G2581 Restless legs syndrome: Secondary | ICD-10-CM | POA: Diagnosis not present

## 2021-01-27 DIAGNOSIS — K589 Irritable 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: Secondary | ICD-10-CM | POA: Diagnosis not present

## 2021-01-27 DIAGNOSIS — F411 Generalized anxiety disorder: Secondary | ICD-10-CM | POA: Diagnosis not present

## 2021-01-27 DIAGNOSIS — E039 Hypothyroidism, unspecified: Secondary | ICD-10-CM | POA: Diagnosis not present

## 2021-01-27 DIAGNOSIS — E559 Vitamin D deficiency, unspecified: Secondary | ICD-10-CM | POA: Diagnosis not present

## 2021-01-27 DIAGNOSIS — H9190 Unspecified hearing loss, unspecified ear: Secondary | ICD-10-CM | POA: Diagnosis not present

## 2021-01-27 DIAGNOSIS — H9319 Tinnitus, unspecified ear: Secondary | ICD-10-CM | POA: Diagnosis not present

## 2021-01-28 ENCOUNTER — Ambulatory Visit: Payer: PPO | Attending: Neurology

## 2021-01-28 ENCOUNTER — Other Ambulatory Visit: Payer: Self-pay

## 2021-01-28 DIAGNOSIS — R42 Dizziness and giddiness: Secondary | ICD-10-CM | POA: Diagnosis not present

## 2021-01-28 DIAGNOSIS — R2681 Unsteadiness on feet: Secondary | ICD-10-CM | POA: Diagnosis not present

## 2021-01-28 DIAGNOSIS — M542 Cervicalgia: Secondary | ICD-10-CM | POA: Insufficient documentation

## 2021-01-28 DIAGNOSIS — R2689 Other abnormalities of gait and mobility: Secondary | ICD-10-CM | POA: Diagnosis not present

## 2021-01-28 NOTE — Patient Instructions (Signed)
Shoulder (Scapula) Retraction    Pull shoulders back, squeezing shoulder blades together. Repeat 10 times per session. Do 2 sessions per day. Position: Seated  Copyright  VHI. All rights reserved.

## 2021-01-28 NOTE — Therapy (Signed)
[facility] [address] [place], [place], [address] Phone: [phone]   Fax:  [phone]  Physical Therapy Treatment/Progress Note   Patient Details  Name: Wendy Joyce MRN: [id number] Date of Birth: [birth year] Referring Provider (PT): Porfirio Mylar Dohmeier  Physical Therapy Progress Note   Dates of Reporting Period: 12/23/2020 - 01/28/2021  See Note below for Objective Data and Assessment of Progress/Goals.  Thank you for the referral of this patient. Adelfa Koh, PT, DPT   Encounter Date: 01/28/2021   PT End of Session - 01/28/21 1359    Visit Number 10    Number of Visits 17    Date for PT Re-Evaluation 03/23/21   60 day poc, 90 day cert   Authorization Type [employer] medicare so 10th visit progress note    Progress Note Due on Visit 10    PT Start Time 1400    PT Stop Time 1445    PT Time Calculation (min) 45 min    Activity Tolerance Patient tolerated treatment well    Behavior During Therapy [facility] for tasks assessed/performed           Past Medical History:  Diagnosis Date   Abdominal pain, generalized 01/29/2015   Abnormal thyroid function test 02/25/2016   ADHD (attention deficit hyperactivity disorder)    Allergic rhinitis, unspecified 01/29/2015   Ankle sprain 03/16/2016   Anxiety 01/29/2015   Arthritis    hands   Chronic kidney disease 2012   kidney stones   Constipation 01/29/2015   Decreased libido 05/02/2018   Disorder of the skin and subcutaneous tissue, unspecified 01/29/2015   DJD (degenerative joint disease)    hand   Dysthymia    Endometriosis    External otitis    Family history of coronary artery disease 05/02/2018   Fatigue due to excessive exertion 02/24/2016   Fever blister    Fibromyalgia 01/29/2015   Frequency of micturition 01/29/2015   Functional dyspepsia 01/29/2015   Gastro-esophageal reflux disease without esophagitis 01/29/2015    Headache(784.0)    High cholesterol    Hyperlipidemia 09/12/2016   Hypersomnia    Hypothyroidism    IBS (irritable bowel syndrome) 05/02/2018   Insomnia    Internal hemorrhoids    Irritable 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 01/29/2015   Labial pain 11/30/2016   Lactose intolerance 12/14/2016   Lactose intolerance in adult 09/12/2016   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   Left ureteral stone    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   Major depressive disorder, single episode, unspecified    Migraine with aura, not intractable, without status migrainosus    Mixed hyperlipidemia    Mouth pain 01/07/2016   Osteoarthritis of hip    Osteopenia determined by x-ray 03/02/2017   Personal history of estrogen therapy    Phlebitis and thrombophlebitis of the leg    Pityriasis alba 01/29/2015   Pure hypercholesterolemia 06/29/2016   Rectum pain 09/12/2016   Recurrent sinus infections 02/24/2016   Rhinosinusitis 01/07/2016   Rosacea    Sinusitis 12/24/2015   Tinnitus of left ear 01/29/2015   Umbilical hernia without obstruction and without gangrene 01/29/2015   Umbilical pain 05/02/2018   Unspecified urinary incontinence 01/07/2016   Urticaria    UTI (urinary tract infection)    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   Vitamin D deficiency     Past Surgical History:  Procedure Laterality Date   ABDOMINAL HYSTERECTOMY     partial   BREAST ENHANCEMENT  SURGERY     BREAST REDUCTION SURGERY Bilateral 2018   COLONOSCOPY WITH PROPOFOL N/A 02/25/2013   Procedure: COLONOSCOPY WITH PROPOFOL;  Surgeon: Garlan Fair, MD;  Location: [facility];  Service: Endoscopy;  Laterality: N/A;   mini facelift  2000   NASAL SINUS SURGERY     ROTATOR CUFF REPAIR     Right   ROTATOR CUFF REPAIR     Left   SEPTOPLASTY WITH ETHMOIDECTOMY, AND MAXILLARY ANTROSTOMY Bilateral 2006   TOTAL ABDOMINAL HYSTERECTOMY     TUBAL  LIGATION     umbicial hernia      There were no vitals filed for this visit.   Subjective Assessment - 01/28/21 1404    Subjective Patient reports was sore after the dry needlng last session, but reports it is worth it due to it is helping reduce some tension. The dry needling is giving her some relief. Reports HA upon walking in due to lighting has minor HA.    Pertinent History PMH: abnormal thyroid function, ADHD, anxiety, arthritis, chronic kidney disease, DJD, fibromyalgia, tinnitus left ear    Diagnostic tests MRI cervical 12/13/20:     IMPRESSION: This MRI of the cervical spine with and without contrast shows the following:  1.  Multilevel degenerative changes as detailed above that do not lead to spinal stenosis or nerve root compression.  2.  3 mm retrolisthesis of C5 upon C6 and 1 mm anterolisthesis of C6 upon C7.  3.  The spinal cord appears normal  4.   Normal enhancement pattern.        MRI brain 12/13/20:        IMPRESSION: This MRI of the brain with and without contrast shows the following:  1.   Mild generalized cortical atrophy, typical for age.  2.   Few punctate T2/FLAIR hyperintense foci in the hemispheres consistent with negligible chronic microvascular ischemic change.  3.   No acute findings.  4.   Normal enhancement pattern.    Patient Stated Goals Pt wants to move like she is 20 years younger feeling more confident moving around.    Currently in Pain? Yes    Pain Score 5     Pain Location Neck    Pain Orientation Right;Left    Pain Descriptors / Indicators Aching;Burning    Pain Type Chronic pain              [facility] PT Assessment - 01/28/21 0001      AROM   Overall AROM  Deficits    AROM Assessment Site Cervical    Cervical Flexion 51    Cervical Extension 52    Cervical - Right Side Bend 27    Cervical - Left Side Bend 28    Cervical - Right Rotation 61    Cervical - Left Rotation 60             [facility] Adult PT Treatment/Exercise - 01/28/21 0001       Ambulation/Gait   Ambulation/Gait Yes    Ambulation/Gait Assistance 5: Supervision    Ambulation/Gait Assistance Details slow cautious gait throughout session    Assistive device None    Gait Pattern Step-through pattern;Decreased step length - right;Decreased step length - left;Decreased arm swing - right;Decreased arm swing - left;Decreased trunk rotation    Ambulation Surface Level;Indoor      High Level Balance   High Level Balance Activities Head turns    High Level Balance Comments Completed ambulation in hallway with horizontal  head turns, completed 4 x 30'. Intermittent standing rest breaks required to allow for resolution of symptoms. PT continued to verbalize need for adequate rest breaks and stimulating the symptoms in a controlled manner.      Exercises   Exercises Other Exercises    Other Exercises  Completed seated cervical retraction x 10 reps, then completed x 10 reps in supine. Patient demo difficulty with form, cues required. With patient in seated positioned completed seated scapular retraction, completed 2 x 10 reps. PT providing handout as addition to HEP.           Vestibular Treatment/Exercise - 01/28/21 0001      Vestibular Treatment/Exercise   Vestibular Treatment Provided Gaze    Habituation Exercises Standing Horizontal Head Turns    Gaze Exercises X1 Viewing Horizontal      Standing Vertical Head Turns   Symptom Description  Completed standing horizontal head turns to alphabelt target, with patient verbalizing letter with completion, completed x 4 rows. Patient having increased dizziness after each row rated 3-4/10, and needing rest break to allow for resolution.      X1 Viewing Horizontal   Foot Position standing feet together    Reps 1    Comments x 45 seconds; increased sway and increased dizziness noted             PT Education - 01/28/21 1548    Education Details HEP Update (See patient instructions; scapular retraction)    Person(s) Educated  Patient    Methods Explanation;Handout;Demonstration    Comprehension Verbalized understanding;Returned demonstration         Shoulder (Scapula) Retraction    Pull shoulders back, squeezing shoulder blades together. Repeat 10 times per session. Do 2 sessions per day. Position: Seated   PT Short Term Goals - 01/21/21 1411      PT SHORT TERM GOAL #1   Title Full vestibular assessment will be performed.    Baseline vestibular assesment performed    Time 4    Period Weeks    Status Achieved    Target Date 01/22/21      PT SHORT TERM GOAL #2   Title Pt will be independent in initial HEP for balance and vestibular exercises.    Baseline reports independence; completing 3-4x/week    Time 4    Period Weeks    Status Achieved    Target Date 01/22/21      PT SHORT TERM GOAL #3   Title Pt will report <3/10 pain in neck/head with activities for improved function.    Baseline 3/10 currently but increases with activities; reports continued 3-4/10 neck pain with quick movements or lifting objects    Time 4    Period Weeks    Status On-going    Target Date 01/22/21      PT SHORT TERM GOAL #4   Title DGI will be performed and LTG written.    Baseline 19/24 on 01/14/21    Time 4    Period Weeks    Status Achieved    Target Date 01/22/21      PT SHORT TERM GOAL #5   Title Pt will decrease 5 x sit to stand from 20 sec to <16 sec for improved balance.    Baseline 12/23/20 20 sec without hands from chair with 5/10 dizziness; 8.62 secs with dizziness 3/10    Time 4    Period Weeks    Status Achieved    Target Date 01/22/21  PT Long Term Goals - 01/25/21 1702      PT LONG TERM GOAL #1   Title Pt will be independent with progressive HEP for balance and vestibular exercises to continue gains on own.  (LTG due by 02/21/21)    Time 8    Period Weeks    Status New      PT LONG TERM GOAL #2   Title Pt will increase gait speed to >0.75m/s for improved gait safety.     Baseline 12/23/20 0.38m/s    Time 8    Period Weeks    Status New      PT LONG TERM GOAL #3   Title Pt will be able to perform functional activities with <3/10 dizziness for improved function.    Baseline currently varies but increases significantly with turning at all. 5/10 dizziness with 5 x sit to stand.    Time 8    Period Weeks    Status New      PT LONG TERM GOAL #4   Title Pt will ambulate >500' on varied surfaces independently for improved community mobility.    Time 8    Period Weeks    Status New      PT LONG TERM GOAL #5   Title Patient will improve DGI to >/= 22/24 to demonstrate improved balance and reduced fall risk    Baseline 19/24    Time 8    Period Weeks    Status Revised      PT LONG TERM GOAL #6   Title Pt will demonstrate 10 deg increase in pain free cervical ROM to improve cervical proprioception and vestibular function.    Baseline see flow sheets    Time 8    Period Weeks                 Plan - 01/28/21 1400    Clinical Impression Statement Assessed patient's ROM with patient demonstrating improvements in all directions (see flowsheet for detailed measurements), and patient verbalizing reduced tension with PT services. Rest of session focused on continued exercises to promote scapular stabilization, and habituation and gaze stabilization. Patient is making slow steady progress and will continue to benefit from skilled PT services.    Personal Factors and Comorbidities Comorbidity 3+    Comorbidities PMH: abnormal thyroid function, ADHD, anxiety, arthritis, chronic kidney disease, DJD, fibromyalgia, tinnitus left ear    Examination-Activity Limitations Locomotion Level;Transfers;Stairs;Stand;Dressing;Bathing    Examination-Participation Restrictions Community Activity;Yard Work;Driving    Stability/Clinical Decision Making Evolving/Moderate complexity    Rehab Potential Good    PT Frequency 2x / week   plus eval   PT Duration 8 weeks    PT  Treatment/Interventions ADLs/Self Care Home Management;Cryotherapy;Moist Heat;DME Instruction;Therapeutic activities;Functional mobility training;Stair training;Gait training;Therapeutic exercise;Balance training;Neuromuscular re-education;Canalith Repostioning;Patient/family education;Vestibular;Passive range of motion;Manual techniques;Spinal Manipulations;Dry needling    PT Next Visit Plan How was addition of scapular retraction? Continue to focus on neck ROM/strengthening/proprioception; vestibular HEP: x1 viewing in standing. habituation to motion sensitivity, standing balance with head/body turns.    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 [place], [place], [address] Phone: [phone]   Fax:  [phone]  Name: Kiamesha Samet MRN: [id number] Date of Birth: [birth year]

## 2021-02-01 ENCOUNTER — Encounter: Payer: Self-pay | Admitting: Physical Therapy

## 2021-02-01 ENCOUNTER — Other Ambulatory Visit: Payer: Self-pay

## 2021-02-01 ENCOUNTER — Ambulatory Visit: Payer: PPO | Admitting: Physical Therapy

## 2021-02-01 DIAGNOSIS — R42 Dizziness and giddiness: Secondary | ICD-10-CM | POA: Diagnosis not present

## 2021-02-01 DIAGNOSIS — M542 Cervicalgia: Secondary | ICD-10-CM

## 2021-02-01 NOTE — Therapy (Signed)
[facility] [address] [place], [place], [address] Phone: [phone]   Fax:  [phone]  Physical Therapy Treatment  Patient Details  Name: Wendy Joyce MRN: [id number] Date of Birth: November 02, 1946 Referring Provider (PT): Asencion Partridge Dohmeier   Encounter Date: 02/01/2021   PT End of Session - 02/01/21 1651    Visit Number 11    Number of Visits 17    Date for PT Re-Evaluation 03/23/21   60 day poc, 90 day cert   Authorization Type HTA medicare so 10th visit progress note    Progress Note Due on Visit 20    PT Start Time 1400    PT Stop Time 1450    PT Time Calculation (min) 50 min    Activity Tolerance Patient tolerated treatment well    Behavior During Therapy [facility] for tasks assessed/performed           Past Medical History:  Diagnosis Date  . Abdominal pain, generalized 01/29/2015  . Abnormal thyroid function test 02/25/2016  . ADHD (attention deficit hyperactivity disorder)   . Allergic rhinitis, unspecified 01/29/2015  . Ankle sprain 03/16/2016  . Anxiety 01/29/2015  . Arthritis    hands  . Chronic kidney disease 2012   kidney stones  . Constipation 01/29/2015  . Decreased libido 05/02/2018  . Disorder of the skin and subcutaneous tissue, unspecified 01/29/2015  . DJD (degenerative joint disease)    hand  . Dysthymia   . Endometriosis   . External otitis   . Family history of coronary artery disease 05/02/2018  . Fatigue due to excessive exertion 02/24/2016  . Fever blister   . Fibromyalgia 01/29/2015  . Frequency of micturition 01/29/2015  . Functional dyspepsia 01/29/2015  . Gastro-esophageal reflux disease without esophagitis 01/29/2015  . Headache(784.0)   . High cholesterol   . Hyperlipidemia 09/12/2016  . Hypersomnia   . Hypothyroidism   . IBS (irritable bowel syndrome) 05/02/2018  . Insomnia   . Internal hemorrhoids   . 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  Labial pain 11/30/2016  . Lactose intolerance 12/14/2016  . Lactose intolerance in adult 09/12/2016  . 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 Left ureteral stone   . 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  . Major depressive disorder, single episode, unspecified   . Migraine with aura, not intractable, without status migrainosus   . Mixed hyperlipidemia   . Mouth pain 01/07/2016  . Osteoarthritis of hip   . Osteopenia determined by x-ray 03/02/2017  . Personal history of estrogen therapy   . Phlebitis and thrombophlebitis of the leg   . Pityriasis alba 01/29/2015  . Pure hypercholesterolemia 06/29/2016  . Rectum pain 09/12/2016  . Recurrent sinus infections 02/24/2016  . Rhinosinusitis 01/07/2016  . Rosacea   . Sinusitis 12/24/2015  . 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 Umbilical hernia without obstruction and without gangrene 01/29/2015  . Umbilical pain [id number]  . Unspecified urinary incontinence 01/07/2016  . Urticaria   . UTI (urinary tract infection)   . 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 Vitamin D deficiency     Past Surgical History:  Procedure Laterality Date  . ABDOMINAL HYSTERECTOMY     partial  . BREAST ENHANCEMENT SURGERY    . BREAST REDUCTION SURGERY Bilateral 2018  . COLONOSCOPY WITH PROPOFOL N/A 02/25/2013   Procedure: COLONOSCOPY WITH PROPOFOL;  Surgeon: Garlan Fair, MD;  Location: [facility];  Service: Endoscopy;  Laterality: N/A;  .  mini facelift  2000  . NASAL SINUS SURGERY    . ROTATOR CUFF REPAIR     Right  . ROTATOR CUFF REPAIR     Left  . SEPTOPLASTY WITH ETHMOIDECTOMY, AND MAXILLARY ANTROSTOMY Bilateral 2006  . TOTAL ABDOMINAL HYSTERECTOMY    . TUBAL LIGATION    . umbicial hernia      There were no vitals filed for this visit.   Subjective Assessment - 02/01/21 1403    Subjective Was sore through the weekend, still in thoracic region.  Went to chiropractor who put her on a tilt table and when  coming back up pt felt dizzy; not spinning but more wooziness and imbalance.    Pertinent History PMH: abnormal thyroid function, ADHD, anxiety, arthritis, chronic kidney disease, DJD, fibromyalgia, tinnitus left ear    Diagnostic tests MRI cervical 12/13/20:     IMPRESSION: This MRI of the cervical spine with and without contrast shows the following:  1.  Multilevel degenerative changes as detailed above that do not lead to spinal stenosis or nerve root compression.  2.  3 mm retrolisthesis of C5 upon C6 and 1 mm anterolisthesis of C6 upon C7.  3.  The spinal cord appears normal  4.   Normal enhancement pattern.        MRI brain 12/13/20:        IMPRESSION: This MRI of the brain with and without contrast shows the following:  1.   Mild generalized cortical atrophy, typical for age.  2.   Few punctate T2/FLAIR hyperintense foci in the hemispheres consistent with negligible chronic microvascular ischemic change.  3.   No acute findings.  4.   Normal enhancement pattern.    Patient Stated Goals Pt wants to move like she is 20 years younger feeling more confident moving around.    Currently in Pain? Yes                             [facility] Adult PT Treatment/Exercise - 02/01/21 1646      Manual Therapy   Manual Therapy Myofascial release;Joint mobilization;Manual Traction    Joint Mobilization After TDN.  With cervical spine in R or L rotation performed grade II upglides on contralateral side and downglides on ipsilateral side of upper, mid and lower cervical spine to facilitate increased rotation.  Greatest restriction noted at C3-C4.    Myofascial Release Suboccipital release after TDN x 5 minutes; educated pt on how to use tennis balls to reproduce effect at home    Manual Traction after TDN and after performing segmental mobilizations            Trigger Point Dry Needling - 02/01/21 1410    Consent Given? Yes    Education Handout Provided Previously provided    Muscles Treated  Head and Neck Upper trapezius;Suboccipitals    Muscles Treated Back/Hip Thoracic multifidi;Erector spinae    Dry Needling Comments Performed in prone; performed to L thoracic paraspinals and L upper trap.  Performed to suboccipitals bilaterally but greater tension noted on L.    Upper Trapezius Response Twitch reponse elicited;Palpable increased muscle length    Suboccipitals Response Twitch response elicited;Palpable increased muscle length    Erector spinae Response Palpable increased muscle length    Thoracic multifidi response Palpable increased muscle length                PT Education - 02/01/21 1651    Education Details  use of tennis balls for suboccipital release at home    Person(s) Educated Patient    Methods Explanation    Comprehension Verbalized understanding            PT Short Term Goals - 01/21/21 1411      PT SHORT TERM GOAL #1   Title Full vestibular assessment will be performed.    Baseline vestibular assesment performed    Time 4    Period Weeks    Status Achieved    Target Date 01/22/21      PT SHORT TERM GOAL #2   Title Pt will be independent in initial HEP for balance and vestibular exercises.    Baseline reports independence; completing 3-4x/week    Time 4    Period Weeks    Status Achieved    Target Date 01/22/21      PT SHORT TERM GOAL #3   Title Pt will report <3/10 pain in neck/head with activities for improved function.    Baseline 3/10 currently but increases with activities; reports continued 3-4/10 neck pain with quick movements or lifting objects    Time 4    Period Weeks    Status On-going    Target Date 01/22/21      PT SHORT TERM GOAL #4   Title DGI will be performed and LTG written.    Baseline 19/24 on 01/14/21    Time 4    Period Weeks    Status Achieved    Target Date 01/22/21      PT SHORT TERM GOAL #5   Title Pt will decrease 5 x sit to stand from 20 sec to <16 sec for improved balance.    Baseline 12/23/20 20 sec  without hands from chair with 5/10 dizziness; 8.62 secs with dizziness 3/10    Time 4    Period Weeks    Status Achieved    Target Date 01/22/21             PT Long Term Goals - 01/25/21 1702      PT LONG TERM GOAL #1   Title Pt will be independent with progressive HEP for balance and vestibular exercises to continue gains on own.  (LTG due by 02/21/21)    Time 8    Period Weeks    Status New      PT LONG TERM GOAL #2   Title Pt will increase gait speed to >0.51m/s for improved gait safety.    Baseline 12/23/20 0.57m/s    Time 8    Period Weeks    Status New      PT LONG TERM GOAL #3   Title Pt will be able to perform functional activities with <3/10 dizziness for improved function.    Baseline currently varies but increases significantly with turning at all. 5/10 dizziness with 5 x sit to stand.    Time 8    Period Weeks    Status New      PT LONG TERM GOAL #4   Title Pt will ambulate >500' on varied surfaces independently for improved community mobility.    Time 8    Period Weeks    Status New      PT LONG TERM GOAL #5   Title Patient will improve DGI to >/= 22/24 to demonstrate improved balance and reduced fall risk    Baseline 19/24    Time 8    Period Weeks    Status Revised  PT LONG TERM GOAL #6   Title Pt will demonstrate 10 deg increase in pain free cervical ROM to improve cervical proprioception and vestibular function.    Baseline see flow sheets    Time 8    Period Weeks                 Plan - 02/01/21 1651    Clinical Impression Statement Continued to utilize trigger point dry needling and manual therapy to improve patient's cervical spine ROM with increased focus on upper cervical spine mm tension and ROM for rotation.  Pt did not report any dizziness when lying prone or supine on mat today so did not address.  Will continue to monitor.    Personal Factors and Comorbidities Comorbidity 3+    Comorbidities PMH: abnormal thyroid function,  ADHD, anxiety, arthritis, chronic kidney disease, DJD, fibromyalgia, tinnitus left ear    Examination-Activity Limitations Locomotion Level;Transfers;Stairs;Stand;Dressing;Bathing    Examination-Participation Restrictions Community Activity;Yard Work;Driving    Stability/Clinical Decision Making Evolving/Moderate complexity    Rehab Potential Good    PT Frequency 2x / week   plus eval   PT Duration 8 weeks    PT Treatment/Interventions ADLs/Self Care Home Management;Cryotherapy;Moist Heat;DME Instruction;Therapeutic activities;Functional mobility training;Stair training;Gait training;Therapeutic exercise;Balance training;Neuromuscular re-education;Canalith Repostioning;Patient/family education;Vestibular;Passive range of motion;Manual techniques;Spinal Manipulations;Dry needling    PT Next Visit Plan Scapular retraction is going well; was she able to find two tennis balls to use for suboccipital release?   Focus on manual therapy to assist with rotation of neck, thoracic opener exercises.  Progress vestibular and balance.    Consulted and Agree with Plan of Care Patient           Patient will benefit from skilled therapeutic intervention in order to improve the following deficits and impairments:  Abnormal gait,Decreased balance,Decreased mobility,Decreased strength,Decreased range of motion,Pain  Visit Diagnosis: Dizziness and giddiness  Cervicalgia     Problem List Patient Active Problem List   Diagnosis Date Noted  . 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

## 2021-02-04 ENCOUNTER — Other Ambulatory Visit: Payer: Self-pay

## 2021-02-04 ENCOUNTER — Ambulatory Visit: Payer: PPO

## 2021-02-04 DIAGNOSIS — R2689 Other abnormalities of gait and mobility: Secondary | ICD-10-CM

## 2021-02-04 DIAGNOSIS — M542 Cervicalgia: Secondary | ICD-10-CM

## 2021-02-04 DIAGNOSIS — R42 Dizziness and giddiness: Secondary | ICD-10-CM | POA: Diagnosis not present

## 2021-02-04 DIAGNOSIS — R2681 Unsteadiness on feet: Secondary | ICD-10-CM

## 2021-02-04 NOTE — Therapy (Signed)
[facility] [address] [place], [place], [address] Phone: [phone]   Fax:  [phone]  Physical Therapy Treatment  Patient Details  Name: Wendy Joyce MRN: [id number] Date of Birth: 1946-06-30 Referring Provider (PT): Asencion Partridge Dohmeier   Encounter Date: 02/04/2021   PT End of Session - 02/04/21 1401    Visit Number 12    Number of Visits 17    Date for PT Re-Evaluation 03/23/21   60 day poc, 90 day cert   Authorization Type H[employer] medicare so 10th visit progress note    Progress Note Due on Visit 20    PT Start Time 1401    PT Stop Time 1446    PT Time Calculation (min) 45 min    Activity Tolerance Patient tolerated treatment well    Behavior During Therapy [facility] for tasks assessed/performed           Past Medical History:  Diagnosis Date  . Abdominal pain, generalized 01/29/2015  . Abnormal thyroid function test 02/25/2016  . ADHD (attention deficit hyperactivity disorder)   . Allergic rhinitis, unspecified 01/29/2015  . Ankle sprain 03/16/2016  . Anxiety 01/29/2015  . Arthritis    hands  . Chronic kidney disease 2012   kidney stones  . Constipation 01/29/2015  . Decreased libido 05/02/2018  . Disorder of the skin and subcutaneous tissue, unspecified 01/29/2015  . DJD (degenerative joint disease)    hand  . Dysthymia   . Endometriosis   . External otitis   . Family history of coronary artery disease 05/02/2018  . Fatigue due to excessive exertion 02/24/2016  . Fever blister   . Fibromyalgia 01/29/2015  . Frequency of micturition 01/29/2015  . Functional dyspepsia 01/29/2015  . Gastro-esophageal reflux disease without esophagitis 01/29/2015  . Headache(784.0)   . High cholesterol   . Hyperlipidemia 09/12/2016  . Hypersomnia   . Hypothyroidism   . IBS (irritable bowel syndrome) 05/02/2018  . Insomnia   . Internal hemorrhoids   . 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  Labial pain 11/30/2016  . Lactose intolerance 12/14/2016  . Lactose intolerance in adult 09/12/2016  . 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 Left ureteral stone   . LP (lichen planus) 32/67/1245  . Major depressive disorder, single episode, unspecified   . Migraine with aura, not intractable, without status migrainosus   . Mixed hyperlipidemia   . Mouth pain 01/07/2016  . Osteoarthritis of hip   . Osteopenia determined by x-ray 03/02/2017  . Personal history of estrogen therapy   . Phlebitis and thrombophlebitis of the leg   . Pityriasis alba 01/29/2015  . Pure hypercholesterolemia 06/29/2016  . Rectum pain 09/12/2016  . Recurrent sinus infections 02/24/2016  . Rhinosinusitis 01/07/2016  . Rosacea   . Sinusitis 12/24/2015  . 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 Umbilical hernia without obstruction and without gangrene 01/29/2015  . Umbilical pain 80/99/8338  . Unspecified urinary incontinence 01/07/2016  . Urticaria   . UTI (urinary tract infection)   . 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 Vitamin D deficiency     Past Surgical History:  Procedure Laterality Date  . ABDOMINAL HYSTERECTOMY     partial  . BREAST ENHANCEMENT SURGERY    . BREAST REDUCTION SURGERY Bilateral 2018  . COLONOSCOPY WITH PROPOFOL N/A 02/25/2013   Procedure: COLONOSCOPY WITH PROPOFOL;  Surgeon: Garlan Fair, MD;  Location: [facility];  Service: Endoscopy;  Laterality: N/A;  .  mini facelift  2000  . NASAL SINUS SURGERY    . ROTATOR CUFF REPAIR     Right  . ROTATOR CUFF REPAIR     Left  . SEPTOPLASTY WITH ETHMOIDECTOMY, AND MAXILLARY ANTROSTOMY Bilateral 2006  . TOTAL ABDOMINAL HYSTERECTOMY    . TUBAL LIGATION    . umbicial hernia      There were no vitals filed for this visit.   Subjective Assessment - 02/04/21 1404    Subjective Pateitn reports had soreness is gone by now. Reports she has been trying to do more aerobic exercise, and did not  have much dizziness. She forgot about the tennis ball exercise.    Pertinent History PMH: abnormal thyroid function, ADHD, anxiety, arthritis, chronic kidney disease, DJD, fibromyalgia, tinnitus left ear    Diagnostic tests MRI cervical 12/13/20:     IMPRESSION: This MRI of the cervical spine with and without contrast shows the following:  1.  Multilevel degenerative changes as detailed above that do not lead to spinal stenosis or nerve root compression.  2.  3 mm retrolisthesis of C5 upon C6 and 1 mm anterolisthesis of C6 upon C7.  3.  The spinal cord appears normal  4.   Normal enhancement pattern.        MRI brain 12/13/20:        IMPRESSION: This MRI of the brain with and without contrast shows the following:  1.   Mild generalized cortical atrophy, typical for age.  2.   Few punctate T2/FLAIR hyperintense foci in the hemispheres consistent with negligible chronic microvascular ischemic change.  3.   No acute findings.  4.   Normal enhancement pattern.    Patient Stated Goals Pt wants to move like she is 20 years younger feeling more confident moving around.    Currently in Pain? No/denies               [facility] Adult PT Treatment/Exercise - 02/04/21 0001      Exercises   Exercises Other Exercises    Other Exercises  With patient in quadruped completed Cat/Camel x 10 reps with PT providing tactile cues for improved motion. Attempted quadruped thoracic rotation with hand on head, Increased challenge with rotational movement in this position, completed x 5 reps to R. Transitioned to sidelying position and completed thoracic rotation with open book, completed x 10 reps each direction. Patient verbalizing improved relaxation with exercises/stretches. With patient in supine, completed thoracic extension mobilization with half foam roll, completed 2 x 10 reps, with PT providing cues fo rimproved thoracic extension with completion, and cue sfor inhale/exhale with movement to promote improved relaxation.  Also attempted seated SNAGs to thoracic region with towel, x 5 reps with patient verbalizing the foam roll felt more beneficial.      Manual Therapy   Manual Therapy Joint mobilization;Other (comment)    Joint Mobilization With patient in prone completed central and unilateral joint mobilizations to C6-T2, Grade 3, bouts of 30 seconds x 2 to each area.    Scapular Mobilization Performed scapular mobilization to L scapula. Patient in prone with humerus support, completed x 3 minute working toward improve scapular upward/downward rotation.    Other Manual Therapy With patient supine: PT completed passive Upper Trap stretch B 2 x 30 seconds to patient's tolerance, progressing into range.                    PT Short Term Goals - 01/21/21 1411  PT SHORT TERM GOAL #1   Title Full vestibular assessment will be performed.    Baseline vestibular assesment performed    Time 4    Period Weeks    Status Achieved    Target Date 01/22/21      PT SHORT TERM GOAL #2   Title Pt will be independent in initial HEP for balance and vestibular exercises.    Baseline reports independence; completing 3-4x/week    Time 4    Period Weeks    Status Achieved    Target Date 01/22/21      PT SHORT TERM GOAL #3   Title Pt will report <3/10 pain in neck/head with activities for improved function.    Baseline 3/10 currently but increases with activities; reports continued 3-4/10 neck pain with quick movements or lifting objects    Time 4    Period Weeks    Status On-going    Target Date 01/22/21      PT SHORT TERM GOAL #4   Title DGI will be performed and LTG written.    Baseline 19/24 on 01/14/21    Time 4    Period Weeks    Status Achieved    Target Date 01/22/21      PT SHORT TERM GOAL #5   Title Pt will decrease 5 x sit to stand from 20 sec to <16 sec for improved balance.    Baseline 12/23/20 20 sec without hands from chair with 5/10 dizziness; 8.62 secs with dizziness 3/10    Time 4     Period Weeks    Status Achieved    Target Date 01/22/21             PT Long Term Goals - 01/25/21 1702      PT LONG TERM GOAL #1   Title Pt will be independent with progressive HEP for balance and vestibular exercises to continue gains on own.  (LTG due by 02/21/21)    Time 8    Period Weeks    Status New      PT LONG TERM GOAL #2   Title Pt will increase gait speed to >0.20m/s for improved gait safety.    Baseline 12/23/20 0.32m/s    Time 8    Period Weeks    Status New      PT LONG TERM GOAL #3   Title Pt will be able to perform functional activities with <3/10 dizziness for improved function.    Baseline currently varies but increases significantly with turning at all. 5/10 dizziness with 5 x sit to stand.    Time 8    Period Weeks    Status New      PT LONG TERM GOAL #4   Title Pt will ambulate >500' on varied surfaces independently for improved community mobility.    Time 8    Period Weeks    Status New      PT LONG TERM GOAL #5   Title Patient will improve DGI to >/= 22/24 to demonstrate improved balance and reduced fall risk    Baseline 19/24    Time 8    Period Weeks    Status Revised      PT LONG TERM GOAL #6   Title Pt will demonstrate 10 deg increase in pain free cervical ROM to improve cervical proprioception and vestibular function.    Baseline see flow sheets    Time 8    Period Weeks  Plan - 02/04/21 1502    Clinical Impression Statement Continued manual therapy techniques working toward improved cervical and thoracic spine ROM and reduced muscle tension. Initiated exercises working towards improved thoracic mobility. Patient tolerating all exercises well, with improvements reported at end of session. No dizziness today, but did report mild HA, lights dim in private treatment room throughout session. Will continue to progress toward all LTGs.    Personal Factors and Comorbidities Comorbidity 3+    Comorbidities PMH: abnormal  thyroid function, ADHD, anxiety, arthritis, chronic kidney disease, DJD, fibromyalgia, tinnitus left ear    Examination-Activity Limitations Locomotion Level;Transfers;Stairs;Stand;Dressing;Bathing    Examination-Participation Restrictions Community Activity;Yard Work;Driving    Stability/Clinical Decision Making Evolving/Moderate complexity    Rehab Potential Good    PT Frequency 2x / week   plus eval   PT Duration 8 weeks    PT Treatment/Interventions ADLs/Self Care Home Management;Cryotherapy;Moist Heat;DME Instruction;Therapeutic activities;Functional mobility training;Stair training;Gait training;Therapeutic exercise;Balance training;Neuromuscular re-education;Canalith Repostioning;Patient/family education;Vestibular;Passive range of motion;Manual techniques;Spinal Manipulations;Dry needling    PT Next Visit Plan Scapular retraction is going well; was she able to find two tennis balls to use for suboccipital release? (forgot to complete this when asked at last session) Focus on manual therapy to assist with rotation of neck, thoracic opener exercises. Patient enjoyed with thoracic extension mobilziation with half foam roll, potentially complete and add to HEP (she has full foam roll at home?). also potential to add Cat/Camel to HEP. Progress vestibular and balance.    Consulted and Agree with Plan of Care Patient           Patient will benefit from skilled therapeutic intervention in order to improve the following deficits and impairments:  Abnormal gait,Decreased balance,Decreased mobility,Decreased strength,Decreased range of motion,Pain  Visit Diagnosis: Cervicalgia  Other abnormalities of gait and mobility  Unsteadiness on feet     Problem List Patient Active Problem List   Diagnosis Date Noted  . 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 [place], [place], [address] Phone: [phone]   Fax:  [phone]  Name: Wendy Joyce MRN: [id number] Date of Birth: 1946/02/16

## 2021-02-08 ENCOUNTER — Encounter: Payer: Self-pay | Admitting: Physical Therapy

## 2021-02-08 ENCOUNTER — Ambulatory Visit: Payer: PPO | Admitting: Physical Therapy

## 2021-02-08 ENCOUNTER — Other Ambulatory Visit: Payer: Self-pay

## 2021-02-08 DIAGNOSIS — R42 Dizziness and giddiness: Secondary | ICD-10-CM | POA: Diagnosis not present

## 2021-02-08 DIAGNOSIS — M542 Cervicalgia: Secondary | ICD-10-CM

## 2021-02-08 NOTE — Therapy (Signed)
[facility] [address] [place], [place], [address] Phone: [phone]   Fax:  [phone]  Physical Therapy Treatment  Patient Details  Name: Wendy Joyce MRN: [id number] Date of Birth: [birth year] Referring Provider (PT): Asencion Partridge Dohmeier   Encounter Date: 02/08/2021   PT End of Session - 02/08/21 1717    Visit Number 13    Number of Visits 17    Date for PT Re-Evaluation 03/23/21   60 day poc, 90 day cert   Authorization Type [employer] medicare so 10th visit progress note    Progress Note Due on Visit 20    PT Start Time 1403    PT Stop Time 1450    PT Time Calculation (min) 47 min    Activity Tolerance Patient tolerated treatment well    Behavior During Therapy [facility] for tasks assessed/performed           Past Medical History:  Diagnosis Date  . Abdominal pain, generalized 01/29/2015  . Abnormal thyroid function test 02/25/2016  . ADHD (attention deficit hyperactivity disorder)   . Allergic rhinitis, unspecified 01/29/2015  . Ankle sprain 03/16/2016  . Anxiety 01/29/2015  . Arthritis    hands  . Chronic kidney disease 2012   kidney stones  . Constipation 01/29/2015  . Decreased libido 05/02/2018  . Disorder of the skin and subcutaneous tissue, unspecified 01/29/2015  . DJD (degenerative joint disease)    hand  . Dysthymia   . Endometriosis   . External otitis   . Family history of coronary artery disease 05/02/2018  . Fatigue due to excessive exertion 02/24/2016  . Fever blister   . Fibromyalgia 01/29/2015  . Frequency of micturition 01/29/2015  . Functional dyspepsia 01/29/2015  . Gastro-esophageal reflux disease without esophagitis 01/29/2015  . Headache(784.0)   . High cholesterol   . Hyperlipidemia 09/12/2016  . Hypersomnia   . Hypothyroidism   . IBS (irritable bowel syndrome) 05/02/2018  . Insomnia   . Internal hemorrhoids   . 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  Labial pain 11/30/2016  . Lactose intolerance 12/14/2016  . Lactose intolerance in adult 09/12/2016  . 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 Left ureteral stone   . LP (lichen planus) 69/62/9528  . Major depressive disorder, single episode, unspecified   . Migraine with aura, not intractable, without status migrainosus   . Mixed hyperlipidemia   . Mouth pain 01/07/2016  . Osteoarthritis of hip   . Osteopenia determined by x-ray 03/02/2017  . Personal history of estrogen therapy   . Phlebitis and thrombophlebitis of the leg   . Pityriasis alba 01/29/2015  . Pure hypercholesterolemia 06/29/2016  . Rectum pain 09/12/2016  . Recurrent sinus infections 02/24/2016  . Rhinosinusitis 01/07/2016  . Rosacea   . Sinusitis 12/24/2015  . 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 Umbilical hernia without obstruction and without gangrene 01/29/2015  . Umbilical pain 41/32/4401  . Unspecified urinary incontinence 01/07/2016  . Urticaria   . UTI (urinary tract infection)   . 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 Vitamin D deficiency     Past Surgical History:  Procedure Laterality Date  . ABDOMINAL HYSTERECTOMY     partial  . BREAST ENHANCEMENT SURGERY    . BREAST REDUCTION SURGERY Bilateral 2018  . COLONOSCOPY WITH PROPOFOL N/A 02/25/2013   Procedure: COLONOSCOPY WITH PROPOFOL;  Surgeon: Garlan Fair, MD;  Location: [facility];  Service: Endoscopy;  Laterality: N/A;  .  mini facelift  2000  . NASAL SINUS SURGERY    . ROTATOR CUFF REPAIR     Right  . ROTATOR CUFF REPAIR     Left  . SEPTOPLASTY WITH ETHMOIDECTOMY, AND MAXILLARY ANTROSTOMY Bilateral 2006  . TOTAL ABDOMINAL HYSTERECTOMY    . TUBAL LIGATION    . umbicial hernia      There were no vitals filed for this visit.   Subjective Assessment - 02/08/21 1407    Subjective Felt good after last session; less sore than after dry needling.  Having fewer instances of dizziness; still  happens turning and getting out of the car (turning and standing up from the car).  May be the effort of opening the heavy door.  Mid back and suboccipital regions feel better; still having tension in shoulders.    Pertinent History PMH: abnormal thyroid function, ADHD, anxiety, arthritis, chronic kidney disease, DJD, fibromyalgia, tinnitus left ear    Diagnostic tests MRI cervical 12/13/20:     IMPRESSION: This MRI of the cervical spine with and without contrast shows the following:  1.  Multilevel degenerative changes as detailed above that do not lead to spinal stenosis or nerve root compression.  2.  3 mm retrolisthesis of C5 upon C6 and 1 mm anterolisthesis of C6 upon C7.  3.  The spinal cord appears normal  4.   Normal enhancement pattern.        MRI brain 12/13/20:        IMPRESSION: This MRI of the brain with and without contrast shows the following:  1.   Mild generalized cortical atrophy, typical for age.  2.   Few punctate T2/FLAIR hyperintense foci in the hemispheres consistent with negligible chronic microvascular ischemic change.  3.   No acute findings.  4.   Normal enhancement pattern.    Patient Stated Goals Pt wants to move like she is 20 years younger feeling more confident moving around.    Currently in Pain? No/denies                             [facility] Adult PT Treatment/Exercise - 02/08/21 1713      Therapeutic Activites    Therapeutic Activities Other Therapeutic Activities    Other Therapeutic Activities Educated pt on use of visualization, blocked practice and graded exposure for habituation to getting out of the car.  Advised pt to perform when car is parked at home and she isn't going anywhere specifically; advised pt to start with visualization and then progress to repeated transfers in/out of car.      Modalities   Modalities Moist Heat      Moist Heat Therapy   Number Minutes Moist Heat 10 Minutes    Moist Heat Location Shoulder;Cervical      Manual  Therapy   Manual Therapy Soft tissue mobilization;Myofascial release;Manual Traction;Passive ROM    Manual therapy comments Performed after TDN    Soft tissue mobilization Performed STM to R and L upper trap after TDN    Myofascial Release suboccipital release after TDN x 5 minutes    Passive ROM Passive ROM to R and L upper trap with therapist guiding patient through deep breathing and relaxation of upper trap muscles    Manual Traction after TDN, PROM and STM            Trigger Point Dry Needling - 02/08/21 1414    Consent Given? Yes    Education  Handout Provided Previously provided    Muscles Treated Head and Neck Upper trapezius    Dry Needling Comments Performed in supine.  Performed to R and L side.  Multiple cues to relax upper trapezius muscles prior to performing needling    Upper Trapezius Response Twitch reponse elicited;Palpable increased muscle length                PT Education - 02/08/21 1710    Education Details discussed how guarding when ambulating may be adding to shoulder tension; encouraged pt to ambulate with more arm swing and relaxed neck/shoulders.  Educated pt on use of visualization, blocked practice and graded exposure to decrease sensitivity to getting out of the car.    Person(s) Educated Patient    Methods Explanation    Comprehension Verbalized understanding            PT Short Term Goals - 01/21/21 1411      PT SHORT TERM GOAL #1   Title Full vestibular assessment will be performed.    Baseline vestibular assesment performed    Time 4    Period Weeks    Status Achieved    Target Date 01/22/21      PT SHORT TERM GOAL #2   Title Pt will be independent in initial HEP for balance and vestibular exercises.    Baseline reports independence; completing 3-4x/week    Time 4    Period Weeks    Status Achieved    Target Date 01/22/21      PT SHORT TERM GOAL #3   Title Pt will report <3/10 pain in neck/head with activities for improved  function.    Baseline 3/10 currently but increases with activities; reports continued 3-4/10 neck pain with quick movements or lifting objects    Time 4    Period Weeks    Status On-going    Target Date 01/22/21      PT SHORT TERM GOAL #4   Title DGI will be performed and LTG written.    Baseline 19/24 on 01/14/21    Time 4    Period Weeks    Status Achieved    Target Date 01/22/21      PT SHORT TERM GOAL #5   Title Pt will decrease 5 x sit to stand from 20 sec to <16 sec for improved balance.    Baseline 12/23/20 20 sec without hands from chair with 5/10 dizziness; 8.62 secs with dizziness 3/10    Time 4    Period Weeks    Status Achieved    Target Date 01/22/21             PT Long Term Goals - 01/25/21 1702      PT LONG TERM GOAL #1   Title Pt will be independent with progressive HEP for balance and vestibular exercises to continue gains on own.  (LTG due by 02/21/21)    Time 8    Period Weeks    Status New      PT LONG TERM GOAL #2   Title Pt will increase gait speed to >0.47m/s for improved gait safety.    Baseline 12/23/20 0.106m/s    Time 8    Period Weeks    Status New      PT LONG TERM GOAL #3   Title Pt will be able to perform functional activities with <3/10 dizziness for improved function.    Baseline currently varies but increases significantly with turning at all. 5/10 dizziness  with 5 x sit to stand.    Time 8    Period Weeks    Status New      PT LONG TERM GOAL #4   Title Pt will ambulate >500' on varied surfaces independently for improved community mobility.    Time 8    Period Weeks    Status New      PT LONG TERM GOAL #5   Title Patient will improve DGI to >/= 22/24 to demonstrate improved balance and reduced fall risk    Baseline 19/24    Time 8    Period Weeks    Status Revised      PT LONG TERM GOAL #6   Title Pt will demonstrate 10 deg increase in pain free cervical ROM to improve cervical proprioception and vestibular function.     Baseline see flow sheets    Time 8    Period Weeks                 Plan - 02/08/21 1717    Clinical Impression Statement Pt demonstrates benefit from combination of trigger point dry needling and manual therapy to address cervical and thoracic ROM and pain.  Pt is reporting decrease in pain, improved AROM and decreased dizziness overall.  Pt continues to ambulate and turn en bloc which may be contributing to upper trap tension.  Will continue to address with vestibular rehab.    Personal Factors and Comorbidities Comorbidity 3+    Comorbidities PMH: abnormal thyroid function, ADHD, anxiety, arthritis, chronic kidney disease, DJD, fibromyalgia, tinnitus left ear    Examination-Activity Limitations Locomotion Level;Transfers;Stairs;Stand;Dressing;Bathing    Examination-Participation Restrictions Community Activity;Yard Work;Driving    Stability/Clinical Decision Making Evolving/Moderate complexity    Rehab Potential Good    PT Frequency 2x / week   plus eval   PT Duration 8 weeks    PT Treatment/Interventions ADLs/Self Care Home Management;Cryotherapy;Moist Heat;DME Instruction;Therapeutic activities;Functional mobility training;Stair training;Gait training;Therapeutic exercise;Balance training;Neuromuscular re-education;Canalith Repostioning;Patient/family education;Vestibular;Passive range of motion;Manual techniques;Spinal Manipulations;Dry needling    PT Next Visit Plan thoracic extension mobilziation with half foam roll, potentially complete and add to HEP (she has full foam roll at home?). also potential to add Cat/Camel to HEP.  For vestibular - still getting dizzy with turning and when first getting out of the car-continue habituation.  Please let me know if she needs any further TDN.  LTG due next week.    Consulted and Agree with Plan of Care Patient           Patient will benefit from skilled therapeutic intervention in order to improve the following deficits and impairments:   Abnormal gait,Decreased balance,Decreased mobility,Decreased strength,Decreased range of motion,Pain  Visit Diagnosis: Cervicalgia  Dizziness and giddiness     Problem List Patient Active Problem List   Diagnosis Date Noted  . 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

## 2021-02-11 ENCOUNTER — Ambulatory Visit: Payer: PPO

## 2021-02-11 ENCOUNTER — Other Ambulatory Visit: Payer: Self-pay

## 2021-02-11 DIAGNOSIS — R2681 Unsteadiness on feet: Secondary | ICD-10-CM

## 2021-02-11 DIAGNOSIS — M542 Cervicalgia: Secondary | ICD-10-CM

## 2021-02-11 DIAGNOSIS — R42 Dizziness and giddiness: Secondary | ICD-10-CM | POA: Diagnosis not present

## 2021-02-11 DIAGNOSIS — R2689 Other abnormalities of gait and mobility: Secondary | ICD-10-CM

## 2021-02-11 NOTE — Therapy (Signed)
[facility] [address] [place], [place], [address] Phone: [phone]   Fax:  [phone]  Physical Therapy Treatment  Patient Details  Name: Altagracia Rone MRN: [id number] Date of Birth: 24-Feb-1946 Referring Provider (PT): Asencion Partridge Dohmeier   Encounter Date: 02/11/2021   PT End of Session - 02/11/21 1400    Visit Number 14    Number of Visits 17    Date for PT Re-Evaluation 03/23/21   60 day poc, 90 day cert   Authorization Type [employer] medicare so 10th visit progress note    Progress Note Due on Visit 20    PT Start Time 1400    PT Stop Time 1445    PT Time Calculation (min) 45 min    Activity Tolerance Patient tolerated treatment well    Behavior During Therapy [facility] for tasks assessed/performed           Past Medical History:  Diagnosis Date  . Abdominal pain, generalized 01/29/2015  . Abnormal thyroid function test 02/25/2016  . ADHD (attention deficit hyperactivity disorder)   . Allergic rhinitis, unspecified 01/29/2015  . Ankle sprain 03/16/2016  . Anxiety 01/29/2015  . Arthritis    hands  . Chronic kidney disease 2012   kidney stones  . Constipation 01/29/2015  . Decreased libido 05/02/2018  . Disorder of the skin and subcutaneous tissue, unspecified 01/29/2015  . DJD (degenerative joint disease)    hand  . Dysthymia   . Endometriosis   . External otitis   . Family history of coronary artery disease 05/02/2018  . Fatigue due to excessive exertion 02/24/2016  . Fever blister   . Fibromyalgia 01/29/2015  . Frequency of micturition 01/29/2015  . Functional dyspepsia 01/29/2015  . Gastro-esophageal reflux disease without esophagitis 01/29/2015  . Headache(784.0)   . High cholesterol   . Hyperlipidemia 09/12/2016  . Hypersomnia   . Hypothyroidism   . IBS (irritable bowel syndrome) 05/02/2018  . Insomnia   . Internal hemorrhoids   . 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  Labial pain 11/30/2016  . Lactose intolerance 12/14/2016  . Lactose intolerance in adult 09/12/2016  . 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 Left ureteral stone   . LP (lichen planus) 53/64/6803  . Major depressive disorder, single episode, unspecified   . Migraine with aura, not intractable, without status migrainosus   . Mixed hyperlipidemia   . Mouth pain 01/07/2016  . Osteoarthritis of hip   . Osteopenia determined by x-ray 03/02/2017  . Personal history of estrogen therapy   . Phlebitis and thrombophlebitis of the leg   . Pityriasis alba 01/29/2015  . Pure hypercholesterolemia 06/29/2016  . Rectum pain 09/12/2016  . Recurrent sinus infections 02/24/2016  . Rhinosinusitis 01/07/2016  . Rosacea   . Sinusitis 12/24/2015  . 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 Umbilical hernia without obstruction and without gangrene 01/29/2015  . Umbilical pain 21/22/4825  . Unspecified urinary incontinence 01/07/2016  . Urticaria   . UTI (urinary tract infection)   . 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 Vitamin D deficiency     Past Surgical History:  Procedure Laterality Date  . ABDOMINAL HYSTERECTOMY     partial  . BREAST ENHANCEMENT SURGERY    . BREAST REDUCTION SURGERY Bilateral 2018  . COLONOSCOPY WITH PROPOFOL N/A 02/25/2013   Procedure: COLONOSCOPY WITH PROPOFOL;  Surgeon: Garlan Fair, MD;  Location: [facility];  Service: Endoscopy;  Laterality: N/A;  .  mini facelift  2000  . NASAL SINUS SURGERY    . ROTATOR CUFF REPAIR     Right  . ROTATOR CUFF REPAIR     Left  . SEPTOPLASTY WITH ETHMOIDECTOMY, AND MAXILLARY ANTROSTOMY Bilateral 2006  . TOTAL ABDOMINAL HYSTERECTOMY    . TUBAL LIGATION    . umbicial hernia      There were no vitals filed for this visit.   Subjective Assessment - 02/11/21 1402    Subjective Continues to have some soreness after dry needling. Patient reports that her glasses, she really believes this is  helping.    Pertinent History PMH: abnormal thyroid function, ADHD, anxiety, arthritis, chronic kidney disease, DJD, fibromyalgia, tinnitus left ear    Diagnostic tests MRI cervical 12/13/20:     IMPRESSION: This MRI of the cervical spine with and without contrast shows the following:  1.  Multilevel degenerative changes as detailed above that do not lead to spinal stenosis or nerve root compression.  2.  3 mm retrolisthesis of C5 upon C6 and 1 mm anterolisthesis of C6 upon C7.  3.  The spinal cord appears normal  4.   Normal enhancement pattern.        MRI brain 12/13/20:        IMPRESSION: This MRI of the brain with and without contrast shows the following:  1.   Mild generalized cortical atrophy, typical for age.  2.   Few punctate T2/FLAIR hyperintense foci in the hemispheres consistent with negligible chronic microvascular ischemic change.  3.   No acute findings.  4.   Normal enhancement pattern.    Patient Stated Goals Pt wants to move like she is 20 years younger feeling more confident moving around.    Currently in Pain? Yes    Pain Score 4     Pain Location Neck    Pain Orientation Left;Right;Posterior    Pain Descriptors / Indicators Sore    Pain Type Chronic pain              [facility] PT Assessment - 02/11/21 0001      AROM   Cervical - Right Rotation 64   seated; supine: 65   Cervical - Left Rotation 65   supine; 67 seated             [facility] Adult PT Treatment/Exercise - 02/11/21 0001      Ambulation/Gait   Ambulation/Gait Yes    Ambulation/Gait Assistance 5: Supervision    Ambulation/Gait Assistance Details completed ambulation x 115 ft with PT providing verbal cues for relaxation through shoulders and arm swing. Completed ambulation x 230 ft with walking sticks in BUE to promtoe improved reciprocal arm swing and relaxation, intermittent verbal cues required. Improved carryover noted throughout session with arm swing.    Ambulation Distance (Feet) 345 Feet    Assistive  device None    Gait Pattern Step-through pattern;Decreased step length - right;Decreased step length - left;Decreased arm swing - right;Decreased arm swing - left;Decreased trunk rotation      Exercises   Exercises Other Exercises    Other Exercises  With patient in quadruped completed Cat/Camel x 10 reps with PT providing tactile cues for improved motion, addition of deep breathign iwth motion to promote relaxation. completed thoracic rotation with open book, completed x 10 reps iin R Sidelying, and x 10 reps in L sidelying. With patient in supine, completed thoracic extension mobilization with full foam roll, completed 1 x 15 reps with 5 second hold (and  deep breathing) tp promtoe relaxation.  Educated on proper completion at home and added to HEP.      Neck Exercises: Seated   Shoulder Rolls Backwards;10 reps;Limitations    Shoulder Rolls Limitations completed x 10 reps with addition of deep breathing during completion, PT providing overpressure with relaxation to promote improved stretch.      Manual Therapy   Manual Therapy Soft tissue mobilization    Soft tissue mobilization Performed STM to R and L upper trap after activites to promote reduced muscle tension, compelted x 5 minutes.            Access Code: [email or web address] URL: [email or web address] Date: 02/11/2021 Prepared by: Baldomero Lamy  Exercises Seated Assisted Cervical Rotation with Towel - 1 x daily - 5 x weekly - 1 sets - 3-4 reps - 10 hold Seated Left Head Turns Vestibular Habituation - 1 x daily - 5 x weekly - 3 sets - 5 reps Thoracic Mobilization on Foam Roll - Hands Clasped - 1 x daily - 5 x weekly - 1 sets - 10 reps - 5 hold Cat Cow - 1 x daily - 5 x weekly - 1 sets - 10 reps - 5 hold Sidelying Thoracic Rotation with Open Book - 1 x daily - 5 x weekly - 1 sets - 10 reps - 5 hold        PT Education - 02/11/21 1456    Education Details HEP update (See patient instructions)    Person(s) Educated  Patient    Methods Explanation;Handout;Demonstration    Comprehension Verbalized understanding;Returned demonstration            PT Short Term Goals - 01/21/21 1411      PT SHORT TERM GOAL #1   Title Full vestibular assessment will be performed.    Baseline vestibular assesment performed    Time 4    Period Weeks    Status Achieved    Target Date 01/22/21      PT SHORT TERM GOAL #2   Title Pt will be independent in initial HEP for balance and vestibular exercises.    Baseline reports independence; completing 3-4x/week    Time 4    Period Weeks    Status Achieved    Target Date 01/22/21      PT SHORT TERM GOAL #3   Title Pt will report <3/10 pain in neck/head with activities for improved function.    Baseline 3/10 currently but increases with activities; reports continued 3-4/10 neck pain with quick movements or lifting objects    Time 4    Period Weeks    Status On-going    Target Date 01/22/21      PT SHORT TERM GOAL #4   Title DGI will be performed and LTG written.    Baseline 19/24 on 01/14/21    Time 4    Period Weeks    Status Achieved    Target Date 01/22/21      PT SHORT TERM GOAL #5   Title Pt will decrease 5 x sit to stand from 20 sec to <16 sec for improved balance.    Baseline 12/23/20 20 sec without hands from chair with 5/10 dizziness; 8.62 secs with dizziness 3/10    Time 4    Period Weeks    Status Achieved    Target Date 01/22/21             PT Long Term Goals - 01/25/21 1702      PT LONG  TERM GOAL #1   Title Pt will be independent with progressive HEP for balance and vestibular exercises to continue gains on own.  (LTG due by 02/21/21)    Time 8    Period Weeks    Status New      PT LONG TERM GOAL #2   Title Pt will increase gait speed to >0.25m/s for improved gait safety.    Baseline 12/23/20 0.63m/s    Time 8    Period Weeks    Status New      PT LONG TERM GOAL #3   Title Pt will be able to perform functional activities with <3/10  dizziness for improved function.    Baseline currently varies but increases significantly with turning at all. 5/10 dizziness with 5 x sit to stand.    Time 8    Period Weeks    Status New      PT LONG TERM GOAL #4   Title Pt will ambulate >500' on varied surfaces independently for improved community mobility.    Time 8    Period Weeks    Status New      PT LONG TERM GOAL #5   Title Patient will improve DGI to >/= 22/24 to demonstrate improved balance and reduced fall risk    Baseline 19/24    Time 8    Period Weeks    Status Revised      PT LONG TERM GOAL #6   Title Pt will demonstrate 10 deg increase in pain free cervical ROM to improve cervical proprioception and vestibular function.    Baseline see flow sheets    Time 8    Period Weeks                 Plan - 02/11/21 1457    Clinical Impression Statement Completed gait training iwth walking sticks to promote improved arm swing with carryover noted immediately during session. Continued manual therapy and therapeutic activites working toward improved cervical/thoracic AROM. Patient tolerating all activities well, dmeonstrating imrpoed pain and relaxation at end of session. Will continue to progress toward all LTGs.    Personal Factors and Comorbidities Comorbidity 3+    Comorbidities PMH: abnormal thyroid function, ADHD, anxiety, arthritis, chronic kidney disease, DJD, fibromyalgia, tinnitus left ear    Examination-Activity Limitations Locomotion Level;Transfers;Stairs;Stand;Dressing;Bathing    Examination-Participation Restrictions Community Activity;Yard Work;Driving    Stability/Clinical Decision Making Evolving/Moderate complexity    Rehab Potential Good    PT Frequency 2x / week   plus eval   PT Duration 8 weeks    PT Treatment/Interventions ADLs/Self Care Home Management;Cryotherapy;Moist Heat;DME Instruction;Therapeutic activities;Functional mobility training;Stair training;Gait training;Therapeutic  exercise;Balance training;Neuromuscular re-education;Canalith Repostioning;Patient/family education;Vestibular;Passive range of motion;Manual techniques;Spinal Manipulations;Dry needling    PT Next Visit Plan How was HEP additions?  For vestibular - still getting dizzy with turning and when first getting out of the car-continue habituation.  Please let me know if she needs any further TDN.  LTG due next week.    Consulted and Agree with Plan of Care Patient           Patient will benefit from skilled therapeutic intervention in order to improve the following deficits and impairments:  Abnormal gait,Decreased balance,Decreased mobility,Decreased strength,Decreased range of motion,Pain  Visit Diagnosis: Cervicalgia  Dizziness and giddiness  Other abnormalities of gait and mobility  Unsteadiness on feet     Problem List Patient Active Problem List   Diagnosis Date Noted  . 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

## 2021-02-11 NOTE — Patient Instructions (Signed)
Access Code: [email or web address] URL: [email or web address] Date: 02/11/2021 Prepared by: Baldomero Lamy  Exercises Seated Assisted Cervical Rotation with Towel - 1 x daily - 5 x weekly - 1 sets - 3-4 reps - 10 hold Seated Left Head Turns Vestibular Habituation - 1 x daily - 5 x weekly - 3 sets - 5 reps Thoracic Mobilization on Foam Roll - Hands Clasped - 1 x daily - 5 x weekly - 1 sets - 10 reps - 5 hold Cat Cow - 1 x daily - 5 x weekly - 1 sets - 10 reps - 5 hold Sidelying Thoracic Rotation with Open Book - 1 x daily - 5 x weekly - 1 sets - 10 reps - 5 hold

## 2021-02-15 ENCOUNTER — Other Ambulatory Visit: Payer: Self-pay

## 2021-02-15 ENCOUNTER — Ambulatory Visit: Payer: PPO

## 2021-02-15 DIAGNOSIS — R2689 Other abnormalities of gait and mobility: Secondary | ICD-10-CM

## 2021-02-15 DIAGNOSIS — R42 Dizziness and giddiness: Secondary | ICD-10-CM

## 2021-02-15 DIAGNOSIS — M542 Cervicalgia: Secondary | ICD-10-CM

## 2021-02-15 DIAGNOSIS — R2681 Unsteadiness on feet: Secondary | ICD-10-CM

## 2021-02-15 NOTE — Therapy (Signed)
[facility] [address] [place], [place], [address] Phone: [phone]   Fax:  [phone]  Physical Therapy Treatment  Patient Details  Name: Wendy Joyce MRN: [id number] Date of Birth: Jan 25, 1946 Referring Provider (PT): Asencion Partridge Dohmeier   Encounter Date: 02/15/2021   PT End of Session - 02/15/21 1358    Visit Number 15    Number of Visits 17    Date for PT Re-Evaluation 03/23/21   60 day poc, 90 day cert   Authorization Type [employer] medicare so 10th visit progress note    Progress Note Due on Visit 20    PT Start Time 1359    PT Stop Time 1447    PT Time Calculation (min) 48 min    Activity Tolerance Patient tolerated treatment well    Behavior During Therapy [facility] for tasks assessed/performed           Past Medical History:  Diagnosis Date  . Abdominal pain, generalized 01/29/2015  . Abnormal thyroid function test 02/25/2016  . ADHD (attention deficit hyperactivity disorder)   . Allergic rhinitis, unspecified 01/29/2015  . Ankle sprain 03/16/2016  . Anxiety 01/29/2015  . Arthritis    hands  . Chronic kidney disease 2012   kidney stones  . Constipation 01/29/2015  . Decreased libido 05/02/2018  . Disorder of the skin and subcutaneous tissue, unspecified 01/29/2015  . DJD (degenerative joint disease)    hand  . Dysthymia   . Endometriosis   . External otitis   . Family history of coronary artery disease 05/02/2018  . Fatigue due to excessive exertion 02/24/2016  . Fever blister   . Fibromyalgia 01/29/2015  . Frequency of micturition 01/29/2015  . Functional dyspepsia 01/29/2015  . Gastro-esophageal reflux disease without esophagitis 01/29/2015  . Headache(784.0)   . High cholesterol   . Hyperlipidemia 09/12/2016  . Hypersomnia   . Hypothyroidism   . IBS (irritable bowel syndrome) 05/02/2018  . Insomnia   . Internal hemorrhoids   . 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  Labial pain 11/30/2016  . Lactose intolerance 12/14/2016  . Lactose intolerance in adult 09/12/2016  . 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 Left ureteral stone   . LP (lichen planus) 94/70/9628  . Major depressive disorder, single episode, unspecified   . Migraine with aura, not intractable, without status migrainosus   . Mixed hyperlipidemia   . Mouth pain 01/07/2016  . Osteoarthritis of hip   . Osteopenia determined by x-ray 03/02/2017  . Personal history of estrogen therapy   . Phlebitis and thrombophlebitis of the leg   . Pityriasis alba 01/29/2015  . Pure hypercholesterolemia 06/29/2016  . Rectum pain 09/12/2016  . Recurrent sinus infections 02/24/2016  . Rhinosinusitis 01/07/2016  . Rosacea   . Sinusitis 12/24/2015  . 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 Umbilical hernia without obstruction and without gangrene 01/29/2015  . Umbilical pain 36/62/9476  . Unspecified urinary incontinence 01/07/2016  . Urticaria   . UTI (urinary tract infection)   . 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 Vitamin D deficiency     Past Surgical History:  Procedure Laterality Date  . ABDOMINAL HYSTERECTOMY     partial  . BREAST ENHANCEMENT SURGERY    . BREAST REDUCTION SURGERY Bilateral 2018  . COLONOSCOPY WITH PROPOFOL N/A 02/25/2013   Procedure: COLONOSCOPY WITH PROPOFOL;  Surgeon: Garlan Fair, MD;  Location: [facility];  Service: Endoscopy;  Laterality: N/A;  .  mini facelift  2000  . NASAL SINUS SURGERY    . ROTATOR CUFF REPAIR     Right  . ROTATOR CUFF REPAIR     Left  . SEPTOPLASTY WITH ETHMOIDECTOMY, AND MAXILLARY ANTROSTOMY Bilateral 2006  . TOTAL ABDOMINAL HYSTERECTOMY    . TUBAL LIGATION    . umbicial hernia      There were no vitals filed for this visit.   Subjective Assessment - 02/15/21 1358    Subjective Patient reports that she had a busy weekend. Reports that dizziness has been rare, just a couple things have set it  off. Patietn reports still some soreness/stiff in neck. Reports continues to try to relax to help tension in the neck.    Pertinent History PMH: abnormal thyroid function, ADHD, anxiety, arthritis, chronic kidney disease, DJD, fibromyalgia, tinnitus left ear    Diagnostic tests MRI cervical 12/13/20:     IMPRESSION: This MRI of the cervical spine with and without contrast shows the following:  1.  Multilevel degenerative changes as detailed above that do not lead to spinal stenosis or nerve root compression.  2.  3 mm retrolisthesis of C5 upon C6 and 1 mm anterolisthesis of C6 upon C7.  3.  The spinal cord appears normal  4.   Normal enhancement pattern.        MRI brain 12/13/20:        IMPRESSION: This MRI of the brain with and without contrast shows the following:  1.   Mild generalized cortical atrophy, typical for age.  2.   Few punctate T2/FLAIR hyperintense foci in the hemispheres consistent with negligible chronic microvascular ischemic change.  3.   No acute findings.  4.   Normal enhancement pattern.    Patient Stated Goals Pt wants to move like she is 20 years younger feeling more confident moving around.    Currently in Pain? Yes    Pain Score 3     Pain Location Neck    Pain Orientation Left;Posterior    Pain Descriptors / Indicators Tightness;Other (Comment)   Tension   Pain Onset More than a month ago    Pain Frequency Intermittent               [facility] Adult PT Treatment/Exercise - 02/15/21 0001      Ambulation/Gait   Ambulation/Gait Yes    Ambulation/Gait Assistance 6: Modified independent (Device/Increase time)    Ambulation/Gait Assistance Details completed ambulation outdoors on unlevel various surfaces including pavement/grass/gravel x 1200 ft with no instances of imbalance. no increase in dizziness. Sun glasses worn for light senstivity.    Ambulation Distance (Feet) 1200 Feet    Assistive device None    Gait Pattern Step-through pattern;Decreased step length -  right;Decreased step length - left;Decreased arm swing - right;Decreased arm swing - left;Decreased trunk rotation    Ambulation Surface Unlevel;Outdoor      Self-Care   Self-Care Other Self-Care Comments    Other Self-Care Comments  PT educating on use of tennis ball with self massage. PT demonstrating to patient, and then patient returned demonstration to ensure proper leanring/completion.      Manual Therapy   Manual Therapy Soft tissue mobilization;Myofascial release    Soft tissue mobilization Performed STM to R and L upper trap, R and L rhomboids with patient promote reduced muscle tension, compelted x 5 minutes. Prolonged pressure applied to trigger point noted on L Rhomboid.    Myofascial Release completed suboccipital release x 3 minutes.  Other Manual Therapy With patient supine: PT completed passive Upper Trap stretch B 2 x 30 seconds to patient's tolerance, progressing into range as tolerated.           Vestibular Treatment/Exercise - 02/15/21 0001      Vestibular Treatment/Exercise   Vestibular Treatment Provided Habituation    Habituation Exercises Standing Horizontal Head Turns      Standing Horizontal Head Turns   Number of Reps  2    Symptom Description  with numbered card, completed hroizontal head turns reading off number x 3 lines. No increase in dizziness. mild reports of blurry vision with looking to R. Progressed to stroop task, completed x 4 lines with no reports of dizziness.                   PT Short Term Goals - 01/21/21 1411      PT SHORT TERM GOAL #1   Title Full vestibular assessment will be performed.    Baseline vestibular assesment performed    Time 4    Period Weeks    Status Achieved    Target Date 01/22/21      PT SHORT TERM GOAL #2   Title Pt will be independent in initial HEP for balance and vestibular exercises.    Baseline reports independence; completing 3-4x/week    Time 4    Period Weeks    Status Achieved    Target  Date 01/22/21      PT SHORT TERM GOAL #3   Title Pt will report <3/10 pain in neck/head with activities for improved function.    Baseline 3/10 currently but increases with activities; reports continued 3-4/10 neck pain with quick movements or lifting objects    Time 4    Period Weeks    Status On-going    Target Date 01/22/21      PT SHORT TERM GOAL #4   Title DGI will be performed and LTG written.    Baseline 19/24 on 01/14/21    Time 4    Period Weeks    Status Achieved    Target Date 01/22/21      PT SHORT TERM GOAL #5   Title Pt will decrease 5 x sit to stand from 20 sec to <16 sec for improved balance.    Baseline 12/23/20 20 sec without hands from chair with 5/10 dizziness; 8.62 secs with dizziness 3/10    Time 4    Period Weeks    Status Achieved    Target Date 01/22/21             PT Long Term Goals - 02/15/21 1522      PT LONG TERM GOAL #1   Title Pt will be independent with progressive HEP for balance and vestibular exercises to continue gains on own.  (LTG due by 02/21/21)    Time 8    Period Weeks    Status New      PT LONG TERM GOAL #2   Title Pt will increase gait speed to >0.70m/s for improved gait safety.    Baseline 12/23/20 0.13m/s    Time 8    Period Weeks    Status New      PT LONG TERM GOAL #3   Title Pt will be able to perform functional activities with <3/10 dizziness for improved function.    Baseline currently varies but increases significantly with turning at all. 5/10 dizziness with 5 x sit to stand.  Time 8    Period Weeks    Status New      PT LONG TERM GOAL #4   Title Pt will ambulate >500' on varied surfaces independently for improved community mobility.    Baseline 1200 ft on outdoor varied surfaces    Time 8    Period Weeks    Status Achieved      PT LONG TERM GOAL #5   Title Patient will improve DGI to >/= 22/24 to demonstrate improved balance and reduced fall risk    Baseline 19/24    Time 8    Period Weeks    Status  Revised      PT LONG TERM GOAL #6   Title Pt will demonstrate 10 deg increase in pain free cervical ROM to improve cervical proprioception and vestibular function.    Baseline see flow sheets    Time 8    Period Weeks                 Plan - 02/15/21 1522    Clinical Impression Statement Began assessment of patient's progress toward LTG, patient able to meet LTG #4 today ambulating 1200 ft outdoors on various surfaces. Continued habituation training with hroizontal head turns and cogntiive task with no increase in dizziness, therefore rest of session focused on manual therapy and self care to work toward improved relaxation, and reduced muscle tension to alleviate pain in neck. Patient will continue to benefit from skilled PT services to progress toward all LTGs.    Personal Factors and Comorbidities Comorbidity 3+    Comorbidities PMH: abnormal thyroid function, ADHD, anxiety, arthritis, chronic kidney disease, DJD, fibromyalgia, tinnitus left ear    Examination-Activity Limitations Locomotion Level;Transfers;Stairs;Stand;Dressing;Bathing    Examination-Participation Restrictions Community Activity;Yard Work;Driving    Stability/Clinical Decision Making Evolving/Moderate complexity    Rehab Potential Good    PT Frequency 2x / week   plus eval   PT Duration 8 weeks    PT Treatment/Interventions ADLs/Self Care Home Management;Cryotherapy;Moist Heat;DME Instruction;Therapeutic activities;Functional mobility training;Stair training;Gait training;Therapeutic exercise;Balance training;Neuromuscular re-education;Canalith Repostioning;Patient/family education;Vestibular;Passive range of motion;Manual techniques;Spinal Manipulations;Dry needling    PT Next Visit Plan Finish checkings goals + Re-Cert. How was HEP additions?  For vestibular - still getting dizzy with turning and when first getting out of the car-continue habituation.  Please let me know if she needs any further TDN.  LTG due next  week.    Consulted and Agree with Plan of Care Patient           Patient will benefit from skilled therapeutic intervention in order to improve the following deficits and impairments:  Abnormal gait,Decreased balance,Decreased mobility,Decreased strength,Decreased range of motion,Pain  Visit Diagnosis: Cervicalgia  Dizziness and giddiness  Other abnormalities of gait and mobility  Unsteadiness on feet     Problem List Patient Active Problem List   Diagnosis Date Noted  . 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 [place], [place], [address] Phone: [phone]   Fax:  [phone]  Name: Caressa Scearce MRN: [id number] Date of Birth: 02/14/1946

## 2021-02-18 ENCOUNTER — Other Ambulatory Visit: Payer: Self-pay

## 2021-02-18 ENCOUNTER — Ambulatory Visit: Payer: PPO

## 2021-02-18 DIAGNOSIS — R2689 Other abnormalities of gait and mobility: Secondary | ICD-10-CM

## 2021-02-18 DIAGNOSIS — M542 Cervicalgia: Secondary | ICD-10-CM

## 2021-02-18 DIAGNOSIS — R2681 Unsteadiness on feet: Secondary | ICD-10-CM

## 2021-02-18 DIAGNOSIS — R42 Dizziness and giddiness: Secondary | ICD-10-CM

## 2021-02-18 NOTE — Therapy (Signed)
[facility] [address] [place], [place], [address] Phone: [phone]   Fax:  [phone]  Physical Therapy Treatment/Discharge Summary  Patient Details  Name: Wendy Joyce MRN: [id number] Date of Birth: 11-15-1946 Referring Provider (PT): Asencion Partridge Dohmeier  PHYSICAL THERAPY DISCHARGE SUMMARY  Visits from Start of Care: 16  Current functional level related to goals / functional outcomes: See Clinical Impression Statement for Details.    Remaining deficits: None Noted.   Education / Equipment: HEP Provided  Plan: Patient agrees to discharge.  Patient goals were met. Patient is being discharged due to meeting the stated rehab goals.  ?????           Encounter Date: 02/18/2021   PT End of Session - 02/18/21 1401    Visit Number 16    Number of Visits 17    Date for PT Re-Evaluation 03/23/21   60 day poc, 90 day cert   Authorization Type [employer] medicare so 10th visit progress note    Progress Note Due on Visit 20    PT Start Time 1401    PT Stop Time 1448    PT Time Calculation (min) 47 min    Equipment Utilized During Treatment Gait belt    Activity Tolerance Patient tolerated treatment well    Behavior During Therapy WFL for tasks assessed/performed           Past Medical History:  Diagnosis Date  . Abdominal pain, generalized 01/29/2015  . Abnormal thyroid function test 02/25/2016  . ADHD (attention deficit hyperactivity disorder)   . Allergic rhinitis, unspecified 01/29/2015  . Ankle sprain 03/16/2016  . Anxiety 01/29/2015  . Arthritis    hands  . Chronic kidney disease 2012   kidney stones  . Constipation 01/29/2015  . Decreased libido 05/02/2018  . Disorder of the skin and subcutaneous tissue, unspecified 01/29/2015  . DJD (degenerative joint disease)    hand  . Dysthymia   . Endometriosis   . External otitis   . Family history of coronary artery disease 05/02/2018  . Fatigue  due to excessive exertion 02/24/2016  . Fever blister   . Fibromyalgia 01/29/2015  . Frequency of micturition 01/29/2015  . Functional dyspepsia 01/29/2015  . Gastro-esophageal reflux disease without esophagitis 01/29/2015  . Headache(784.0)   . High cholesterol   . Hyperlipidemia 09/12/2016  . Hypersomnia   . Hypothyroidism   . IBS (irritable bowel syndrome) 05/02/2018  . Insomnia   . Internal hemorrhoids   . 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 Labial pain 11/30/2016  . Lactose intolerance 12/14/2016  . Lactose intolerance in adult 09/12/2016  . 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 Left ureteral stone   . LP (lichen planus) 72/90/2111  . Major depressive disorder, single episode, unspecified   . Migraine with aura, not intractable, without status migrainosus   . Mixed hyperlipidemia   . Mouth pain 01/07/2016  . Osteoarthritis of hip   . Osteopenia determined by x-ray 03/02/2017  . Personal history of estrogen therapy   . Phlebitis and thrombophlebitis of the leg   . Pityriasis alba 01/29/2015  . Pure hypercholesterolemia 06/29/2016  . Rectum pain 09/12/2016  . Recurrent sinus infections 02/24/2016  . Rhinosinusitis 01/07/2016  . Rosacea   . Sinusitis 12/24/2015  . 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 Umbilical hernia without obstruction and without gangrene 01/29/2015  . Umbilical pain 55/20/8022  . Unspecified urinary incontinence 01/07/2016  . Urticaria   . UTI (  urinary tract infection)   . 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 Vitamin D deficiency     Past Surgical History:  Procedure Laterality Date  . ABDOMINAL HYSTERECTOMY     partial  . BREAST ENHANCEMENT SURGERY    . BREAST REDUCTION SURGERY Bilateral 2018  . COLONOSCOPY WITH PROPOFOL N/A 02/25/2013   Procedure: COLONOSCOPY WITH PROPOFOL;  Surgeon: Garlan Fair, MD;  Location: [facility];  Service: Endoscopy;  Laterality: N/A;  . mini  facelift  2000  . NASAL SINUS SURGERY    . ROTATOR CUFF REPAIR     Right  . ROTATOR CUFF REPAIR     Left  . SEPTOPLASTY WITH ETHMOIDECTOMY, AND MAXILLARY ANTROSTOMY Bilateral 2006  . TOTAL ABDOMINAL HYSTERECTOMY    . TUBAL LIGATION    . umbicial hernia      There were no vitals filed for this visit.   Subjective Assessment - 02/18/21 1403    Subjective No new changes. Patient reports that the neck continues to feel better. Tried the tennis ball on the neck and it is helping.    Pertinent History PMH: abnormal thyroid function, ADHD, anxiety, arthritis, chronic kidney disease, DJD, fibromyalgia, tinnitus left ear    Diagnostic tests MRI cervical 12/13/20:     IMPRESSION: This MRI of the cervical spine with and without contrast shows the following:  1.  Multilevel degenerative changes as detailed above that do not lead to spinal stenosis or nerve root compression.  2.  3 mm retrolisthesis of C5 upon C6 and 1 mm anterolisthesis of C6 upon C7.  3.  The spinal cord appears normal  4.   Normal enhancement pattern.        MRI brain 12/13/20:        IMPRESSION: This MRI of the brain with and without contrast shows the following:  1.   Mild generalized cortical atrophy, typical for age.  2.   Few punctate T2/FLAIR hyperintense foci in the hemispheres consistent with negligible chronic microvascular ischemic change.  3.   No acute findings.  4.   Normal enhancement pattern.    Patient Stated Goals Pt wants to move like she is 20 years younger feeling more confident moving around.    Pain Onset More than a month ago              [facility] PT Assessment - 02/18/21 0001      AROM   Overall AROM  Deficits    AROM Assessment Site Cervical    Cervical Flexion 53    Cervical Extension 60    Cervical - Right Side Bend 34    Cervical - Left Side Bend 30   pulling sensation   Cervical - Right Rotation 65    Cervical - Left Rotation 66      Ambulation/Gait   Assistive device None    Gait Pattern  Step-through pattern;Decreased step length - right;Decreased step length - left;Decreased arm swing - right;Decreased arm swing - left;Decreased trunk rotation    Ambulation Surface Level;Indoor    Gait velocity 8.31 secs = 3.94 ft/sec   1.2 m/s     Functional Gait  Assessment   Gait assessed  Yes    Gait Level Surface Walks 20 ft in less than 7 sec but greater than 5.5 sec, uses assistive device, slower speed, mild gait deviations, or deviates 6-10 in outside of the 12 in walkway width.    Change in Gait Speed Able to  smoothly change walking speed without loss of balance or gait deviation. Deviate no more than 6 in outside of the 12 in walkway width.    Gait with Horizontal Head Turns Performs head turns smoothly with slight change in gait velocity (eg, minor disruption to smooth gait path), deviates 6-10 in outside 12 in walkway width, or uses an assistive device.    Gait with Vertical Head Turns Performs head turns with no change in gait. Deviates no more than 6 in outside 12 in walkway width.    Gait and Pivot Turn Pivot turns safely within 3 sec and stops quickly with no loss of balance.    Step Over Obstacle Is able to step over 2 stacked shoe boxes taped together (9 in total height) without changing gait speed. No evidence of imbalance.    Gait with Narrow Base of Support Is able to ambulate for 10 steps heel to toe with no staggering.    Gait with Eyes Closed Walks 20 ft, uses assistive device, slower speed, mild gait deviations, deviates 6-10 in outside 12 in walkway width. Ambulates 20 ft in less than 9 sec but greater than 7 sec.    Ambulating Backwards Walks 20 ft, no assistive devices, good speed, no evidence for imbalance, normal gait    Steps Alternating feet, no rail.    Total Score 27    FGA comment: 27/30            [facility] Adult PT Treatment/Exercise - 02/18/21 0001      Ambulation/Gait   Ambulation/Gait Yes    Ambulation/Gait Assistance 6: Modified independent  (Device/Increase time)    Ambulation/Gait Assistance Details completed ambulation throughout therapy gym, improved arm swing noted and no guarding during ambulation      Standardized Balance Assessment   Standardized Balance Assessment Dynamic Gait Index      Dynamic Gait Index   Level Surface Mild Impairment    Change in Gait Speed Normal    Gait with Horizontal Head Turns Mild Impairment    Gait with Vertical Head Turns Normal    Gait and Pivot Turn Normal    Step Over Obstacle Normal    Step Around Obstacles Normal    Steps Normal    Total Score 22      High Level Balance   High Level Balance Activities Other (comment)    High Level Balance Comments Completed M-CTSIB with patient able to hold all 4 situations for full 30 seconds. No imbalance noted.      Therapeutic Activites    Therapeutic Activities Other Therapeutic Activities    Other Therapeutic Activities Completed sit <> stands x 5 reps with no dizziness, completed 180 deg turns with 1/10 dizziness, and bending over reaching for objects with no dizziness.      Exercises   Exercises Other Exercises    Other Exercises  Completed SCM stretch on L side 2 x 30 seconds; reviewed entire HEP to ensure compliance and address any questions/concerns.             Access Code: [email or web address] URL: [email or web address] Date: 02/18/2021 Prepared by: Baldomero Lamy  Exercises Seated Assisted Cervical Rotation with Towel - 1 x daily - 5 x weekly - 1 sets - 3-4 reps - 10 hold Seated Left Head Turns Vestibular Habituation - 1 x daily - 5 x weekly - 3 sets - 5 reps Thoracic Mobilization on Foam Roll - Hands Clasped - 1 x daily - 5 x weekly - 1 sets -  10 reps - 5 hold Cat Cow - 1 x daily - 5 x weekly - 1 sets - 10 reps - 5 hold Sidelying Thoracic Rotation with Open Book - 1 x daily - 5 x weekly - 1 sets - 10 reps - 5 hold Sternocleidomastoid Stretch - 1 x daily - 5 x weekly - 1 sets - 3 reps - 30 hold        PT  Education - 02/18/21 1404    Education Details progress toward LTGs; HEP update    Person(s) Educated Patient    Methods Explanation;Handout;Demonstration    Comprehension Verbalized understanding;Returned demonstration            PT Short Term Goals - 01/21/21 1411      PT SHORT TERM GOAL #1   Title Full vestibular assessment will be performed.    Baseline vestibular assesment performed    Time 4    Period Weeks    Status Achieved    Target Date 01/22/21      PT SHORT TERM GOAL #2   Title Pt will be independent in initial HEP for balance and vestibular exercises.    Baseline reports independence; completing 3-4x/week    Time 4    Period Weeks    Status Achieved    Target Date 01/22/21      PT SHORT TERM GOAL #3   Title Pt will report <3/10 pain in neck/head with activities for improved function.    Baseline 3/10 currently but increases with activities; reports continued 3-4/10 neck pain with quick movements or lifting objects    Time 4    Period Weeks    Status On-going    Target Date 01/22/21      PT SHORT TERM GOAL #4   Title DGI will be performed and LTG written.    Baseline 19/24 on 01/14/21    Time 4    Period Weeks    Status Achieved    Target Date 01/22/21      PT SHORT TERM GOAL #5   Title Pt will decrease 5 x sit to stand from 20 sec to <16 sec for improved balance.    Baseline 12/23/20 20 sec without hands from chair with 5/10 dizziness; 8.62 secs with dizziness 3/10    Time 4    Period Weeks    Status Achieved    Target Date 01/22/21             PT Long Term Goals - 02/18/21 1405      PT LONG TERM GOAL #1   Title Pt will be independent with progressive HEP for balance and vestibular exercises to continue gains on own.  (LTG due by 02/21/21)    Baseline Reports independence completing daily    Time 8    Period Weeks    Status Achieved      PT LONG TERM GOAL #2   Title Pt will increase gait speed to >0.42ms for improved gait safety.     Baseline 12/23/20 0.336m; 1.2 m/s    Time 8    Period Weeks    Status Achieved      PT LONG TERM GOAL #3   Title Pt will be able to perform functional activities with <3/10 dizziness for improved function.    Baseline currently varies but increases significantly with turning at all. 5/10 dizziness with 5 x sit to stand.; 1/10 with 180 deg turn to L    Time 8    Period  Weeks    Status Achieved      PT LONG TERM GOAL #4   Title Pt will ambulate >500' on varied surfaces independently for improved community mobility.    Baseline 1200 ft on outdoor varied surfaces    Time 8    Period Weeks    Status Achieved      PT LONG TERM GOAL #5   Title Patient will improve DGI to >/= 22/24 to demonstrate improved balance and reduced fall risk    Baseline 19/24; 22/24    Time 8    Period Weeks    Status Achieved      PT LONG TERM GOAL #6   Title Pt will demonstrate 10 deg increase in pain free cervical ROM to improve cervical proprioception and vestibular function.    Baseline see flow sheets    Time 8    Period Weeks    Status Achieved                 Plan - 02/18/21 1457    Clinical Impression Statement Today's skilled PT session focused on assessment of patient's progress toward all LTGs. Patient able to meet all LTG demonstrating improvements in Cervical ROM, dizziness, balance and functional mobility with PT services. Patient is currently ambulating at 1.2 m/s demonstrating community ambulator. Patient also scoring 22/24 on DGI and 27/30 on FGA demonstrating low fall risk and improved balance. Cervical ROM has improved significantly and reduced pain/restriction noted promoting more functional mobility. Patient has made significant progress with PT services and demonstrating readiness for discharge. PT reviewing/updating HEP and educating on continued compliance upon discharge. Patient verbalize agreement with d/c.    Personal Factors and Comorbidities Comorbidity 3+    Comorbidities  PMH: abnormal thyroid function, ADHD, anxiety, arthritis, chronic kidney disease, DJD, fibromyalgia, tinnitus left ear    Examination-Activity Limitations Locomotion Level;Transfers;Stairs;Stand;Dressing;Bathing    Examination-Participation Restrictions Community Activity;Yard Work;Driving    Stability/Clinical Decision Making Evolving/Moderate complexity    Rehab Potential Good    PT Frequency 2x / week   plus eval   PT Duration 8 weeks    PT Treatment/Interventions ADLs/Self Care Home Management;Cryotherapy;Moist Heat;DME Instruction;Therapeutic activities;Functional mobility training;Stair training;Gait training;Therapeutic exercise;Balance training;Neuromuscular re-education;Canalith Repostioning;Patient/family education;Vestibular;Passive range of motion;Manual techniques;Spinal Manipulations;Dry needling    Consulted and Agree with Plan of Care Patient           Patient will benefit from skilled therapeutic intervention in order to improve the following deficits and impairments:  Abnormal gait,Decreased balance,Decreased mobility,Decreased strength,Decreased range of motion,Pain  Visit Diagnosis: Cervicalgia  Dizziness and giddiness  Other abnormalities of gait and mobility  Unsteadiness on feet     Problem List Patient Active Problem List   Diagnosis Date Noted  . 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 [place], [place], [address] Phone: [phone]   Fax:  [phone]  Name: Wendy Joyce MRN: [id number] Date of Birth: 01/15/1946

## 2021-02-18 NOTE — Patient Instructions (Signed)
Access Code: [email or web address] URL: [email or web address] Date: 02/18/2021 Prepared by: Baldomero Lamy  Exercises Seated Assisted Cervical Rotation with Towel - 1 x daily - 5 x weekly - 1 sets - 3-4 reps - 10 hold Seated Left Head Turns Vestibular Habituation - 1 x daily - 5 x weekly - 3 sets - 5 reps Thoracic Mobilization on Foam Roll - Hands Clasped - 1 x daily - 5 x weekly - 1 sets - 10 reps - 5 hold Cat Cow - 1 x daily - 5 x weekly - 1 sets - 10 reps - 5 hold Sidelying Thoracic Rotation with Open Book - 1 x daily - 5 x weekly - 1 sets - 10 reps - 5 hold Sternocleidomastoid Stretch - 1 x daily - 5 x weekly - 1 sets - 3 reps - 30 hold

## 2021-03-01 DIAGNOSIS — F411 Generalized anxiety disorder: Secondary | ICD-10-CM | POA: Diagnosis not present

## 2021-03-01 DIAGNOSIS — G4700 Insomnia, unspecified: Secondary | ICD-10-CM | POA: Diagnosis not present

## 2021-03-01 DIAGNOSIS — F331 Major depressive disorder, recurrent, moderate: Secondary | ICD-10-CM | POA: Diagnosis not present

## 2021-03-01 DIAGNOSIS — J309 Allergic rhinitis, unspecified: Secondary | ICD-10-CM | POA: Diagnosis not present

## 2021-03-01 DIAGNOSIS — G2581 Restless legs syndrome: Secondary | ICD-10-CM | POA: Diagnosis not present

## 2021-03-09 ENCOUNTER — Ambulatory Visit: Payer: PPO | Admitting: Neurology

## 2021-03-09 ENCOUNTER — Encounter: Payer: Self-pay | Admitting: Neurology

## 2021-03-09 VITALS — BP 108/61 | HR 54 | Ht 63.0 in | Wt 131.0 lb

## 2021-03-09 DIAGNOSIS — R42 Dizziness and giddiness: Secondary | ICD-10-CM

## 2021-03-09 DIAGNOSIS — S060X1A Concussion with loss of consciousness of 30 minutes or less, initial encounter: Secondary | ICD-10-CM | POA: Insufficient documentation

## 2021-03-09 DIAGNOSIS — S069X0A Unspecified intracranial injury without loss of consciousness, initial encounter: Secondary | ICD-10-CM

## 2021-03-09 DIAGNOSIS — S069X9A Unspecified intracranial injury with loss of consciousness of unspecified duration, initial encounter: Secondary | ICD-10-CM | POA: Diagnosis not present

## 2021-03-09 DIAGNOSIS — S069XAA Unspecified intracranial injury with loss of consciousness status unknown, initial encounter: Secondary | ICD-10-CM

## 2021-03-09 MED ORDER — DONEPEZIL HCL 5 MG PO TABS: 5.0000 mg | ORAL_TABLET | Freq: Two times a day (BID) | ORAL | 5 refills | Status: DC

## 2021-03-09 NOTE — Progress Notes (Signed)
[facility]    Provider:  Larey Seat, MD  Primary Care Physician:  Fanny Bien, [address] [place] [address]     Referring Provider: Josetta Huddle, Md [address] [place],  [place] [address]          Chief Complaint according to patient   Patient presents with:    . New Patient (Initial Visit)           HISTORY OF PRESENT ILLNESS:   03-09-2021: Reviist for  Wendy Joyce ,a 75 y.o. year old patient with postconcussion syndrome.  I have requested a cervical spine MRI as well as a brain MRI to evaluate the patient's concern of fall, postconcussive headaches and vertigo.  Her brain MRI performed on 12/13/2020 showed mild generalized cortical atrophy in normal range for age.  Some negligible chronic microvascular ischemic changes and no acute findings normal enhancement pattern.   The MRI was interpreted by Arlice Colt, the MRI of the cervical spine was performed on the same day with and without contrast it showed multilevel degenerative changes of the cervical spine but normal any evidence of compression of the spinal canal or foramina.  Headaches have improved. aricept was given based on anecdotal evidence for improvement of cognitive function. She likes the effect and can continue to use it, I will refill. She has tried to wean off klonopin and developed RLS, was given sertraline by dr Ernie Hew, MD. .  She underwent vestibular rehab with Ricky Stabs and she felt well treated and noted improvement, dry needling increased ROM for the neck, reduced the stiffness . Vertigo is much improved. Central , not vestibular.  She continues to participate in Yoga as a training for balance and equilibrium.    We reviewed all studies.   She was seen here in a consultation, upon referral on 12/15/2-21 from Dr. Inda Merlin.  Chief concern according to patient :  Wendy Joyce describes that on November 06, 2020 she was in a boutique's dressing  room and tried on a pair of jeans while standing.  She lost balance but was unable to break the fall or respond adequately she saw herself literally falling to the ground she fell backwards as he describes it like a rock and hit the back of her head however the floor there was carpeted but in the fall she hit the wall mounted mirror in her cabin.  She feels that there was no loss of consciousness or loss of awareness she had to maneuver on her side and then got herself up without assistance of another person. The second fall occurred on Friday, 3 December of this year she was actually sitting down on a stool in her own dressing area which is part of her bathroom and has a hard marble floor.  She was putting on leggings and stood up slowly to hang up her bathroom and during the day rotating movement to reach the clock she again fell backwards she felt no warning aura anything she just watched herself in slow motion and her head hit heart on the marble floor in the closet door.  She also injured her right shoulder elbow right hip and hand.  She just had a surgical intervention for the right hand.  She reports her neck has remained stiff and tender, she has noted more crackling noises, she has tinnitus, stronger in the left ear- she moves very slow for no food.  Again  there was no abnormal enhancement here noted either.  The patient also underwent nerve conduction studies for pain in the right arm that was related to the fall.  Needle examination of the upper extremity was normal there was no electrodiagnostic evidence of an interruption of the large neural fibers at the time.  Electromyography showed no abnormal stimulation pattern.  , is feeling at risk , and not safe, avoiding any fast head or truncal movements.  She has a remote history of whiplash, from college days.  She had not yet seen her cardiologist Dr. Leory Plowman, has not yet undergone cardiac monitoring to see if that could be an arrhythmia.       Wendy Joyce is seen on 12-09-2020, a right -handed White or Caucasian female who  has a past medical history of Abdominal pain, generalized (01/29/2015), Abnormal thyroid function test (02/25/2016), ADHD (attention deficit hyperactivity disorder), Allergic rhinitis, unspecified (01/29/2015), Ankle sprain (03/16/2016), Anxiety (01/29/2015), Arthritis, Chronic kidney disease (2012), Constipation (01/29/2015), Decreased libido (05/02/2018), Disorder of the skin and subcutaneous tissue, unspecified (01/29/2015), DJD (degenerative joint disease), Dysthymia, Endometriosis, External otitis, Family history of coronary artery disease (05/02/2018), Fatigue due to excessive exertion (02/24/2016), Fever blister, Fibromyalgia (01/29/2015), Frequency of micturition (01/29/2015), Functional dyspepsia (01/29/2015), Gastro-esophageal reflux disease without esophagitis (01/29/2015), Headache(784.0), High cholesterol, Hyperlipidemia (09/12/2016), Hypersomnia, Hypothyroidism, IBS (irritable bowel syndrome) (05/02/2018), Insomnia, Internal hemorrhoids, Irritable 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 (01/29/2015), Labial pain (11/30/2016), Lactose intolerance (12/14/2016), Lactose intolerance in adult (09/12/2016), 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), Left ureteral stone, 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), Major depressive disorder, single episode, unspecified, Migraine with aura, not intractable, without status migrainosus, Mixed hyperlipidemia, Mouth pain (01/07/2016), Osteoarthritis of hip, Osteopenia determined by x-ray (03/02/2017), Personal history of estrogen therapy, Phlebitis and thrombophlebitis of the leg, Pityriasis alba (01/29/2015), Pure hypercholesterolemia (06/29/2016), Rectum pain (09/12/2016), Recurrent sinus infections (02/24/2016), Rhinosinusitis (01/07/2016), Rosacea, Sinusitis (12/24/2015), Tinnitus of left ear (32/35/5732), Umbilical hernia without obstruction and without gangrene (20/25/4270), Umbilical  pain (62/37/6283), Unspecified urinary incontinence (01/07/2016), Urticaria, UTI (urinary tract infection), 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   Review of Systems: Out of a complete 14 system review, the patient complains of only the following symptoms, and all other reviewed systems are negative.: see above    Social History   Socioeconomic History  . Marital status: Married    Spouse name: Not on file  . Number of children: 4, and 5 grandchildren  . Years of education: college  . Highest education level: Not on file  Occupational History  . Not on file:                                 Art school teacher, painter, gallerist   Tobacco Use  . Smoking status: Never Smoker  . Smokeless tobacco: Never Used  Substance and Sexual Activity  . Alcohol use: Yes    Alcohol/week: 1.0 standard drink    Types: 1 Glasses of wine per week    Comment: every night  . Drug use: No  . Sexual activity: Yes    Partners: Male  Other Topics Concern  . Not on file  Social History Narrative   Married   Regular exercise: yes; 2 x a week with a trainer   Caffeine use: coffee daily   Social Determinants of Health   Financial Resource Strain: Not on file  Food Insecurity: Not on file  Transportation Needs: Not  on file  Physical Activity: Not on file  Stress: Not on file  Social Connections: Not on file    No family history on file. : Mother with dementia.   Past Medical History:  Diagnosis Date  . Abdominal pain, generalized 01/29/2015  . Abnormal thyroid function test 02/25/2016  . ADHD (attention deficit hyperactivity disorder)   . Allergic rhinitis, unspecified 01/29/2015  . Ankle sprain 03/16/2016  . Anxiety 01/29/2015  . Arthritis    hands  . Chronic kidney disease 2012   kidney stones  . Constipation 01/29/2015  . Decreased libido 05/02/2018  . Disorder of the skin and subcutaneous tissue, unspecified 01/29/2015   . DJD (degenerative joint disease)    hand  . Dysthymia   . Endometriosis   . External otitis   . Family history of coronary artery disease 05/02/2018  . Fatigue due to excessive exertion 02/24/2016  . Fever blister   . Fibromyalgia 01/29/2015  . Frequency of micturition 01/29/2015  . Functional dyspepsia 01/29/2015  . Gastro-esophageal reflux disease without esophagitis 01/29/2015  . Headache(784.0)   . High cholesterol   . Hyperlipidemia 09/12/2016  . Hypersomnia   . Hypothyroidism   . IBS (irritable bowel syndrome) 05/02/2018  . Insomnia   . Internal hemorrhoids   . 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 Labial pain 11/30/2016  . Lactose intolerance 12/14/2016  . Lactose intolerance in adult 09/12/2016  . 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 Left ureteral stone   . LP (lichen planus) 09/60/4540  . Major depressive disorder, single episode, unspecified   . Migraine with aura, not intractable, without status migrainosus   . Mixed hyperlipidemia   . Mouth pain 01/07/2016  . Osteoarthritis of hip   . Osteopenia determined by x-ray 03/02/2017  . Personal history of estrogen therapy   . Phlebitis and thrombophlebitis of the leg   . Pityriasis alba 01/29/2015  . Pure hypercholesterolemia 06/29/2016  . Rectum pain 09/12/2016  . Recurrent sinus infections 02/24/2016  . Rhinosinusitis 01/07/2016  . Rosacea   . Sinusitis 12/24/2015  . 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 Umbilical hernia without obstruction and without gangrene 01/29/2015  . Umbilical pain 98/10/9146  . Unspecified urinary incontinence 01/07/2016  . Urticaria   . UTI (urinary tract infection)   . 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 Vitamin D deficiency     Past Surgical History:  Procedure Laterality Date  . ABDOMINAL HYSTERECTOMY     partial  . BREAST ENHANCEMENT SURGERY    . BREAST REDUCTION SURGERY Bilateral 2018  . COLONOSCOPY  WITH PROPOFOL N/A 02/25/2013   Procedure: COLONOSCOPY WITH PROPOFOL;  Surgeon: Garlan Fair, MD;  Location: [facility];  Service: Endoscopy;  Laterality: N/A;  . mini facelift  2000  . NASAL SINUS SURGERY    . ROTATOR CUFF REPAIR     Right  . ROTATOR CUFF REPAIR     Left  . SEPTOPLASTY WITH ETHMOIDECTOMY, AND MAXILLARY ANTROSTOMY Bilateral 2006  . TOTAL ABDOMINAL HYSTERECTOMY    . TUBAL LIGATION    . umbicial hernia       Current Outpatient Medications on File Prior to Visit  Medication Sig Dispense Refill  . acyclovir ointment (ZOVIRAX) 5 % Apply 1 application topically daily as needed (for fever blister).    Marland Kitchen albuterol (VENTOLIN HFA) 108 (90 Base) MCG/ACT inhaler Inhale 2 puffs into the lungs every 6 (six) hours as needed for wheezing or shortness of breath.  18 g 5  . Alirocumab (PRALUENT) 75 MG/ML SOAJ Inject 75 mg into the skin every 14 (fourteen) days.    . ALPRAZolam (XANAX) 0.5 MG tablet Take 1 tablet (0.5 mg total) by mouth at bedtime as needed (vertigo prn). 30 tablet 0  . azelastine (ASTELIN) 0.1 % nasal spray Use in each nostril as directed    . cetirizine (ZYRTEC) 10 MG tablet Take 10 mg by mouth daily.    . Cholecalciferol (VITAMIN D3) 1000 units CAPS Take 1 capsule by mouth daily.    . Coenzyme Q10 (COQ10 PO) Take 100 mg by mouth daily.    . diclofenac sodium (VOLTAREN) 1 % GEL Apply 2 g topically 3 (three) times daily as needed.    . donepezil (ARICEPT) 5 MG tablet Take 1 tablet (5 mg total) by mouth 2 (two) times daily. 60 tablet 2  . Estrad-Estriol-Testost-Progest (BI-EST PROGEST-TESTOSTERONE TD) Place onto the skin as directed.    Marland Kitchen estradiol (ESTRACE) 0.5 MG tablet Take 0.5 mg by mouth daily. Taking half/day so taking 0.25mg   3  . levothyroxine (SYNTHROID) 100 MCG tablet Take 100 mcg by mouth daily before breakfast.    . Lidocaine, Anorectal, 5 % CREA Apply topically in the morning, at noon, in the evening, and at bedtime.    . Multiple Vitamin (MULTIVITAMIN WITH  MINERALS) TABS tablet Take 1 tablet by mouth daily.    . Omega-3 Fatty Acids (FISH OIL PO) Take 1,400 mg by mouth daily.    . polyethylene glycol (MIRALAX / GLYCOLAX) 17 g packet Take 17 g by mouth daily.    . Pseudoephedrine-guaiFENesin (GUAIFENESIN-PSEUDOEPHEDRINE PO) Take by mouth.    . sertraline (ZOLOFT) 25 MG tablet Take 75 mg by mouth daily.    Teena Dunk Cherry 1200 MG CAPS Take 1 capsule by mouth in the morning and at bedtime.    . TGT PSYLLIUM FIBER PO Take 4 capsules by mouth daily.    . Turmeric 500 MG TABS Take 1,000 mg by mouth in the morning and at bedtime.    . valACYclovir (VALTREX) 500 MG tablet Take 250 mg by mouth 2 (two) times daily as needed (Out breaks Fever blisters).     . zaleplon (SONATA) 10 MG capsule Take 10 mg by mouth at bedtime as needed.     No current facility-administered medications on file prior to visit.    Allergies  Allergen Reactions  . Adhesive [Tape] Dermatitis  . Citrus Nausea And Vomiting  . Escitalopram Oxalate Hives  . Niacin And Related     Rash and breathing breathing problems  . Amoxicillin Rash    Unsure of reaction/ was instructed not to take drug. Unknown reaction     Physical exam: 12-09-2020:  The patient underwent orthostatic blood pressure and heart rate measurements by nurse Mardene Celeste.  In supine position her blood pressure was 128/72 mmHg with a regular heart rate of 71 bpm.  As she went on to be seated she did feel slightly dizzy.  In a seated position her blood pressure was 131/64 mmHg with a regular heart rate of 72 bpm.  Once standing her blood pressure was 117/65 with a heart rate of 86 and again regular.    There is an appropriate increase in heart rate to compensate for the lower blood pressure the blood pressure between seated and standing position dropped by more than 10 mmHg which can be significant.   Wt Readings from Last 3 Encounters:  03/09/21 131 lb (59.4 kg)  12/31/20 133 lb 4 oz (60.4 kg)  12/11/20 134 lb (60.8  kg)     Ht Readings from Last 3 Encounters:  03/09/21 5\' 3"  (1.6 m)  12/31/20 5\' 3"  (1.6 m)  12/11/20 5\' 3"  (1.6 m)      General: The patient is awake, alert and appears not in acute distress. The patient is well groomed. Head: Normocephalic, atraumatic. Neck is tender - right occipital are and cervical  3-4-5,  right sided paraspinal tenderness, pain from palpation tere radiated into the elbow, antebrachium and thumb/ index-finger of the right hand.  Mallampati 1.  The patient also describes a relentless tinnitus nonpulsatile in the left ear. Rapid head movement and rotation flexion extension lead to an exacerbation of nystagmus with gaze to the left.  This is only a horizontal and not a vertical eye movement abnormality.,  neck circumference:13 inches . Nasal airflow  patent.  Retrognathia is not  seen.  Dental status: intact  Cardiovascular:  Regular rate and cardiac rhythm by pulse,  without distended neck veins. Respiratory: Lungs are clear to auscultation.  Skin:  Without evidence of ankle edema, or rash. Trunk: The patient's posture is erect.   Neurologic exam : The patient is awake and alert, oriented to place and time.   Memory subjective described as intact.  Attention span & concentration ability appears normal.  Speech is fluent,  without  dysarthria, dysphonia or aphasia. She subjectively has reported wordfinding difficutlies.  Mood and affect are anxious. .   Cranial nerves: no loss of smell or taste reported  Pupils are equal and briskly reactive to light. Funduscopic- status post lens replacements.  Extraocular movements in vertical planes were intact and without nystagmus   No Diplopia. She reports blurred vision, transiently.  Visual fields by finger perimetry are intact. Hearing was intact to soft voice and finger rubbing.    Facial sensation intact to fine touch.  Facial motor strength is symmetric and tongue and uvula move midline.  Neck ROM : impaired ROM  -rotation, tilt and flexion extension were associated with crackling and tenderness, occipital and cervical- paraspinal tenderness on the right.  The  shoulder shrug was symmetrical.    Motor exam:  Symmetric bulk, tone and ROM.   Normal tone without cog -wheeling, symmetrically weaker grip strength .   Sensory:  Fine touch and vibration were . Proprioception tested in the upper extremities was abnormal.  Coordination: Rapid alternating movements in the fingers/hands were of reduced speed, she reports her handwriting having changed.  The Finger-to-nose maneuver was impaired - evidence of ataxia, dysmetria but not tremor.    Gait and station: Patient could rise unassisted from a seated position, walked very,very slow but without assistive device.  Stance is of normal width/ base and the patient turned with 4 steps.  Toe and heel walk were deferred.  Deep tendon reflexes: in the upper and lower extremities are symmetric, at 2 plus /intact.  Babinski response was deferred.      After spending a total time of  20 minutes face to face and additional time for physical and neurologic examination, review of laboratory studies,  personal review of imaging studies, reports and results of other testing and review of referral information / records as far as provided in visit, I have established the following assessments:   The cause of falls is still indeterminate - but with the falls she has suffered concussion times 2 .   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 resulting in post-concussive vertigo, tinnitus and some memory changes=   improvement with PT, Vest rehab, dry needling and medication on all aspects.       My Plan is to proceed with:  1)  Aricept refilled for cognitive symptoms, aphasia, memory.   5 mg bid po. This is a monthly trial based on anecdotal evidence .   I would like to thank Fanny Bien, MD and Josetta Huddle, [place] [address] [place],  [place] [address] for allowing me to meet with and to take care of this pleasant patient.   In short, Wendy Joyce is presenting with a postconcussion manifestation, and will follow up in 6 month with NP  For  MMSE/ MOCA.  CC: I will share my notes with PCP.  Electronically signed by: Larey Seat, MD 03/09/2021 1:34 PM  [facility] Board certified by The [employer] of Sleep Medicine and  Fellow of the [employer] of Neurology. Medical Director of [facility].

## 2021-03-09 NOTE — Patient Instructions (Signed)
Head Injury, Adult There are many types of head injuries. They can be as minor as a small bump. Some head injuries can be worse. Worse injuries include:  A strong hit to the head that shakes the brain back and forth, causing damage (concussion).  A bruise (contusion) of the brain. This means there is bleeding in the brain that can cause swelling.  A cracked skull (skull fracture).  Bleeding in the brain that gathers, gets thick (makes a clot), and forms a bump (hematoma). Most problems from a head injury come in the first 24 hours. However, you may still have side effects up to 7-10 days after your injury. It is important to watch your condition for any changes. You may need to be watched in the emergency department or urgent care, or you may need to stay in the hospital. What are the causes? There are many possible causes of a head injury. A serious head injury may be caused by:  A car accident.  Bicycle or motorcycle accidents.  Sports injuries.  Falls.  Being hit by an object. What are the signs or symptoms? Symptoms of a head injury include a bruise, bump, or bleeding where the injury happened. Other physical symptoms may include:  Headache.  Feeling like you may vomit (nauseous) or vomiting.  Dizziness.  Blurred or double vision.  Being uncomfortable around bright lights or loud noises.  Shaking movements that you cannot control (seizures).  Feeling tired.  Trouble being woken up.  Fainting or loss of consciousness. Mental or emotional symptoms may include:  Feeling grumpy or cranky.  Confusion and memory problems.  Having trouble paying attention or concentrating.  Changes in eating or sleeping habits.  Feeling worried or nervous (anxious).  Feeling sad (depressed). How is this treated? Treatment for this condition depends on how severe the injury is and the type of injury you have. The main goal is to prevent problems and to allow the brain time to  heal. Mild head injury If you have a mild head injury, you may be sent home, and treatment may include:  Being watched. A responsible adult should stay with you for 24 hours after your injury and check on you often.  Physical rest.  Brain rest.  Pain medicines. Severe head injury If you have a severe head injury, treatment may include:  Being watched closely. This includes staying in the hospital.  Medicines to: ? Help with pain. ? Prevent seizures. ? Help with brain swelling.  Protecting your airway and using a machine that helps you breathe (ventilator).  Treatments to watch for and manage swelling inside the brain.  Brain surgery. This may be needed to: ? Remove a collection of blood or blood clots. ? Stop the bleeding. ? Remove a part of the skull. This allows room for the brain to swell. Follow these instructions at home: Activity  Rest.  Avoid activities that are hard or tiring.  Make sure you get enough sleep.  Let your brain rest. Do this by limiting activities that need a lot of thought or attention, such as: ? Watching TV. ? Playing memory games and puzzles. ? Job-related work or homework. ? Working on the computer, social media, and texting.  Avoid activities that could cause another head injury until your doctor says it is okay. This includes playing sports. Having another head injury, especially before the first one has healed, can be dangerous.  Ask your doctor when it is safe for you to go back to   your normal activities, such as work or school. Ask your doctor for a step-by-step plan for slowly going back to your normal activities.  Ask your doctor when you can drive, ride a bicycle, or use heavy machinery. Do not do these activities if you are dizzy. Lifestyle  Do not drink alcohol until your doctor says it is okay.  Do not use drugs.  If it is harder than usual to remember things, write them down.  If you are easily distracted, try to do one  thing at a time.  Talk with family members or close friends when making important decisions.  Tell your friends, family, a trusted co-worker, and work manager about your injury, symptoms, and limits (restrictions). Have them watch for any problems that are new or getting worse.   General instructions  Take over-the-counter and prescription medicines only as told by your doctor.  Have someone stay with you for 24 hours after your head injury. This person should watch you for any changes in your symptoms and be ready to get help.  Keep all follow-up visits as told by your doctor. This is important. How is this prevented?  Work on your balance and strength. This can help you avoid falls.  Wear a seat belt when you are in a moving vehicle.  Wear a helmet when you: ? Ride a bicycle. ? Ski. ? Do any other sport or activity that has a risk of injury.  If you drink alcohol: ? Limit how much you use to:  0-1 drink a day for nonpregnant women.  0-2 drinks a day for men. ? Be aware of how much alcohol is in your drink. In the [place] one drink equals one 12 oz bottle of beer (355 mL), one 5 oz glass of wine (148 mL), or one 1 oz glass of hard liquor (44 mL).  Make your home safer by: ? Getting rid of clutter from the floors and stairs. This includes things that can make you trip. ? Using grab bars in bathrooms and handrails by stairs. ? Placing non-slip mats on floors and in bathtubs. ? Putting more light in dim areas. Where to find more information  Centers for Disease Control and Prevention: www.cdc.gov Get help right away if:  You have: ? A very bad headache that is not helped by medicine. ? Trouble walking or weakness in your arms and legs. ? Clear or bloody fluid coming from your nose or ears. ? Changes in how you see (vision). ? A seizure. ? More confusion or more grumpy moods.  Your symptoms get worse.  You are sleepier than normal and have trouble staying awake.  You  lose your balance.  The black centers of your eyes (pupils) change in size.  Your speech is slurred.  Your dizziness gets worse.  You vomit. These symptoms may be an emergency. Do not wait to see if the symptoms will go away. Get medical help right away. Call your local emergency services (911 in the [place] Do not drive yourself to the hospital. Summary  Head injuries can be as minor as a small bump. Some head injuries can be worse.  Treatment for this condition depends on how severe the injury is and the type of injury you have.  Have someone stay with you for 24 hours after your head injury.  Ask your doctor when it is safe for you to go back to your normal activities, such as work or school.  To prevent a head injury,   wear a seat belt in a car, wear a helmet when you use a bicycle, limit your alcohol use, and make your home safer. This information is not intended to replace advice given to you by your health care provider. Make sure you discuss any questions you have with your health care provider. Document Revised: 10/25/2019 Document Reviewed: 10/25/2019 Elsevier Patient Education  2021 Elsevier Inc.  

## 2021-03-24 ENCOUNTER — Ambulatory Visit: Payer: PPO | Attending: Neurology | Admitting: Audiologist

## 2021-03-24 ENCOUNTER — Other Ambulatory Visit: Payer: Self-pay

## 2021-03-24 DIAGNOSIS — H903 Sensorineural hearing loss, bilateral: Secondary | ICD-10-CM | POA: Diagnosis not present

## 2021-03-24 DIAGNOSIS — H9312 Tinnitus, left ear: Secondary | ICD-10-CM | POA: Insufficient documentation

## 2021-03-24 NOTE — Procedures (Signed)
[facility] [place], [place]  [id number] [phone]  AUDIOLOGICAL  EVALUATION  NAME: Wendy Joyce     DOB:   [birth year]      MRN: [id number]                                                                                     DATE: 03/24/2021     REFERENT: Fanny Bien, MD STATUS: Outpatient DIAGNOSIS:    History: Wendy Joyce was seen for an audiological evaluation.  Wendy Joyce is receiving a hearing evaluation due to concerns for tinnitus in her left ear after a severe concussion last December. She has post concussive syndrome. She is received an MRI due to headaches and vertigo which was determined to be normal for her age. She also received vestibular therapy with Ricky Stabs for central dizziness.   Wendy Joyce has difficulty understanding people on occasion and hearing the TV. The ringing in her left ear is her main concern. He has had tinnitus for years but it was not too bothersome. After suffering from a concussion last December the tinnitus in her left ear became much worse. No other relevant case history reported.   Evaluation:   Otoscopy showed a clear view of the tympanic membranes, bilaterally  Tympanometry results were consistent with normal middle ear pressure.   Audiometric testing was completed using conventional audiometry with insert transducer. Speech Recognition Thresholds were consistent with pure tone averages. Word Recognition was excellent at conversation level. 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 Test results are consistent with slight high frequency hearing loss.  Tinnitus pitch and loudness matched to 2k Hz pure tone, played in the right ear to match to the left ear. Matched to 2k Hz at 34dB SL. This sound was rated an 8.5 on a scale of 1-10 with 10 a perfect match to her tinnitus.   Results:  The test results were reviewed with Wendy Joyce. Her hearing loss  is mild and does not require amplification. However her tinnitus is centered at the pitch at which her hearing starts to decline. Therefore the tinnitus is likely due to the hearing loss and was aggravated by her brain injury. When matched to external sound, the tinnitus is relatively soft (around 15dB), so if Wendy Joyce can draw her attention away from the tinnitus and calm her nervous system then the tinnitus will decrease as well. This will also lower stress levels and help her brain to recover from the concussion. This requires giving her brain a break from hearing the ringing. To calm her nervous system and mask the tinnitus there are several options. The free option is a custom built sound on the free MyNoise app. Use the app to build a steady state sound that provides relief at a soft level. For use at night the [facility] Sleep Buds are recommended as they fit in the ear and play constant steady state masking sounds. Then even if Wendy Joyce yhas one ear in the pillow she is still receiving that masking benefit. If this strategy does not provide relief, then recommend a tinnitus assessment with Dr. Argentina Ponder for a  more in depth and intensive evaluation.    Recommendations: 1. Annual audiologic testing is needed to monitor hearing loss and tinnitus.  2. Use of Bose Sleep Buds is recommended to mask tinnitus when needed. Wendy Joyce was given a handout with [employer] Buds information.  Use of masker should be at night and when in quiet environments where the tinnitus is loud or when around triggering sound. Masker should be played at lowest level possible that provides relief from tinnitus.  Earplugs only recommended when around continuous exposure to truly loud sound, such as a concert. Not recommended for every day use.  3. Amplification is not necessary at this time. Recommend using masking to provide tinnitus relief.  4. Due to Wendy Joyce's tinnitus severity, if tinnitus increased then follow up with Dr. Deatra Ina  at [facility] recommended. Dr. Argentina Ponder specializes in evaluation and therapeutic treatment of severe sound sensitivity and tinnitus. [facility] Address: [address] [address]  Appointments : [phone]   Alfonse Alpers  Audiologist, Au.D., CCC-A 03/24/2021  2:50 PM  Cc: Fanny Bien, MD

## 2021-03-30 ENCOUNTER — Ambulatory Visit: Payer: PPO | Admitting: Audiology

## 2021-03-31 ENCOUNTER — Other Ambulatory Visit: Payer: Self-pay

## 2021-03-31 ENCOUNTER — Ambulatory Visit (INDEPENDENT_AMBULATORY_CARE_PROVIDER_SITE_OTHER): Payer: PPO | Admitting: Internal Medicine

## 2021-03-31 ENCOUNTER — Encounter: Payer: Self-pay | Admitting: Internal Medicine

## 2021-03-31 VITALS — BP 106/70 | HR 59 | Ht 63.0 in | Wt 134.0 lb

## 2021-03-31 DIAGNOSIS — E063 Autoimmune thyroiditis: Secondary | ICD-10-CM

## 2021-03-31 DIAGNOSIS — E039 Hypothyroidism, unspecified: Secondary | ICD-10-CM | POA: Diagnosis not present

## 2021-03-31 LAB — TSH: TSH: 0.24 u[IU]/mL — ABNORMAL LOW (ref 0.35–4.50)

## 2021-03-31 NOTE — Progress Notes (Signed)
Name: Wendy Joyce  MRN/ DOB: [id number], 10/27/1946    Age/ Sex: 75 y.o., female     PCP: Wendy Bien, MD   Reason for Endocrinology Evaluation:  Hypothyroid     Initial Endocrinology Clinic Visit:  12/31/2020    PATIENT IDENTIFIER: Wendy Joyce is a 75 y.o., female with a past medical history of migraine headaches, IBS, and dyslipidemia. She has followed with [facility] since 12/31/2020 for consultative assistance with management of her hypothyroid.   HISTORICAL SUMMARY:  She has been diagnosed with hypothyroidism many years ago. Has been on LT-4 replacement  since her diagnosis .  On her initial visit with me she was having extreme anxiety, diarrhea, and fatigue as well as sleep issues.  Her TSH was low and we reduced her LT-4 replacement therapy.  Anti-TPO levels were elevated at 75 IU/mL Sister with thyroid disorder SUBJECTIVE:     Today (03/31/2021):  Wendy Joyce is here for hypothyroid.  Weight stable  Has slight constipation , has IBS Has a lot of anxiety  Denies local neck symptoms  Concussion is improving with occasional dizziness   Synthroid 100 mg, 6 days a week- sometimes she forgets and takes it 7 days a week    HISTORY:  Past Medical History:  Past Medical History:  Diagnosis Date  . Abdominal pain, generalized 01/29/2015  . Abnormal thyroid function test 02/25/2016  . ADHD (attention deficit hyperactivity disorder)   . Allergic rhinitis, unspecified 01/29/2015  . Ankle sprain 03/16/2016  . Anxiety 01/29/2015  . Arthritis    hands  . Chronic kidney disease 2012   kidney stones  . Constipation 01/29/2015  . Decreased libido 05/02/2018  . Disorder of the skin and subcutaneous tissue, unspecified 01/29/2015  . DJD (degenerative joint disease)    hand  . Dysthymia   . Endometriosis   . External otitis   . Family history of coronary artery disease 05/02/2018  . Fatigue due to excessive exertion 02/24/2016  .  Fever blister   . Fibromyalgia 01/29/2015  . Frequency of micturition 01/29/2015  . Functional dyspepsia 01/29/2015  . Gastro-esophageal reflux disease without esophagitis 01/29/2015  . Headache(784.0)   . High cholesterol   . Hyperlipidemia 09/12/2016  . Hypersomnia   . Hypothyroidism   . IBS (irritable bowel syndrome) 05/02/2018  . Insomnia   . Internal hemorrhoids   . 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 Labial pain 11/30/2016  . Lactose intolerance 12/14/2016  . Lactose intolerance in adult 09/12/2016  . 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 Left ureteral stone   . LP (lichen planus) 74/94/4967  . Major depressive disorder, single episode, unspecified   . Migraine with aura, not intractable, without status migrainosus   . Mixed hyperlipidemia   . Mouth pain 01/07/2016  . Osteoarthritis of hip   . Osteopenia determined by x-ray 03/02/2017  . Personal history of estrogen therapy   . Phlebitis and thrombophlebitis of the leg   . Pityriasis alba 01/29/2015  . Pure hypercholesterolemia 06/29/2016  . Rectum pain 09/12/2016  . Recurrent sinus infections 02/24/2016  . Rhinosinusitis 01/07/2016  . Rosacea   . Sinusitis 12/24/2015  . 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 Umbilical hernia without obstruction and without gangrene 01/29/2015  . Umbilical pain 59/16/3846  . Unspecified urinary incontinence 01/07/2016  . Urticaria   . UTI (urinary tract infection)   . 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 Vitamin D  deficiency    Past Surgical History:  Past Surgical History:  Procedure Laterality Date  . ABDOMINAL HYSTERECTOMY     partial  . BREAST ENHANCEMENT SURGERY    . BREAST REDUCTION SURGERY Bilateral 2018  . COLONOSCOPY WITH PROPOFOL N/A 02/25/2013   Procedure: COLONOSCOPY WITH PROPOFOL;  Surgeon: Garlan Fair, MD;  Location: [facility];  Service: Endoscopy;  Laterality: N/A;  . mini facelift  2000  .  NASAL SINUS SURGERY    . ROTATOR CUFF REPAIR     Right  . ROTATOR CUFF REPAIR     Left  . SEPTOPLASTY WITH ETHMOIDECTOMY, AND MAXILLARY ANTROSTOMY Bilateral 2006  . TOTAL ABDOMINAL HYSTERECTOMY    . TUBAL LIGATION    . umbicial hernia      Social History:  reports that she has never smoked. She has never used smokeless tobacco. She reports current alcohol use of about 1.0 standard drink of alcohol per week. She reports that she does not use drugs.  Family History: No family history on file.   HOME MEDICATIONS: Allergies as of 03/31/2021      Reactions   Adhesive [tape] Dermatitis   Citrus Nausea And Vomiting   Escitalopram Oxalate Hives   Niacin And Related    Rash and breathing breathing problems   Amoxicillin Rash   Unsure of reaction/ was instructed not to take drug. Unknown reaction      Medication List       Accurate as of March 31, 2021  1:34 PM. If you have any questions, ask your nurse or doctor.        STOP taking these medications   ALPRAZolam 0.5 MG tablet Commonly known as: XANAX Stopped by: Dorita Sciara, MD     TAKE these medications   acyclovir ointment 5 % Commonly known as: ZOVIRAX Apply 1 application topically daily as needed (for fever blister).   albuterol 108 (90 Base) MCG/ACT inhaler Commonly known as: VENTOLIN HFA Inhale 2 puffs into the lungs every 6 (six) hours as needed for wheezing or shortness of breath.   azelastine 0.1 % nasal spray Commonly known as: ASTELIN Use in each nostril as directed   BI-EST PROGEST-TESTOSTERONE TD Place onto the skin as directed.   cetirizine 10 MG tablet Commonly known as: ZYRTEC Take 10 mg by mouth daily.   COQ10 PO Take 100 mg by mouth daily.   diclofenac sodium 1 % Gel Commonly known as: VOLTAREN Apply 2 g topically 3 (three) times daily as needed.   donepezil 5 MG tablet Commonly known as: ARICEPT Take 1 tablet (5 mg total) by mouth 2 (two) times daily.   estradiol 0.5 MG  tablet Commonly known as: ESTRACE Take 0.5 mg by mouth daily. Taking half/day so taking 0.25mg    FISH OIL PO Take 1,400 mg by mouth daily.   GUAIFENESIN-PSEUDOEPHEDRINE PO Take by mouth.   levothyroxine 100 MCG tablet Commonly known as: SYNTHROID Take 100 mcg by mouth daily before breakfast.   Lidocaine (Anorectal) 5 % Crea Apply topically in the morning, at noon, in the evening, and at bedtime.   multivitamin with minerals Tabs tablet Take 1 tablet by mouth daily.   polyethylene glycol 17 g packet Commonly known as: MIRALAX / GLYCOLAX Take 17 g by mouth daily.   Praluent 75 MG/ML Soaj Generic drug: Alirocumab Inject 75 mg into the skin every 14 (fourteen) days.   sertraline 25 MG tablet Commonly known as: ZOLOFT Take 50 mg by mouth daily.   Tart  Cherry 1200 MG Caps Take 1 capsule by mouth in the morning and at bedtime.   TGT PSYLLIUM FIBER PO Take 4 capsules by mouth daily.   Turmeric 500 MG Tabs Take 1,000 mg by mouth in the morning and at bedtime.   valACYclovir 500 MG tablet Commonly known as: VALTREX Take 250 mg by mouth 2 (two) times daily as needed (Out breaks Fever blisters).   Vitamin D3 25 MCG (1000 UT) Caps Take 1 capsule by mouth daily.   zaleplon 10 MG capsule Commonly known as: SONATA Take 10 mg by mouth at bedtime as needed.         OBJECTIVE:   PHYSICAL EXAM: VS: BP 106/70   Pulse (!) 59   Ht 5\' 3"  (1.6 m)   Wt 134 lb (60.8 kg)   SpO2 98%   BMI 23.74 kg/m    EXAM: General: Pt appears well and is in NAD  Neck: General: Supple without adenopathy. Thyroid: Thyroid size normal.  No goiter or nodules appreciated.   Lungs: Clear with good BS bilat with no rales, rhonchi, or wheezes  Heart: Auscultation: RRR.  Abdomen: Normoactive bowel sounds, soft, nontender, without masses or organomegaly palpable  Extremities:  BL LE: No pretibial edema normal ROM and strength.  Mental Status: Judgment, insight: Intact Orientation: Oriented to  time, place, and person Memory: Intact for recent and remote events Mood and affect: No depression, anxiety, or agitation     DATA REVIEWED: Results for MARYKATE, HEUBERGER (MRN [id number]) as of 04/01/2021 12:23  Ref. Range 03/31/2021 13:47  TSH Latest Ref Range: 0.35 - 4.50 uIU/mL 0.24 (L)   Results for NIKITTA, SOBIECH (MRN [id number]) as of 03/31/2021 12:51  Ref. Range 12/31/2020 11:21  Thyroperoxidase Ab SerPl-aCnc Latest Ref Range: <9 IU/mL 75 (H)     ASSESSMENT / PLAN / RECOMMENDATIONS:   1. Hashimoto's thyroiditis  - Pt is clinically euthyroid  - No local neck symptoms - Taking Synthroid appropriately.  -Repeat TSH levels continue to be low, we will reduce her Synthroid dose as below  Medications   Stop Synthroid 100 mcg  Start Synthroid 75 MCG daily  F/U in 6 months  Labs in 8 weeks  Signed electronically by: Mack Guise, MD  [facility]  [facility] [address] [place], [place] [address] Phone: [phone] FAX: [phone]      CC: Wendy Joyce, [address] [place] [address] Phone: [phone]  Fax: [phone]   Return to Endocrinology clinic as below: Future Appointments  Date Time Provider [place]  06/08/2021  1:45 PM Megan Salon, MD [facility]  09/09/2021  1:30 PM Ward Givens, NP [facility] None

## 2021-04-01 MED ORDER — LEVOTHYROXINE SODIUM 75 MCG PO TABS: 75.0000 ug | ORAL_TABLET | Freq: Every day | ORAL | 3 refills | Status: DC

## 2021-04-15 DIAGNOSIS — F331 Major depressive disorder, recurrent, moderate: Secondary | ICD-10-CM | POA: Diagnosis not present

## 2021-04-15 DIAGNOSIS — F411 Generalized anxiety disorder: Secondary | ICD-10-CM | POA: Diagnosis not present

## 2021-04-15 DIAGNOSIS — J309 Allergic rhinitis, unspecified: Secondary | ICD-10-CM | POA: Diagnosis not present

## 2021-04-15 DIAGNOSIS — E039 Hypothyroidism, unspecified: Secondary | ICD-10-CM | POA: Diagnosis not present

## 2021-04-15 DIAGNOSIS — G4700 Insomnia, unspecified: Secondary | ICD-10-CM | POA: Diagnosis not present

## 2021-04-15 DIAGNOSIS — K589 Irritable 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: Secondary | ICD-10-CM | POA: Diagnosis not present

## 2021-04-15 DIAGNOSIS — F0390 Unspecified dementia without behavioral disturbance: Secondary | ICD-10-CM | POA: Diagnosis not present

## 2021-04-26 DIAGNOSIS — D225 Melanocytic nevi of trunk: Secondary | ICD-10-CM | POA: Diagnosis not present

## 2021-04-26 DIAGNOSIS — D485 Neoplasm of uncertain behavior of skin: Secondary | ICD-10-CM | POA: Diagnosis not present

## 2021-04-26 DIAGNOSIS — D2371 Other benign neoplasm of skin of right lower limb, including hip: Secondary | ICD-10-CM | POA: Diagnosis not present

## 2021-04-26 DIAGNOSIS — D1801 Hemangioma of skin and subcutaneous tissue: Secondary | ICD-10-CM | POA: Diagnosis not present

## 2021-06-02 ENCOUNTER — Telehealth: Payer: Self-pay | Admitting: Cardiology

## 2021-06-02 NOTE — Telephone Encounter (Signed)
Patient called to get a copy of her last EKG tracing to be send to her dr office fax [phone] Dr. Darrin Luis at [facility]. Patient states her surgery is next week and they need to review the information. Please advise

## 2021-06-02 NOTE — Telephone Encounter (Signed)
Verified fax number. Faxed EKG per pt request. Confirmation received.

## 2021-06-08 ENCOUNTER — Ambulatory Visit (HOSPITAL_BASED_OUTPATIENT_CLINIC_OR_DEPARTMENT_OTHER): Payer: PPO | Admitting: Obstetrics & Gynecology

## 2021-06-15 ENCOUNTER — Ambulatory Visit (HOSPITAL_BASED_OUTPATIENT_CLINIC_OR_DEPARTMENT_OTHER): Payer: PPO | Admitting: Obstetrics & Gynecology

## 2021-06-28 ENCOUNTER — Other Ambulatory Visit: Payer: Self-pay | Admitting: Neurology

## 2021-07-15 ENCOUNTER — Ambulatory Visit (INDEPENDENT_AMBULATORY_CARE_PROVIDER_SITE_OTHER): Payer: PPO | Admitting: Obstetrics & Gynecology

## 2021-07-15 ENCOUNTER — Other Ambulatory Visit: Payer: Self-pay

## 2021-07-15 ENCOUNTER — Encounter (HOSPITAL_BASED_OUTPATIENT_CLINIC_OR_DEPARTMENT_OTHER): Payer: Self-pay | Admitting: Obstetrics & Gynecology

## 2021-07-15 VITALS — BP 121/84 | HR 47 | Ht 63.5 in | Wt 138.4 lb

## 2021-07-15 DIAGNOSIS — Z1231 Encounter for screening mammogram for malignant neoplasm of breast: Secondary | ICD-10-CM | POA: Diagnosis not present

## 2021-07-15 DIAGNOSIS — Z79890 Hormone replacement therapy (postmenopausal): Secondary | ICD-10-CM

## 2021-07-15 DIAGNOSIS — B009 Herpesviral infection, unspecified: Secondary | ICD-10-CM | POA: Diagnosis not present

## 2021-07-15 DIAGNOSIS — Z90710 Acquired absence of both cervix and uterus: Secondary | ICD-10-CM

## 2021-07-15 MED ORDER — ACYCLOVIR 200 MG PO CAPS: 200.0000 mg | ORAL_CAPSULE | Freq: Two times a day (BID) | ORAL | 5 refills | Status: DC

## 2021-07-15 NOTE — Progress Notes (Signed)
75 y.o. Married White or Caucasian female here for new patient exam.  Has been on HRT long term.  Does not want to stop this.  Reviewed risks of DVT, PE, breast cancer, and stroke.  Pt clearly aware.  D/w pt considering lowering dosage.  She will consider but states she does not feel as good when on lower dosage.  Denies vaginal bleeding.    No LMP recorded. Patient has had a hysterectomy.          Sexually active: Yes.    The current method of family planning is status post hysterectomy.    Smoker:  yes  Health Maintenance: Pap:  01/14/2016 History of abnormal Pap:  no MMG:  08/11/2020 Outside Colonoscopy:  [facility], unsure of exact year BMD:   unsure TDaP:  unsure Shingrix:   completed Hep C testing: not in prior lab work.  Will order if we need blood work in the future Screening Labs: done with Dr. Ernie Hew   reports that she has never smoked. She has never used smokeless tobacco. She reports current alcohol use of about 1.0 standard drink of alcohol per week. She reports that she does not use drugs.  Past Medical History:  Diagnosis Date  . Abdominal pain, generalized 01/29/2015  . Abnormal thyroid function test 02/25/2016  . ADHD (attention deficit hyperactivity disorder)   . Allergic rhinitis, unspecified 01/29/2015  . Ankle sprain 03/16/2016  . Anxiety 01/29/2015  . Arthritis    hands  . Chronic kidney disease 2012   kidney stones  . Constipation 01/29/2015  . Decreased libido 05/02/2018  . Disorder of the skin and subcutaneous tissue, unspecified 01/29/2015  . DJD (degenerative joint disease)    hand  . Dysthymia   . Endometriosis   . External otitis   . Family history of coronary artery disease 05/02/2018  . Fatigue due to excessive exertion 02/24/2016  . Fever blister   . Fibromyalgia 01/29/2015  . Frequency of micturition 01/29/2015  . Functional dyspepsia 01/29/2015  . Gastro-esophageal reflux disease without esophagitis 01/29/2015  . Headache(784.0)   . High  cholesterol   . Hyperlipidemia 09/12/2016  . Hypersomnia   . Hypothyroidism   . IBS (irritable bowel syndrome) 05/02/2018  . Insomnia   . Internal hemorrhoids   . 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 Labial pain 11/30/2016  . Lactose intolerance 12/14/2016  . Lactose intolerance in adult 09/12/2016  . 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 Left ureteral stone   . LP (lichen planus) 14/43/1540  . Major depressive disorder, single episode, unspecified   . Migraine with aura, not intractable, without status migrainosus   . Mixed hyperlipidemia   . Mouth pain 01/07/2016  . Osteoarthritis of hip   . Osteopenia determined by x-ray 03/02/2017  . Personal history of estrogen therapy   . Phlebitis and thrombophlebitis of the leg   . Pityriasis alba 01/29/2015  . Pure hypercholesterolemia 06/29/2016  . Rectum pain 09/12/2016  . Recurrent sinus infections 02/24/2016  . Rhinosinusitis 01/07/2016  . Rosacea   . Sinusitis 12/24/2015  . 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 Umbilical hernia without obstruction and without gangrene 01/29/2015  . Umbilical pain 08/67/6195  . Unspecified urinary incontinence 01/07/2016  . Urticaria   . UTI (urinary tract infection)   . 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 Vitamin D deficiency     Past Surgical History:  Procedure Laterality Date  . BREAST ENHANCEMENT SURGERY    .  BREAST REDUCTION SURGERY Bilateral 2018  . COLONOSCOPY WITH PROPOFOL N/A 02/25/2013   Procedure: COLONOSCOPY WITH PROPOFOL;  Surgeon: Garlan Fair, MD;  Location: [facility];  Service: Endoscopy;  Laterality: N/A;  . mini facelift  2000  . NASAL SINUS SURGERY    . ROTATOR CUFF REPAIR     Right  . ROTATOR CUFF REPAIR     Left  . SEPTOPLASTY WITH ETHMOIDECTOMY, AND MAXILLARY ANTROSTOMY Bilateral 2006  . TOTAL ABDOMINAL HYSTERECTOMY     ovaries remain  . TUBAL LIGATION    . umbicial hernia      Current  Outpatient Medications  Medication Sig Dispense Refill  . acyclovir (ZOVIRAX) 200 MG capsule Take 1 capsule (200 mg total) by mouth 2 (two) times daily. Take as needed for symptoms. 60 capsule 5  . acyclovir ointment (ZOVIRAX) 5 % Apply 1 application topically daily as needed (for fever blister).    Marland Kitchen albuterol (VENTOLIN HFA) 108 (90 Base) MCG/ACT inhaler Inhale 2 puffs into the lungs every 6 (six) hours as needed for wheezing or shortness of breath. 18 g 5  . Alirocumab (PRALUENT) 75 MG/ML SOAJ Inject 75 mg into the skin every 14 (fourteen) days.    Marland Kitchen azelastine (ASTELIN) 0.1 % nasal spray Use in each nostril as directed    . cetirizine (ZYRTEC) 10 MG tablet Take 10 mg by mouth daily.    . diclofenac sodium (VOLTAREN) 1 % GEL Apply 2 g topically 3 (three) times daily as needed.    . donepezil (ARICEPT) 5 MG tablet TAKE 1 TABLET BY MOUTH TWICE A DAY 180 tablet 1  . estradiol (ESTRACE) 0.5 MG tablet Take 0.5 mg by mouth daily. Taking half/day so taking 0.25mg   3  . levothyroxine (SYNTHROID) 75 MCG tablet Take 1 tablet (75 mcg total) by mouth daily. 90 tablet 3  . Lidocaine, Anorectal, 5 % CREA Apply topically in the morning, at noon, in the evening, and at bedtime.    . Multiple Vitamin (MULTIVITAMIN WITH MINERALS) TABS tablet Take 1 tablet by mouth daily.    . Omega-3 Fatty Acids (FISH OIL PO) Take 1,400 mg by mouth daily.    . polyethylene glycol (MIRALAX / GLYCOLAX) 17 g packet Take 17 g by mouth daily.    . sertraline (ZOLOFT) 25 MG tablet Take 50 mg by mouth daily.    . TGT PSYLLIUM FIBER PO Take 4 capsules by mouth daily.    . valACYclovir (VALTREX) 500 MG tablet Take 250 mg by mouth 2 (two) times daily as needed (Out breaks Fever blisters).     . zaleplon (SONATA) 10 MG capsule Take 10 mg by mouth at bedtime as needed.     No current facility-administered medications for this visit.    Family History  Problem Relation Age of Onset  . Breast cancer Maternal Grandmother   . Stroke  Father   . Hypertension Father   . Cancer Father     Review of Systems  Constitutional: Negative.   Cardiovascular: Negative.   Genitourinary: Negative.    Exam:   BP 121/84 (BP Location: Right Arm, Patient Position: Sitting, Cuff Size: Small)   Pulse (!) 47   Ht 5' 3.5" (1.613 m)   Wt 138 lb 6.4 oz (62.8 kg)   BMI 24.13 kg/m   Height: 5' 3.5" (161.3 cm)  General appearance: alert, cooperative and appears stated age Head: Normocephalic, without obvious abnormality, atraumatic Neck: no adenopathy, supple, symmetrical, trachea midline and thyroid normal to inspection and  palpation Lungs: clear to auscultation bilaterally Breasts: normal appearance, no masses or tenderness Heart: regular rate and rhythm Abdomen: soft, non-tender; bowel sounds normal; no masses,  no organomegaly Extremities: extremities normal, atraumatic, no cyanosis or edema Skin: Skin color, texture, turgor normal. No rashes or lesions Lymph nodes: Cervical, supraclavicular, and axillary nodes normal. No abnormal inguinal nodes palpated Neurologic: Grossly normal   Pelvic: External genitalia:  no lesions              Urethra:  normal appearing urethra with no masses, tenderness or lesions              Bartholins and Skenes: normal                 Vagina: normal appearing vagina with normal color and no discharge, no lesions              Cervix: absent              Pap taken: No. Bimanual Exam:  Uterus:  uterus absent              Adnexa: no mass, fullness, tenderness               Rectovaginal: Confirms               Anus:  normal sphincter tone, no lesions  Chaperone, Octaviano Batty, CMA, was present for exam.  Assessment/Plan: 1. Encounter for screening mammogram for malignant neoplasm of breast - MM 3D SCREEN BREAST BILATERAL; Future  2. HSV-1 (herpes simplex virus 1) infection - acyclovir (ZOVIRAX) 200 MG capsule; Take 1 capsule (200 mg total) by mouth 2 (two) times daily. Take as needed for  symptoms.  Dispense: 60 capsule; Refill: 5  3. H/O: hysterectomy  4. Hormone replacement therapy (HRT) - Dr. Ernie Hew writes this rx for pt but we did discuss lowering dosage for risk reduction

## 2021-07-19 ENCOUNTER — Other Ambulatory Visit (HOSPITAL_BASED_OUTPATIENT_CLINIC_OR_DEPARTMENT_OTHER): Payer: Self-pay | Admitting: Obstetrics & Gynecology

## 2021-07-19 ENCOUNTER — Ambulatory Visit (HOSPITAL_BASED_OUTPATIENT_CLINIC_OR_DEPARTMENT_OTHER)
Admission: RE | Admit: 2021-07-19 | Discharge: 2021-07-19 | Disposition: A | Discharge status: Home / Self Care | Payer: PPO | Source: Ambulatory Visit | Attending: Obstetrics & Gynecology | Admitting: Obstetrics & Gynecology

## 2021-07-19 ENCOUNTER — Other Ambulatory Visit: Payer: Self-pay

## 2021-07-19 ENCOUNTER — Encounter (HOSPITAL_BASED_OUTPATIENT_CLINIC_OR_DEPARTMENT_OTHER): Payer: Self-pay

## 2021-07-19 DIAGNOSIS — Z1231 Encounter for screening mammogram for malignant neoplasm of breast: Secondary | ICD-10-CM | POA: Insufficient documentation

## 2021-08-12 ENCOUNTER — Ambulatory Visit (HOSPITAL_BASED_OUTPATIENT_CLINIC_OR_DEPARTMENT_OTHER)
Admission: RE | Admit: 2021-08-12 | Discharge: 2021-08-12 | Disposition: A | Discharge status: Home / Self Care | Payer: PPO | Source: Ambulatory Visit | Attending: Obstetrics & Gynecology | Admitting: Obstetrics & Gynecology

## 2021-08-12 ENCOUNTER — Other Ambulatory Visit: Payer: Self-pay

## 2021-08-12 ENCOUNTER — Encounter (HOSPITAL_BASED_OUTPATIENT_CLINIC_OR_DEPARTMENT_OTHER): Payer: Self-pay | Admitting: Radiology

## 2021-08-12 DIAGNOSIS — Z1231 Encounter for screening mammogram for malignant neoplasm of breast: Secondary | ICD-10-CM | POA: Insufficient documentation

## 2021-08-16 DIAGNOSIS — Z1331 Encounter for screening for depression: Secondary | ICD-10-CM | POA: Diagnosis not present

## 2021-08-16 DIAGNOSIS — Z23 Encounter for immunization: Secondary | ICD-10-CM | POA: Diagnosis not present

## 2021-08-16 DIAGNOSIS — E782 Mixed hyperlipidemia: Secondary | ICD-10-CM | POA: Diagnosis not present

## 2021-08-16 DIAGNOSIS — B009 Herpesviral infection, unspecified: Secondary | ICD-10-CM | POA: Diagnosis not present

## 2021-08-16 DIAGNOSIS — F0390 Unspecified dementia without behavioral disturbance: Secondary | ICD-10-CM | POA: Diagnosis not present

## 2021-08-16 DIAGNOSIS — Z1339 Encounter for screening examination for other mental health and behavioral disorders: Secondary | ICD-10-CM | POA: Diagnosis not present

## 2021-08-16 DIAGNOSIS — Z1211 Encounter for screening for malignant neoplasm of colon: Secondary | ICD-10-CM | POA: Diagnosis not present

## 2021-08-16 DIAGNOSIS — F411 Generalized anxiety disorder: Secondary | ICD-10-CM | POA: Diagnosis not present

## 2021-08-16 DIAGNOSIS — H1013 Acute atopic conjunctivitis, bilateral: Secondary | ICD-10-CM | POA: Diagnosis not present

## 2021-08-16 DIAGNOSIS — Z0000 Encounter for general adult medical examination without abnormal findings: Secondary | ICD-10-CM | POA: Diagnosis not present

## 2021-08-16 DIAGNOSIS — J309 Allergic rhinitis, unspecified: Secondary | ICD-10-CM | POA: Diagnosis not present

## 2021-08-17 ENCOUNTER — Other Ambulatory Visit (HOSPITAL_BASED_OUTPATIENT_CLINIC_OR_DEPARTMENT_OTHER): Payer: Self-pay | Admitting: Family Medicine

## 2021-08-17 DIAGNOSIS — F330 Major depressive disorder, recurrent, mild: Secondary | ICD-10-CM | POA: Diagnosis not present

## 2021-08-17 DIAGNOSIS — E2839 Other primary ovarian failure: Secondary | ICD-10-CM

## 2021-08-17 DIAGNOSIS — F4312 Post-traumatic stress disorder, chronic: Secondary | ICD-10-CM | POA: Diagnosis not present

## 2021-08-19 ENCOUNTER — Ambulatory Visit (HOSPITAL_BASED_OUTPATIENT_CLINIC_OR_DEPARTMENT_OTHER)
Admission: RE | Admit: 2021-08-19 | Discharge: 2021-08-19 | Disposition: A | Discharge status: Home / Self Care | Payer: PPO | Source: Ambulatory Visit | Attending: Family Medicine | Admitting: Family Medicine

## 2021-08-19 ENCOUNTER — Other Ambulatory Visit: Payer: Self-pay

## 2021-08-19 DIAGNOSIS — E2839 Other primary ovarian failure: Secondary | ICD-10-CM | POA: Diagnosis not present

## 2021-08-19 DIAGNOSIS — M8589 Other specified disorders of bone density and structure, multiple sites: Secondary | ICD-10-CM | POA: Diagnosis not present

## 2021-08-23 DIAGNOSIS — H02831 Dermatochalasis of right upper eyelid: Secondary | ICD-10-CM | POA: Diagnosis not present

## 2021-08-23 DIAGNOSIS — Z961 Presence of intraocular lens: Secondary | ICD-10-CM | POA: Diagnosis not present

## 2021-08-23 DIAGNOSIS — H524 Presbyopia: Secondary | ICD-10-CM | POA: Diagnosis not present

## 2021-08-23 DIAGNOSIS — H5212 Myopia, left eye: Secondary | ICD-10-CM | POA: Diagnosis not present

## 2021-08-23 DIAGNOSIS — H52202 Unspecified astigmatism, left eye: Secondary | ICD-10-CM | POA: Diagnosis not present

## 2021-08-24 DIAGNOSIS — F4312 Post-traumatic stress disorder, chronic: Secondary | ICD-10-CM | POA: Diagnosis not present

## 2021-08-24 DIAGNOSIS — F330 Major depressive disorder, recurrent, mild: Secondary | ICD-10-CM | POA: Diagnosis not present

## 2021-08-31 DIAGNOSIS — M810 Age-related osteoporosis without current pathological fracture: Secondary | ICD-10-CM | POA: Diagnosis not present

## 2021-08-31 DIAGNOSIS — J3089 Other allergic rhinitis: Secondary | ICD-10-CM | POA: Diagnosis not present

## 2021-08-31 DIAGNOSIS — M8580 Other specified disorders of bone density and structure, unspecified site: Secondary | ICD-10-CM | POA: Diagnosis not present

## 2021-08-31 DIAGNOSIS — E559 Vitamin D deficiency, unspecified: Secondary | ICD-10-CM | POA: Diagnosis not present

## 2021-09-03 DIAGNOSIS — F330 Major depressive disorder, recurrent, mild: Secondary | ICD-10-CM | POA: Diagnosis not present

## 2021-09-03 DIAGNOSIS — F4312 Post-traumatic stress disorder, chronic: Secondary | ICD-10-CM | POA: Diagnosis not present

## 2021-09-09 ENCOUNTER — Ambulatory Visit: Payer: PPO | Admitting: Adult Health

## 2021-09-09 ENCOUNTER — Encounter: Payer: Self-pay | Admitting: Adult Health

## 2021-09-09 ENCOUNTER — Other Ambulatory Visit: Payer: Self-pay

## 2021-09-09 VITALS — BP 131/65 | HR 48 | Ht 63.0 in | Wt 138.8 lb

## 2021-09-09 DIAGNOSIS — F0781 Postconcussional syndrome: Secondary | ICD-10-CM | POA: Diagnosis not present

## 2021-09-09 DIAGNOSIS — F32A Depression, unspecified: Secondary | ICD-10-CM | POA: Diagnosis not present

## 2021-09-09 MED ORDER — DONEPEZIL HCL 5 MG PO TABS: 5.0000 mg | ORAL_TABLET | Freq: Two times a day (BID) | ORAL | 3 refills | Status: DC

## 2021-09-09 NOTE — Patient Instructions (Signed)
Your Plan:  Continue aricept Can be refilled by Dr. Ernie Hew If your symptoms worsen or you develop new symptoms please let us know.    Thank you for coming to see [place] at [facility]. I hope we have been able to provide you high quality care today.  You may receive a patient satisfaction survey over the next few weeks. We would appreciate your feedback and comments so that we may continue to improve ourselves and the health of our patients.
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
## 2021-09-14 DIAGNOSIS — F4312 Post-traumatic stress disorder, chronic: Secondary | ICD-10-CM | POA: Diagnosis not present

## 2021-09-14 DIAGNOSIS — F330 Major depressive disorder, recurrent, mild: Secondary | ICD-10-CM | POA: Diagnosis not present

## 2021-09-21 DIAGNOSIS — F330 Major depressive disorder, recurrent, mild: Secondary | ICD-10-CM | POA: Diagnosis not present

## 2021-09-21 DIAGNOSIS — H02834 Dermatochalasis of left upper eyelid: Secondary | ICD-10-CM | POA: Diagnosis not present

## 2021-09-21 DIAGNOSIS — H57813 Brow ptosis, bilateral: Secondary | ICD-10-CM | POA: Diagnosis not present

## 2021-09-21 DIAGNOSIS — F4312 Post-traumatic stress disorder, chronic: Secondary | ICD-10-CM | POA: Diagnosis not present

## 2021-09-21 DIAGNOSIS — H0279 Other degenerative disorders of eyelid and periocular area: Secondary | ICD-10-CM | POA: Diagnosis not present

## 2021-09-21 DIAGNOSIS — H02412 Mechanical ptosis of left eyelid: Secondary | ICD-10-CM | POA: Diagnosis not present

## 2021-09-21 DIAGNOSIS — H02421 Myogenic ptosis of right eyelid: Secondary | ICD-10-CM | POA: Diagnosis not present

## 2021-09-21 DIAGNOSIS — H02413 Mechanical ptosis of bilateral eyelids: Secondary | ICD-10-CM | POA: Diagnosis not present

## 2021-09-21 DIAGNOSIS — H53483 Generalized contraction of visual field, bilateral: Secondary | ICD-10-CM | POA: Diagnosis not present

## 2021-09-21 DIAGNOSIS — H02423 Myogenic ptosis of bilateral eyelids: Secondary | ICD-10-CM | POA: Diagnosis not present

## 2021-09-21 DIAGNOSIS — H02422 Myogenic ptosis of left eyelid: Secondary | ICD-10-CM | POA: Diagnosis not present

## 2021-09-21 DIAGNOSIS — E785 Hyperlipidemia, unspecified: Secondary | ICD-10-CM | POA: Insufficient documentation

## 2021-09-21 DIAGNOSIS — H02831 Dermatochalasis of right upper eyelid: Secondary | ICD-10-CM | POA: Diagnosis not present

## 2021-09-21 DIAGNOSIS — M797 Fibromyalgia: Secondary | ICD-10-CM | POA: Insufficient documentation

## 2021-09-21 DIAGNOSIS — H02411 Mechanical ptosis of right eyelid: Secondary | ICD-10-CM | POA: Diagnosis not present

## 2021-09-28 DIAGNOSIS — F4312 Post-traumatic stress disorder, chronic: Secondary | ICD-10-CM | POA: Diagnosis not present

## 2021-09-28 DIAGNOSIS — F330 Major depressive disorder, recurrent, mild: Secondary | ICD-10-CM | POA: Diagnosis not present

## 2021-10-04 ENCOUNTER — Ambulatory Visit (INDEPENDENT_AMBULATORY_CARE_PROVIDER_SITE_OTHER): Payer: PPO | Admitting: Internal Medicine

## 2021-10-04 ENCOUNTER — Other Ambulatory Visit: Payer: Self-pay

## 2021-10-04 ENCOUNTER — Encounter: Payer: Self-pay | Admitting: Internal Medicine

## 2021-10-04 VITALS — BP 116/74 | HR 60 | Ht 63.0 in | Wt 140.8 lb

## 2021-10-04 DIAGNOSIS — E039 Hypothyroidism, unspecified: Secondary | ICD-10-CM

## 2021-10-04 DIAGNOSIS — E063 Autoimmune thyroiditis: Secondary | ICD-10-CM | POA: Diagnosis not present

## 2021-10-04 DIAGNOSIS — R635 Abnormal weight gain: Secondary | ICD-10-CM | POA: Diagnosis not present

## 2021-10-04 LAB — TSH: TSH: 2.56 u[IU]/mL (ref 0.35–5.50)

## 2021-10-04 NOTE — Progress Notes (Signed)
Name: Wendy Joyce  MRN/ DOB: [id number], 1946-08-28    Age/ Sex: 75 y.o., female     PCP: Fanny Bien, MD   Reason for Endocrinology Evaluation:  Hypothyroid     Initial Endocrinology Clinic Visit:  12/31/2020    PATIENT IDENTIFIER: Wendy Joyce is a 75 y.o., female with a past medical history of migraine headaches, IBS, and dyslipidemia. She has followed with [facility] since 12/31/2020 for consultative assistance with management of her hypothyroid.   HISTORICAL SUMMARY:  She has been diagnosed with hypothyroidism many years ago. Has been on LT-4 replacement  since her diagnosis .   On her initial visit with me she was having extreme anxiety, diarrhea, and fatigue as well as sleep issues.  Her TSH was low and we reduced her LT-4 replacement therapy.  Anti-TPO levels were elevated at 75 IU/mL Sister with thyroid disorder SUBJECTIVE:     Today (10/04/2021):  Wendy Joyce is here for hypothyroid.   Has noted weight gain since reducing levothyroxine in 03/2021, despite following a diet  Has IBS- C controlled  Anxiety stable  Denies local neck symptoms  Continues with dizziness but improved   Synthroid 75 mcg daily     HISTORY:  Past Medical History:  Past Medical History:  Diagnosis Date   Abdominal pain, generalized 01/29/2015   Abnormal thyroid function test 02/25/2016   ADHD (attention deficit hyperactivity disorder)    Allergic rhinitis, unspecified 01/29/2015   Ankle sprain 03/16/2016   Anxiety 01/29/2015   Arthritis    hands   Chronic kidney disease 2012   kidney stones   Constipation 01/29/2015   Decreased libido 05/02/2018   Disorder of the skin and subcutaneous tissue, unspecified 01/29/2015   DJD (degenerative joint disease)    hand   Dysthymia    Endometriosis    External otitis    Family history of coronary artery disease 05/02/2018   Fatigue due to excessive exertion 02/24/2016   Fever blister     Fibromyalgia 01/29/2015   Frequency of micturition 01/29/2015   Functional dyspepsia 01/29/2015   Gastro-esophageal reflux disease without esophagitis 01/29/2015   Headache(784.0)    High cholesterol    Hyperlipidemia 09/12/2016   Hypersomnia    Hypothyroidism    IBS (irritable bowel syndrome) 05/02/2018   Insomnia    Internal hemorrhoids    Irritable 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 01/29/2015   Labial pain 11/30/2016   Lactose intolerance 12/14/2016   Lactose intolerance in adult 09/12/2016   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   Left ureteral stone    LP (lichen planus) 70/35/0093   Major depressive disorder, single episode, unspecified    Migraine with aura, not intractable, without status migrainosus    Mixed hyperlipidemia    Mouth pain 01/07/2016   Osteoarthritis of hip    Osteopenia determined by x-ray 03/02/2017   Personal history of estrogen therapy    Phlebitis and thrombophlebitis of the leg    Pityriasis alba 01/29/2015   Pure hypercholesterolemia 06/29/2016   Rectum pain 09/12/2016   Recurrent sinus infections 02/24/2016   Rhinosinusitis 01/07/2016   Rosacea    Sinusitis 12/24/2015   Tinnitus of left ear 81/82/9937   Umbilical hernia without obstruction and without gangrene 16/96/7893   Umbilical pain 81/12/7508   Unspecified urinary incontinence 01/07/2016   Urticaria    UTI (urinary tract infection)    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   Vitamin D deficiency  Past Surgical History:  Past Surgical History:  Procedure Laterality Date   BREAST ENHANCEMENT SURGERY     BREAST REDUCTION SURGERY Bilateral 2018   COLONOSCOPY WITH PROPOFOL N/A 02/25/2013   Procedure: COLONOSCOPY WITH PROPOFOL;  Surgeon: Garlan Fair, MD;  Location: [facility];  Service: Endoscopy;  Laterality: N/A;   mini facelift  2000   NASAL SINUS SURGERY     REDUCTION MAMMAPLASTY     ROTATOR CUFF REPAIR     Right   ROTATOR CUFF  REPAIR     Left   SEPTOPLASTY WITH ETHMOIDECTOMY, AND MAXILLARY ANTROSTOMY Bilateral 2006   TOTAL ABDOMINAL HYSTERECTOMY     ovaries remain   TUBAL LIGATION     umbicial hernia     Social History:  reports that she has never smoked. She has never used smokeless tobacco. She reports current alcohol use of about 2.0 standard drinks per week. She reports that she does not use drugs. Family History:  Family History  Problem Relation Age of Onset   Breast cancer Maternal Grandmother    Stroke Father    Hypertension Father    Cancer Father      HOME MEDICATIONS: Allergies as of 10/04/2021       Reactions   Adhesive [tape] Dermatitis   Citrus Nausea And Vomiting   Escitalopram Oxalate Hives   Niacin And Related    Rash and breathing breathing problems   Amoxicillin Rash   Unsure of reaction/ was instructed not to take drug. Unknown reaction        Medication List        Accurate as of October 04, 2021  1:46 PM. If you have any questions, ask your nurse or doctor.          acyclovir 200 MG capsule Commonly known as: ZOVIRAX Take 1 capsule (200 mg total) by mouth 2 (two) times daily. Take as needed for symptoms.   acyclovir ointment 5 % Commonly known as: ZOVIRAX Apply 1 application topically daily as needed (for fever blister).   albuterol 108 (90 Base) MCG/ACT inhaler Commonly known as: VENTOLIN HFA Inhale 2 puffs into the lungs every 6 (six) hours as needed for wheezing or shortness of breath.   azelastine 0.1 % nasal spray Commonly known as: ASTELIN Use in each nostril as directed   cetirizine 10 MG tablet Commonly known as: ZYRTEC Take 10 mg by mouth daily.   diclofenac sodium 1 % Gel Commonly known as: VOLTAREN Apply 2 g topically 3 (three) times daily as needed.   donepezil 5 MG tablet Commonly known as: ARICEPT Take 1 tablet (5 mg total) by mouth 2 (two) times daily.   estradiol 0.5 MG tablet Commonly known as: ESTRACE Take 0.5 mg by mouth  daily. Taking half/day so taking 0.25mg    FISH OIL PO Take 1,400 mg by mouth daily.   fluticasone 50 MCG/ACT nasal spray Commonly known as: FLONASE Place 1 spray into both nostrils daily.   levothyroxine 75 MCG tablet Commonly known as: SYNTHROID Take 1 tablet (75 mcg total) by mouth daily.   Lidocaine (Anorectal) 5 % Crea Apply topically in the morning, at noon, in the evening, and at bedtime.   memantine 7 MG Cp24 24 hr capsule Commonly known as: NAMENDA XR Take 7 mg by mouth daily.   multivitamin with minerals Tabs tablet Take 1 tablet by mouth daily.   polyethylene glycol 17 g packet Commonly known as: MIRALAX / GLYCOLAX Take 17 g by mouth daily.  Praluent 75 MG/ML Soaj Generic drug: Alirocumab Inject 75 mg into the skin every 14 (fourteen) days.   Sertraline HCl 150 MG Caps Take 1 capsule by mouth daily. What changed: Another medication with the same name was removed. Continue taking this medication, and follow the directions you see here. Changed by: Dorita Sciara, MD   TGT PSYLLIUM FIBER PO Take 4 capsules by mouth daily.   valACYclovir 500 MG tablet Commonly known as: VALTREX Take 250 mg by mouth 2 (two) times daily as needed (Out breaks Fever blisters).   zaleplon 10 MG capsule Commonly known as: SONATA Take 10 mg by mouth at bedtime as needed.          OBJECTIVE:   PHYSICAL EXAM: VS: BP 116/74 (BP Location: Left Arm, Patient Position: Sitting, Cuff Size: Small)   Pulse 60   Ht 5\' 3"  (1.6 m)   Wt 140 lb 12.8 oz (63.9 kg)   SpO2 98%   BMI 24.94 kg/m    EXAM: General: Pt appears well and is in NAD  Neck: General: Supple without adenopathy. Thyroid: Thyroid size normal.  No goiter or nodules appreciated.   Lungs: Clear with good BS bilat with no rales, rhonchi, or wheezes  Heart: Auscultation: RRR.  Abdomen: Normoactive bowel sounds, soft, nontender, without masses or organomegaly palpable  Extremities:  BL LE: No pretibial edema  normal ROM and strength.  Mental Status: Judgment, insight: Intact Orientation: Oriented to time, place, and person Mood and affect: No depression, anxiety, or agitation     DATA REVIEWED:  Results for Wendy, Joyce (MRN [id number]) as of 10/05/2021 07:45  Ref. Range 10/04/2021 14:05  TSH Latest Ref Range: 0.35 - 5.50 uIU/mL 2.56   Results for Wendy, Joyce (MRN [id number]) as of 03/31/2021 12:51  Ref. Range 12/31/2020 11:21  Thyroperoxidase Ab SerPl-aCnc Latest Ref Range: <9 IU/mL 75 (H)    ASSESSMENT / PLAN / RECOMMENDATIONS:   Hashimoto's thyroiditis  - Pt is clinically euthyroid  - No local neck symptoms - Taking Synthroid appropriately.  -Repeat TSH  is normal   Medications   Continue Synthroid 75 MCG daily   2. Weight gain:  - This has been frustrating to her as she has gained 6 lbs since 03/2021 , she attributes this to reducing levothyroxine dose , we discussed cardiovascular and increased bone resorption with iatrogenic hyperthyroidism - Will refer to healthy weight and wellness as she would like help with this      F/U in 6 months    Signed electronically by: Mack Guise, MD  [facility]  [facility] [address] [place], [place] [address] Phone: [phone] FAX: [phone]      CC: Fanny Bien, MD [address] [place] [address] Phone: [phone]  Fax: [phone]   Return to Endocrinology clinic as below: No future appointments.

## 2021-10-05 DIAGNOSIS — F4312 Post-traumatic stress disorder, chronic: Secondary | ICD-10-CM | POA: Diagnosis not present

## 2021-10-05 DIAGNOSIS — F330 Major depressive disorder, recurrent, mild: Secondary | ICD-10-CM | POA: Diagnosis not present

## 2021-10-05 MED ORDER — LEVOTHYROXINE SODIUM 75 MCG PO TABS: 75.0000 ug | ORAL_TABLET | Freq: Every day | ORAL | 3 refills | Status: DC

## 2021-10-06 DIAGNOSIS — H53483 Generalized contraction of visual field, bilateral: Secondary | ICD-10-CM | POA: Diagnosis not present

## 2021-10-12 DIAGNOSIS — F4312 Post-traumatic stress disorder, chronic: Secondary | ICD-10-CM | POA: Diagnosis not present

## 2021-10-12 DIAGNOSIS — F330 Major depressive disorder, recurrent, mild: Secondary | ICD-10-CM | POA: Diagnosis not present

## 2021-10-18 DIAGNOSIS — F411 Generalized anxiety disorder: Secondary | ICD-10-CM | POA: Diagnosis not present

## 2021-10-18 DIAGNOSIS — G4700 Insomnia, unspecified: Secondary | ICD-10-CM | POA: Diagnosis not present

## 2021-10-18 DIAGNOSIS — Z7185 Encounter for immunization safety counseling: Secondary | ICD-10-CM | POA: Diagnosis not present

## 2021-10-18 DIAGNOSIS — F331 Major depressive disorder, recurrent, moderate: Secondary | ICD-10-CM | POA: Diagnosis not present

## 2021-10-18 DIAGNOSIS — J309 Allergic rhinitis, unspecified: Secondary | ICD-10-CM | POA: Diagnosis not present

## 2021-10-18 DIAGNOSIS — F0390 Unspecified dementia without behavioral disturbance: Secondary | ICD-10-CM | POA: Diagnosis not present

## 2021-10-19 DIAGNOSIS — F4312 Post-traumatic stress disorder, chronic: Secondary | ICD-10-CM | POA: Diagnosis not present

## 2021-10-19 DIAGNOSIS — F330 Major depressive disorder, recurrent, mild: Secondary | ICD-10-CM | POA: Diagnosis not present

## 2021-11-02 DIAGNOSIS — F330 Major depressive disorder, recurrent, mild: Secondary | ICD-10-CM | POA: Diagnosis not present

## 2021-11-02 DIAGNOSIS — F4312 Post-traumatic stress disorder, chronic: Secondary | ICD-10-CM | POA: Diagnosis not present

## 2021-11-08 DIAGNOSIS — R5383 Other fatigue: Secondary | ICD-10-CM | POA: Diagnosis not present

## 2021-11-08 DIAGNOSIS — E782 Mixed hyperlipidemia: Secondary | ICD-10-CM | POA: Diagnosis not present

## 2021-11-08 DIAGNOSIS — E039 Hypothyroidism, unspecified: Secondary | ICD-10-CM | POA: Diagnosis not present

## 2021-11-09 DIAGNOSIS — F330 Major depressive disorder, recurrent, mild: Secondary | ICD-10-CM | POA: Diagnosis not present

## 2021-11-09 DIAGNOSIS — F4312 Post-traumatic stress disorder, chronic: Secondary | ICD-10-CM | POA: Diagnosis not present

## 2021-11-23 DIAGNOSIS — F4312 Post-traumatic stress disorder, chronic: Secondary | ICD-10-CM | POA: Diagnosis not present

## 2021-11-23 DIAGNOSIS — F330 Major depressive disorder, recurrent, mild: Secondary | ICD-10-CM | POA: Diagnosis not present

## 2021-12-01 DIAGNOSIS — M546 Pain in thoracic spine: Secondary | ICD-10-CM | POA: Diagnosis not present

## 2021-12-01 DIAGNOSIS — J069 Acute upper respiratory infection, unspecified: Secondary | ICD-10-CM | POA: Diagnosis not present

## 2021-12-01 DIAGNOSIS — J329 Chronic sinusitis, unspecified: Secondary | ICD-10-CM | POA: Diagnosis not present

## 2021-12-01 DIAGNOSIS — B9689 Other specified bacterial agents as the cause of diseases classified elsewhere: Secondary | ICD-10-CM | POA: Diagnosis not present

## 2021-12-31 ENCOUNTER — Other Ambulatory Visit (HOSPITAL_BASED_OUTPATIENT_CLINIC_OR_DEPARTMENT_OTHER): Payer: Self-pay

## 2022-01-03 ENCOUNTER — Other Ambulatory Visit (HOSPITAL_BASED_OUTPATIENT_CLINIC_OR_DEPARTMENT_OTHER): Payer: Self-pay

## 2022-01-04 ENCOUNTER — Other Ambulatory Visit (HOSPITAL_BASED_OUTPATIENT_CLINIC_OR_DEPARTMENT_OTHER): Payer: Self-pay

## 2022-01-04 DIAGNOSIS — F4312 Post-traumatic stress disorder, chronic: Secondary | ICD-10-CM | POA: Diagnosis not present

## 2022-01-04 DIAGNOSIS — F330 Major depressive disorder, recurrent, mild: Secondary | ICD-10-CM | POA: Diagnosis not present

## 2022-01-04 MED ORDER — PRALUENT 75 MG/ML ~~LOC~~ SOAJ
75.0000 mg | SUBCUTANEOUS | 1 refills | Status: DC
  Filled 2022-01-05: qty 6, 84d supply, fill #0

## 2022-01-04 MED ORDER — BUPROPION HCL ER (XL) 150 MG PO TB24
150.0000 mg | ORAL_TABLET | ORAL | 2 refills | Status: DC
  Filled 2022-01-05: qty 90, 90d supply, fill #0

## 2022-01-04 MED ORDER — MONTELUKAST SODIUM 10 MG PO TABS
10.0000 mg | ORAL_TABLET | Freq: Every day | ORAL | 0 refills | Status: DC
  Filled 2022-01-05 – 2022-01-31 (×2): qty 90, 90d supply, fill #0

## 2022-01-04 MED ORDER — ESTRADIOL 0.5 MG PO TABS
0.5000 mg | ORAL_TABLET | ORAL | 3 refills | Status: DC
  Filled 2022-01-05 – 2022-04-01 (×3): qty 90, 90d supply, fill #0
  Filled 2022-06-27: qty 90, 90d supply, fill #1
  Filled 2022-09-26: qty 90, 90d supply, fill #2

## 2022-01-05 ENCOUNTER — Other Ambulatory Visit (HOSPITAL_BASED_OUTPATIENT_CLINIC_OR_DEPARTMENT_OTHER): Payer: Self-pay

## 2022-01-06 ENCOUNTER — Other Ambulatory Visit (HOSPITAL_BASED_OUTPATIENT_CLINIC_OR_DEPARTMENT_OTHER): Payer: Self-pay

## 2022-01-06 DIAGNOSIS — R946 Abnormal results of thyroid function studies: Secondary | ICD-10-CM | POA: Diagnosis not present

## 2022-01-06 DIAGNOSIS — E782 Mixed hyperlipidemia: Secondary | ICD-10-CM | POA: Diagnosis not present

## 2022-01-12 ENCOUNTER — Other Ambulatory Visit (HOSPITAL_BASED_OUTPATIENT_CLINIC_OR_DEPARTMENT_OTHER): Payer: Self-pay

## 2022-01-12 MED ORDER — PRALUENT 150 MG/ML ~~LOC~~ SOAJ: SUBCUTANEOUS | 0 refills | Status: DC

## 2022-01-14 ENCOUNTER — Other Ambulatory Visit (HOSPITAL_BASED_OUTPATIENT_CLINIC_OR_DEPARTMENT_OTHER): Payer: Self-pay

## 2022-01-14 MED ORDER — DONEPEZIL HCL 5 MG PO TABS
ORAL_TABLET | ORAL | 1 refills | Status: DC
  Filled 2022-01-14 – 2022-03-07 (×2): qty 180, 90d supply, fill #0
  Filled 2022-04-01 – 2022-06-09 (×2): qty 180, 90d supply, fill #1

## 2022-01-18 DIAGNOSIS — F330 Major depressive disorder, recurrent, mild: Secondary | ICD-10-CM | POA: Diagnosis not present

## 2022-01-18 DIAGNOSIS — F4312 Post-traumatic stress disorder, chronic: Secondary | ICD-10-CM | POA: Diagnosis not present

## 2022-01-24 ENCOUNTER — Other Ambulatory Visit (HOSPITAL_BASED_OUTPATIENT_CLINIC_OR_DEPARTMENT_OTHER): Payer: Self-pay

## 2022-01-24 DIAGNOSIS — G4700 Insomnia, unspecified: Secondary | ICD-10-CM | POA: Diagnosis not present

## 2022-01-24 DIAGNOSIS — F411 Generalized anxiety disorder: Secondary | ICD-10-CM | POA: Diagnosis not present

## 2022-01-24 DIAGNOSIS — E782 Mixed hyperlipidemia: Secondary | ICD-10-CM | POA: Diagnosis not present

## 2022-01-24 DIAGNOSIS — E039 Hypothyroidism, unspecified: Secondary | ICD-10-CM | POA: Diagnosis not present

## 2022-01-24 MED ORDER — ALPRAZOLAM 0.25 MG PO TABS
ORAL_TABLET | ORAL | 5 refills | Status: DC
  Filled 2022-01-24: qty 45, 8d supply, fill #0
  Filled 2022-02-02: qty 45, 8d supply, fill #1
  Filled 2022-03-31: qty 45, 12d supply, fill #2

## 2022-01-24 MED ORDER — SERTRALINE HCL 100 MG PO TABS
ORAL_TABLET | ORAL | 1 refills | Status: DC
  Filled 2022-01-24: qty 180, 90d supply, fill #0

## 2022-01-24 MED ORDER — REPATHA SURECLICK 140 MG/ML ~~LOC~~ SOAJ
SUBCUTANEOUS | 11 refills | Status: DC
  Filled 2022-01-24: qty 2, 28d supply, fill #0
  Filled 2022-02-22: qty 2, 28d supply, fill #1

## 2022-01-24 MED ORDER — LEVOTHYROXINE SODIUM 88 MCG PO TABS
ORAL_TABLET | ORAL | 3 refills | Status: DC
  Filled 2022-01-24: qty 90, 90d supply, fill #0

## 2022-01-25 ENCOUNTER — Other Ambulatory Visit (HOSPITAL_BASED_OUTPATIENT_CLINIC_OR_DEPARTMENT_OTHER): Payer: Self-pay

## 2022-01-25 DIAGNOSIS — F330 Major depressive disorder, recurrent, mild: Secondary | ICD-10-CM | POA: Diagnosis not present

## 2022-01-25 DIAGNOSIS — F4312 Post-traumatic stress disorder, chronic: Secondary | ICD-10-CM | POA: Diagnosis not present

## 2022-01-26 ENCOUNTER — Other Ambulatory Visit (HOSPITAL_BASED_OUTPATIENT_CLINIC_OR_DEPARTMENT_OTHER): Payer: Self-pay

## 2022-01-31 ENCOUNTER — Other Ambulatory Visit (HOSPITAL_BASED_OUTPATIENT_CLINIC_OR_DEPARTMENT_OTHER): Payer: Self-pay

## 2022-02-01 DIAGNOSIS — F4312 Post-traumatic stress disorder, chronic: Secondary | ICD-10-CM | POA: Diagnosis not present

## 2022-02-01 DIAGNOSIS — F330 Major depressive disorder, recurrent, mild: Secondary | ICD-10-CM | POA: Diagnosis not present

## 2022-02-02 ENCOUNTER — Other Ambulatory Visit (HOSPITAL_BASED_OUTPATIENT_CLINIC_OR_DEPARTMENT_OTHER): Payer: Self-pay

## 2022-02-08 DIAGNOSIS — F330 Major depressive disorder, recurrent, mild: Secondary | ICD-10-CM | POA: Diagnosis not present

## 2022-02-08 DIAGNOSIS — F4312 Post-traumatic stress disorder, chronic: Secondary | ICD-10-CM | POA: Diagnosis not present

## 2022-02-16 IMAGING — MG MM DIGITAL SCREENING BILAT W/ TOMO AND CAD
8 series · 8 of 24 positions shown · non-contrast
Comparison: Previous exam(s).

CLINICAL DATA: Screening.

EXAM:
DIGITAL SCREENING BILATERAL MAMMOGRAM WITH TOMOSYNTHESIS AND CAD
TECHNIQUE: Bilateral screening digital craniocaudal and mediolateral oblique
mammograms were obtained. Bilateral screening digital breast
tomosynthesis was performed. The images were evaluated with
computer-aided detection.

[L MLO synth-2D]
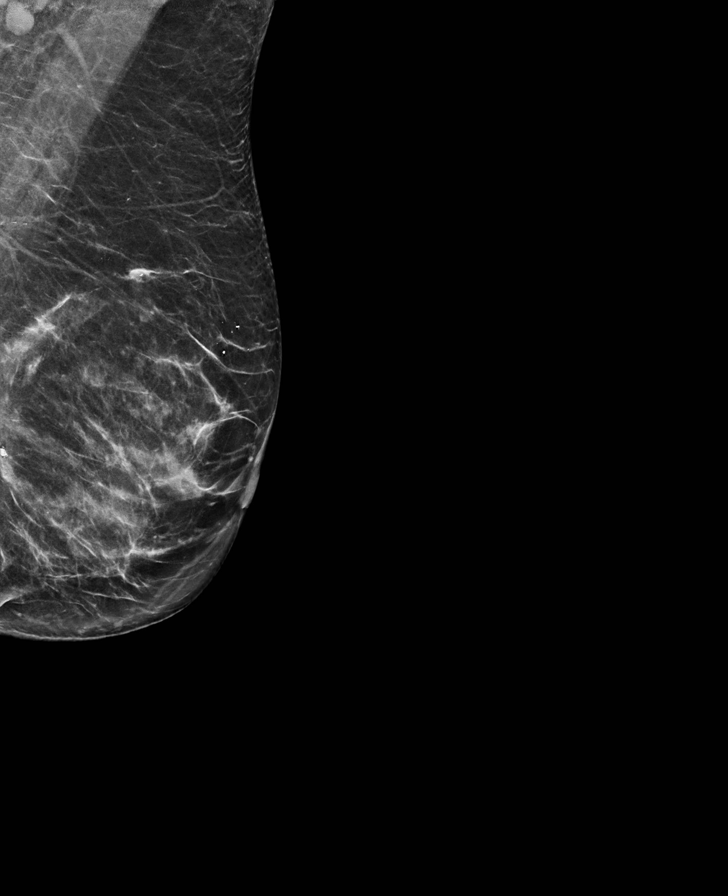

[L CC synth-2D]
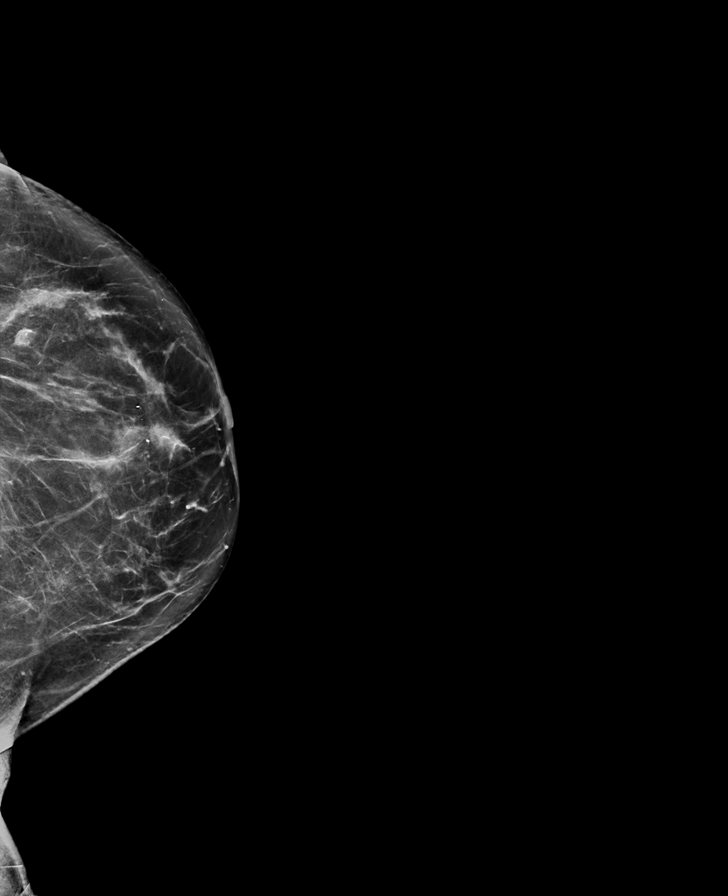

[R CC synth-2D]
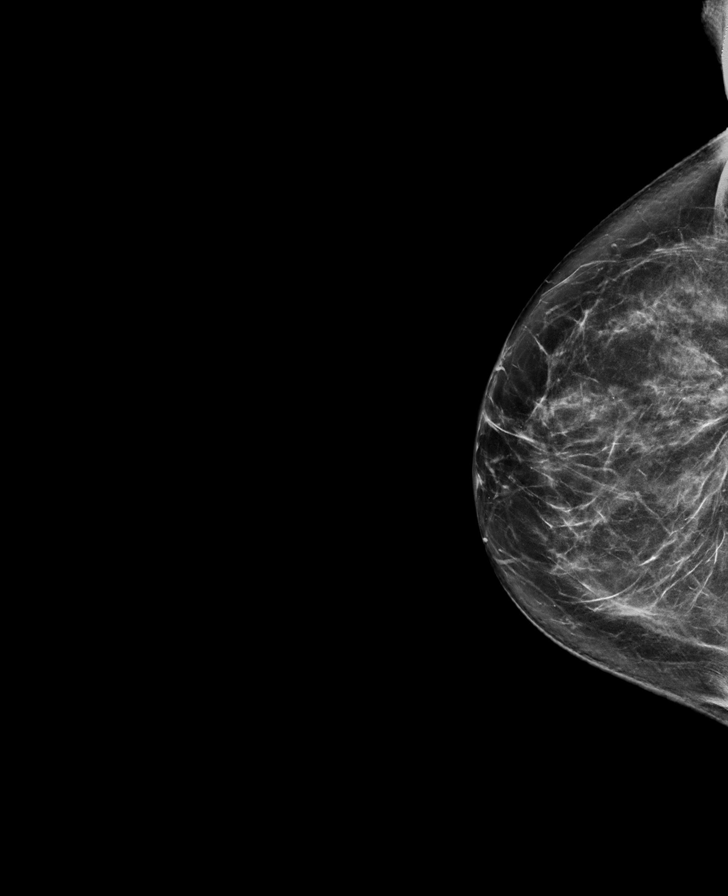

[R MLO synth-2D]
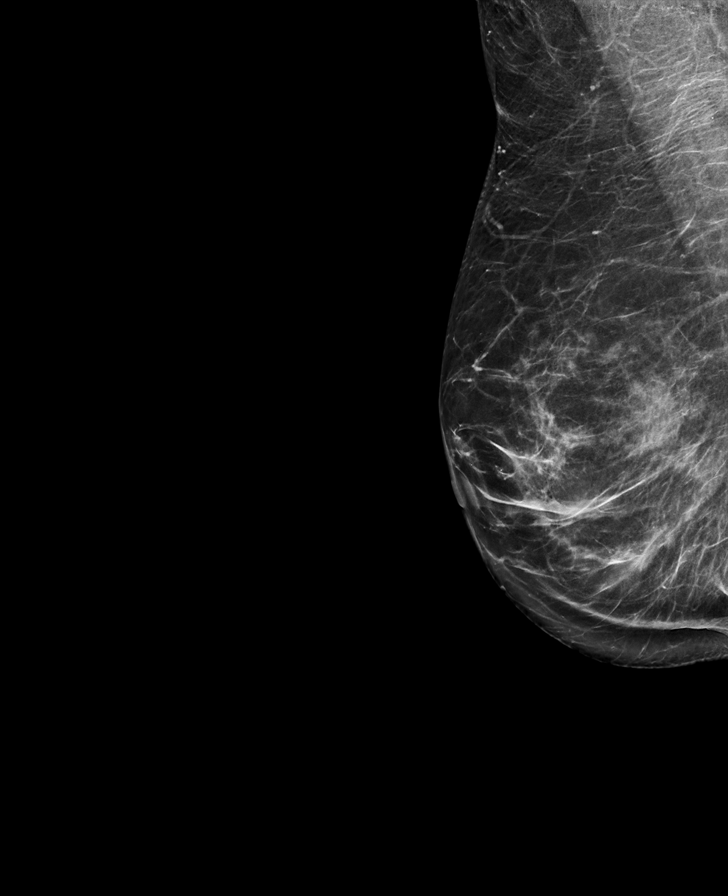

[L MLO tomo · tomo slice 33/66.0]
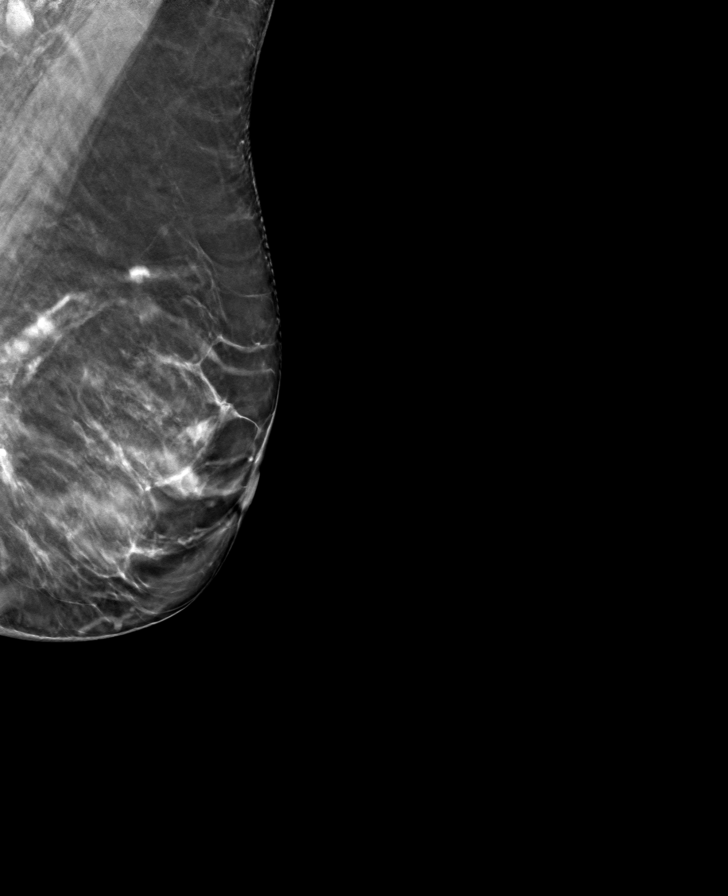

[R MLO tomo · tomo slice 37/74.0]
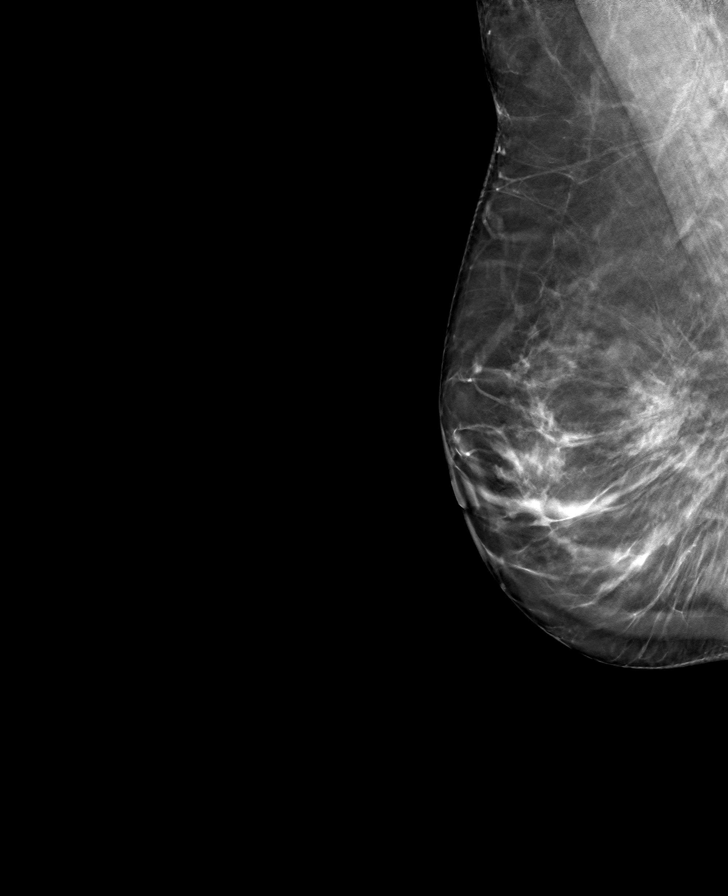

[L CC tomo · tomo slice 37/72.0]
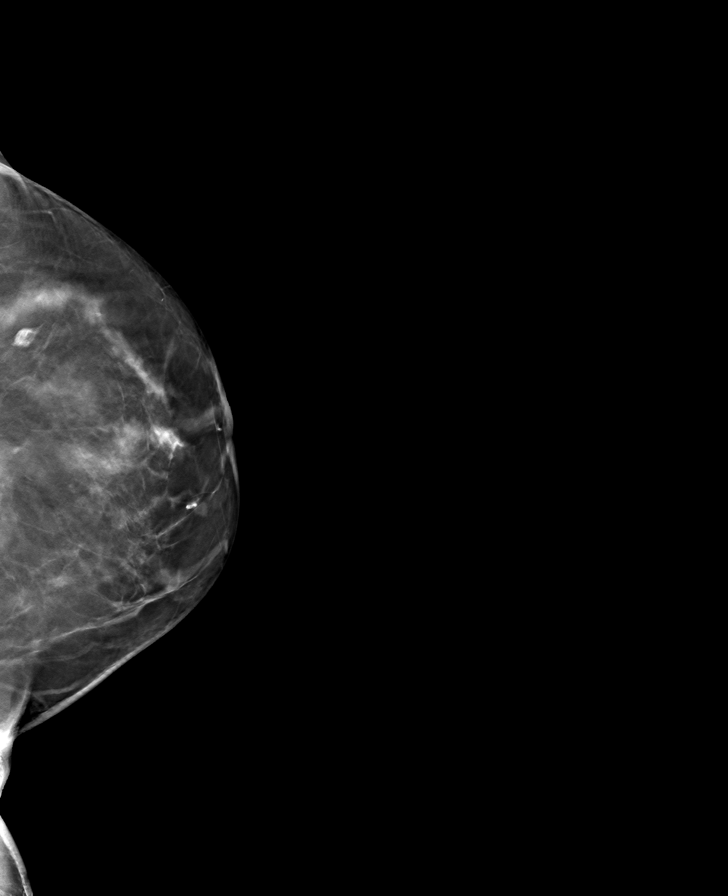

[R CC tomo · tomo slice 39/76.0]
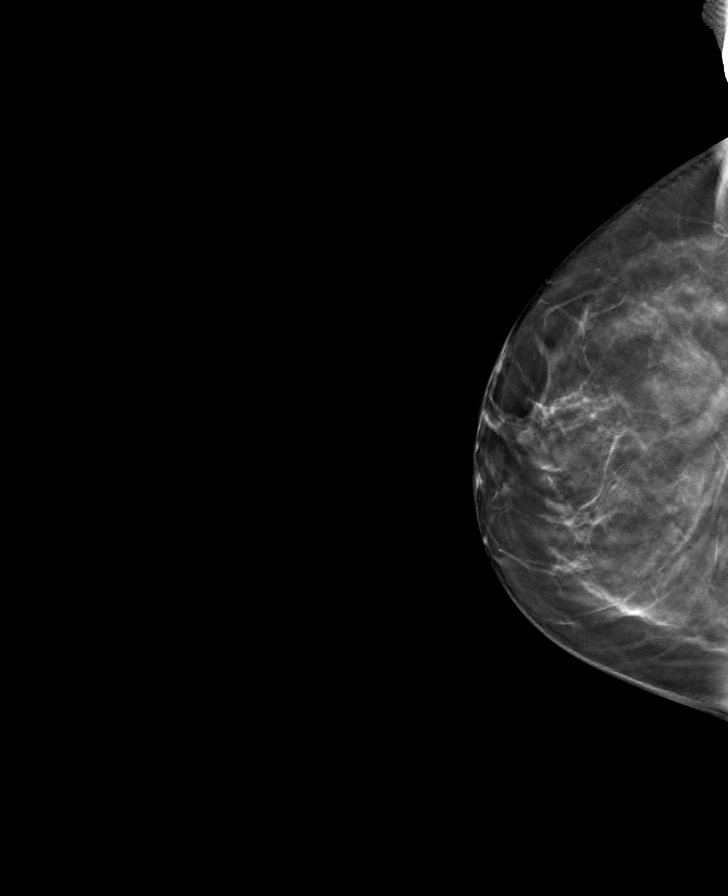

[8 of 24 positions shown; findings below may reference images not displayed]

ACR Breast Density Category b: There are scattered areas of
fibroglandular density.
FINDINGS: There are no findings suspicious for malignancy.
IMPRESSION: No mammographic evidence of malignancy. A result letter of this
screening mammogram will be mailed directly to the patient.

RECOMMENDATION:
Screening mammogram in one year. (Code:51-O-LD2)

BI-RADS CATEGORY  1: Negative.

## 2022-02-22 ENCOUNTER — Other Ambulatory Visit (HOSPITAL_BASED_OUTPATIENT_CLINIC_OR_DEPARTMENT_OTHER): Payer: Self-pay

## 2022-02-22 DIAGNOSIS — F4312 Post-traumatic stress disorder, chronic: Secondary | ICD-10-CM | POA: Diagnosis not present

## 2022-02-22 DIAGNOSIS — F330 Major depressive disorder, recurrent, mild: Secondary | ICD-10-CM | POA: Diagnosis not present

## 2022-03-01 ENCOUNTER — Other Ambulatory Visit (HOSPITAL_BASED_OUTPATIENT_CLINIC_OR_DEPARTMENT_OTHER): Payer: Self-pay

## 2022-03-01 DIAGNOSIS — F4312 Post-traumatic stress disorder, chronic: Secondary | ICD-10-CM | POA: Diagnosis not present

## 2022-03-01 DIAGNOSIS — F330 Major depressive disorder, recurrent, mild: Secondary | ICD-10-CM | POA: Diagnosis not present

## 2022-03-01 MED ORDER — NEOMYCIN-POLYMYXIN-DEXAMETH 3.5-10000-0.1 OP OINT
TOPICAL_OINTMENT | OPHTHALMIC | 0 refills | Status: DC
  Filled 2022-03-01: qty 3.5, 7d supply, fill #0

## 2022-03-02 ENCOUNTER — Other Ambulatory Visit (HOSPITAL_BASED_OUTPATIENT_CLINIC_OR_DEPARTMENT_OTHER): Payer: Self-pay

## 2022-03-02 MED ORDER — DIAZEPAM 5 MG PO TABS
ORAL_TABLET | ORAL | 0 refills | Status: DC
  Filled 2022-03-02: qty 2, 1d supply, fill #0

## 2022-03-02 MED ORDER — LORAZEPAM 1 MG PO TABS
ORAL_TABLET | ORAL | 0 refills | Status: DC
  Filled 2022-03-02: qty 2, 1d supply, fill #0

## 2022-03-03 ENCOUNTER — Other Ambulatory Visit (HOSPITAL_BASED_OUTPATIENT_CLINIC_OR_DEPARTMENT_OTHER): Payer: Self-pay

## 2022-03-03 MED ORDER — PRAZOSIN HCL 2 MG PO CAPS
ORAL_CAPSULE | ORAL | 5 refills | Status: DC
  Filled 2022-03-03: qty 90, 30d supply, fill #0
  Filled 2022-03-28 – 2022-04-01 (×2): qty 90, 30d supply, fill #1
  Filled 2022-06-14: qty 90, 30d supply, fill #2
  Filled 2022-07-11: qty 90, 30d supply, fill #3
  Filled 2022-08-16: qty 90, 30d supply, fill #4

## 2022-03-03 MED ORDER — AUVELITY 45-105 MG PO TBCR
EXTENDED_RELEASE_TABLET | ORAL | 1 refills | Status: DC
  Filled 2022-03-03: qty 30, 30d supply, fill #0
  Filled 2022-03-28 – 2022-04-01 (×2): qty 30, 30d supply, fill #1

## 2022-03-04 ENCOUNTER — Other Ambulatory Visit (HOSPITAL_BASED_OUTPATIENT_CLINIC_OR_DEPARTMENT_OTHER): Payer: Self-pay

## 2022-03-07 ENCOUNTER — Other Ambulatory Visit (HOSPITAL_BASED_OUTPATIENT_CLINIC_OR_DEPARTMENT_OTHER): Payer: Self-pay

## 2022-03-08 DIAGNOSIS — H02413 Mechanical ptosis of bilateral eyelids: Secondary | ICD-10-CM | POA: Diagnosis not present

## 2022-03-08 DIAGNOSIS — H02423 Myogenic ptosis of bilateral eyelids: Secondary | ICD-10-CM | POA: Diagnosis not present

## 2022-03-17 ENCOUNTER — Other Ambulatory Visit: Payer: Self-pay

## 2022-03-17 ENCOUNTER — Ambulatory Visit (INDEPENDENT_AMBULATORY_CARE_PROVIDER_SITE_OTHER): Payer: Medicare Other | Admitting: Nurse Practitioner

## 2022-03-17 ENCOUNTER — Encounter (HOSPITAL_BASED_OUTPATIENT_CLINIC_OR_DEPARTMENT_OTHER): Payer: Self-pay | Admitting: Nurse Practitioner

## 2022-03-17 VITALS — BP 117/72 | HR 72 | Temp 98.4°F | Ht 64.0 in | Wt 144.4 lb

## 2022-03-17 DIAGNOSIS — R2689 Other abnormalities of gait and mobility: Secondary | ICD-10-CM | POA: Insufficient documentation

## 2022-03-17 DIAGNOSIS — E038 Other specified hypothyroidism: Secondary | ICD-10-CM | POA: Diagnosis not present

## 2022-03-17 DIAGNOSIS — Z789 Other specified health status: Secondary | ICD-10-CM | POA: Insufficient documentation

## 2022-03-17 DIAGNOSIS — B001 Herpesviral vesicular dermatitis: Secondary | ICD-10-CM | POA: Insufficient documentation

## 2022-03-17 DIAGNOSIS — Z8249 Family history of ischemic heart disease and other diseases of the circulatory system: Secondary | ICD-10-CM | POA: Insufficient documentation

## 2022-03-17 DIAGNOSIS — E559 Vitamin D deficiency, unspecified: Secondary | ICD-10-CM

## 2022-03-17 DIAGNOSIS — E7800 Pure hypercholesterolemia, unspecified: Secondary | ICD-10-CM | POA: Diagnosis not present

## 2022-03-17 DIAGNOSIS — K5900 Constipation, unspecified: Secondary | ICD-10-CM | POA: Insufficient documentation

## 2022-03-17 DIAGNOSIS — I10 Essential (primary) hypertension: Secondary | ICD-10-CM

## 2022-03-17 DIAGNOSIS — G720 Drug-induced myopathy: Secondary | ICD-10-CM | POA: Insufficient documentation

## 2022-03-17 DIAGNOSIS — L439 Lichen planus, unspecified: Secondary | ICD-10-CM | POA: Insufficient documentation

## 2022-03-17 DIAGNOSIS — F329 Major depressive disorder, single episode, unspecified: Secondary | ICD-10-CM | POA: Insufficient documentation

## 2022-03-17 DIAGNOSIS — R32 Unspecified urinary incontinence: Secondary | ICD-10-CM | POA: Insufficient documentation

## 2022-03-17 DIAGNOSIS — R0609 Other forms of dyspnea: Secondary | ICD-10-CM | POA: Insufficient documentation

## 2022-03-17 DIAGNOSIS — F909 Attention-deficit hyperactivity disorder, unspecified type: Secondary | ICD-10-CM | POA: Insufficient documentation

## 2022-03-17 DIAGNOSIS — R609 Edema, unspecified: Secondary | ICD-10-CM | POA: Insufficient documentation

## 2022-03-17 DIAGNOSIS — R42 Dizziness and giddiness: Secondary | ICD-10-CM

## 2022-03-17 DIAGNOSIS — T466X5A Adverse effect of antihyperlipidemic and antiarteriosclerotic drugs, initial encounter: Secondary | ICD-10-CM | POA: Insufficient documentation

## 2022-03-17 DIAGNOSIS — M793 Panniculitis, unspecified: Secondary | ICD-10-CM | POA: Insufficient documentation

## 2022-03-17 DIAGNOSIS — I831 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: Secondary | ICD-10-CM | POA: Insufficient documentation

## 2022-03-17 DIAGNOSIS — R531 Weakness: Secondary | ICD-10-CM

## 2022-03-17 DIAGNOSIS — S069X0D Unspecified intracranial injury without loss of consciousness, subsequent encounter: Secondary | ICD-10-CM | POA: Diagnosis not present

## 2022-03-17 DIAGNOSIS — M797 Fibromyalgia: Secondary | ICD-10-CM | POA: Diagnosis not present

## 2022-03-17 DIAGNOSIS — R350 Frequency of micturition: Secondary | ICD-10-CM | POA: Insufficient documentation

## 2022-03-17 DIAGNOSIS — E739 Lactose intolerance, unspecified: Secondary | ICD-10-CM | POA: Insufficient documentation

## 2022-03-17 DIAGNOSIS — I444 Left anterior fascicular block: Secondary | ICD-10-CM | POA: Insufficient documentation

## 2022-03-17 DIAGNOSIS — I8000 Phlebitis and thrombophlebitis of superficial vessels of unspecified lower extremity: Secondary | ICD-10-CM | POA: Insufficient documentation

## 2022-03-17 DIAGNOSIS — K219 Gastro-esophageal reflux disease without esophagitis: Secondary | ICD-10-CM | POA: Insufficient documentation

## 2022-03-17 DIAGNOSIS — K429 Umbilical hernia without obstruction or gangrene: Secondary | ICD-10-CM | POA: Insufficient documentation

## 2022-03-17 DIAGNOSIS — F419 Anxiety disorder, unspecified: Secondary | ICD-10-CM | POA: Insufficient documentation

## 2022-03-17 DIAGNOSIS — R37 Sexual dysfunction, unspecified: Secondary | ICD-10-CM | POA: Insufficient documentation

## 2022-03-17 DIAGNOSIS — L500 Allergic urticaria: Secondary | ICD-10-CM | POA: Insufficient documentation

## 2022-03-17 DIAGNOSIS — G4700 Insomnia, unspecified: Secondary | ICD-10-CM | POA: Insufficient documentation

## 2022-03-17 DIAGNOSIS — K589 Irritable 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: Secondary | ICD-10-CM | POA: Insufficient documentation

## 2022-03-17 DIAGNOSIS — H9313 Tinnitus, bilateral: Secondary | ICD-10-CM | POA: Insufficient documentation

## 2022-03-17 DIAGNOSIS — M545 Low back pain, unspecified: Secondary | ICD-10-CM | POA: Insufficient documentation

## 2022-03-17 DIAGNOSIS — G43919 Migraine, unspecified, intractable, without status migrainosus: Secondary | ICD-10-CM | POA: Insufficient documentation

## 2022-03-17 DIAGNOSIS — L989 Disorder of the skin and subcutaneous tissue, unspecified: Secondary | ICD-10-CM | POA: Insufficient documentation

## 2022-03-17 DIAGNOSIS — G4710 Hypersomnia, unspecified: Secondary | ICD-10-CM | POA: Insufficient documentation

## 2022-03-17 DIAGNOSIS — Z0000 Encounter for general adult medical examination without abnormal findings: Secondary | ICD-10-CM

## 2022-03-17 DIAGNOSIS — J309 Allergic rhinitis, unspecified: Secondary | ICD-10-CM | POA: Insufficient documentation

## 2022-03-17 DIAGNOSIS — M779 Enthesopathy, unspecified: Secondary | ICD-10-CM | POA: Insufficient documentation

## 2022-03-17 DIAGNOSIS — I2510 Atherosclerotic heart disease of native coronary artery without angina pectoris: Secondary | ICD-10-CM

## 2022-03-17 DIAGNOSIS — E063 Autoimmune thyroiditis: Secondary | ICD-10-CM

## 2022-03-17 NOTE — Progress Notes (Signed)
Tollie Eth, DNP, AGNP-c [facility] [address]  [address] [place], [place] [address] [phone]  New patient visit   Patient: Wendy Joyce   DOB: Jul 18, 1946   76 y.o. Female  MRN: [id number] Visit Date: 03/17/2022  Patient Care Team: Alyze Lauf, Sung Amabile, NP as PCP - General (Nurse Practitioner) Jake Bathe, MD as PCP - Cardiology (Cardiology)  Today's healthcare provider: Tollie Eth, NP   Chief Complaint  Patient presents with   New Patient (Initial Visit)    Establish care, feels that her gait is off since fall in 2021.   Subjective    Wendy Joyce is a 76 y.o. female who presents today as a new patient to establish care.    Patient endorses the following concerns presently:  Gait instability/Fall She reports when standing still or when making a turn at times she will have an "uneasy feeling" like she may fall.  She reports it feels like she might tip over, but no vertigo-like symptoms.   She did have a fall in 2021 when she lost consciousness upon standing. She blacked out and hit her head on a marble floor that resulted in a concussion.  After the fall she woke up quickly.  She was able to stand and walk immediately after.  She had testing on her heart and neuro system and everything checked out ok aside from the concussion.  At the time she was on the keto diet and hadn't been eating until noon. She had not eaten that morning when this occurred.  She was followed by neurology and completed PT- she has completed this and has not been back to see neurology.  She does still have difficulty findings words and remembering since the fall and relates this to the concussion.  She is not having dizziness, headaches, vision changes, vertigo, nausea, vomiting, or additional falls.   She works out with one of the trainers at [employer] She tells me when she has to do activities on the floor and then gets up she gets the  feeling again that she may pass out.   She tells me she drinks one glass of water in the morning, then a cup of coffee and usually sips on water throughout the day, but admits she does not drink enough water.  She reports that she feels thirsty often.  She tells me her urine is usually yellow in color.   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 She is using this infrequently. She feels this is well controlled.  She denies wheezing, shortness of breath, difficulty catching her breath, or cough.   Menopausal Symptoms She tells me that she gets very hot and hot flashes intermittently for varying lengths of time.  She has also had night sweats in the past.  She tells me she did not have these symptoms at the time of menopause, but they have been occurring in the last year.  She feels like these are related to the change in her thryoid medications. She tells me when her thyroid levels were very low based on labs she felt her best without weight issues and hot flashes.  She is having blood work in the morning for her thyroid and cholesterol.  She reports that she feels much better when her TSH is very low.   History reviewed and reveals the following: Past Medical History:  Diagnosis Date   Abdominal pain, generalized 01/29/2015   Abnormal thyroid function test  02/25/2016   ADHD (attention deficit hyperactivity disorder)    Allergic rhinitis, unspecified 01/29/2015   Ankle sprain 03/16/2016   Anxiety 01/29/2015   Arthritis    hands   Chronic kidney disease 2012   kidney stones   Constipation 01/29/2015   Decreased libido 05/02/2018   Disorder of the skin and subcutaneous tissue, unspecified 01/29/2015   DJD (degenerative joint disease)    hand   Dysthymia    Endometriosis    External otitis    Family history of coronary artery disease 05/02/2018   Fatigue due to excessive exertion 02/24/2016   Fever blister    Fibromyalgia 01/29/2015   Frequency of micturition 01/29/2015    Functional dyspepsia 01/29/2015   Gastro-esophageal reflux disease without esophagitis 01/29/2015   Headache(784.0)    High cholesterol    Hyperlipidemia 09/12/2016   Hypersomnia    Hypothyroidism    IBS (irritable bowel syndrome) 05/02/2018   Insomnia    Internal hemorrhoids    Irritable 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 01/29/2015   Labial pain 11/30/2016   Lactose intolerance 12/14/2016   Lactose intolerance in adult 09/12/2016   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   Left ureteral stone    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   Major depressive disorder, single episode, unspecified    Migraine with aura, not intractable, without status migrainosus    Mixed hyperlipidemia    Mouth pain 01/07/2016   Osteoarthritis of hip    Osteopenia determined by x-ray 03/02/2017   Personal history of estrogen therapy    Phlebitis and thrombophlebitis of the leg    Pityriasis alba 01/29/2015   Pure hypercholesterolemia 06/29/2016   Rectum pain 09/12/2016   Recurrent sinus infections 02/24/2016   Rhinosinusitis 01/07/2016   Rosacea    Sinusitis 12/24/2015   Tinnitus of left ear 01/29/2015   Umbilical hernia without obstruction and without gangrene 01/29/2015   Umbilical pain 05/02/2018   Unspecified urinary incontinence 01/07/2016   Urticaria    UTI (urinary tract infection)    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   Vitamin D deficiency    Past Surgical History:  Procedure Laterality Date   BREAST ENHANCEMENT SURGERY     BREAST REDUCTION SURGERY Bilateral 2018   COLONOSCOPY WITH PROPOFOL N/A 02/25/2013   Procedure: COLONOSCOPY WITH PROPOFOL;  Surgeon: Charolett Bumpers, MD;  Location: [facility];  Service: Endoscopy;  Laterality: N/A;   mini facelift  2000   NASAL SINUS SURGERY     REDUCTION MAMMAPLASTY     ROTATOR CUFF REPAIR     Right   ROTATOR CUFF REPAIR     Left   SEPTOPLASTY WITH ETHMOIDECTOMY, AND MAXILLARY ANTROSTOMY Bilateral 2006    TOTAL ABDOMINAL HYSTERECTOMY     ovaries remain   TUBAL LIGATION     umbicial hernia     Family Status  Relation Name Status   MGM aunt (Not Specified)   Father  (Not Specified)   Family History  Problem Relation Age of Onset   Breast cancer Maternal Grandmother    Stroke Father    Hypertension Father    Cancer Father    Social History   Socioeconomic History   Marital status: Married    Spouse name: Not on file   Number of children: Not on file   Years of education: Not on file   Highest education level: Not on file  Occupational History   Not on file  Tobacco Use   Smoking status: Never  Smokeless tobacco: Never  Vaping Use   Vaping Use: Not on file  Substance and Sexual Activity   Alcohol use: Yes    Alcohol/week: 2.0 standard drinks    Types: 2 Glasses of wine per week    Comment: every night   Drug use: No   Sexual activity: Yes    Partners: Male  Other Topics Concern   Not on file  Social History Narrative   Married   Regular exercise: yes; 2 x a week with a trainer   Caffeine use: coffee daily   Social Determinants of Corporate investment banker Strain: Not on file  Food Insecurity: Not on file  Transportation Needs: Not on file  Physical Activity: Not on file  Stress: Not on file  Social Connections: Not on file   Outpatient Medications Prior to Visit  Medication Sig   acyclovir (ZOVIRAX) 200 MG capsule Take 1 capsule (200 mg total) by mouth 2 (two) times daily. Take as needed for symptoms.   acyclovir ointment (ZOVIRAX) 5 % Apply 1 application topically daily as needed (for fever blister).   albuterol (VENTOLIN HFA) 108 (90 Base) MCG/ACT inhaler Inhale 2 puffs into the lungs every 6 (six) hours as needed for wheezing or shortness of breath.   ALPRAZolam (XANAX) 0.25 MG tablet Take 1-2  tablets at bedtime and up to 3 times a day when needed for panic attacks   azelastine (ASTELIN) 0.1 % nasal spray Use in each nostril as directed   cetirizine  (ZYRTEC) 10 MG tablet Take 10 mg by mouth daily.   Dextromethorphan-buPROPion ER (AUVELITY) 45-105 MG TBCR 1 PO QAM   diclofenac (VOLTAREN) 50 MG EC tablet Take 50 mg by mouth 2 (two) times daily.   donepezil (ARICEPT) 5 MG tablet Take 1 tablet by mouth twice daily.   estradiol (ESTRACE) 0.5 MG tablet Take 0.5 mg by mouth daily. Taking half/day so taking 0.25mg    Evolocumab (REPATHA SURECLICK) 140 MG/ML SOAJ Inject 1ml under the skin every 14 days   fluticasone (FLONASE) 50 MCG/ACT nasal spray Place 1 spray into both nostrils daily.   levothyroxine (SYNTHROID) 88 MCG tablet 1 tablet orally daily in the morning on an empty stomach   Lidocaine, Anorectal, 5 % CREA Apply topically in the morning, at noon, in the evening, and at bedtime.   montelukast (SINGULAIR) 10 MG tablet Take 1 tablet (10 mg total) by mouth daily for allergies   polyethylene glycol (MIRALAX / GLYCOLAX) 17 g packet Take 17 g by mouth daily.   prazosin (MINIPRESS) 2 MG capsule 1 TO 3 PO QHS   sertraline (ZOLOFT) 100 MG tablet 2 tablet orally daily in the mornings   [DISCONTINUED] Alirocumab (PRALUENT) 150 MG/ML SOAJ Inject 150mg  under the skin every 2 weeks   [DISCONTINUED] Alirocumab (PRALUENT) 75 MG/ML SOAJ Inject 75 mg into the skin every 14 (fourteen) days.   [DISCONTINUED] Alirocumab (PRALUENT) 75 MG/ML SOAJ Inject 75 mg into the skin every 2 weeks.   [DISCONTINUED] cyclobenzaprine (FLEXERIL) 5 MG tablet Take 5 mg by mouth 2 (two) times daily. (Patient not taking: Reported on 03/17/2022)   [DISCONTINUED] diazepam (VALIUM) 5 MG tablet Take 1 tablet by mouth 90 minutes prior to procedure. Bring bottle with you to appt. *Must have a driver. (Patient not taking: Reported on 03/17/2022)   [DISCONTINUED] diclofenac sodium (VOLTAREN) 1 % GEL Apply 2 g topically 3 (three) times daily as needed.   [DISCONTINUED] donepezil (ARICEPT) 5 MG tablet Take 1 tablet (5 mg total)  by mouth 2 (two) times daily.   [DISCONTINUED] levothyroxine  (SYNTHROID) 75 MCG tablet Take 1 tablet (75 mcg total) by mouth daily before breakfast.   [DISCONTINUED] LORazepam (ATIVAN) 1 MG tablet Take 1 tablet by mouth 90 minutes prior to procedure. Bring bottle with you to appt. *Must have a driver (Patient not taking: Reported on 03/17/2022)   [DISCONTINUED] memantine (NAMENDA XR) 7 MG CP24 24 hr capsule Take 7 mg by mouth daily. (Patient not taking: Reported on 03/17/2022)   [DISCONTINUED] Multiple Vitamin (MULTIVITAMIN WITH MINERALS) TABS tablet Take 1 tablet by mouth daily.   [DISCONTINUED] neomycin-polymyxin b-dexamethasone (MAXITROL) 3.5-10000-0.1 OINT APPLY A SMALL AMOUNT ONTO SUTURES OR OPERATIVE SITE TWICE A DAY FOR THREE DAYS THEN STOP (Patient not taking: Reported on 03/17/2022)   [DISCONTINUED] Omega-3 Fatty Acids (FISH OIL PO) Take 1,400 mg by mouth daily.   [DISCONTINUED] Sertraline HCl 150 MG CAPS Take 1 capsule by mouth daily.   [DISCONTINUED] TGT PSYLLIUM FIBER PO Take 4 capsules by mouth daily. (Patient not taking: Reported on 03/17/2022)   [DISCONTINUED] valACYclovir (VALTREX) 500 MG tablet Take 250 mg by mouth 2 (two) times daily as needed (Out breaks Fever blisters).  (Patient not taking: Reported on 10/04/2021)   [DISCONTINUED] zaleplon (SONATA) 10 MG capsule Take 10 mg by mouth at bedtime as needed. (Patient not taking: Reported on 03/17/2022)   No facility-administered medications prior to visit.   Allergies  Allergen Reactions   Adhesive [Tape] Dermatitis   Atovaquone-Proguanil Hcl Other (See Comments)   Citrus Nausea And Vomiting   Escitalopram Hives   Escitalopram Oxalate Hives   Ezetimibe-Simvastatin Other (See Comments)   Niacin Other (See Comments)   Niacin And Related     Rash and breathing breathing problems   Rosuvastatin Other (See Comments)   Amoxicillin Rash    Unsure of reaction/ was instructed not to take drug. Unknown reaction    Hydrochlorothiazide Rash   Immunization History  Administered Date(s)  Administered   Hep A / Hep B 11/01/2018   Hepatitis A, Adult 11/01/2018, 05/06/2019   Influenza Split 10/05/2009, 10/20/2010, 10/12/2012, 10/07/2013, 10/17/2014, 09/30/2015, 08/06/2018, 09/26/2019, 08/25/2020   Influenza, High Dose Seasonal PF 08/15/2017   Influenza-Unspecified 08/20/2018   PFIZER(Purple Top)SARS-COV-2 Vaccination 01/21/2020, 02/11/2020, 03/30/2021   Pneumococcal Conjugate-13 02/03/2015   Pneumococcal Polysaccharide-23 01/08/2014   Tdap 05/26/2009   Typhoid Inactivated 11/01/2018   Zoster Recombinat (Shingrix) 08/10/2018, 06/24/2019   Zoster, Live 01/08/2014    Health Maintenance Due: Health Maintenance  Topic Date Due   Hepatitis C Screening  Never done   TETANUS/TDAP  05/27/2019   COVID-19 Vaccine (4 - Booster for Pfizer series) 05/25/2021   COLONOSCOPY (Pts 45-14yrs Insurance coverage will need to be confirmed)  02/26/2023   MAMMOGRAM  08/13/2023   Pneumonia Vaccine 27+ Years old  Completed   INFLUENZA VACCINE  Completed   DEXA SCAN  Completed   Zoster Vaccines- Shingrix  Completed   HPV VACCINES  Aged Out    Review of Systems All review of systems negative except what is listed in the HPI   Objective    BP 117/72   Pulse 72   Temp 98.4 F (36.9 C)   Ht 5\' 4"  (1.626 m)   Wt 144 lb 6.4 oz (65.5 kg)   SpO2 97%   BMI 24.79 kg/m  Physical Exam Vitals and nursing note reviewed.  Constitutional:      General: She is not in acute distress.    Appearance: Normal appearance.  Eyes:  Extraocular Movements: Extraocular movements intact.     Conjunctiva/sclera: Conjunctivae normal.     Pupils: Pupils are equal, round, and reactive to light.  Neck:     Thyroid: No thyromegaly or thyroid tenderness.     Vascular: No carotid bruit.  Cardiovascular:     Rate and Rhythm: Normal rate and regular rhythm.     Pulses: Normal pulses.     Heart sounds: Normal heart sounds. No murmur heard. Pulmonary:     Effort: Pulmonary effort is normal.     Breath  sounds: Normal breath sounds. No wheezing.  Musculoskeletal:        General: Normal range of motion.     Cervical back: Normal range of motion.     Right lower leg: No edema.     Left lower leg: No edema.  Lymphadenopathy:     Cervical: No cervical adenopathy.  Skin:    General: Skin is warm and dry.     Capillary Refill: Capillary refill takes less than 2 seconds.  Neurological:     General: No focal deficit present.     Mental Status: She is alert and oriented to person, place, and time.     Cranial Nerves: No cranial nerve deficit.     Sensory: No sensory deficit.     Motor: No weakness.     Coordination: Coordination normal.     Gait: Gait normal.     Deep Tendon Reflexes: Reflexes normal.  Psychiatric:        Mood and Affect: Mood normal.        Behavior: Behavior normal.        Thought Content: Thought content normal.        Judgment: Judgment normal.    Results for orders placed or performed in visit on 03/17/22  Comprehensive metabolic panel  Result Value Ref Range   Glucose 95 70 - 99 mg/dL   BUN 18 8 - 27 mg/dL   Creatinine, Ser 1.61 0.57 - 1.00 mg/dL   eGFR 92 >09 UE/AVW/0.98   BUN/Creatinine Ratio 27 12 - 28   Sodium 138 134 - 144 mmol/L   Potassium 4.2 3.5 - 5.2 mmol/L   Chloride 100 96 - 106 mmol/L   CO2 23 20 - 29 mmol/L   Calcium 9.6 8.7 - 10.3 mg/dL   Total Protein 7.0 6.0 - 8.5 g/dL   Albumin 4.6 3.7 - 4.7 g/dL   Globulin, Total 2.4 1.5 - 4.5 g/dL   Albumin/Globulin Ratio 1.9 1.2 - 2.2   Bilirubin Total 0.3 0.0 - 1.2 mg/dL   Alkaline Phosphatase 53 44 - 121 IU/L   AST 19 0 - 40 IU/L   ALT 21 0 - 32 IU/L  CBC with Differential/Platelet  Result Value Ref Range   WBC 5.9 3.4 - 10.8 x10E3/uL   RBC 4.55 3.77 - 5.28 x10E6/uL   Hemoglobin 13.5 11.1 - 15.9 g/dL   Hematocrit 11.9 14.7 - 46.6 %   MCV 90 79 - 97 fL   MCH 29.7 26.6 - 33.0 pg   MCHC 33.1 31.5 - 35.7 g/dL   RDW 82.9 56.2 - 13.0 %   Platelets 214 150 - 450 x10E3/uL   Neutrophils 45 Not  Estab. %   Lymphs 42 Not Estab. %   Monocytes 8 Not Estab. %   Eos 3 Not Estab. %   Basos 2 Not Estab. %   Neutrophils Absolute 2.6 1.4 - 7.0 x10E3/uL   Lymphocytes Absolute 2.5 0.7 - 3.1  x10E3/uL   Monocytes Absolute 0.5 0.1 - 0.9 x10E3/uL   EOS (ABSOLUTE) 0.2 0.0 - 0.4 x10E3/uL   Basophils Absolute 0.1 0.0 - 0.2 x10E3/uL   Immature Granulocytes 0 Not Estab. %   Immature Grans (Abs) 0.0 0.0 - 0.1 x10E3/uL  Lipid panel  Result Value Ref Range   Cholesterol, Total 286 (H) 100 - 199 mg/dL   Triglycerides 161 0 - 149 mg/dL   HDL 71 >09 mg/dL   VLDL Cholesterol Cal 21 5 - 40 mg/dL   LDL Chol Calc (NIH) 604 (H) 0 - 99 mg/dL   Lipid Comment: Comment    Chol/HDL Ratio 4.0 0.0 - 4.4 ratio  Hemoglobin A1c  Result Value Ref Range   Hgb A1c MFr Bld 5.7 (H) 4.8 - 5.6 %   Est. average glucose Bld gHb Est-mCnc 117 mg/dL  VITAMIN D 25 Hydroxy (Vit-D Deficiency, Fractures)  Result Value Ref Range   Vit D, 25-Hydroxy 49.9 30.0 - 100.0 ng/mL  TSH  Result Value Ref Range   TSH 7.780 (H) 0.450 - 4.500 uIU/mL  Iron, TIBC and Ferritin Panel  Result Value Ref Range   Total Iron Binding Capacity 309 250 - 450 ug/dL   UIBC 540 981 - 191 ug/dL   Iron 81 27 - 478 ug/dL   Iron Saturation 26 15 - 55 %   Ferritin 67 15 - 150 ng/mL  B12 and Folate Panel  Result Value Ref Range   Vitamin B-12 1,215 232 - 1,245 pg/mL   Folate 9.7 >3.0 ng/mL    Assessment & Plan      Problem List Items Addressed This Visit     Hypothyroidism - Primary    Chronic. Will obtain labs today for evaluation.  Patient reportedly feels better when TSH levels are very low- recent changes in her medication have caused her to experience more symptoms. I will review her labs and determine if medication changes are appropriate.       Relevant Orders   TSH (Completed)   Traumatic brain injury, closed    Fall with injury to head of unknown etiology. This has been a year ago with no additional falls, but is still experiencing  some memory concerns. Patient is also on aricept so unclear if memory issues are residuals of concussion or possibly due to memory changes of other etiology. She does have a hx of coronary artery atherosclerosis with high ASCVD risk score, therefore vascular dementia could be a culprit. Recommend close follow-up with neurology and will discuss cardiology evaluation with patient. Plan to review records further for complete evaluation once these have arrived.       Relevant Orders   Comprehensive metabolic panel (Completed)   CBC with Differential/Platelet (Completed)   VITAMIN D 25 Hydroxy (Vit-D Deficiency, Fractures) (Completed)   Iron, TIBC and Ferritin Panel (Completed)   B12 and Folate Panel (Completed)   Fibromyalgia    Chronic. No acute changes or concerns at this time.       Relevant Medications   diclofenac (VOLTAREN) 50 MG EC tablet   Hyperlipidemia    Chronic. The 10-year ASCVD risk score (Arnett DK, et al., 2019) is: 15.2%   Values used to calculate the score:     Age: 7 years     Sex: Female     Is Non-Hispanic African American: No     Diabetic: No     Tobacco smoker: No     Systolic Blood Pressure: 117 mmHg     Is  BP treated: Yes     HDL Cholesterol: 71 mg/dL     Total Cholesterol: 286 mg/dL Not currently on statin treatment at this time. Will obtain labs today for further evaluation.       Coronary artery arteriosclerosis    BP well controlled today. She is not currently on ASA or statin therapy.  The 10-year ASCVD risk score (Arnett DK, et al., 2019) is: 15.2% Recommend improved control to help reduce risks of heart attack and stroke. She may need evaluation with cardiology to help assess risks and medications.  With recent syncopal episode, it is not clear if this is related to dehydration, neurologic, or cardiovascular etiology. I do feel dehydration may be a component, but would like to rule out cardiac concerns. Will discuss with patient recommendation for  cardiology referral.       Relevant Orders   Comprehensive metabolic panel (Completed)   CBC with Differential/Platelet (Completed)   Lipid panel (Completed)   Essential hypertension    Well controlled today. No medications at this time.  Consider possible hypotension due to hypovolemia may be occurring resulting in dizziness and postural hypotension. Will discuss recommendation for cardiology evaluation given symptoms, age, and ascvd risks.       Weakness with dizziness   Relevant Orders   Comprehensive metabolic panel (Completed)   CBC with Differential/Platelet (Completed)   Lipid panel (Completed)   Hemoglobin A1c (Completed)   VITAMIN D 25 Hydroxy (Vit-D Deficiency, Fractures) (Completed)   TSH (Completed)   Iron, TIBC and Ferritin Panel (Completed)   B12 and Folate Panel (Completed)   Vitamin D deficiency   Relevant Orders   Comprehensive metabolic panel (Completed)   CBC with Differential/Platelet (Completed)   Lipid panel (Completed)   VITAMIN D 25 Hydroxy (Vit-D Deficiency, Fractures) (Completed)   Imbalance   Relevant Orders   Comprehensive metabolic panel (Completed)   CBC with Differential/Platelet (Completed)   Lipid panel (Completed)   Hemoglobin A1c (Completed)   VITAMIN D 25 Hydroxy (Vit-D Deficiency, Fractures) (Completed)   TSH (Completed)   Iron, TIBC and Ferritin Panel (Completed)   B12 and Folate Panel (Completed)   Other Visit Diagnoses     Encounter for medical examination to establish care            Return for labs tomorrow- follow-up based on lab results.      Kariya Lavergne, Sung Amabile, NP, DNP, AGNP-C [facility] at [facility]

## 2022-03-17 NOTE — Patient Instructions (Addendum)
Thank you for choosing [place] at [facility] for your Primary Care needs. I am excited for the opportunity to partner with you to meet your health care goals. It was a pleasure meeting you today! ? ?Recommendations from today's visit: ?I recommend working on your hydration status to see if this helps with the imbalance and unsteadiness ? ?Information on diet, exercise, and health maintenance recommendations are listed below. This is information to help you be sure you are on track for optimal health and monitoring.  ? ?Please look over this and let us know if you have any questions or if you have completed any of the health maintenance outside of [place] so that we can be sure your records are up to date.  ?___________________________________________________________ ?About Me: ?I am an Adult-Geriatric Nurse Practitioner with a background in caring for patients for more than 20 years with a strong intensive care background. I provide primary care and sports medicine services to patients age 52 and older within this office. My education had a strong focus on caring for the older adult population, which I am passionate about. I am also the director of the APP Fellowship with [facility].  ? ?My desire is to provide you with the best service through preventive medicine and supportive care. I consider you a part of the medical team and value your input. I work diligently to ensure that you are heard and your needs are met in a safe and effective manner. I want you to feel comfortable with me as your provider and want you to know that your health concerns are important to me. ? ?For your information, our office hours are: ?Monday, Tuesday, and Thursday 8:00 AM - 5:00 PM ?Wednesday and Friday 8:00 AM - 12:00 PM.  ? ?In my time away from the office I am teaching new APP's within the system and am unavailable, but my partner, Dr. Burnard Bunting is in the office for emergent needs.  ? ?If you have  questions or concerns, please call our office at [phone] or send [place] a MyChart message and we will respond as quickly as possible.  ?____________________________________________________________ ?MyChart:  ?For all urgent or time sensitive needs we ask that you please call the office to avoid delays. Our number is [phone]. ?MyChart is not constantly monitored and due to the large volume of messages a day, replies may take up to 72 business hours. ? ?MyChart Policy: ?MyChart allows for you to see your visit notes, after visit summary, provider recommendations, lab and tests results, make an appointment, request refills, and contact your provider or the office for non-urgent questions or concerns. Providers are seeing patients during normal business hours and do not have built in time to review MyChart messages.  ?We ask that you allow a minimum of 3 business days for responses to [employer]. For this reason, please do not send urgent requests through [place]. Please call the office at [phone]. ?New and ongoing conditions may require a visit. We have virtual and in person visit available for your convenience.  ?Complex MyChart concerns may require a visit. Your provider may request you schedule a virtual or in person visit to ensure we are providing the best care possible. ?MyChart messages sent after 11:00 AM on Friday will not be received by the provider until Monday morning.  ?  ?Lab and Test Results: ?You will receive your lab and test results on MyChart as soon as they are completed and results have been  sent by the lab or testing facility. Due to this service, you will receive your results BEFORE your provider.  ?I review lab and tests results each morning prior to seeing patients. Some results require collaboration with other providers to ensure you are receiving the most appropriate care. For this reason, we ask that you please allow a minimum of 3-5 business days from the time the ALL  results have been received for your provider to receive and review lab and test results and contact you about these.  ?Most lab and test result comments from the provider will be sent through [place]. Your provider may recommend changes to the plan of care, follow-up visits, repeat testing, ask questions, or request an office visit to discuss these results. You may reply directly to this message or call the office at [phone] to provide information for the provider or set up an appointment. ?In some instances, you will be called with test results and recommendations. Please let us know if this is preferred and we will make note of this in your chart to provide this for you.    ?If you have not heard a response to your lab or test results in 5 business days from all results returning to [place], please call the office to let us know. We ask that you please avoid calling prior to this time unless there is an emergent concern. Due to high call volumes, this can delay the resulting process. ? ?After Hours: ?For all non-emergency after hours needs, please call the office at [phone] and select the option to reach the on-call provider service. On-call services are shared between multiple [place] offices and therefore it will not be possible to speak directly with your provider. On-call providers may provide medical advice and recommendations, but are unable to provide refills for maintenance medications.  ?For all emergency or urgent medical needs after normal business hours, we recommend that you seek care at the closest Urgent Care or Emergency Department to ensure appropriate treatment in a timely manner.  ?[facility] [place] has a 24 hour emergency room located on the ground floor for your convenience.  ? ?Urgent Concerns During the Business Day ?Providers are seeing patients from 8AM to [place] with a busy schedule and are most often not able to respond to non-urgent calls until the end of the  day or the next business day. ?If you should have URGENT concerns during the day, please call and speak to the nurse or schedule a same day appointment so that we can address your concern without delay.  ? ?Thank you, again, for choosing me as your health care partner. I appreciate your trust and look forward to learning more about you.  ? ?Worthy Keeler, DNP, AGNP-c ?___________________________________________________________ ? ?Health Maintenance Recommendations ?Screening Testing ?Mammogram ?Every 1 -2 years based on history and risk factors ?Starting at age 83 ?Pap Smear ?Ages 21-39 every 3 years ?Ages 64-65 every 5 years with HPV testing ?More frequent testing may be required based on results and history ?Colon Cancer Screening ?Every 1-10 years based on test performed, risk factors, and history ?Starting at age 64 ?Bone Density Screening ?Every 2-10 years based on history ?Starting at age 48 for women ?Recommendations for men differ based on medication usage, history, and risk factors ?AAA Screening ?One time ultrasound ?Men 52-22 years old who have every smoked ?Lung Cancer Screening ?Low Dose Lung CT every 12 months ?Age 53-80 years with a 30 pack-year smoking history who still smoke  or who have quit within the last 15 years ? ?Screening Labs ?Routine  Labs: Complete Blood Count (CBC), Complete Metabolic Panel (CMP), Cholesterol (Lipid Panel) ?Every 6-12 months based on history and medications ?May be recommended more frequently based on current conditions or previous results ?Hemoglobin A1c Lab ?Every 3-12 months based on history and previous results ?Starting at age 26 or earlier with diagnosis of diabetes, high cholesterol, BMI >26, and/or risk factors ?Frequent monitoring for patients with diabetes to ensure blood sugar control ?Thyroid Panel (TSH w/ T3 & T4) ?Every 6 months based on history, symptoms, and risk factors ?May be repeated more often if on medication ?HIV ?One time testing for all patients  72 and older ?May be repeated more frequently for patients with increased risk factors or exposure ?Hepatitis C ?One time testing for all patients 44 and older ?May be repeated more frequently for patients wit

## 2022-03-18 ENCOUNTER — Ambulatory Visit (HOSPITAL_BASED_OUTPATIENT_CLINIC_OR_DEPARTMENT_OTHER): Payer: BC Managed Care – PPO

## 2022-03-18 DIAGNOSIS — E038 Other specified hypothyroidism: Secondary | ICD-10-CM | POA: Diagnosis not present

## 2022-03-18 DIAGNOSIS — E063 Autoimmune thyroiditis: Secondary | ICD-10-CM | POA: Diagnosis not present

## 2022-03-18 DIAGNOSIS — S069X0D Unspecified intracranial injury without loss of consciousness, subsequent encounter: Secondary | ICD-10-CM | POA: Diagnosis not present

## 2022-03-18 DIAGNOSIS — I2510 Atherosclerotic heart disease of native coronary artery without angina pectoris: Secondary | ICD-10-CM | POA: Diagnosis not present

## 2022-03-19 LAB — COMPREHENSIVE METABOLIC PANEL
ALT: 21 IU/L (ref 0–32)
AST: 19 IU/L (ref 0–40)
Albumin/Globulin Ratio: 1.9 (ref 1.2–2.2)
Albumin: 4.6 g/dL (ref 3.7–4.7)
Alkaline Phosphatase: 53 IU/L (ref 44–121)
BUN/Creatinine Ratio: 27 (ref 12–28)
BUN: 18 mg/dL (ref 8–27)
Bilirubin Total: 0.3 mg/dL (ref 0.0–1.2)
CO2: 23 mmol/L (ref 20–29)
Calcium: 9.6 mg/dL (ref 8.7–10.3)
Chloride: 100 mmol/L (ref 96–106)
Creatinine, Ser: 0.67 mg/dL (ref 0.57–1.00)
Globulin, Total: 2.4 g/dL (ref 1.5–4.5)
Glucose: 95 mg/dL (ref 70–99)
Potassium: 4.2 mmol/L (ref 3.5–5.2)
Sodium: 138 mmol/L (ref 134–144)
Total Protein: 7 g/dL (ref 6.0–8.5)
eGFR: 92 mL/min/{1.73_m2} (ref >59)

## 2022-03-19 LAB — CBC WITH DIFFERENTIAL/PLATELET
Basophils Absolute: 0.1 10*3/uL (ref 0.0–0.2)
Basos: 2 %
EOS (ABSOLUTE): 0.2 10*3/uL (ref 0.0–0.4)
Eos: 3 %
Hematocrit: 40.8 % (ref 34.0–46.6)
Hemoglobin: 13.5 g/dL (ref 11.1–15.9)
Immature Grans (Abs): 0 10*3/uL (ref 0.0–0.1)
Immature Granulocytes: 0 %
Lymphocytes Absolute: 2.5 10*3/uL (ref 0.7–3.1)
Lymphs: 42 %
MCH: 29.7 pg (ref 26.6–33.0)
MCHC: 33.1 g/dL (ref 31.5–35.7)
MCV: 90 fL (ref 79–97)
Monocytes Absolute: 0.5 10*3/uL (ref 0.1–0.9)
Monocytes: 8 %
Neutrophils Absolute: 2.6 10*3/uL (ref 1.4–7.0)
Neutrophils: 45 %
Platelets: 214 10*3/uL (ref 150–450)
RBC: 4.55 x10E6/uL (ref 3.77–5.28)
RDW: 13.1 % (ref 11.7–15.4)
WBC: 5.9 10*3/uL (ref 3.4–10.8)

## 2022-03-19 LAB — IRON,TIBC AND FERRITIN PANEL
Ferritin: 67 ng/mL (ref 15–150)
Iron Saturation: 26 % (ref 15–55)
Iron: 81 ug/dL (ref 27–139)
Total Iron Binding Capacity: 309 ug/dL (ref 250–450)
UIBC: 228 ug/dL (ref 118–369)

## 2022-03-19 LAB — HEMOGLOBIN A1C
Est. average glucose Bld gHb Est-mCnc: 117 mg/dL
Hgb A1c MFr Bld: 5.7 % — ABNORMAL HIGH (ref 4.8–5.6)

## 2022-03-19 LAB — LIPID PANEL
Chol/HDL Ratio: 4 ratio (ref 0.0–4.4)
Cholesterol, Total: 286 mg/dL — ABNORMAL HIGH (ref 100–199)
HDL: 71 mg/dL (ref >39)
LDL Chol Calc (NIH): 194 mg/dL — ABNORMAL HIGH (ref 0–99)
Triglycerides: 121 mg/dL (ref 0–149)
VLDL Cholesterol Cal: 21 mg/dL (ref 5–40)

## 2022-03-19 LAB — TSH: TSH: 7.78 u[IU]/mL — ABNORMAL HIGH (ref 0.450–4.500)

## 2022-03-19 LAB — B12 AND FOLATE PANEL
Folate: 9.7 ng/mL (ref >3.0)
Vitamin B-12: 1215 pg/mL (ref 232–1245)

## 2022-03-19 LAB — VITAMIN D 25 HYDROXY (VIT D DEFICIENCY, FRACTURES): Vit D, 25-Hydroxy: 49.9 ng/mL (ref 30.0–100.0)

## 2022-03-22 DIAGNOSIS — F330 Major depressive disorder, recurrent, mild: Secondary | ICD-10-CM | POA: Diagnosis not present

## 2022-03-22 DIAGNOSIS — F4312 Post-traumatic stress disorder, chronic: Secondary | ICD-10-CM | POA: Diagnosis not present

## 2022-03-22 NOTE — Assessment & Plan Note (Signed)
Fall with injury to head of unknown etiology. This has been a year ago with no additional falls, but is still experiencing some memory concerns. Patient is also on aricept so unclear if memory issues are residuals of concussion or possibly due to memory changes of other etiology. She does have a hx of coronary artery atherosclerosis with high ASCVD risk score, therefore vascular dementia could be a culprit. Recommend close follow-up with neurology and will discuss cardiology evaluation with patient. Plan to review records further for complete evaluation once these have arrived.  ?

## 2022-03-22 NOTE — Assessment & Plan Note (Signed)
Chronic. No acute changes or concerns at this time.  ?

## 2022-03-22 NOTE — Assessment & Plan Note (Signed)
Chronic. Will obtain labs today for evaluation.  ?Patient reportedly feels better when TSH levels are very low- recent changes in her medication have caused her to experience more symptoms. I will review her labs and determine if medication changes are appropriate.  ?

## 2022-03-22 NOTE — Assessment & Plan Note (Signed)
Well controlled today. No medications at this time.  ?Consider possible hypotension due to hypovolemia may be occurring resulting in dizziness and postural hypotension. Will discuss recommendation for cardiology evaluation given symptoms, age, and ascvd risks.  ?

## 2022-03-22 NOTE — Assessment & Plan Note (Signed)
Chronic. The 10-year ASCVD risk score (Arnett DK, et al., 2019) is: 15.2% ?  Values used to calculate the score: ?    Age: 76 years ?    Sex: Female ?    Is Non-Hispanic African American: No ?    Diabetic: No ?    Tobacco smoker: No ?    Systolic Blood Pressure: 676 mmHg ?    Is BP treated: Yes ?    HDL Cholesterol: 71 mg/dL ?    Total Cholesterol: 286 mg/dL ?Not currently on statin treatment at this time. Will obtain labs today for further evaluation.  ?

## 2022-03-22 NOTE — Assessment & Plan Note (Signed)
BP well controlled today. She is not currently on ASA or statin therapy.  ?The 10-year ASCVD risk score (Arnett DK, et al., 2019) is: 15.2% ?Recommend improved control to help reduce risks of heart attack and stroke. She may need evaluation with cardiology to help assess risks and medications.  ?With recent syncopal episode, it is not clear if this is related to dehydration, neurologic, or cardiovascular etiology. I do feel dehydration may be a component, but would like to rule out cardiac concerns. Will discuss with patient recommendation for cardiology referral.  ?

## 2022-03-23 ENCOUNTER — Telehealth (HOSPITAL_BASED_OUTPATIENT_CLINIC_OR_DEPARTMENT_OTHER): Payer: Self-pay

## 2022-03-24 ENCOUNTER — Encounter (HOSPITAL_BASED_OUTPATIENT_CLINIC_OR_DEPARTMENT_OTHER): Payer: Self-pay | Admitting: Nurse Practitioner

## 2022-03-25 ENCOUNTER — Ambulatory Visit (INDEPENDENT_AMBULATORY_CARE_PROVIDER_SITE_OTHER): Payer: Medicare Other | Admitting: Nurse Practitioner

## 2022-03-25 ENCOUNTER — Encounter (HOSPITAL_BASED_OUTPATIENT_CLINIC_OR_DEPARTMENT_OTHER): Payer: Self-pay | Admitting: Nurse Practitioner

## 2022-03-25 ENCOUNTER — Other Ambulatory Visit (HOSPITAL_BASED_OUTPATIENT_CLINIC_OR_DEPARTMENT_OTHER): Payer: Self-pay

## 2022-03-25 VITALS — BP 128/88 | HR 68 | Temp 98.7°F | Ht 64.0 in | Wt 143.1 lb

## 2022-03-25 DIAGNOSIS — I10 Essential (primary) hypertension: Secondary | ICD-10-CM

## 2022-03-25 DIAGNOSIS — I2510 Atherosclerotic heart disease of native coronary artery without angina pectoris: Secondary | ICD-10-CM

## 2022-03-25 DIAGNOSIS — E063 Autoimmune thyroiditis: Secondary | ICD-10-CM

## 2022-03-25 DIAGNOSIS — E038 Other specified hypothyroidism: Secondary | ICD-10-CM

## 2022-03-25 DIAGNOSIS — Z789 Other specified health status: Secondary | ICD-10-CM

## 2022-03-25 DIAGNOSIS — E7801 Familial hypercholesterolemia: Secondary | ICD-10-CM

## 2022-03-25 MED ORDER — LEVOTHYROXINE SODIUM 112 MCG PO TABS
112.0000 ug | ORAL_TABLET | Freq: Every day | ORAL | 3 refills | Status: DC
  Filled 2022-03-25: qty 90, 90d supply, fill #0
  Filled 2022-06-20: qty 90, 90d supply, fill #1
  Filled 2022-09-19: qty 90, 90d supply, fill #2
  Filled 2022-12-19: qty 90, 90d supply, fill #3

## 2022-03-25 MED ORDER — REPATHA PUSHTRONEX SYSTEM 420 MG/3.5ML ~~LOC~~ SOCT
420.0000 mg | SUBCUTANEOUS | 11 refills | Status: DC
  Filled 2022-03-25: qty 3.5, 30d supply, fill #0
  Filled 2022-03-28 – 2022-04-01 (×3): qty 3.5, 30d supply, fill #1
  Filled 2022-06-09: qty 3.5, 30d supply, fill #2
  Filled 2022-07-01 – 2022-07-04 (×2): qty 3.5, 30d supply, fill #3
  Filled 2022-07-25 – 2022-08-09 (×3): qty 3.5, 30d supply, fill #4
  Filled 2022-09-01: qty 3.5, 30d supply, fill #5
  Filled 2022-10-03: qty 3.5, 30d supply, fill #6
  Filled 2022-10-31: qty 3.5, 30d supply, fill #7
  Filled 2022-11-28 – 2022-12-09 (×2): qty 3.5, 30d supply, fill #8

## 2022-03-25 NOTE — Progress Notes (Signed)
? ?Established Patient Office Visit ? ?Subjective:  ?Patient ID: Wendy Joyce, female    DOB: 05-02-1946  Age: 76 y.o. MRN: [id number] ? ?CC:  ?Chief Complaint  ?Patient presents with  ? Follow-up  ? ? ?HPI ?Wendy Joyce presents for evaluation for f/u of hashimotos hypothyroidism and HLD.  ? ?Hashimoto's ?Long term history of hypothyroidism ?Last PCP lowered levothyroxine ?Since that time she has been experiencing severely low energy levels, to the point she feels she "can hardly move" ?She has also been experiencing a "prickly heat sensation", bloating/swelling ?Her recent labs showed elevated TSH levels which confirm her thyroid is not well controlled ?She is concerned today that the dose will again be lowered  ? 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 ?She has a family history of stroke and CVD in her father ?She denies CP, ShOB, HTN, HA ? ? ?Outpatient Medications Prior to Visit  ?Medication Sig Dispense Refill  ? acyclovir (ZOVIRAX) 200 MG capsule Take 1 capsule (200 mg total) by mouth 2 (two) times daily. Take as needed for symptoms. 60 capsule 5  ? acyclovir ointment (ZOVIRAX) 5 % Apply 1 application topically daily as needed (for fever blister).    ? albuterol (VENTOLIN HFA) 108 (90 Base) MCG/ACT inhaler Inhale 2 puffs into the lungs every 6 (six) hours as needed for wheezing or shortness of breath. 18 g 5  ? ALPRAZolam (XANAX) 0.25 MG tablet Take 1-2  tablets at bedtime and up to 3 times a day when needed for panic attacks 45 tablet 5  ? cetirizine (ZYRTEC) 10 MG tablet Take 10 mg by mouth daily.    ? Dextromethorphan-buPROPion ER (AUVELITY) 45-105 MG TBCR 1 PO QAM 30 tablet 1  ? diclofenac (VOLTAREN) 50 MG EC tablet Take 50 mg by mouth 2 (two) times daily.    ? donepezil (ARICEPT) 5 MG  tablet Take 1 tablet by mouth twice daily. 180 tablet 1  ? fluticasone (FLONASE) 50 MCG/ACT nasal spray Place 1 spray into both nostrils daily.    ? Lidocaine, Anorectal, 5 % CREA Apply topically in the morning, at noon, in the evening, and at bedtime.    ? montelukast (SINGULAIR) 10 MG tablet Take 1 tablet (10 mg total) by mouth daily for allergies 90 tablet 0  ? polyethylene glycol (MIRALAX / GLYCOLAX) 17 g packet Take 17 g by mouth daily.    ? prazosin (MINIPRESS) 2 MG capsule 1 TO 3 PO QHS 90 capsule 5  ? estradiol (ESTRACE) 0.5 MG tablet Take 0.5 mg by mouth daily. Taking half/day so taking 0.'25mg'$   3  ? Evolocumab (REPATHA SURECLICK) 382 MG/ML SOAJ Inject 27m under the skin every 14 days 2 mL 11  ? levothyroxine (SYNTHROID) 88 MCG tablet 1 tablet orally daily in the morning on an empty stomach 90 tablet 3  ? sertraline (ZOLOFT) 100 MG tablet 2 tablet orally daily in the mornings 180 tablet 1  ? azelastine (ASTELIN) 0.1 % nasal spray Use in each nostril as directed    ? ?No facility-administered medications prior to visit.  ? ? ?Allergies  ?Allergen Reactions  ? Adhesive [Tape] Dermatitis  ? Atovaquone-Proguanil Hcl Other (See Comments)  ? Citrus Nausea And Vomiting  ? Escitalopram Hives  ? Escitalopram Oxalate Hives  ? Ezetimibe-Simvastatin Other (See Comments)  ?  Niacin Other (See Comments)  ? Niacin And Related   ?  Rash and breathing breathing problems  ? Rosuvastatin Other (See Comments)  ? Amoxicillin Rash  ?  Unsure of reaction/ was instructed not to take drug. ?Unknown reaction ?  ? Hydrochlorothiazide Rash  ? ? ?ROS ?Review of Systems ?All review of systems negative except what is listed in the HPI ? ?  ?Objective:  ?  ?Physical Exam ?Vitals and nursing note reviewed.  ?Constitutional:   ?   General: She is not in acute distress. ?   Appearance: Normal appearance.  ?HENT:  ?   Head: Normocephalic.  ?Eyes:  ?   Extraocular Movements: Extraocular movements intact.  ?   Conjunctiva/sclera: Conjunctivae  normal.  ?   Pupils: Pupils are equal, round, and reactive to light.  ?Neck:  ?   Vascular: No carotid bruit.  ?Cardiovascular:  ?   Rate and Rhythm: Normal rate and regular rhythm.  ?   Pulses: Normal pulses.  ?   Heart sounds: Normal heart sounds. No murmur heard. ?Pulmonary:  ?   Effort: Pulmonary effort is normal. No respiratory distress.  ?   Breath sounds: Normal breath sounds.  ?Abdominal:  ?   General: Bowel sounds are normal.  ?   Palpations: Abdomen is soft.  ?Musculoskeletal:     ?   General: Normal range of motion.  ?   Cervical back: Normal range of motion. No tenderness.  ?   Right lower leg: No edema.  ?   Left lower leg: No edema.  ?Lymphadenopathy:  ?   Cervical: No cervical adenopathy.  ?Skin: ?   General: Skin is warm and dry.  ?   Capillary Refill: Capillary refill takes less than 2 seconds.  ?Neurological:  ?   General: No focal deficit present.  ?   Mental Status: She is alert and oriented to person, place, and time.  ?Psychiatric:     ?   Mood and Affect: Mood normal.     ?   Behavior: Behavior normal.     ?   Thought Content: Thought content normal.     ?   Judgment: Judgment normal.  ? ? ?BP 128/88   Pulse 68   Temp 98.7 ?F (37.1 ?C)   Ht '5\' 4"'$  (1.626 m)   Wt 143 lb 1.6 oz (64.9 kg)   SpO2 98%   BMI 24.56 kg/m?  ?Wt Readings from Last 3 Encounters:  ?03/25/22 143 lb 1.6 oz (64.9 kg)  ?03/17/22 144 lb 6.4 oz (65.5 kg)  ?10/04/21 140 lb 12.8 oz (63.9 kg)  ? ? ?  ?Assessment & Plan:  ? ?Problem List Items Addressed This Visit   ? ? Hypothyroidism  ?  Chronic. Not well controlled at this time. Reviewed recent labs with patient in office today.  ?Education provided on lab results and need for increased dosing based on high TSH levels- patient was relieved at this finding. She is having substantial symptoms with her current levels.  ?She does better with the same dose of medication daily, therefore, will increase daily dosing and send new script for levothyroxine ?Will plan to recheck labs  in 6-8 weeks and adjust accordingly.  ?  ?  ? Relevant Medications  ? levothyroxine (SYNTHROID) 112 MCG tablet  ? Hashimoto's thyroiditis  ?  See hypothyroidism from same day encounter ?  ?  ? Relevant Medications  ? levothyroxine (SYNTHROID) 112 MCG tablet  ? Hyperlipidemia - Primary  ?  Chronic. Labs reviewed with patient today. Results consistent with familial hyperlipidemia.  ?Current repatha dosing is not controlling levels adequately. Patient is high risk for CVD given history and current labs.  ?Recommend increase repatha dosing and continue to work on diet and exercise.  ?Recommend less than 13g of saturated fat daily and increase fiber intake to 10-30 grams a day for best control.  ?Will recheck labs in 12 weeks for evaluation of treatment.  ?  ?  ? Relevant Medications  ? Evolocumab with Infusor ([place]) 420 MG/3.5ML SOCT  ? Coronary artery arteriosclerosis  ?  Chronic. Recent labs reviewed with patient today.  ?Lipids not well controlled on current dose of repatha.  ?Patient statin intolerant.  ?Recommend increase repatha dosing and monitor for improvement  ?Continue with diet and exercise modifications.  ?No alarm sx present today. BP well controlled.  ?  ?  ? Relevant Medications  ? Evolocumab with Infusor ([place]) 420 MG/3.5ML SOCT  ? Essential hypertension  ?  Chronic. Well controlled on current regimen. Recent labs reviewed with patient today.  ?Recommend improved lipid control for increased risk reduction.  ?Will increase Repatha dosing to higher dose and monitor.  ?  ?  ? Relevant Medications  ? Evolocumab with Infusor ([place]) 420 MG/3.5ML SOCT  ? Statin not tolerated  ?  Familial HLD with statin intolerance. Currently on repatha with suboptimal control. Recommend increase dose of repatha and monitor in 12 weeks.  ?Education provided today.  ?  ?  ? Relevant Medications  ? Evolocumab with Infusor ([place]) 420 MG/3.5ML SOCT   ? ? ?Meds ordered this encounter  ?Medications  ? Evolocumab with Infusor ([place]) 420 MG/3.5ML SOCT  ?  Sig: Inject 420 mg into the skin every 30 (thirty) days.  ?  Dispense:  3.5 mL  ?  Re

## 2022-03-25 NOTE — Patient Instructions (Signed)
I have sent in levothyroxine 123mg daily for you to take once a day. I want to recheck these labs in 8 weeks to make sure the levels are stable. If you see Dr. SKelton Pillarbefore then she can discuss options with you.  ? ?I have sent in the repatha, we will do '420mg'$  once a month and see where your cholesterol levels are in 3 months on this to make sure this is the right dose. We may need to increase to every 2 weeks if we don't have good control.  ? ?Pre-Diabetes Patient Education: ? ?Hemoglobin A1c is a test to monitor your average blood sugar levels over the past 3 months. This test allows [place] monitor your risk of developing diabetes and to work on medication, diet, and exercise Marysa Wessner on to help prevent this and the complications that come along with it.  ? ?For patients with a Hemoglobin A1c in the low pre-diabetic range (5.6% - 5.9%) we test once a year.  ?For patients with a Hemoglobin A1c in the high pre-diabetic range (6.0% - 6.4%) we monitor more closely by testing every 6 months to help catch increasing levels sooner and work to prevent development of Diabetes.  ? ?Recommendations for lowering your blood sugar include: ? ?Goal Carbohydrate intake: Less than 150 grams a day ?Goal saturated fat intake: less than 13 grams a day ?Goal fiber intake: 30 grams a day ?Walking/Exercise: 30 minutes a day ? ?Regular physical activity ?Walking just 30 minutes every day can help with lowering your blood sugar levels by helping you lose weight and expend energy, both of which will help lower your blood sugars.  ? ?Diet Management ?Monitor your diet and limit or avoid foods that are high in carbohydrates, sugar, and fat.  ?Some examples of foods to limit include: ?Candy  ?Sugary breakfast cereals  ?White pasta  ?White bread  ?White rice  ?Potatoes  ?Cookies, muffins, and other baked products flavored and sweetened  ?Yogurt  ?Potato chips  ?Fruit juices  ?Sodas  ?Sports Drinks ?Alcoholic Beverages ?Other foods and drinks  with a high fructose corn syrup content  ?Other foods and drinks high in refined sugar  ?Processed foods ? ?Read the label on packaging and look for "sugar" and "high fructose corn syrup"- these foods should be avoided. ?A good rule of thumb is select whole foods and grains for the most nutritional value when carbohydrates are eaten.  ? ?Portion Control ?In addition to watching the foods you eat, it is also important to monitor the portion size of the foods. When selecting meals be sure to follow these common tips to avoid over eating: ?Vegetables -- about 2 to 3.5 cups per day ?Fruits -- about 2 cups per day  ?Grains -- about 5 to 8 ounces (whole grains 3 to 5 ounces) per day ?Dairy -- about 3 cups (fat-free or low-fat) per day ?Protein foods -- about 5 to 6? ounces per day ?Oils -- no more than 5 to 7 teaspoons per day ? ?Carbohydrates about 30-45 grams per day ? ?Read the labels on packaging to find out the serving size of that particular food and measure your portions.  ?Keep track of your snacking and avoid unhealthy snacks in between meals. ? ?Include protein with meals ?Ex: add nuts to oatmeal ? ?Aim for 1/2 plate of non starchy vegetables at lunch and dinner ( 1cup +) ? ?Have a protein rich snack at night if hungry ?Ex: 2-4 crackers with peanut butter or  cheese ?1/2 apple with peanut butter ?Celery stick with pimento cheese ( 2 Tbs) ?Greek yogurt with fruit ?3 cups popcorn, cheese stick ? ?Increase high fiber foods ?1/2 plate non starchy vegetables. Aim to have with lunch and dinner ?Include 1-2 cups/ pieces fruit/ day; snack, with meals etc ?Choose high fiber starches. Start with one to two servings (1/4 plate) ? ?Choose lean proteins/ meats ?Include some protein with all meals and most snacks ( depending on how hungry you are) ?7-8 oz/ day ( 50-60 grams) 1/4 plate portion = 3-4 oz  ? ?Snack on whole foods: fruit, veggies, cheese, nuts, nut butter high fiber crackers. ? ?The website: [employer]  Can  help you with food selection options that are healthy choices.  ? ?Water ?Be sure you are drinking enough water every day to help your body work its best.  ?The average adult should be drinking at least eight 8 ounce cups of water per day (total of 64 ounces of water). ?If you are in the heat, sweating, having diarrhea or vomiting, or running a fever you will need more water.  ? ?

## 2022-03-28 ENCOUNTER — Other Ambulatory Visit (HOSPITAL_BASED_OUTPATIENT_CLINIC_OR_DEPARTMENT_OTHER): Payer: Self-pay

## 2022-03-29 ENCOUNTER — Other Ambulatory Visit (HOSPITAL_BASED_OUTPATIENT_CLINIC_OR_DEPARTMENT_OTHER): Payer: Self-pay

## 2022-03-31 ENCOUNTER — Other Ambulatory Visit (HOSPITAL_BASED_OUTPATIENT_CLINIC_OR_DEPARTMENT_OTHER): Payer: Self-pay

## 2022-04-01 ENCOUNTER — Other Ambulatory Visit (HOSPITAL_BASED_OUTPATIENT_CLINIC_OR_DEPARTMENT_OTHER): Payer: Self-pay

## 2022-04-01 NOTE — Assessment & Plan Note (Signed)
Chronic. Well controlled on current regimen. Recent labs reviewed with patient today.  ?Recommend improved lipid control for increased risk reduction.  ?Will increase Repatha dosing to higher dose and monitor.  ?

## 2022-04-01 NOTE — Assessment & Plan Note (Signed)
See hypothyroidism from same day encounter ?

## 2022-04-01 NOTE — Assessment & Plan Note (Signed)
Chronic. Recent labs reviewed with patient today.  ?Lipids not well controlled on current dose of repatha.  ?Patient statin intolerant.  ?Recommend increase repatha dosing and monitor for improvement  ?Continue with diet and exercise modifications.  ?No alarm sx present today. BP well controlled.  ?

## 2022-04-01 NOTE — Assessment & Plan Note (Signed)
Chronic. Not well controlled at this time. Reviewed recent labs with patient in office today.  ?Education provided on lab results and need for increased dosing based on high TSH levels- patient was relieved at this finding. She is having substantial symptoms with her current levels.  ?She does better with the same dose of medication daily, therefore, will increase daily dosing and send new script for levothyroxine ?Will plan to recheck labs in 6-8 weeks and adjust accordingly.  ?

## 2022-04-01 NOTE — Assessment & Plan Note (Signed)
Chronic. Labs reviewed with patient today. Results consistent with familial hyperlipidemia.  ?Current repatha dosing is not controlling levels adequately. Patient is high risk for CVD given history and current labs.  ?Recommend increase repatha dosing and continue to work on diet and exercise.  ?Recommend less than 13g of saturated fat daily and increase fiber intake to 10-30 grams a day for best control.  ?Will recheck labs in 12 weeks for evaluation of treatment.  ?

## 2022-04-01 NOTE — Assessment & Plan Note (Signed)
Familial HLD with statin intolerance. Currently on repatha with suboptimal control. Recommend increase dose of repatha and monitor in 12 weeks.  ?Education provided today.  ?

## 2022-04-04 ENCOUNTER — Other Ambulatory Visit (HOSPITAL_BASED_OUTPATIENT_CLINIC_OR_DEPARTMENT_OTHER): Payer: Self-pay

## 2022-04-04 ENCOUNTER — Ambulatory Visit: Payer: PPO | Admitting: Internal Medicine

## 2022-04-04 MED ORDER — MONTELUKAST SODIUM 10 MG PO TABS
10.0000 mg | ORAL_TABLET | Freq: Every day | ORAL | 0 refills | Status: DC
  Filled 2022-04-04: qty 90, 90d supply, fill #0

## 2022-04-06 ENCOUNTER — Other Ambulatory Visit (HOSPITAL_BASED_OUTPATIENT_CLINIC_OR_DEPARTMENT_OTHER): Payer: Self-pay

## 2022-04-06 ENCOUNTER — Encounter (HOSPITAL_BASED_OUTPATIENT_CLINIC_OR_DEPARTMENT_OTHER): Payer: Self-pay | Admitting: Pharmacist

## 2022-04-06 MED ORDER — ALPRAZOLAM 0.25 MG PO TABS
ORAL_TABLET | ORAL | 2 refills | Status: DC
  Filled 2022-04-06 – 2022-07-01 (×2): qty 30, 30d supply, fill #0
  Filled 2022-08-16: qty 30, 30d supply, fill #1

## 2022-04-06 MED ORDER — VILAZODONE HCL 10 MG PO TABS
ORAL_TABLET | ORAL | 1 refills | Status: DC
  Filled 2022-04-06: qty 30, 30d supply, fill #0
  Filled 2022-06-14: qty 30, 30d supply, fill #1

## 2022-04-07 ENCOUNTER — Other Ambulatory Visit (HOSPITAL_BASED_OUTPATIENT_CLINIC_OR_DEPARTMENT_OTHER): Payer: Self-pay

## 2022-05-03 DIAGNOSIS — F4312 Post-traumatic stress disorder, chronic: Secondary | ICD-10-CM | POA: Diagnosis not present

## 2022-05-03 DIAGNOSIS — F330 Major depressive disorder, recurrent, mild: Secondary | ICD-10-CM | POA: Diagnosis not present

## 2022-05-24 ENCOUNTER — Ambulatory Visit (HOSPITAL_BASED_OUTPATIENT_CLINIC_OR_DEPARTMENT_OTHER): Payer: BC Managed Care – PPO

## 2022-05-24 ENCOUNTER — Other Ambulatory Visit (HOSPITAL_BASED_OUTPATIENT_CLINIC_OR_DEPARTMENT_OTHER): Payer: Self-pay | Admitting: Nurse Practitioner

## 2022-05-24 ENCOUNTER — Other Ambulatory Visit (HOSPITAL_BASED_OUTPATIENT_CLINIC_OR_DEPARTMENT_OTHER): Payer: Self-pay

## 2022-05-24 DIAGNOSIS — E038 Other specified hypothyroidism: Secondary | ICD-10-CM | POA: Diagnosis not present

## 2022-05-24 DIAGNOSIS — E063 Autoimmune thyroiditis: Secondary | ICD-10-CM | POA: Diagnosis not present

## 2022-05-25 DIAGNOSIS — F4312 Post-traumatic stress disorder, chronic: Secondary | ICD-10-CM | POA: Diagnosis not present

## 2022-05-25 DIAGNOSIS — F330 Major depressive disorder, recurrent, mild: Secondary | ICD-10-CM | POA: Diagnosis not present

## 2022-05-25 LAB — LIPID PANEL
Chol/HDL Ratio: 2.4 ratio (ref 0.0–4.4)
Cholesterol, Total: 184 mg/dL (ref 100–199)
HDL: 76 mg/dL (ref >39)
LDL Chol Calc (NIH): 94 mg/dL (ref 0–99)
Triglycerides: 79 mg/dL (ref 0–149)
VLDL Cholesterol Cal: 14 mg/dL (ref 5–40)

## 2022-05-25 LAB — THYROID PANEL WITH TSH
Free Thyroxine Index: 3.6 (ref 1.2–4.9)
T3 Uptake Ratio: 34 % (ref 24–39)
T4, Total: 10.5 ug/dL (ref 4.5–12.0)
TSH: 0.046 u[IU]/mL — ABNORMAL LOW (ref 0.450–4.500)

## 2022-05-26 ENCOUNTER — Telehealth (HOSPITAL_BASED_OUTPATIENT_CLINIC_OR_DEPARTMENT_OTHER): Payer: Self-pay | Admitting: Nurse Practitioner

## 2022-05-26 NOTE — Telephone Encounter (Signed)
Called pt on 6/1 2 3:09 pm to schedule repeat labs in 6-8 weeks. Pt states that she will schedule when she comes in on Monday for her office visit.

## 2022-05-27 NOTE — Progress Notes (Signed)
LVM for pt to cb to sch lab redraw.

## 2022-05-30 ENCOUNTER — Other Ambulatory Visit (HOSPITAL_BASED_OUTPATIENT_CLINIC_OR_DEPARTMENT_OTHER): Payer: Self-pay

## 2022-05-30 ENCOUNTER — Ambulatory Visit (INDEPENDENT_AMBULATORY_CARE_PROVIDER_SITE_OTHER): Payer: Medicare Other | Admitting: Nurse Practitioner

## 2022-05-30 ENCOUNTER — Encounter (HOSPITAL_BASED_OUTPATIENT_CLINIC_OR_DEPARTMENT_OTHER): Payer: Self-pay | Admitting: Nurse Practitioner

## 2022-05-30 VITALS — BP 126/50 | HR 56 | Ht 63.0 in | Wt 146.1 lb

## 2022-05-30 DIAGNOSIS — F331 Major depressive disorder, recurrent, moderate: Secondary | ICD-10-CM

## 2022-05-30 DIAGNOSIS — I2510 Atherosclerotic heart disease of native coronary artery without angina pectoris: Secondary | ICD-10-CM

## 2022-05-30 DIAGNOSIS — M25532 Pain in left wrist: Secondary | ICD-10-CM | POA: Diagnosis not present

## 2022-05-30 DIAGNOSIS — I10 Essential (primary) hypertension: Secondary | ICD-10-CM | POA: Diagnosis not present

## 2022-05-30 DIAGNOSIS — E782 Mixed hyperlipidemia: Secondary | ICD-10-CM | POA: Diagnosis not present

## 2022-05-30 DIAGNOSIS — M778 Other enthesopathies, not elsewhere classified: Secondary | ICD-10-CM

## 2022-05-30 DIAGNOSIS — N951 Menopausal and female climacteric states: Secondary | ICD-10-CM

## 2022-05-30 DIAGNOSIS — R7303 Prediabetes: Secondary | ICD-10-CM

## 2022-05-30 MED ORDER — CLONIDINE HCL 0.1 MG PO TABS
ORAL_TABLET | ORAL | 11 refills | Status: DC
  Filled 2022-05-30: qty 30, 30d supply, fill #0
  Filled 2022-06-14 – 2022-06-23 (×2): qty 30, 30d supply, fill #1
  Filled 2022-07-25: qty 30, 30d supply, fill #2
  Filled 2022-08-22: qty 30, 30d supply, fill #3

## 2022-05-30 MED ORDER — OZEMPIC (0.25 OR 0.5 MG/DOSE) 2 MG/3ML ~~LOC~~ SOPN
PEN_INJECTOR | SUBCUTANEOUS | 0 refills | Status: DC
  Filled 2022-05-30: qty 3, 42d supply, fill #0

## 2022-05-30 NOTE — Progress Notes (Unsigned)
Orma Render, DNP, AGNP-c [facility] Medicine [address]  [place], [place] [address] [phone]  Subjective:   Wendy Joyce is a 76 y.o. female presents to day for medication concerns and L wrist pain.  Medication  L Wrist pain Started over a year ago Daily pain in the left wrist and into the palmar surface of the hand She does work on the computer and denies and repetitive activities with the left hand She tells me that her fit bit caused pain in the area however this has not improved by removing the fitbit She does not feel she does repetitive work with that hand She tells me it will "flair" frequently causing pain and weakness  Started on Trintellix on May 19 by Dr. Toy Care Causing loss of balance and loud ringing ears, especially the left This has not changed at all since starting the medication She has had an increase in her IBS symptoms when she started The diarrhea is a little better, but  still not good She tells me she is having "intense, negative thoughts" and she is not sleeping She tapered off of the sertraline and then started valizodone for 10 days then she started the trintellix She reports while on the valizodone she felt very good emotionally   Ozempic She saw on the news that Ozempic is being studied for OCD treatment She tells me that she is gaining a lot of weight recently despite strict diet and exercise She tells me her weight gain is causing worsening anxiety symptoms She feels that her medication may be contributing to her weight and difficulty losing.  She is interested in starting Ozempic to see if this is helpful for her mood and for her weight, she is aware that this will likely not be covered by insurance, but is willing to pay out of pocket if needed.   PMH, Medications, and Allergies reviewed and updated in chart.   ROS negative except for what is listed in HPI. Objective:  BP (!) 126/50    Pulse (!) 56   Ht '5\' 3"'$  (1.6 m)   Wt 146 lb 1.6 oz (66.3 kg)   SpO2 100%   BMI 25.88 kg/m  Physical Exam Vitals and nursing note reviewed.  Constitutional:      General: She is not in acute distress.    Appearance: Normal appearance.  HENT:     Head: Normocephalic.  Eyes:     Extraocular Movements: Extraocular movements intact.     Conjunctiva/sclera: Conjunctivae normal.     Pupils: Pupils are equal, round, and reactive to light.  Neck:     Vascular: No carotid bruit.  Cardiovascular:     Rate and Rhythm: Normal rate and regular rhythm.     Pulses: Normal pulses.     Heart sounds: Normal heart sounds. No murmur heard. Pulmonary:     Effort: Pulmonary effort is normal.     Breath sounds: Normal breath sounds. No wheezing.  Abdominal:     General: Bowel sounds are normal. There is no distension.     Palpations: Abdomen is soft.     Tenderness: There is no abdominal tenderness. There is no guarding.  Musculoskeletal:        General: Swelling and tenderness present.     Right wrist: Normal.     Left wrist: Swelling and tenderness present. No deformity, bony tenderness, snuff box tenderness or crepitus. Decreased range of motion. Normal pulse.  Cervical back: Normal range of motion and neck supple.     Right lower leg: No edema.     Left lower leg: No edema.  Lymphadenopathy:     Cervical: No cervical adenopathy.  Skin:    General: Skin is warm and dry.     Capillary Refill: Capillary refill takes less than 2 seconds.  Neurological:     General: No focal deficit present.     Mental Status: She is alert and oriented to person, place, and time.  Psychiatric:        Mood and Affect: Mood normal.        Behavior: Behavior normal.        Thought Content: Thought content normal.        Judgment: Judgment normal.          Assessment & Plan:   Problem List Items Addressed This Visit     Major depression - Primary    Chronic. Patient endorses side effects of tinnitus  and worsening diarrhea since starting the medication about 3 weeks ago. She is also experiencing negative thoughts and difficulty sleeping. Strongly recommend patient reach out to Dr. Starleen Arms office to discuss recommendations for medication. There are no alarm sx present at this time. Patient did have a good response to valizodone during the transition to the trintellix, therefore this may be an option to trial to see if it works for her.  There is a significant component of her emotional health around her weight.  I discussed with the patient that I have not heard of the studies of semaglutide for mental health purposes.   At this time, writing this medication for that purpose is unwarranted, however, she does have other chronic health issues that would warrant trial of use for management and lowering CVD risks.        Relevant Medications   Vilazodone HCl (VIIBRYD) 10 MG TABS   Vasomotor symptoms due to menopause    Patient endorses hot flashes that are particularly bothersome. She may benefit from venlafaxine for depression and anxiety, as this can also help with hot flashes. I do not want to make any changes to mental health medications today as she is being seen by psychiatry and with her history of difficult to treat symptoms I feel they are better equipped to make these changes. We will trial low dose clonidine to see if this is helpful for her symptoms.        Relevant Medications   cloNIDine (CATAPRES) 0.1 MG tablet   Pre-diabetes    Chronic. No alarm sx at this time. Given patients co-morbities with CAD, HLD with statin intolerance, Pre-DM, and HTN I do feel that trial of semaglutide may be helpful to reduce patients risk of long term complications. At this time her BMI is 26. We will send medication to pharmacy for evaluation. Patient is aware this medication will require prior authorization. We will continue to monitor.        Relevant Medications   Semaglutide,0.25 or 0.'5MG'$ /DOS,  (OZEMPIC, 0.25 OR 0.5 MG/DOSE,) 2 MG/3ML SOPN   Left wrist tendonitis    Symptoms and presentation consistent with tendonitis of the L wrist. No alarm sx present at this time. Mild weakness noted on grip. ROM intact, but painful.  Recommend immobilization and NSAIDs to help reduce inflammation and pain. In 2 weeks start with gentle stretching exercises. Continue immobilization at night. If no improvement of symptoms after 4 weeks, we will send to ortho for evaluation.  Hyperlipidemia   Relevant Medications   Semaglutide,0.25 or 0.'5MG'$ /DOS, (OZEMPIC, 0.25 OR 0.5 MG/DOSE,) 2 MG/3ML SOPN   cloNIDine (CATAPRES) 0.1 MG tablet   prazosin (MINIPRESS) 2 MG capsule   Coronary artery arteriosclerosis   Relevant Medications   Semaglutide,0.25 or 0.'5MG'$ /DOS, (OZEMPIC, 0.25 OR 0.5 MG/DOSE,) 2 MG/3ML SOPN   cloNIDine (CATAPRES) 0.1 MG tablet   prazosin (MINIPRESS) 2 MG capsule   Essential hypertension   Relevant Medications   Semaglutide,0.25 or 0.'5MG'$ /DOS, (OZEMPIC, 0.25 OR 0.5 MG/DOSE,) 2 MG/3ML SOPN   cloNIDine (CATAPRES) 0.1 MG tablet   prazosin (MINIPRESS) 2 MG capsule     Orma Render, DNP, AGNP-c 05/31/2022  7:25 AM

## 2022-05-30 NOTE — Patient Instructions (Addendum)
It was a pleasure seeing you today. I hope your time spent with [place] was pleasant and helpful. Please let us know if there is anything we can do to improve the service you receive.    I have sent in the order for Ozempic- we will monitor for approval for this.  I have also sent in for you to try the clonidine for hot flashes. Try 1/2 tab a day first to see if this is helpful.  I want you to wear the brace for about 2 weeks and then remove it during the day. You can try the exercises I have given you. Keep wearing this at bedtime for protection and prevention of recurrence.   If this is not better in 2-3 weeks, send me a message and we will get you in with the Orthopedic doctor.  Important Office Information Lab Results If labs were ordered, please note that you will see results through [place] as soon as they come available from [facility].  It takes up to 5 business days for the results to be routed to me and for me to review them once all of the lab results have come through from [facility]. I will make recommendations based on your results and send these through [place] or someone from the office will call you to discuss. If your labs are abnormal, we may contact you to schedule a visit to discuss the results and make recommendations.  If you have not heard from [place] within 5 business days or you have waited longer than a week and your lab results have not come through on [place], please feel free to call the office or send a message through [place] to follow-up on these labs.   Referrals If referrals were placed today, the office where the referral was sent will contact you either by phone or through [place] to set up scheduling. Please note that it can take up to a week for the referral office to contact you. If you do not hear from them in a week, please contact the referral office directly to inquire about scheduling.   Condition Treated If your condition worsens or you begin to have new symptoms,  please schedule a follow-up appointment for further evaluation. If you are not sure if an appointment is needed, you may call the office to leave a message for the nurse and someone will contact you with recommendations.  If you have an urgent or life threatening emergency, please do not call the office, but seek emergency evaluation by calling 911 or going to the nearest emergency room for evaluation.   MyChart and Phone Calls Please do not use MyChart for urgent messages. It may take up to 3 business days for MyChart messages to be read by staff and if they are unable to handle the request, an additional 3 business days for them to be routed to me and for my response.  Messages sent to the provider through [place] do not come directly to the provider, please allow time for these messages to be routed and for me to respond.  We get a large volume of MyChart messages daily and these are responded to in the order received.   For urgent messages, please call the office at [phone] and speak with the front office staff or leave a message on the line of my assistant for guidance.  We are seeing patients from the hours of 8:00 am through 5:00 pm and calls directly to the nurse may not be  answered immediately due to seeing patients, but your call will be returned as soon as possible.  Phone  messages received after 4:00 PM Monday through Thursday may not be returned until the following business day. Phone messages received after 11:00 AM on Friday may not be returned until Monday.   After Hours We share on call hours with providers from other offices. If you have an urgent need after hours that cannot wait until the next business day, please contact the on call provider by calling the office number. A nurse will speak with you and contact the provider if needed for recommendations.  If you have an urgent or life threatening emergency after hours, please do not call the on call provider, but seek  emergency evaluation by calling 911 or going to the nearest emergency room for evaluation.   Paperwork All paperwork requires a minimum of 5 days to complete and return to you or the designated personnel. Please keep this in mind when bringing in forms or sending requests for paperwork completion to the office.

## 2022-05-31 ENCOUNTER — Encounter (HOSPITAL_BASED_OUTPATIENT_CLINIC_OR_DEPARTMENT_OTHER): Payer: Self-pay | Admitting: Nurse Practitioner

## 2022-05-31 DIAGNOSIS — M778 Other enthesopathies, not elsewhere classified: Secondary | ICD-10-CM | POA: Insufficient documentation

## 2022-05-31 NOTE — Assessment & Plan Note (Signed)
Patient endorses hot flashes that are particularly bothersome. She may benefit from venlafaxine for depression and anxiety, as this can also help with hot flashes. I do not want to make any changes to mental health medications today as she is being seen by psychiatry and with her history of difficult to treat symptoms I feel they are better equipped to make these changes. We will trial low dose clonidine to see if this is helpful for her symptoms.

## 2022-05-31 NOTE — Assessment & Plan Note (Signed)
Symptoms and presentation consistent with tendonitis of the L wrist. No alarm sx present at this time. Mild weakness noted on grip. ROM intact, but painful.  Recommend immobilization and NSAIDs to help reduce inflammation and pain. In 2 weeks start with gentle stretching exercises. Continue immobilization at night. If no improvement of symptoms after 4 weeks, we will send to ortho for evaluation.

## 2022-05-31 NOTE — Assessment & Plan Note (Signed)
Chronic. Patient endorses side effects of tinnitus and worsening diarrhea since starting the medication about 3 weeks ago. She is also experiencing negative thoughts and difficulty sleeping. Strongly recommend patient reach out to Dr. Starleen Arms office to discuss recommendations for medication. There are no alarm sx present at this time. Patient did have a good response to valizodone during the transition to the trintellix, therefore this may be an option to trial to see if it works for her.  There is a significant component of her emotional health around her weight.  I discussed with the patient that I have not heard of the studies of semaglutide for mental health purposes.   At this time, writing this medication for that purpose is unwarranted, however, she does have other chronic health issues that would warrant trial of use for management and lowering CVD risks.

## 2022-05-31 NOTE — Assessment & Plan Note (Signed)
Chronic. No alarm sx at this time. Given patients co-morbities with CAD, HLD with statin intolerance, Pre-DM, and HTN I do feel that trial of semaglutide may be helpful to reduce patients risk of long term complications. At this time her BMI is 26. We will send medication to pharmacy for evaluation. Patient is aware this medication will require prior authorization. We will continue to monitor.

## 2022-06-06 DIAGNOSIS — D225 Melanocytic nevi of trunk: Secondary | ICD-10-CM | POA: Diagnosis not present

## 2022-06-06 DIAGNOSIS — D220 Melanocytic nevi of lip: Secondary | ICD-10-CM | POA: Diagnosis not present

## 2022-06-06 DIAGNOSIS — I788 Other diseases of capillaries: Secondary | ICD-10-CM | POA: Diagnosis not present

## 2022-06-06 DIAGNOSIS — L570 Actinic keratosis: Secondary | ICD-10-CM | POA: Diagnosis not present

## 2022-06-07 DIAGNOSIS — F330 Major depressive disorder, recurrent, mild: Secondary | ICD-10-CM | POA: Diagnosis not present

## 2022-06-07 DIAGNOSIS — F4312 Post-traumatic stress disorder, chronic: Secondary | ICD-10-CM | POA: Diagnosis not present

## 2022-06-09 ENCOUNTER — Other Ambulatory Visit (HOSPITAL_BASED_OUTPATIENT_CLINIC_OR_DEPARTMENT_OTHER): Payer: Self-pay

## 2022-06-10 ENCOUNTER — Other Ambulatory Visit (HOSPITAL_BASED_OUTPATIENT_CLINIC_OR_DEPARTMENT_OTHER): Payer: Self-pay

## 2022-06-13 ENCOUNTER — Other Ambulatory Visit (HOSPITAL_BASED_OUTPATIENT_CLINIC_OR_DEPARTMENT_OTHER): Payer: Self-pay

## 2022-06-14 ENCOUNTER — Other Ambulatory Visit (HOSPITAL_BASED_OUTPATIENT_CLINIC_OR_DEPARTMENT_OTHER): Payer: Self-pay

## 2022-06-17 ENCOUNTER — Ambulatory Visit (INDEPENDENT_AMBULATORY_CARE_PROVIDER_SITE_OTHER): Payer: Medicare Other | Admitting: Nurse Practitioner

## 2022-06-17 ENCOUNTER — Encounter (HOSPITAL_BASED_OUTPATIENT_CLINIC_OR_DEPARTMENT_OTHER): Payer: Self-pay | Admitting: Nurse Practitioner

## 2022-06-17 ENCOUNTER — Other Ambulatory Visit (HOSPITAL_BASED_OUTPATIENT_CLINIC_OR_DEPARTMENT_OTHER): Payer: Self-pay

## 2022-06-17 VITALS — BP 99/42 | HR 64 | Ht 63.0 in | Wt 146.4 lb

## 2022-06-17 DIAGNOSIS — F331 Major depressive disorder, recurrent, moderate: Secondary | ICD-10-CM

## 2022-06-17 DIAGNOSIS — I2510 Atherosclerotic heart disease of native coronary artery without angina pectoris: Secondary | ICD-10-CM | POA: Diagnosis not present

## 2022-06-17 DIAGNOSIS — R7303 Prediabetes: Secondary | ICD-10-CM | POA: Diagnosis not present

## 2022-06-17 DIAGNOSIS — E063 Autoimmune thyroiditis: Secondary | ICD-10-CM

## 2022-06-17 DIAGNOSIS — E782 Mixed hyperlipidemia: Secondary | ICD-10-CM | POA: Diagnosis not present

## 2022-06-17 DIAGNOSIS — I10 Essential (primary) hypertension: Secondary | ICD-10-CM

## 2022-06-17 DIAGNOSIS — Z789 Other specified health status: Secondary | ICD-10-CM

## 2022-06-17 DIAGNOSIS — E038 Other specified hypothyroidism: Secondary | ICD-10-CM | POA: Diagnosis not present

## 2022-06-17 DIAGNOSIS — F5101 Primary insomnia: Secondary | ICD-10-CM

## 2022-06-17 DIAGNOSIS — E559 Vitamin D deficiency, unspecified: Secondary | ICD-10-CM

## 2022-06-17 DIAGNOSIS — M79642 Pain in left hand: Secondary | ICD-10-CM

## 2022-06-17 LAB — POCT GLYCOSYLATED HEMOGLOBIN (HGB A1C)
HbA1c POC (<> result, manual entry): 5.7 % (ref 4.0–5.6)
HbA1c, POC (controlled diabetic range): 5.7 % (ref 0.0–7.0)
HbA1c, POC (prediabetic range): 5.7 % (ref 5.7–6.4)
Hemoglobin A1C: 5.7 % — AB (ref 4.0–5.6)

## 2022-06-17 MED ORDER — ZALEPLON 5 MG PO CAPS
5.0000 mg | ORAL_CAPSULE | Freq: Every evening | ORAL | 2 refills | Status: DC | PRN
  Filled 2022-06-17: qty 20, 20d supply, fill #0

## 2022-06-17 NOTE — Progress Notes (Signed)
Wendy Keeler, DNP, AGNP-c [place], [place] [address] [phone] Office [phone] Fax  ESTABLISHED PATIENT- Chronic Health and/or Follow-Up Visit  Blood pressure (!) 99/42, pulse 64, height '5\' 3"'$  (1.6 m), weight 146 lb 6.4 oz (66.4 kg), SpO2 99 %.  Follow-up (Patient presents for follow up. Ozempic is causing constipation.Her left hand is still bothering her, she would like a referral to specialist. Lip is still swollen she thinks it is allergies. )   HPI  Wendy Joyce  is a 76 y.o. year old female presenting today for evaluation and management of the following: Lip swollen Noticed yesterday morning when she woke up  She took benadryl and iced the lip, but it was still swollen this morning This has happened in the past and historically can linger for a week or so before going away No new food, drinks, detergents, soaps, make-up, medications or supplements She tells me it is uncomfortable and tender.  No burning or stinging to the area No blisters now or in the past She tells me that the skin has cracked open in the past because the swelling gets so big Constipation She reports that she is having "gut symptoms" in response to Ozempic She had diarrhea on Tuesday all morning long, but this stopped at noon.  She reports that now she feels constipated.  She tells me she had a small bowel movement on Wednesday and then got normal on Thursday, but now she tells me she is constipated and cannot get anything out but "rabbit pellets" She reports that she has a history of IBS and she feels like the ozempic has exacerbated this She tells me she takes miralax every night and she would try another dose during the day today to see if this will help relieve her symptoms Left hand pain She tells me that she continues to experience pain in the left wrist and into the palmar surface of the left hand. She endorses  flaring of symptoms that cause pain and weakness She denies any repetitive activities with the hand that could be exacerbating problems At her last visit we immobilized the hand and she started on NSAIDs to help reduce the inflammation and pain.  She has continued with immobilization at night and has started the hand exercises however the pain is still persistent. Hypertension She is currently taking clonidine and prazosin for control She denies chest pains, shortness of breath, dizziness, weakness She is experiencing some lower extremity edema this seems to be worse when she is traveling.  ROS All ROS negative with exception of what is listed in HPI  PHYSICAL EXAM Physical Exam Vitals and nursing note reviewed.  Constitutional:      General: She is not in acute distress.    Appearance: Normal appearance.  HENT:     Head: Normocephalic.  Eyes:     Extraocular Movements: Extraocular movements intact.     Conjunctiva/sclera: Conjunctivae normal.     Pupils: Pupils are equal, round, and reactive to light.  Neck:     Vascular: No carotid bruit.  Cardiovascular:     Rate and Rhythm: Normal rate and regular rhythm.     Pulses: Normal pulses.     Heart sounds: Normal heart sounds. No murmur heard. Pulmonary:     Effort: Pulmonary effort is normal.     Breath sounds: Normal breath sounds. No wheezing.  Abdominal:     General: Bowel sounds are normal. There is  no distension.     Palpations: Abdomen is soft.     Tenderness: There is no abdominal tenderness. There is no guarding.  Musculoskeletal:        General: Normal range of motion.     Cervical back: Normal range of motion and neck supple.     Right lower leg: No edema.     Left lower leg: No edema.     Comments: Mild lower extremity edema present  Lymphadenopathy:     Cervical: No cervical adenopathy.  Skin:    General: Skin is warm and dry.     Capillary Refill: Capillary refill takes less than 2 seconds.  Neurological:      General: No focal deficit present.     Mental Status: She is alert and oriented to person, place, and time.  Psychiatric:        Mood and Affect: Mood normal.        Behavior: Behavior normal.        Thought Content: Thought content normal.        Judgment: Judgment normal.     ASSESSMENT & PLAN Problem List Items Addressed This Visit     Hypothyroidism    Chronic.  We will repeat labs today for monitoring.  We will make changes to plan of care based on lab findings as appropriate.      Relevant Orders   Thyroid Panel With TSH (Completed)   CBC with Differential/Platelet (Completed)   Essential hypertension    Chronic.  Blood pressure on the lower side of normal in the office today.  Recommend close monitoring of blood pressure and notify the office immediately if blood pressure remains less than 100/50.  If blood pressure continues to remain low plan to stop clonidine.      Relevant Orders   POCT glycosylated hemoglobin (Hb A1C) (Completed)   CBC with Differential/Platelet (Completed)   Comprehensive metabolic panel (Completed)   Lipid panel (Completed)   Major depression - Primary    Chronic.  Recommend continuing to work with psychiatry for medication management and counseling services.  We will continue to monitor.  No alarm symptoms present today.      Relevant Orders   Thyroid Panel With TSH (Completed)   Statin not tolerated    Currently on Repatha.  Will monitor labs today.      Relevant Orders   CBC with Differential/Platelet (Completed)   Comprehensive metabolic panel (Completed)   Lipid panel (Completed)   Vitamin D deficiency    Chronic.  Labs today.      Relevant Orders   VITAMIN D 25 Hydroxy (Vit-D Deficiency, Fractures) (Completed)   Pre-diabetes    Chronic.  Patient was recently started on Ozempic however she has had some GI distress.  Encouraged the patient to continue with the medication and monitor closely as often times the symptoms will  stabilize after the first few weeks of starting the new medication and I do feel that this medication will be significantly beneficial to her overall cardiovascular health.      Relevant Orders   POCT glycosylated hemoglobin (Hb A1C) (Completed)   CBC with Differential/Platelet (Completed)   Comprehensive metabolic panel (Completed)   Lipid panel (Completed)   Pain of left hand    Symptoms and presentation consistent with tendinopathy of the left wrist and hand.  Immobilization, NSAIDs, and gentle stretching have not been effective to help with symptoms.  At this point recommend referral to orthopedics for further evaluation and  recommendations for treatment.  Patient is in agreement to this.  We will send referral today.      Relevant Orders   AMB referral to orthopedics   Hyperlipidemia   Relevant Orders   POCT glycosylated hemoglobin (Hb A1C) (Completed)   CBC with Differential/Platelet (Completed)   Comprehensive metabolic panel (Completed)   Lipid panel (Completed)   Coronary artery arteriosclerosis   Relevant Orders   POCT glycosylated hemoglobin (Hb A1C) (Completed)   CBC with Differential/Platelet (Completed)   Comprehensive metabolic panel (Completed)   Lipid panel (Completed)   Insomnia   Relevant Medications   zaleplon (SONATA) 5 MG capsule     FOLLOW-UP Return in about 6 months (around 12/17/2022) for Chronic conditions.   Wendy Keeler, DNP, AGNP-c 06/17/2022  9:34 AM

## 2022-06-18 LAB — CBC WITH DIFFERENTIAL/PLATELET
Basophils Absolute: 0.1 x10E3/uL (ref 0.0–0.2)
Basos: 1 %
EOS (ABSOLUTE): 0.2 x10E3/uL (ref 0.0–0.4)
Eos: 3 %
Hematocrit: 42.6 % (ref 34.0–46.6)
Hemoglobin: 14 g/dL (ref 11.1–15.9)
Immature Grans (Abs): 0 x10E3/uL (ref 0.0–0.1)
Immature Granulocytes: 0 %
Lymphocytes Absolute: 1.9 x10E3/uL (ref 0.7–3.1)
Lymphs: 34 %
MCH: 29.2 pg (ref 26.6–33.0)
MCHC: 32.9 g/dL (ref 31.5–35.7)
MCV: 89 fL (ref 79–97)
Monocytes Absolute: 0.4 x10E3/uL (ref 0.1–0.9)
Monocytes: 8 %
Neutrophils Absolute: 3 x10E3/uL (ref 1.4–7.0)
Neutrophils: 54 %
Platelets: 185 x10E3/uL (ref 150–450)
RBC: 4.79 x10E6/uL (ref 3.77–5.28)
RDW: 12.8 % (ref 11.7–15.4)
WBC: 5.6 x10E3/uL (ref 3.4–10.8)

## 2022-06-18 LAB — LIPID PANEL
Chol/HDL Ratio: 3 ratio (ref 0.0–4.4)
Cholesterol, Total: 207 mg/dL — ABNORMAL HIGH (ref 100–199)
HDL: 68 mg/dL
LDL Chol Calc (NIH): 114 mg/dL — ABNORMAL HIGH (ref 0–99)
Triglycerides: 145 mg/dL (ref 0–149)
VLDL Cholesterol Cal: 25 mg/dL (ref 5–40)

## 2022-06-18 LAB — COMPREHENSIVE METABOLIC PANEL
ALT: 11 IU/L (ref 0–32)
AST: 16 IU/L (ref 0–40)
Albumin/Globulin Ratio: 2 (ref 1.2–2.2)
Albumin: 4.5 g/dL (ref 3.7–4.7)
Alkaline Phosphatase: 48 IU/L (ref 44–121)
BUN/Creatinine Ratio: 26 (ref 12–28)
BUN: 19 mg/dL (ref 8–27)
Bilirubin Total: 0.3 mg/dL (ref 0.0–1.2)
CO2: 24 mmol/L (ref 20–29)
Calcium: 10 mg/dL (ref 8.7–10.3)
Chloride: 104 mmol/L (ref 96–106)
Creatinine, Ser: 0.73 mg/dL (ref 0.57–1.00)
Globulin, Total: 2.3 g/dL (ref 1.5–4.5)
Glucose: 83 mg/dL (ref 70–99)
Potassium: 5.1 mmol/L (ref 3.5–5.2)
Sodium: 142 mmol/L (ref 134–144)
Total Protein: 6.8 g/dL (ref 6.0–8.5)
eGFR: 87 mL/min/{1.73_m2} (ref >59)

## 2022-06-18 LAB — THYROID PANEL WITH TSH
Free Thyroxine Index: 3.3 (ref 1.2–4.9)
T3 Uptake Ratio: 32 % (ref 24–39)
T4, Total: 10.4 ug/dL (ref 4.5–12.0)
TSH: 0.238 u[IU]/mL — ABNORMAL LOW (ref 0.450–4.500)

## 2022-06-18 LAB — VITAMIN D 25 HYDROXY (VIT D DEFICIENCY, FRACTURES): Vit D, 25-Hydroxy: 51.7 ng/mL (ref 30.0–100.0)

## 2022-06-20 ENCOUNTER — Other Ambulatory Visit (HOSPITAL_BASED_OUTPATIENT_CLINIC_OR_DEPARTMENT_OTHER): Payer: Self-pay

## 2022-06-22 ENCOUNTER — Ambulatory Visit (INDEPENDENT_AMBULATORY_CARE_PROVIDER_SITE_OTHER): Payer: Medicare Other

## 2022-06-22 ENCOUNTER — Ambulatory Visit (INDEPENDENT_AMBULATORY_CARE_PROVIDER_SITE_OTHER): Payer: Medicare Other | Admitting: Orthopaedic Surgery

## 2022-06-22 DIAGNOSIS — M19049 Primary osteoarthritis, unspecified hand: Secondary | ICD-10-CM

## 2022-06-22 DIAGNOSIS — M79642 Pain in left hand: Secondary | ICD-10-CM | POA: Diagnosis not present

## 2022-06-22 DIAGNOSIS — M19042 Primary osteoarthritis, left hand: Secondary | ICD-10-CM | POA: Diagnosis not present

## 2022-06-22 NOTE — Progress Notes (Signed)
Chief Complaint: Left thumb base pain     History of Present Illness:    Wendy Joyce is a 76 y.o. female right-hand-dominant female presents with thenar eminence and thumb base pain which has been going on now for several months.  She states that yoga has been really flaring up.  She is member here scheduled for this and as result is found it hard to be less active with the weights and yoga.  She has not had any injections or bracing.  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  She is here today for further assessment    Surgical History:   None  PMH/PSH/Family History/Social History/Meds/Allergies:    Past Medical History:  Diagnosis Date  . Abdominal pain, generalized 01/29/2015  . Abnormal thyroid function test 02/25/2016  . ADHD (attention deficit hyperactivity disorder)   . Allergic rhinitis, unspecified 01/29/2015  . Ankle sprain 03/16/2016  . Anxiety 01/29/2015  . Arthritis    hands  . Chronic kidney disease 2012   kidney stones  . Constipation 01/29/2015  . Decreased libido 05/02/2018  . Disorder of the skin and subcutaneous tissue, unspecified 01/29/2015  . DJD (degenerative joint disease)    hand  . Dysthymia   . Endometriosis   . External otitis   . Family history of coronary artery disease 05/02/2018  . Fatigue due to excessive exertion 02/24/2016  . Fever blister   . Fibromyalgia 01/29/2015  . Frequency of micturition 01/29/2015  . Functional dyspepsia 01/29/2015  . Gastro-esophageal reflux disease without esophagitis 01/29/2015  . Headache(784.0)   . High cholesterol   . Hyperlipidemia 09/12/2016  . Hypersomnia   . Hypothyroidism   . IBS (irritable bowel syndrome) 05/02/2018  . Insomnia   . Internal hemorrhoids   . 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 Labial pain 11/30/2016  . Lactose intolerance 12/14/2016  . Lactose intolerance in  adult 09/12/2016  . 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 Left ureteral stone   . LP (lichen planus) 65/68/1275  . Major depressive disorder, single episode, unspecified   . Migraine with aura, not intractable, without status migrainosus   . Mixed hyperlipidemia   . Mouth pain 01/07/2016  . Osteoarthritis of hip   . Osteopenia determined by x-ray 03/02/2017  . Personal history of estrogen therapy   . Phlebitis and thrombophlebitis of the leg   . Pityriasis alba 01/29/2015  . Pure hypercholesterolemia 06/29/2016  . Rectum pain 09/12/2016  . Recurrent sinus infections 02/24/2016  . Rhinosinusitis 01/07/2016  . Rosacea   . Sinusitis 12/24/2015  . 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 Umbilical hernia without obstruction and without gangrene 01/29/2015  . Umbilical pain 17/00/1749  . Unspecified urinary incontinence 01/07/2016  . Urticaria   . UTI (urinary tract infection)   . 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 Vitamin D deficiency    Past Surgical History:  Procedure Laterality Date  . BREAST ENHANCEMENT SURGERY    . BREAST REDUCTION SURGERY Bilateral 2018  . COLONOSCOPY WITH PROPOFOL N/A 02/25/2013   Procedure: COLONOSCOPY WITH PROPOFOL;  Surgeon: Garlan Fair, MD;  Location: [facility];  Service: Endoscopy;  Laterality: N/A;  . mini facelift  2000  . NASAL  SINUS SURGERY    . REDUCTION MAMMAPLASTY    . ROTATOR CUFF REPAIR     Right  . ROTATOR CUFF REPAIR     Left  . SEPTOPLASTY WITH ETHMOIDECTOMY, AND MAXILLARY ANTROSTOMY Bilateral 2006  . TOTAL ABDOMINAL HYSTERECTOMY     ovaries remain  . TUBAL LIGATION    . umbicial hernia     Social History   Socioeconomic History  . Marital status: Married    Spouse name: Not on file  . Number of children: Not on file  . Years of education: Not on file  . Highest education level: Not on file  Occupational History  . Not on file  Tobacco Use  . Smoking status: Never  .  Smokeless tobacco: Never  Vaping Use  . Vaping Use: Not on file  Substance and Sexual Activity  . Alcohol use: Yes    Alcohol/week: 2.0 standard drinks of alcohol    Types: 2 Glasses of wine per week    Comment: every night  . Drug use: No  . Sexual activity: Yes    Partners: Male  Other Topics Concern  . Not on file  Social History Narrative   Married   Regular exercise: yes; 2 x a week with a trainer   Caffeine use: coffee daily   Social Determinants of Health   Financial Resource Strain: Not on file  Food Insecurity: Not on file  Transportation Needs: Not on file  Physical Activity: Not on file  Stress: Not on file  Social Connections: Not on file   Family History  Problem Relation Age of Onset  . Breast cancer Maternal Grandmother   . Stroke Father   . Hypertension Father   . Cancer Father    Allergies  Allergen Reactions  . Adhesive [Tape] Dermatitis  . Atovaquone-Proguanil Hcl Other (See Comments)  . Citrus Nausea And Vomiting  . Escitalopram Hives  . Escitalopram Oxalate Hives  . Ezetimibe-Simvastatin Other (See Comments)  . Niacin Other (See Comments)  . Niacin And Related     Rash and breathing breathing problems  . Rosuvastatin Other (See Comments)  . Amoxicillin Rash    Unsure of reaction/ was instructed not to take drug. Unknown reaction   . Hydrochlorothiazide Rash   Current Outpatient Medications  Medication Sig Dispense Refill  . acyclovir (ZOVIRAX) 200 MG capsule Take 1 capsule (200 mg total) by mouth 2 (two) times daily. Take as needed for symptoms. 60 capsule 5  . acyclovir ointment (ZOVIRAX) 5 % Apply 1 application. topically daily as needed (for fever blister).    Marland Kitchen albuterol (VENTOLIN HFA) 108 (90 Base) MCG/ACT inhaler Inhale 2 puffs into the lungs every 6 (six) hours as needed for wheezing or shortness of breath. 18 g 5  . ALPRAZolam (XANAX) 0.25 MG tablet 1 TO 2 PO QD PRN 30 tablet 2  . azelastine (ASTELIN) 0.1 % nasal spray Use in each  nostril as directed    . cetirizine (ZYRTEC) 10 MG tablet Take 10 mg by mouth daily.    . cloNIDine (CATAPRES) 0.1 MG tablet Take 1/2-1 tab by mouth once a day for hot flashes. 30 tablet 11  . Dextromethorphan-buPROPion ER (AUVELITY) 45-105 MG TBCR 1 PO QAM 30 tablet 1  . donepezil (ARICEPT) 5 MG tablet Take 1 tablet by mouth twice daily. 180 tablet 1  . estradiol (ESTRACE) 0.5 MG tablet Take 1 tablet (0.5 mg total) by mouth. 90 tablet 3  . Evolocumab with Infusor ([place])  420 MG/3.5ML SOCT Inject 420 mg into the skin every 30 (thirty) days. 3.5 mL 11  . fluticasone (FLONASE) 50 MCG/ACT nasal spray Place 1 spray into both nostrils daily.    Marland Kitchen levothyroxine (SYNTHROID) 112 MCG tablet Take 1 tablet (112 mcg total) by mouth daily. 90 tablet 3  . montelukast (SINGULAIR) 10 MG tablet Take 1 tablet (10 mg total) by mouth daily for allergies 90 tablet 0  . polyethylene glycol (MIRALAX / GLYCOLAX) 17 g packet Take 17 g by mouth daily. 14 each   . prazosin (MINIPRESS) 2 MG capsule 1 TO 3 PO QHS (Patient not taking: Reported on 06/17/2022) 90 capsule 5  . prazosin (MINIPRESS) 2 MG capsule 1 TO 3 PO QHS 90 capsule 5  . Semaglutide,0.25 or 0.'5MG'$ /DOS, (OZEMPIC, 0.25 OR 0.5 MG/DOSE,) 2 MG/3ML SOPN Inject 0.25 mg into the skin once a week for 28 days, THEN 0.5 mg once a week for 14 days. 3 mL 0  . Vilazodone HCl (VIIBRYD) 10 MG TABS 1 PO QD W/DINNER (Patient not taking: Reported on 06/17/2022) 30 tablet 1  . Vilazodone HCl (VIIBRYD) 10 MG TABS 1 PO QD W/DINNER 30 tablet 1  . zaleplon (SONATA) 5 MG capsule Take 1 capsule (5 mg total) by mouth at bedtime as needed for sleep. 20 capsule 2   No current facility-administered medications for this visit.   No results found.  Review of Systems:   A ROS was performed including pertinent positives and negatives as documented in the HPI.  Physical Exam :   Constitutional: NAD and appears stated age Neurological: Alert and oriented Psych:  Appropriate affect and cooperative There were no vitals taken for this visit.   Comprehensive Musculoskeletal Exam:    Tenderness palpation of the base of the left first CMC joint.  She has a positive grind test.  She is able to oppose the thumb quite well.  Makes full composite fist.  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   Assessment:   76 y.o. 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  She does have a upcoming trip to [place] planned and as result she would like an injection for temporary relief.  I do believe that she would also benefit from discussion of surgery for this as well.  She has had a previous thumb base CMC arthritis procedure on the right hand as well by an outside Psychologist, sport and exercise.  She is somewhat concerned as it took her quite a long time to recover from that although she would like to discuss possible surgery with Dr. Tempie Donning on the left.  Plan :    -Plan for referral to Dr. Tempie Donning for discussion of left first [facility] arthritis -Left thumb base injection performed today under ultrasound guidance with verbal consent obtained    Procedure Note  Patient: Wendy Joyce             Date of Birth: Jan 03, 1946           MRN: [id number]             Visit Date: 06/22/2022  Procedures: Visit Diagnoses:  1. Pain in left hand     Small Joint Inj on 06/22/2022 11:34 AM       I personally saw and evaluated the patient, and participated in the management and treatment plan.  Vanetta Mulders, MD Attending Physician, Orthopedic Surgery  This document was dictated using Dragon voice recognition software. A reasonable attempt at proof reading has been made to minimize errors.

## 2022-06-23 ENCOUNTER — Other Ambulatory Visit (HOSPITAL_BASED_OUTPATIENT_CLINIC_OR_DEPARTMENT_OTHER): Payer: Self-pay

## 2022-06-27 ENCOUNTER — Other Ambulatory Visit (HOSPITAL_BASED_OUTPATIENT_CLINIC_OR_DEPARTMENT_OTHER): Payer: Self-pay

## 2022-06-29 DIAGNOSIS — M79642 Pain in left hand: Secondary | ICD-10-CM | POA: Insufficient documentation

## 2022-06-29 NOTE — Assessment & Plan Note (Signed)
Chronic.  Recommend continuing to work with psychiatry for medication management and counseling services.  We will continue to monitor.  No alarm symptoms present today.

## 2022-06-29 NOTE — Assessment & Plan Note (Signed)
Symptoms and presentation consistent with tendinopathy of the left wrist and hand.  Immobilization, NSAIDs, and gentle stretching have not been effective to help with symptoms.  At this point recommend referral to orthopedics for further evaluation and recommendations for treatment.  Patient is in agreement to this.  We will send referral today.

## 2022-06-29 NOTE — Assessment & Plan Note (Signed)
Chronic.  Blood pressure on the lower side of normal in the office today.  Recommend close monitoring of blood pressure and notify the office immediately if blood pressure remains less than 100/50.  If blood pressure continues to remain low plan to stop clonidine.

## 2022-06-29 NOTE — Assessment & Plan Note (Signed)
Currently on [place].  Will monitor labs today.

## 2022-06-29 NOTE — Assessment & Plan Note (Signed)
Chronic.  We will repeat labs today for monitoring.  We will make changes to plan of care based on lab findings as appropriate.

## 2022-06-29 NOTE — Assessment & Plan Note (Signed)
Chronic.  Patient was recently started on Ozempic however she has had some GI distress.  Encouraged the patient to continue with the medication and monitor closely as often times the symptoms will stabilize after the first few weeks of starting the new medication and I do feel that this medication will be significantly beneficial to her overall cardiovascular health.

## 2022-06-29 NOTE — Assessment & Plan Note (Signed)
Chronic. Labs today.  

## 2022-06-30 DIAGNOSIS — F4312 Post-traumatic stress disorder, chronic: Secondary | ICD-10-CM | POA: Diagnosis not present

## 2022-06-30 DIAGNOSIS — F330 Major depressive disorder, recurrent, mild: Secondary | ICD-10-CM | POA: Diagnosis not present

## 2022-07-01 ENCOUNTER — Other Ambulatory Visit (HOSPITAL_BASED_OUTPATIENT_CLINIC_OR_DEPARTMENT_OTHER): Payer: Self-pay | Admitting: Nurse Practitioner

## 2022-07-01 ENCOUNTER — Other Ambulatory Visit (HOSPITAL_BASED_OUTPATIENT_CLINIC_OR_DEPARTMENT_OTHER): Payer: Self-pay

## 2022-07-01 DIAGNOSIS — I2510 Atherosclerotic heart disease of native coronary artery without angina pectoris: Secondary | ICD-10-CM

## 2022-07-01 DIAGNOSIS — R7303 Prediabetes: Secondary | ICD-10-CM

## 2022-07-01 DIAGNOSIS — E782 Mixed hyperlipidemia: Secondary | ICD-10-CM

## 2022-07-01 DIAGNOSIS — I10 Essential (primary) hypertension: Secondary | ICD-10-CM

## 2022-07-04 ENCOUNTER — Other Ambulatory Visit (HOSPITAL_BASED_OUTPATIENT_CLINIC_OR_DEPARTMENT_OTHER): Payer: Self-pay

## 2022-07-05 ENCOUNTER — Other Ambulatory Visit (HOSPITAL_BASED_OUTPATIENT_CLINIC_OR_DEPARTMENT_OTHER): Payer: Self-pay | Admitting: Nurse Practitioner

## 2022-07-05 ENCOUNTER — Other Ambulatory Visit (HOSPITAL_BASED_OUTPATIENT_CLINIC_OR_DEPARTMENT_OTHER): Payer: Self-pay

## 2022-07-05 DIAGNOSIS — E782 Mixed hyperlipidemia: Secondary | ICD-10-CM

## 2022-07-05 DIAGNOSIS — F330 Major depressive disorder, recurrent, mild: Secondary | ICD-10-CM | POA: Diagnosis not present

## 2022-07-05 DIAGNOSIS — F4312 Post-traumatic stress disorder, chronic: Secondary | ICD-10-CM | POA: Diagnosis not present

## 2022-07-05 DIAGNOSIS — I2510 Atherosclerotic heart disease of native coronary artery without angina pectoris: Secondary | ICD-10-CM

## 2022-07-05 DIAGNOSIS — R7303 Prediabetes: Secondary | ICD-10-CM

## 2022-07-05 DIAGNOSIS — I10 Essential (primary) hypertension: Secondary | ICD-10-CM

## 2022-07-05 MED ORDER — OZEMPIC (0.25 OR 0.5 MG/DOSE) 2 MG/3ML ~~LOC~~ SOPN
0.5000 mg | PEN_INJECTOR | SUBCUTANEOUS | 3 refills | Status: DC
  Filled 2022-07-05: qty 3, 28d supply, fill #0
  Filled 2022-08-10: qty 3, 28d supply, fill #1

## 2022-07-06 DIAGNOSIS — E063 Autoimmune thyroiditis: Secondary | ICD-10-CM | POA: Diagnosis not present

## 2022-07-06 DIAGNOSIS — E039 Hypothyroidism, unspecified: Secondary | ICD-10-CM | POA: Diagnosis not present

## 2022-07-06 DIAGNOSIS — Z7689 Persons encountering health services in other specified circumstances: Secondary | ICD-10-CM | POA: Diagnosis not present

## 2022-07-06 DIAGNOSIS — E782 Mixed hyperlipidemia: Secondary | ICD-10-CM | POA: Diagnosis not present

## 2022-07-11 ENCOUNTER — Encounter (HOSPITAL_BASED_OUTPATIENT_CLINIC_OR_DEPARTMENT_OTHER): Payer: Self-pay

## 2022-07-11 ENCOUNTER — Telehealth (HOSPITAL_BASED_OUTPATIENT_CLINIC_OR_DEPARTMENT_OTHER): Payer: Self-pay | Admitting: Nurse Practitioner

## 2022-07-11 ENCOUNTER — Other Ambulatory Visit (HOSPITAL_BASED_OUTPATIENT_CLINIC_OR_DEPARTMENT_OTHER): Payer: Self-pay | Admitting: Nurse Practitioner

## 2022-07-11 ENCOUNTER — Ambulatory Visit (HOSPITAL_BASED_OUTPATIENT_CLINIC_OR_DEPARTMENT_OTHER): Payer: Medicare Other

## 2022-07-11 ENCOUNTER — Other Ambulatory Visit (HOSPITAL_BASED_OUTPATIENT_CLINIC_OR_DEPARTMENT_OTHER): Payer: Self-pay

## 2022-07-11 NOTE — Telephone Encounter (Signed)
Pt came in for her scheduled nurse visit on 7/17. She states that she does not need to get labs done today being that she got the same ones taken at another facility. States that she will call them and have their office fax the results here. Nurse visit is canceled.

## 2022-07-12 ENCOUNTER — Other Ambulatory Visit (HOSPITAL_BASED_OUTPATIENT_CLINIC_OR_DEPARTMENT_OTHER): Payer: Self-pay

## 2022-07-12 ENCOUNTER — Other Ambulatory Visit (HOSPITAL_BASED_OUTPATIENT_CLINIC_OR_DEPARTMENT_OTHER): Payer: Self-pay | Admitting: Nurse Practitioner

## 2022-07-12 DIAGNOSIS — F3340 Major depressive disorder, recurrent, in remission, unspecified: Secondary | ICD-10-CM

## 2022-07-12 MED ORDER — VILAZODONE HCL 10 MG PO TABS
ORAL_TABLET | ORAL | 3 refills | Status: DC
  Filled 2022-07-12: qty 30, 30d supply, fill #0
  Filled 2022-08-16: qty 30, 30d supply, fill #1

## 2022-07-19 DIAGNOSIS — F330 Major depressive disorder, recurrent, mild: Secondary | ICD-10-CM | POA: Diagnosis not present

## 2022-07-19 DIAGNOSIS — F4312 Post-traumatic stress disorder, chronic: Secondary | ICD-10-CM | POA: Diagnosis not present

## 2022-07-20 ENCOUNTER — Other Ambulatory Visit (HOSPITAL_BASED_OUTPATIENT_CLINIC_OR_DEPARTMENT_OTHER): Payer: Self-pay

## 2022-07-25 ENCOUNTER — Other Ambulatory Visit (HOSPITAL_BASED_OUTPATIENT_CLINIC_OR_DEPARTMENT_OTHER): Payer: Self-pay | Admitting: Nurse Practitioner

## 2022-07-25 ENCOUNTER — Other Ambulatory Visit (HOSPITAL_BASED_OUTPATIENT_CLINIC_OR_DEPARTMENT_OTHER): Payer: Self-pay

## 2022-07-25 MED ORDER — MONTELUKAST SODIUM 10 MG PO TABS
10.0000 mg | ORAL_TABLET | Freq: Every day | ORAL | 0 refills | Status: DC
  Filled 2022-07-25: qty 90, 90d supply, fill #0

## 2022-08-01 ENCOUNTER — Ambulatory Visit (INDEPENDENT_AMBULATORY_CARE_PROVIDER_SITE_OTHER): Payer: BC Managed Care – PPO | Admitting: Nurse Practitioner

## 2022-08-01 DIAGNOSIS — Z91199 Patient's noncompliance with other medical treatment and regimen due to unspecified reason: Secondary | ICD-10-CM

## 2022-08-02 NOTE — Progress Notes (Signed)
Unable to contact patient at time of visit.

## 2022-08-08 ENCOUNTER — Other Ambulatory Visit (HOSPITAL_BASED_OUTPATIENT_CLINIC_OR_DEPARTMENT_OTHER): Payer: Self-pay

## 2022-08-09 ENCOUNTER — Other Ambulatory Visit (HOSPITAL_BASED_OUTPATIENT_CLINIC_OR_DEPARTMENT_OTHER): Payer: Self-pay

## 2022-08-10 ENCOUNTER — Telehealth (HOSPITAL_BASED_OUTPATIENT_CLINIC_OR_DEPARTMENT_OTHER): Payer: Self-pay | Admitting: Nurse Practitioner

## 2022-08-10 ENCOUNTER — Other Ambulatory Visit (HOSPITAL_BASED_OUTPATIENT_CLINIC_OR_DEPARTMENT_OTHER): Payer: Self-pay

## 2022-08-10 NOTE — Telephone Encounter (Signed)
Received a fax from covermymeds for  [place] .25 or 0.'5MG'$ /Dose '2mg'$ /3ML pen-injectors Key: [id number]

## 2022-08-11 ENCOUNTER — Other Ambulatory Visit (HOSPITAL_BASED_OUTPATIENT_CLINIC_OR_DEPARTMENT_OTHER): Payer: Self-pay

## 2022-08-11 MED ORDER — VALACYCLOVIR HCL 500 MG PO TABS
500.0000 mg | ORAL_TABLET | Freq: Two times a day (BID) | ORAL | 2 refills | Status: DC
  Filled 2022-08-11: qty 60, 30d supply, fill #0
  Filled 2022-09-08: qty 60, 30d supply, fill #1
  Filled 2022-10-10: qty 60, 30d supply, fill #2

## 2022-08-11 NOTE — Telephone Encounter (Signed)
Contacted by pharmacy. Patient is requesting an update on the status of her PA. After review of Cover my Meds, CMA has viewed, but not submitted PA.

## 2022-08-11 NOTE — Telephone Encounter (Signed)
Please advise the pt on the [place] for Ozempic

## 2022-08-12 ENCOUNTER — Other Ambulatory Visit (HOSPITAL_BASED_OUTPATIENT_CLINIC_OR_DEPARTMENT_OTHER): Payer: Self-pay

## 2022-08-12 ENCOUNTER — Encounter (HOSPITAL_BASED_OUTPATIENT_CLINIC_OR_DEPARTMENT_OTHER): Payer: Self-pay

## 2022-08-12 NOTE — Telephone Encounter (Signed)
Rec'd Fax from Cover my [place] in yellow folder

## 2022-08-16 ENCOUNTER — Other Ambulatory Visit (HOSPITAL_BASED_OUTPATIENT_CLINIC_OR_DEPARTMENT_OTHER): Payer: Self-pay

## 2022-08-16 DIAGNOSIS — F330 Major depressive disorder, recurrent, mild: Secondary | ICD-10-CM | POA: Diagnosis not present

## 2022-08-16 DIAGNOSIS — F4312 Post-traumatic stress disorder, chronic: Secondary | ICD-10-CM | POA: Diagnosis not present

## 2022-08-17 ENCOUNTER — Ambulatory Visit (HOSPITAL_BASED_OUTPATIENT_CLINIC_OR_DEPARTMENT_OTHER): Payer: Medicare Other

## 2022-08-22 ENCOUNTER — Other Ambulatory Visit (HOSPITAL_BASED_OUTPATIENT_CLINIC_OR_DEPARTMENT_OTHER): Payer: Self-pay

## 2022-08-23 ENCOUNTER — Other Ambulatory Visit (HOSPITAL_BASED_OUTPATIENT_CLINIC_OR_DEPARTMENT_OTHER): Payer: Self-pay

## 2022-08-23 ENCOUNTER — Ambulatory Visit (INDEPENDENT_AMBULATORY_CARE_PROVIDER_SITE_OTHER): Payer: Medicare Other | Admitting: Nurse Practitioner

## 2022-08-23 ENCOUNTER — Encounter (HOSPITAL_BASED_OUTPATIENT_CLINIC_OR_DEPARTMENT_OTHER): Payer: Self-pay | Admitting: Nurse Practitioner

## 2022-08-23 VITALS — BP 134/64 | HR 76 | Ht 63.0 in | Wt 140.0 lb

## 2022-08-23 DIAGNOSIS — R052 Subacute cough: Secondary | ICD-10-CM | POA: Diagnosis not present

## 2022-08-23 DIAGNOSIS — J111 Influenza due to unidentified influenza virus with other respiratory manifestations: Secondary | ICD-10-CM | POA: Diagnosis not present

## 2022-08-23 DIAGNOSIS — J029 Acute pharyngitis, unspecified: Secondary | ICD-10-CM

## 2022-08-23 LAB — POCT RAPID STREP A (OFFICE): Rapid Strep A Screen: NEGATIVE

## 2022-08-23 LAB — POCT INFLUENZA A/B
Influenza A, POC: NEGATIVE
Influenza B, POC: NEGATIVE

## 2022-08-23 MED ORDER — GUAIFENESIN-CODEINE 100-10 MG/5ML PO SYRP
5.0000 mL | ORAL_SOLUTION | Freq: Three times a day (TID) | ORAL | 0 refills | Status: DC | PRN
  Filled 2022-08-23: qty 180, 12d supply, fill #0

## 2022-08-23 MED ORDER — AZITHROMYCIN 250 MG PO TABS
ORAL_TABLET | ORAL | 0 refills | Status: AC
  Filled 2022-08-23: qty 6, 5d supply, fill #0

## 2022-08-23 NOTE — Patient Instructions (Signed)
It was a pleasure seeing you today. I hope your time spent with [place] was pleasant and helpful. Please let us know if there is anything we can do to improve the service you receive.   Today we discussed concerns with:  I have sent in an antibiotic called azithromycin today to help with your cough. I also sent in the cheratussin for you.  If you are not better in the next week, please let me know.   I will do some research on the heavy metals- thank you for sharing. I will be happy to help in this journey.     Important Office Information Lab Results If labs were ordered, please note that you will see results through [place] as soon as they come available from [place].  It takes up to 5 business days for the results to be routed to me and for me to review them once all of the lab results have come through from [facility]. I will make recommendations based on your results and send these through [place] or someone from the office will call you to discuss. If your labs are abnormal, we may contact you to schedule a visit to discuss the results and make recommendations.  If you have not heard from [place] within 5 business days or you have waited longer than a week and your lab results have not come through on [place], please feel free to call the office or send a message through [place] to follow-up on these labs.   Referrals If referrals were placed today, the office where the referral was sent will contact you either by phone or through [place] to set up scheduling. Please note that it can take up to a week for the referral office to contact you. If you do not hear from them in a week, please contact the referral office directly to inquire about scheduling.   Condition Treated If your condition worsens or you begin to have new symptoms, please schedule a follow-up appointment for further evaluation. If you are not sure if an appointment is needed, you may call the office to leave a message for the nurse and  someone will contact you with recommendations.  If you have an urgent or life threatening emergency, please do not call the office, but seek emergency evaluation by calling 911 or going to the nearest emergency room for evaluation.   MyChart and Phone Calls Please do not use MyChart for urgent messages. It may take up to 3 business days for MyChart messages to be read by staff and if they are unable to handle the request, an additional 3 business days for them to be routed to me and for my response.  Messages sent to the provider through [place] do not come directly to the provider, please allow time for these messages to be routed and for me to respond.  We get a large volume of MyChart messages daily and these are responded to in the order received.   For urgent messages, please call the office at [phone] and speak with the front office staff or leave a message on the line of my assistant for guidance.  We are seeing patients from the hours of 8:00 am through 5:00 pm and calls directly to the nurse may not be answered immediately due to seeing patients, but your call will be returned as soon as possible.  Phone  messages received after 4:00 PM Monday through Thursday may not be returned until the following business day. Phone  messages received after 11:00 AM on Friday may not be returned until Monday.   After Hours We share on call hours with providers from other offices. If you have an urgent need after hours that cannot wait until the next business day, please contact the on call provider by calling the office number. A nurse will speak with you and contact the provider if needed for recommendations.  If you have an urgent or life threatening emergency after hours, please do not call the on call provider, but seek emergency evaluation by calling 911 or going to the nearest emergency room for evaluation.   Paperwork All paperwork requires a minimum of 5 days to complete and return to you or  the designated personnel. Please keep this in mind when bringing in forms or sending requests for paperwork completion to the office.

## 2022-08-23 NOTE — Progress Notes (Signed)
Worthy Keeler, DNP, AGNP-c [address] [place], [place] [address] [phone] Office [phone] Fax  ESTABLISHED PATIENT- Chronic Health and/or Follow-Up Visit  Blood pressure 134/64, pulse 76, height '5\' 3"'$  (1.6 m), weight 140 lb (63.5 kg), SpO2 96 %.  Wendy Joyce- Functional Medicine Provider  HPI  Wendy Joyce  is a 76 y.o. year old female presenting today for evaluation and management of the following: Acute Visit (Patient has had cough, sore throat no fever. Husband was positive for COVID. She declined COVID test. Patient feels this is side effect of detoxin. She needs cough syrup Cheratussin AC syrup. )  Cough Wendy Joyce endorses a cough with no other associated symptoms for a few weeks She reports her husband has recently tested positive for COVID, she declines testing today She reports that she has been going through a detox with her functional medicine doctor and she feels the cough is associated with this.  She  recently had testing performed for heavy metal intoxication. She reports several very abnormal results that have shown up. She tells me that many of the symptoms that can come from these toxins she has  She has started taking supplements of Omega Genetics Neuro 1000, Chlorella Caps, Digestive Enzymes as these were recommended by her functional medicine provider.  She reports cheratussin has been effective for her cough in the past and she would like a refill on this today.   ROS All ROS negative with exception of what is listed in HPI  PHYSICAL EXAM Physical Exam Vitals and nursing note reviewed.  Constitutional:      Appearance: Normal appearance.  HENT:     Head: Normocephalic.     Right Ear: Tympanic membrane normal.     Left Ear: Tympanic membrane normal.     Nose: Congestion present.     Mouth/Throat:     Mouth: Mucous membranes are moist.     Pharynx: Posterior oropharyngeal  erythema present.  Eyes:     Extraocular Movements: Extraocular movements intact.     Pupils: Pupils are equal, round, and reactive to light.  Cardiovascular:     Rate and Rhythm: Normal rate and regular rhythm.     Pulses: Normal pulses.     Heart sounds: Normal heart sounds.  Pulmonary:     Effort: Pulmonary effort is normal.     Comments: Course lung sounds Abdominal:     General: Bowel sounds are normal.     Palpations: Abdomen is soft.  Musculoskeletal:     Cervical back: No tenderness.  Lymphadenopathy:     Cervical: Cervical adenopathy present.  Skin:    General: Skin is warm and dry.     Capillary Refill: Capillary refill takes less than 2 seconds.  Neurological:     General: No focal deficit present.     Mental Status: She is alert and oriented to person, place, and time.     Motor: No weakness.  Psychiatric:        Mood and Affect: Mood normal.        Behavior: Behavior normal.        Thought Content: Thought content normal.        Judgment: Judgment normal.     ASSESSMENT & PLAN Problem List Items Addressed This Visit     Influenza - Primary    Positive influenza test in office today. Given the length of time cough has been present, it is likely that this  was not initiated by the flu, but rather possibly related other infection. Unclear if detox has correlation at this time. It has been too long to start antiviral treatment. Given the ongoing cough, we will start azithromycin as her lungs are course sounding. Recommend rest and increased hydration. Supportive care discussed. Will f/u if sx worsen or fail to improve.       Relevant Orders   POCT Influenza A/B (Completed)   Other Visit Diagnoses     Sore throat       Relevant Medications   guaiFENesin-codeine (ROBITUSSIN AC) 100-10 MG/5ML syrup   Other Relevant Orders   POCT rapid strep A (Completed)   Subacute cough       Relevant Medications   guaiFENesin-codeine (ROBITUSSIN AC) 100-10 MG/5ML syrup       Return if symptoms worsen or fail to improve.    Worthy Keeler, DNP, AGNP-c 08/23/2022  2:34 PM

## 2022-08-24 DIAGNOSIS — F332 Major depressive disorder, recurrent severe without psychotic features: Secondary | ICD-10-CM | POA: Diagnosis not present

## 2022-08-30 DIAGNOSIS — F4312 Post-traumatic stress disorder, chronic: Secondary | ICD-10-CM | POA: Diagnosis not present

## 2022-08-30 DIAGNOSIS — F330 Major depressive disorder, recurrent, mild: Secondary | ICD-10-CM | POA: Diagnosis not present

## 2022-08-31 DIAGNOSIS — F332 Major depressive disorder, recurrent severe without psychotic features: Secondary | ICD-10-CM | POA: Diagnosis not present

## 2022-09-01 ENCOUNTER — Other Ambulatory Visit (HOSPITAL_BASED_OUTPATIENT_CLINIC_OR_DEPARTMENT_OTHER): Payer: Self-pay

## 2022-09-02 ENCOUNTER — Other Ambulatory Visit (HOSPITAL_BASED_OUTPATIENT_CLINIC_OR_DEPARTMENT_OTHER): Payer: Self-pay

## 2022-09-02 DIAGNOSIS — N3281 Overactive bladder: Secondary | ICD-10-CM | POA: Diagnosis not present

## 2022-09-02 DIAGNOSIS — N3946 Mixed incontinence: Secondary | ICD-10-CM | POA: Diagnosis not present

## 2022-09-02 DIAGNOSIS — N952 Postmenopausal atrophic vaginitis: Secondary | ICD-10-CM | POA: Diagnosis not present

## 2022-09-02 MED ORDER — ESTRADIOL 0.1 MG/GM VA CREA
TOPICAL_CREAM | VAGINAL | 3 refills | Status: DC
  Filled 2022-09-02: qty 42.5, 100d supply, fill #0
  Filled 2022-12-07: qty 42.5, 100d supply, fill #1

## 2022-09-05 ENCOUNTER — Other Ambulatory Visit (HOSPITAL_BASED_OUTPATIENT_CLINIC_OR_DEPARTMENT_OTHER): Payer: Self-pay

## 2022-09-05 ENCOUNTER — Other Ambulatory Visit: Payer: Self-pay | Admitting: Neurology

## 2022-09-05 ENCOUNTER — Other Ambulatory Visit (HOSPITAL_BASED_OUTPATIENT_CLINIC_OR_DEPARTMENT_OTHER): Payer: Self-pay | Admitting: Nurse Practitioner

## 2022-09-06 ENCOUNTER — Other Ambulatory Visit (HOSPITAL_BASED_OUTPATIENT_CLINIC_OR_DEPARTMENT_OTHER): Payer: Self-pay

## 2022-09-06 DIAGNOSIS — F332 Major depressive disorder, recurrent severe without psychotic features: Secondary | ICD-10-CM | POA: Diagnosis not present

## 2022-09-06 MED ORDER — DONEPEZIL HCL 5 MG PO TABS
5.0000 mg | ORAL_TABLET | Freq: Two times a day (BID) | ORAL | 1 refills | Status: DC
  Filled 2022-09-06: qty 180, 90d supply, fill #0
  Filled 2023-01-19: qty 180, 90d supply, fill #1

## 2022-09-07 DIAGNOSIS — F332 Major depressive disorder, recurrent severe without psychotic features: Secondary | ICD-10-CM | POA: Diagnosis not present

## 2022-09-08 ENCOUNTER — Other Ambulatory Visit (HOSPITAL_BASED_OUTPATIENT_CLINIC_OR_DEPARTMENT_OTHER): Payer: Self-pay

## 2022-09-08 DIAGNOSIS — N3946 Mixed incontinence: Secondary | ICD-10-CM | POA: Diagnosis not present

## 2022-09-08 DIAGNOSIS — N3281 Overactive bladder: Secondary | ICD-10-CM | POA: Diagnosis not present

## 2022-09-08 DIAGNOSIS — F332 Major depressive disorder, recurrent severe without psychotic features: Secondary | ICD-10-CM | POA: Diagnosis not present

## 2022-09-09 DIAGNOSIS — F332 Major depressive disorder, recurrent severe without psychotic features: Secondary | ICD-10-CM | POA: Diagnosis not present

## 2022-09-12 DIAGNOSIS — F332 Major depressive disorder, recurrent severe without psychotic features: Secondary | ICD-10-CM | POA: Diagnosis not present

## 2022-09-13 DIAGNOSIS — F332 Major depressive disorder, recurrent severe without psychotic features: Secondary | ICD-10-CM | POA: Diagnosis not present

## 2022-09-14 DIAGNOSIS — F4312 Post-traumatic stress disorder, chronic: Secondary | ICD-10-CM | POA: Diagnosis not present

## 2022-09-14 DIAGNOSIS — F332 Major depressive disorder, recurrent severe without psychotic features: Secondary | ICD-10-CM | POA: Diagnosis not present

## 2022-09-14 DIAGNOSIS — F330 Major depressive disorder, recurrent, mild: Secondary | ICD-10-CM | POA: Diagnosis not present

## 2022-09-18 DIAGNOSIS — J111 Influenza due to unidentified influenza virus with other respiratory manifestations: Secondary | ICD-10-CM | POA: Insufficient documentation

## 2022-09-18 NOTE — Assessment & Plan Note (Signed)
Positive influenza test in office today. Given the length of time cough has been present, it is likely that this was not initiated by the flu, but rather possibly related other infection. Unclear if detox has correlation at this time. It has been too long to start antiviral treatment. Given the ongoing cough, we will start azithromycin as her lungs are course sounding. Recommend rest and increased hydration. Supportive care discussed. Will f/u if sx worsen or fail to improve.

## 2022-09-19 ENCOUNTER — Other Ambulatory Visit (HOSPITAL_BASED_OUTPATIENT_CLINIC_OR_DEPARTMENT_OTHER): Payer: Self-pay

## 2022-09-20 DIAGNOSIS — F332 Major depressive disorder, recurrent severe without psychotic features: Secondary | ICD-10-CM | POA: Diagnosis not present

## 2022-09-20 DIAGNOSIS — H26493 Other secondary cataract, bilateral: Secondary | ICD-10-CM | POA: Diagnosis not present

## 2022-09-20 DIAGNOSIS — H524 Presbyopia: Secondary | ICD-10-CM | POA: Diagnosis not present

## 2022-09-20 DIAGNOSIS — Z961 Presence of intraocular lens: Secondary | ICD-10-CM | POA: Diagnosis not present

## 2022-09-21 DIAGNOSIS — F332 Major depressive disorder, recurrent severe without psychotic features: Secondary | ICD-10-CM | POA: Diagnosis not present

## 2022-09-22 DIAGNOSIS — F332 Major depressive disorder, recurrent severe without psychotic features: Secondary | ICD-10-CM | POA: Diagnosis not present

## 2022-09-23 DIAGNOSIS — F332 Major depressive disorder, recurrent severe without psychotic features: Secondary | ICD-10-CM | POA: Diagnosis not present

## 2022-09-26 ENCOUNTER — Other Ambulatory Visit (HOSPITAL_BASED_OUTPATIENT_CLINIC_OR_DEPARTMENT_OTHER): Payer: Self-pay

## 2022-09-26 DIAGNOSIS — F332 Major depressive disorder, recurrent severe without psychotic features: Secondary | ICD-10-CM | POA: Diagnosis not present

## 2022-09-27 DIAGNOSIS — F332 Major depressive disorder, recurrent severe without psychotic features: Secondary | ICD-10-CM | POA: Diagnosis not present

## 2022-09-27 DIAGNOSIS — F330 Major depressive disorder, recurrent, mild: Secondary | ICD-10-CM | POA: Diagnosis not present

## 2022-09-27 DIAGNOSIS — F4312 Post-traumatic stress disorder, chronic: Secondary | ICD-10-CM | POA: Diagnosis not present

## 2022-09-28 DIAGNOSIS — F332 Major depressive disorder, recurrent severe without psychotic features: Secondary | ICD-10-CM | POA: Diagnosis not present

## 2022-09-29 DIAGNOSIS — F332 Major depressive disorder, recurrent severe without psychotic features: Secondary | ICD-10-CM | POA: Diagnosis not present

## 2022-09-30 DIAGNOSIS — F332 Major depressive disorder, recurrent severe without psychotic features: Secondary | ICD-10-CM | POA: Diagnosis not present

## 2022-10-03 ENCOUNTER — Other Ambulatory Visit (HOSPITAL_BASED_OUTPATIENT_CLINIC_OR_DEPARTMENT_OTHER): Payer: Self-pay

## 2022-10-03 DIAGNOSIS — F332 Major depressive disorder, recurrent severe without psychotic features: Secondary | ICD-10-CM | POA: Diagnosis not present

## 2022-10-04 DIAGNOSIS — F332 Major depressive disorder, recurrent severe without psychotic features: Secondary | ICD-10-CM | POA: Diagnosis not present

## 2022-10-05 ENCOUNTER — Other Ambulatory Visit (HOSPITAL_BASED_OUTPATIENT_CLINIC_OR_DEPARTMENT_OTHER): Payer: Self-pay

## 2022-10-05 DIAGNOSIS — H26491 Other secondary cataract, right eye: Secondary | ICD-10-CM | POA: Diagnosis not present

## 2022-10-05 MED ORDER — PROGESTERONE MICRONIZED 100 MG PO CAPS
ORAL_CAPSULE | ORAL | 1 refills | Status: DC
  Filled 2022-10-05: qty 90, 48d supply, fill #0
  Filled 2022-11-16: qty 90, 45d supply, fill #1

## 2022-10-06 ENCOUNTER — Other Ambulatory Visit (HOSPITAL_BASED_OUTPATIENT_CLINIC_OR_DEPARTMENT_OTHER): Payer: Self-pay

## 2022-10-06 DIAGNOSIS — F332 Major depressive disorder, recurrent severe without psychotic features: Secondary | ICD-10-CM | POA: Diagnosis not present

## 2022-10-06 MED ORDER — INFLUENZA VAC A&B SA ADJ QUAD 0.5 ML IM PRSY
PREFILLED_SYRINGE | INTRAMUSCULAR | 0 refills | Status: DC
  Filled 2022-10-06: qty 0.5, 1d supply, fill #0

## 2022-10-06 MED ORDER — COVID-19 MRNA 2023-2024 VACCINE (COMIRNATY) 0.3 ML INJECTION
INTRAMUSCULAR | 0 refills | Status: DC
  Filled 2022-10-06: qty 0.3, 1d supply, fill #0

## 2022-10-07 DIAGNOSIS — N3941 Urge incontinence: Secondary | ICD-10-CM | POA: Diagnosis not present

## 2022-10-07 DIAGNOSIS — F332 Major depressive disorder, recurrent severe without psychotic features: Secondary | ICD-10-CM | POA: Diagnosis not present

## 2022-10-07 DIAGNOSIS — N3281 Overactive bladder: Secondary | ICD-10-CM | POA: Diagnosis not present

## 2022-10-07 DIAGNOSIS — N952 Postmenopausal atrophic vaginitis: Secondary | ICD-10-CM | POA: Diagnosis not present

## 2022-10-10 ENCOUNTER — Other Ambulatory Visit (HOSPITAL_BASED_OUTPATIENT_CLINIC_OR_DEPARTMENT_OTHER): Payer: Self-pay

## 2022-10-10 DIAGNOSIS — F332 Major depressive disorder, recurrent severe without psychotic features: Secondary | ICD-10-CM | POA: Diagnosis not present

## 2022-10-11 DIAGNOSIS — F332 Major depressive disorder, recurrent severe without psychotic features: Secondary | ICD-10-CM | POA: Diagnosis not present

## 2022-10-11 DIAGNOSIS — F330 Major depressive disorder, recurrent, mild: Secondary | ICD-10-CM | POA: Diagnosis not present

## 2022-10-11 DIAGNOSIS — F4312 Post-traumatic stress disorder, chronic: Secondary | ICD-10-CM | POA: Diagnosis not present

## 2022-10-12 DIAGNOSIS — H26492 Other secondary cataract, left eye: Secondary | ICD-10-CM | POA: Diagnosis not present

## 2022-10-13 DIAGNOSIS — F332 Major depressive disorder, recurrent severe without psychotic features: Secondary | ICD-10-CM | POA: Diagnosis not present

## 2022-10-14 ENCOUNTER — Encounter: Payer: Self-pay | Admitting: Plastic Surgery

## 2022-10-14 ENCOUNTER — Ambulatory Visit (INDEPENDENT_AMBULATORY_CARE_PROVIDER_SITE_OTHER): Payer: Self-pay | Admitting: Plastic Surgery

## 2022-10-14 DIAGNOSIS — Z719 Counseling, unspecified: Secondary | ICD-10-CM

## 2022-10-14 DIAGNOSIS — F332 Major depressive disorder, recurrent severe without psychotic features: Secondary | ICD-10-CM | POA: Diagnosis not present

## 2022-10-14 NOTE — Progress Notes (Signed)

## 2022-10-17 ENCOUNTER — Other Ambulatory Visit (HOSPITAL_BASED_OUTPATIENT_CLINIC_OR_DEPARTMENT_OTHER): Payer: Self-pay

## 2022-10-17 ENCOUNTER — Other Ambulatory Visit (HOSPITAL_BASED_OUTPATIENT_CLINIC_OR_DEPARTMENT_OTHER): Payer: Self-pay | Admitting: Nurse Practitioner

## 2022-10-17 DIAGNOSIS — F332 Major depressive disorder, recurrent severe without psychotic features: Secondary | ICD-10-CM | POA: Diagnosis not present

## 2022-10-17 MED ORDER — MONTELUKAST SODIUM 10 MG PO TABS
10.0000 mg | ORAL_TABLET | Freq: Every day | ORAL | 0 refills | Status: DC
  Filled 2023-02-06: qty 90, 90d supply, fill #0

## 2022-10-18 DIAGNOSIS — F332 Major depressive disorder, recurrent severe without psychotic features: Secondary | ICD-10-CM | POA: Diagnosis not present

## 2022-10-19 DIAGNOSIS — F332 Major depressive disorder, recurrent severe without psychotic features: Secondary | ICD-10-CM | POA: Diagnosis not present

## 2022-10-20 DIAGNOSIS — F332 Major depressive disorder, recurrent severe without psychotic features: Secondary | ICD-10-CM | POA: Diagnosis not present

## 2022-10-21 DIAGNOSIS — F332 Major depressive disorder, recurrent severe without psychotic features: Secondary | ICD-10-CM | POA: Diagnosis not present

## 2022-10-24 DIAGNOSIS — F332 Major depressive disorder, recurrent severe without psychotic features: Secondary | ICD-10-CM | POA: Diagnosis not present

## 2022-10-25 DIAGNOSIS — F332 Major depressive disorder, recurrent severe without psychotic features: Secondary | ICD-10-CM | POA: Diagnosis not present

## 2022-10-26 ENCOUNTER — Ambulatory Visit (INDEPENDENT_AMBULATORY_CARE_PROVIDER_SITE_OTHER): Payer: Medicare Other | Admitting: Orthopaedic Surgery

## 2022-10-26 DIAGNOSIS — F332 Major depressive disorder, recurrent severe without psychotic features: Secondary | ICD-10-CM | POA: Diagnosis not present

## 2022-10-26 DIAGNOSIS — M19049 Primary osteoarthritis, unspecified hand: Secondary | ICD-10-CM

## 2022-10-26 DIAGNOSIS — M65312 Trigger thumb, left thumb: Secondary | ICD-10-CM | POA: Diagnosis not present

## 2022-10-26 DIAGNOSIS — M79642 Pain in left hand: Secondary | ICD-10-CM

## 2022-10-26 DIAGNOSIS — M19042 Primary osteoarthritis, left hand: Secondary | ICD-10-CM | POA: Diagnosis not present

## 2022-10-26 NOTE — Progress Notes (Signed)
Chief Complaint: Left thumb base pain     History of Present Illness:   10/26/2022: Presents today as she would like an additional injection for the left thumb base.  She did get significant relief for several months from her last 1.  She is hoping to undergo [facility] arthroplasty this upcoming January.  Wendy Joyce is a 76 y.o. female right-hand-dominant female presents with thenar eminence and thumb base pain which has been going on now for several months.  She states that yoga has been really flaring up.  She is member here scheduled for this and as result is found it hard to be less active with the weights and yoga.  She has not had any injections or bracing.  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  She is here today for further assessment    Surgical History:   None  PMH/PSH/Family History/Social History/Meds/Allergies:    Past Medical History:  Diagnosis Date   Abdominal pain, generalized 01/29/2015   Abnormal thyroid function test 02/25/2016   ADHD (attention deficit hyperactivity disorder)    Allergic rhinitis, unspecified 01/29/2015   Ankle sprain 03/16/2016   Anxiety 01/29/2015   Arthritis    hands   Chronic kidney disease 2012   kidney stones   Constipation 01/29/2015   Decreased libido 05/02/2018   Disorder of the skin and subcutaneous tissue, unspecified 01/29/2015   DJD (degenerative joint disease)    hand   Dysthymia    Endometriosis    External otitis    Family history of coronary artery disease 05/02/2018   Fatigue due to excessive exertion 02/24/2016   Fever blister    Fibromyalgia 01/29/2015   Frequency of micturition 01/29/2015   Functional dyspepsia 01/29/2015   Gastro-esophageal reflux disease without esophagitis 01/29/2015   Headache(784.0)    High cholesterol    Hyperlipidemia 09/12/2016   Hypersomnia    Hypothyroidism    IBS (irritable bowel syndrome)  05/02/2018   Insomnia    Internal hemorrhoids    Irritable 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 01/29/2015   Labial pain 11/30/2016   Lactose intolerance 12/14/2016   Lactose intolerance in adult 09/12/2016   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   Left ureteral stone    LP (lichen planus) 63/89/3734   Major depressive disorder, single episode, unspecified    Migraine with aura, not intractable, without status migrainosus    Mixed hyperlipidemia    Mouth pain 01/07/2016   Osteoarthritis of hip    Osteopenia determined by x-ray 03/02/2017   Personal history of estrogen therapy    Phlebitis and thrombophlebitis of the leg    Pityriasis alba 01/29/2015   Pure hypercholesterolemia 06/29/2016   Rectum pain 09/12/2016   Recurrent sinus infections 02/24/2016   Rhinosinusitis 01/07/2016   Rosacea    Sinusitis 12/24/2015   Tinnitus of left ear 28/76/8115   Umbilical hernia without obstruction and without gangrene 72/62/0355   Umbilical pain 97/41/6384   Unspecified urinary incontinence 01/07/2016   Urticaria    UTI (urinary tract infection)    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   Vitamin D deficiency    Past Surgical History:  Procedure Laterality Date   BREAST ENHANCEMENT SURGERY     BREAST REDUCTION SURGERY Bilateral  2018   COLONOSCOPY WITH PROPOFOL N/A 02/25/2013   Procedure: COLONOSCOPY WITH PROPOFOL;  Surgeon: Garlan Fair, MD;  Location: [facility];  Service: Endoscopy;  Laterality: N/A;   mini facelift  2000   NASAL SINUS SURGERY     REDUCTION MAMMAPLASTY     ROTATOR CUFF REPAIR     Right   ROTATOR CUFF REPAIR     Left   SEPTOPLASTY WITH ETHMOIDECTOMY, AND MAXILLARY ANTROSTOMY Bilateral 2006   TOTAL ABDOMINAL HYSTERECTOMY     ovaries remain   TUBAL LIGATION     umbicial hernia     Social History   Socioeconomic History   Marital status: Married    Spouse name: Not on file   Number of children: Not on file   Years of  education: Not on file   Highest education level: Not on file  Occupational History   Not on file  Tobacco Use   Smoking status: Never   Smokeless tobacco: Never  Vaping Use   Vaping Use: Not on file  Substance and Sexual Activity   Alcohol use: Yes    Alcohol/week: 2.0 standard drinks of alcohol    Types: 2 Glasses of wine per week    Comment: every night   Drug use: No   Sexual activity: Yes    Partners: Male  Other Topics Concern   Not on file  Social History Narrative   Married   Regular exercise: yes; 2 x a week with a trainer   Caffeine use: coffee daily   Social Determinants of Radio broadcast assistant Strain: Not on file  Food Insecurity: Not on file  Transportation Needs: Not on file  Physical Activity: Not on file  Stress: Not on file  Social Connections: Not on file   Family History  Problem Relation Age of Onset   Breast cancer Maternal Grandmother    Stroke Father    Hypertension Father    Cancer Father    Allergies  Allergen Reactions   Adhesive [Tape] Dermatitis   Atovaquone-Proguanil Hcl Other (See Comments)   Bioflavonoids    Citrus Nausea And Vomiting   Escitalopram Hives   Escitalopram Oxalate Hives   Ezetimibe-Simvastatin Other (See Comments)   Niacin Other (See Comments)   Niacin And Related     Rash and breathing breathing problems   Rosuvastatin Other (See Comments)   Amoxicillin Rash    Unsure of reaction/ was instructed not to take drug. Unknown reaction    Hydrochlorothiazide Rash   Current Outpatient Medications  Medication Sig Dispense Refill   acyclovir ointment (ZOVIRAX) 5 % Apply 1 application. topically daily as needed (for fever blister).     albuterol (VENTOLIN HFA) 108 (90 Base) MCG/ACT inhaler Inhale 2 puffs into the lungs every 6 (six) hours as needed for wheezing or shortness of breath. 18 g 5   ALPRAZolam (XANAX) 0.25 MG tablet Take 1 to 2 tablets by mouth once daily as needed. Must last 30 days. 30 tablet 2    azelastine (ASTELIN) 0.1 % nasal spray Use in each nostril as directed     cetirizine (ZYRTEC) 10 MG tablet Take 10 mg by mouth daily.     cloNIDine (CATAPRES) 0.1 MG tablet Take 1/2-1 tab by mouth once a day for hot flashes. 30 tablet 11   COVID-19 mRNA vaccine 2023-2024 (COMIRNATY) SUSP injection Inject into the muscle. 0.3 mL 0   donepezil (ARICEPT) 5 MG tablet Take 1 tablet (5 mg total) by mouth 2 (  two) times daily. 180 tablet 1   estradiol (ESTRACE) 0.1 MG/GM vaginal cream Insert blueberry size amount of cream on finger (or 0.5 grams with applicator) in vagina daily for 2 weeks, THEN twice weekly. 42.5 g 3   estradiol (ESTRACE) 0.5 MG tablet Take 1 tablet (0.5 mg total) by mouth. 90 tablet 3   Evolocumab with Infusor (REPATHA PUSHTRONEX SYSTEM) 420 MG/3.5ML SOCT Inject 420 mg into the skin every 30 (thirty) days. 3.5 mL 11   fluticasone (FLONASE) 50 MCG/ACT nasal spray Place 1 spray into both nostrils daily.     guaiFENesin-codeine (ROBITUSSIN AC) 100-10 MG/5ML syrup Take 5 mLs by mouth 3 (three) times daily as needed for cough. 180 mL 0   influenza vaccine adjuvanted (FLUAD QUADRIVALENT) 0.5 ML injection TO BE ADMINISTERED BY PHARMACIST FOR IMMUNIZATION     influenza vaccine adjuvanted (FLUAD) 0.5 ML injection Inject into the muscle. 0.5 mL 0   levothyroxine (SYNTHROID) 112 MCG tablet Take 1 tablet (112 mcg total) by mouth daily. 90 tablet 3   montelukast (SINGULAIR) 10 MG tablet Take 1 tablet (10 mg total) by mouth daily for allergies 90 tablet 0   polyethylene glycol (MIRALAX / GLYCOLAX) 17 g packet Take 17 g by mouth daily. 14 each    progesterone (PROMETRIUM) 100 MG capsule Take 1 capsule (100 mg total) by mouth at bedtime for 7 days, THEN 2 capsules (200 mg total) at bedtime to help with sleep. 90 capsule 1   valACYclovir (VALTREX) 500 MG tablet Take 1 tablet (500 mg total) by mouth 2 (two) times daily. 60 tablet 2   zaleplon (SONATA) 5 MG capsule Take 1 capsule (5 mg total) by mouth at  bedtime as needed for sleep. 20 capsule 2   Zoster Vaccine Adjuvanted [facility]) injection      No current facility-administered medications for this visit.   No results found.  Review of Systems:   A ROS was performed including pertinent positives and negatives as documented in the HPI.  Physical Exam :   Constitutional: NAD and appears stated age Neurological: Alert and oriented Psych: Appropriate affect and cooperative There were no vitals taken for this visit.   Comprehensive Musculoskeletal Exam:    Tenderness palpation of the base of the left first CMC joint.  She has a positive grind test.  She is able to oppose the thumb quite well.  Makes full composite fist.  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   Assessment:   76 y.o. 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  She would like an additional injection today.  I did discuss with her that the neck step would be [facility] arthroplasty.  She would like to proceed with this.  We will plan for referral.  Plan :    -Plan for referral to Dr. Tempie Donning for discussion of left first [facility] arthritis -Left thumb base injection performed today under ultrasound guidance with verbal consent obtained    Procedure Note  Patient: Wendy Joyce             Date of Birth: 1946-05-16           MRN: [id number]             Visit Date: 10/26/2022  Procedures: Visit Diagnoses:  1. CMC arthritis     Small Joint Inj:  L thumb CMC on 10/26/2022 1:46 PM       I personally saw and evaluated the patient, and participated in the management and treatment plan.  Vanetta Mulders, MD Attending Physician, Orthopedic Surgery  This document was dictated using Dragon voice recognition software. A reasonable attempt at proof reading has been made to minimize errors.

## 2022-10-27 DIAGNOSIS — F332 Major depressive disorder, recurrent severe without psychotic features: Secondary | ICD-10-CM | POA: Diagnosis not present

## 2022-10-28 DIAGNOSIS — F332 Major depressive disorder, recurrent severe without psychotic features: Secondary | ICD-10-CM | POA: Diagnosis not present

## 2022-10-31 ENCOUNTER — Other Ambulatory Visit (HOSPITAL_BASED_OUTPATIENT_CLINIC_OR_DEPARTMENT_OTHER): Payer: Self-pay

## 2022-10-31 DIAGNOSIS — F332 Major depressive disorder, recurrent severe without psychotic features: Secondary | ICD-10-CM | POA: Diagnosis not present

## 2022-11-01 ENCOUNTER — Telehealth: Payer: Self-pay | Admitting: Orthopaedic Surgery

## 2022-11-01 NOTE — Telephone Encounter (Signed)
Patient states that Dr Sammuel Hines was supposed to send her a referral to dr benfeild. Please contact pt [phone]

## 2022-11-02 DIAGNOSIS — F332 Major depressive disorder, recurrent severe without psychotic features: Secondary | ICD-10-CM | POA: Diagnosis not present

## 2022-11-02 NOTE — Telephone Encounter (Signed)
Refaxed to Dr. Tempie Donning at [facility]

## 2022-11-04 DIAGNOSIS — F332 Major depressive disorder, recurrent severe without psychotic features: Secondary | ICD-10-CM | POA: Diagnosis not present

## 2022-11-07 ENCOUNTER — Other Ambulatory Visit (HOSPITAL_BASED_OUTPATIENT_CLINIC_OR_DEPARTMENT_OTHER): Payer: Self-pay

## 2022-11-07 MED ORDER — AREXVY 120 MCG/0.5ML IM SUSR
INTRAMUSCULAR | 0 refills | Status: DC
  Filled 2022-11-07: qty 0.5, 1d supply, fill #0

## 2022-11-08 DIAGNOSIS — F4312 Post-traumatic stress disorder, chronic: Secondary | ICD-10-CM | POA: Diagnosis not present

## 2022-11-08 DIAGNOSIS — F330 Major depressive disorder, recurrent, mild: Secondary | ICD-10-CM | POA: Diagnosis not present

## 2022-11-10 ENCOUNTER — Other Ambulatory Visit (HOSPITAL_BASED_OUTPATIENT_CLINIC_OR_DEPARTMENT_OTHER): Payer: Self-pay

## 2022-11-10 ENCOUNTER — Encounter (HOSPITAL_BASED_OUTPATIENT_CLINIC_OR_DEPARTMENT_OTHER): Payer: Self-pay | Admitting: Nurse Practitioner

## 2022-11-10 ENCOUNTER — Ambulatory Visit (INDEPENDENT_AMBULATORY_CARE_PROVIDER_SITE_OTHER): Payer: Medicare Other | Admitting: Nurse Practitioner

## 2022-11-10 VITALS — BP 118/67 | HR 59 | Temp 98.3°F | Wt 142.0 lb

## 2022-11-10 DIAGNOSIS — R7303 Prediabetes: Secondary | ICD-10-CM

## 2022-11-10 DIAGNOSIS — I10 Essential (primary) hypertension: Secondary | ICD-10-CM

## 2022-11-10 DIAGNOSIS — Z789 Other specified health status: Secondary | ICD-10-CM

## 2022-11-10 DIAGNOSIS — F339 Major depressive disorder, recurrent, unspecified: Secondary | ICD-10-CM

## 2022-11-10 DIAGNOSIS — E038 Other specified hypothyroidism: Secondary | ICD-10-CM | POA: Diagnosis not present

## 2022-11-10 DIAGNOSIS — E782 Mixed hyperlipidemia: Secondary | ICD-10-CM | POA: Diagnosis not present

## 2022-11-10 DIAGNOSIS — E063 Autoimmune thyroiditis: Secondary | ICD-10-CM

## 2022-11-10 DIAGNOSIS — E559 Vitamin D deficiency, unspecified: Secondary | ICD-10-CM | POA: Diagnosis not present

## 2022-11-10 DIAGNOSIS — B001 Herpesviral vesicular dermatitis: Secondary | ICD-10-CM

## 2022-11-10 DIAGNOSIS — F5101 Primary insomnia: Secondary | ICD-10-CM

## 2022-11-10 MED ORDER — VALACYCLOVIR HCL 500 MG PO TABS
500.0000 mg | ORAL_TABLET | Freq: Two times a day (BID) | ORAL | 3 refills | Status: DC
  Filled 2022-11-10: qty 180, 90d supply, fill #0
  Filled 2023-02-06: qty 180, 90d supply, fill #1
  Filled 2023-05-06 (×2): qty 180, 90d supply, fill #2

## 2022-11-10 MED ORDER — NALTREXONE HCL (PAIN) 4.5 MG PO CAPS: ORAL_CAPSULE | ORAL | 0 refills | Status: AC

## 2022-11-10 NOTE — Patient Instructions (Addendum)
I will miss seeing you! My thoughts and prayers are with you and your family as you go through this incredibly difficult loss. I am so sorry.

## 2022-11-10 NOTE — Progress Notes (Signed)
Worthy Keeler, DNP, AGNP-c [address], [place] [address] [phone] Office [phone] Fax  ESTABLISHED PATIENT- Chronic Health and/or Follow-Up Visit  Blood pressure 118/67, pulse (!) 59, temperature 98.3 F (36.8 C), weight 142 lb (64.4 kg), SpO2 98 %.    Wendy Joyce is a 76 y.o. year old female presenting today for evaluation and management of the following: Follow-up  Jamoni tells me that she has had a rough few weeks. Her grand daughter passed away unexpectedly in 10-21-2023 and she has been trying to help her daughter through this difficult time.   She tells me that she will be having surgery on her left thumb in possibly January and they will be replacing the joint in her left thumb.   She tells me she is still working on removing the toxins from her body. She had testing done earlier this year and this showed that she had several heavy metal toxins in her system. She has been taking the supplements to rid her body of these. She tells me she will be retested in a few months to see if this has cleared. She tells me it is hard to tell if this is effective for her IBS because her stress level has been so high with the passing of her granddauther.   She recently completed 6 weeks of Osterdock therapy and she feels that this was helpful. She does not have any additional therapy scheduled.   She is going to have an axonix implant to help control her bladder. That will be done in January.   She would like a refill on her valtrex for fever blister.   All ROS negative with exception of what is listed above.   PHYSICAL EXAM Physical Exam Vitals and nursing note reviewed.  Cardiovascular:     Rate and Rhythm: Normal rate and regular rhythm.     Pulses: Normal pulses.     Heart sounds: Normal heart sounds.  Pulmonary:     Effort: Pulmonary effort is normal.     Breath sounds: Normal breath sounds.  Abdominal:      General: Abdomen is flat. Bowel sounds are normal.  Musculoskeletal:     Cervical back: Normal range of motion and neck supple.  Skin:    General: Skin is warm and dry.     Capillary Refill: Capillary refill takes less than 2 seconds.  Neurological:     General: No focal deficit present.     Mental Status: She is alert and oriented to person, place, and time.  Psychiatric:        Attention and Perception: Attention normal.        Mood and Affect: Mood is depressed. Affect is flat and tearful.        Speech: Speech normal.        Behavior: Behavior normal. Behavior is cooperative.        Thought Content: Thought content normal.        Cognition and Memory: Cognition normal.        Judgment: Judgment normal.     PLAN Problem List Items Addressed This Visit     Hypothyroidism    Chronic. Currently managed with levothyroxine. We will monitor labs today and make changes to the dosing as necessary based on lab results. Recommend notify the office if she has any new symptoms associated with thyroid disorder.       Relevant Orders   CBC with Differential/Platelet (  Completed)   Comprehensive metabolic panel (Completed)   TSH (Completed)   T4, free (Completed)   B12 and Folate Panel (Completed)   Hemoglobin A1c (Completed)   VITAMIN D 25 Hydroxy (Vit-D Deficiency, Fractures) (Completed)   Magnesium (Completed)   Hyperlipidemia    Chronic. Statin intolerant. We will monitor lipids today.       Relevant Orders   CBC with Differential/Platelet (Completed)   Comprehensive metabolic panel (Completed)   TSH (Completed)   T4, free (Completed)   B12 and Folate Panel (Completed)   Hemoglobin A1c (Completed)   VITAMIN D 25 Hydroxy (Vit-D Deficiency, Fractures) (Completed)   Magnesium (Completed)   Essential hypertension    Chronic. Well controlled at this time with no alarm symptoms. No changes in therapy at this time. Labs pending. She will plan to transition her care to Dr.  Burnard Bunting when I leave. I have recommended that she follow-up with him in 6 months or sooner if there is need.       Relevant Orders   CBC with Differential/Platelet (Completed)   Comprehensive metabolic panel (Completed)   TSH (Completed)   T4, free (Completed)   B12 and Folate Panel (Completed)   Hemoglobin A1c (Completed)   VITAMIN D 25 Hydroxy (Vit-D Deficiency, Fractures) (Completed)   Magnesium (Completed)   Fever blister    Chronic intermittent infection. Refill provided today.       Relevant Medications   valACYclovir (VALTREX) 500 MG tablet   Insomnia    Chronic. Likely related to mental health. I am hopeful this will improve with treatment and medication as well as emotional healing from her recent loss. Recommend that she use progressive muscle relaxation and therapeutic techniques to help with management. I recommend that psychiatry manage this if medication is required as I feel they would be best equipped for this.      Relevant Orders   CBC with Differential/Platelet (Completed)   Comprehensive metabolic panel (Completed)   TSH (Completed)   T4, free (Completed)   B12 and Folate Panel (Completed)   Hemoglobin A1c (Completed)   VITAMIN D 25 Hydroxy (Vit-D Deficiency, Fractures) (Completed)   Magnesium (Completed)   Vitamin D deficiency    Labs pending today.       Relevant Orders   CBC with Differential/Platelet (Completed)   Comprehensive metabolic panel (Completed)   TSH (Completed)   T4, free (Completed)   B12 and Folate Panel (Completed)   Hemoglobin A1c (Completed)   VITAMIN D 25 Hydroxy (Vit-D Deficiency, Fractures) (Completed)   Magnesium (Completed)   Pre-diabetes    Chronic. We will monitor labs today. No change in therapy at this time.       Relevant Orders   CBC with Differential/Platelet (Completed)   Comprehensive metabolic panel (Completed)   TSH (Completed)   T4, free (Completed)   B12 and Folate Panel (Completed)   Hemoglobin A1c  (Completed)   VITAMIN D 25 Hydroxy (Vit-D Deficiency, Fractures) (Completed)   Magnesium (Completed)   Depression, recurrent ([place]) - Primary    Chronic. She has recently suffered a significant and tragic loss with the passing of her granddaughter, this has understandably been very difficult and emotionally straining. Aside from this she does appear to be doing better with her mental health. She has recently been started on Naltrexone for management. I recommend that she continue close monitoring with psychiatry and she reach out if she feels that her mood is not improving or if she has any thoughts of self harm.  At this time there are no alarm symptoms present.       Relevant Medications   Naltrexone HCl, Pain, 4.5 MG CAPS   Other Relevant Orders   TSH (Completed)   T4, free (Completed)   Statin not tolerated   Relevant Orders   CBC with Differential/Platelet (Completed)   Comprehensive metabolic panel (Completed)   TSH (Completed)   T4, free (Completed)   B12 and Folate Panel (Completed)   Hemoglobin A1c (Completed)   VITAMIN D 25 Hydroxy (Vit-D Deficiency, Fractures) (Completed)   Magnesium (Completed)    Return in about 6 months (around 05/11/2023) for Chronic Condition F/U.   Worthy Keeler, DNP, AGNP-c 11/10/2022  2:47 PM

## 2022-11-11 ENCOUNTER — Other Ambulatory Visit (HOSPITAL_BASED_OUTPATIENT_CLINIC_OR_DEPARTMENT_OTHER): Payer: Self-pay

## 2022-11-11 DIAGNOSIS — M1812 Unilateral primary osteoarthritis of first carpometacarpal joint, left hand: Secondary | ICD-10-CM | POA: Diagnosis not present

## 2022-11-11 DIAGNOSIS — M79642 Pain in left hand: Secondary | ICD-10-CM | POA: Diagnosis not present

## 2022-11-11 LAB — COMPREHENSIVE METABOLIC PANEL
ALT: 18 IU/L (ref 0–32)
AST: 16 IU/L (ref 0–40)
Albumin/Globulin Ratio: 1.7 (ref 1.2–2.2)
Albumin: 4.4 g/dL (ref 3.8–4.8)
Alkaline Phosphatase: 50 IU/L (ref 44–121)
BUN/Creatinine Ratio: 31 — ABNORMAL HIGH (ref 12–28)
BUN: 22 mg/dL (ref 8–27)
Bilirubin Total: 0.2 mg/dL (ref 0.0–1.2)
CO2: 22 mmol/L (ref 20–29)
Calcium: 10.2 mg/dL (ref 8.7–10.3)
Chloride: 103 mmol/L (ref 96–106)
Creatinine, Ser: 0.71 mg/dL (ref 0.57–1.00)
Globulin, Total: 2.6 g/dL (ref 1.5–4.5)
Glucose: 98 mg/dL (ref 70–99)
Potassium: 4.7 mmol/L (ref 3.5–5.2)
Sodium: 139 mmol/L (ref 134–144)
Total Protein: 7 g/dL (ref 6.0–8.5)
eGFR: 90 mL/min/{1.73_m2} (ref >59)

## 2022-11-11 LAB — CBC WITH DIFFERENTIAL/PLATELET
Basophils Absolute: 0.1 10*3/uL (ref 0.0–0.2)
Basos: 1 %
EOS (ABSOLUTE): 0.3 10*3/uL (ref 0.0–0.4)
Eos: 3 %
Hematocrit: 42.5 % (ref 34.0–46.6)
Hemoglobin: 14.1 g/dL (ref 11.1–15.9)
Immature Grans (Abs): 0 10*3/uL (ref 0.0–0.1)
Immature Granulocytes: 0 %
Lymphocytes Absolute: 2.3 10*3/uL (ref 0.7–3.1)
Lymphs: 30 %
MCH: 30.9 pg (ref 26.6–33.0)
MCHC: 33.2 g/dL (ref 31.5–35.7)
MCV: 93 fL (ref 79–97)
Monocytes Absolute: 0.6 10*3/uL (ref 0.1–0.9)
Monocytes: 7 %
Neutrophils Absolute: 4.4 10*3/uL (ref 1.4–7.0)
Neutrophils: 59 %
Platelets: 227 10*3/uL (ref 150–450)
RBC: 4.56 x10E6/uL (ref 3.77–5.28)
RDW: 14.5 % (ref 11.7–15.4)
WBC: 7.6 10*3/uL (ref 3.4–10.8)

## 2022-11-11 LAB — HEMOGLOBIN A1C
Est. average glucose Bld gHb Est-mCnc: 114 mg/dL
Hgb A1c MFr Bld: 5.6 % (ref 4.8–5.6)

## 2022-11-11 LAB — VITAMIN D 25 HYDROXY (VIT D DEFICIENCY, FRACTURES): Vit D, 25-Hydroxy: 73.6 ng/mL (ref 30.0–100.0)

## 2022-11-11 LAB — B12 AND FOLATE PANEL
Folate: >20 ng/mL (ref >3.0)
Vitamin B-12: >2000 pg/mL — ABNORMAL HIGH (ref 232–1245)

## 2022-11-11 LAB — TSH: TSH: 0.61 u[IU]/mL (ref 0.450–4.500)

## 2022-11-11 LAB — T4, FREE: Free T4: 1.47 ng/dL (ref 0.82–1.77)

## 2022-11-11 LAB — MAGNESIUM: Magnesium: 2.1 mg/dL (ref 1.6–2.3)

## 2022-11-15 ENCOUNTER — Telehealth (HOSPITAL_BASED_OUTPATIENT_CLINIC_OR_DEPARTMENT_OTHER): Payer: Self-pay | Admitting: Family Medicine

## 2022-11-15 NOTE — Telephone Encounter (Signed)
Surgery Clearance from [facility]  Dr Tempie Donning  Copied, and placed on providers box

## 2022-11-16 ENCOUNTER — Other Ambulatory Visit (HOSPITAL_BASED_OUTPATIENT_CLINIC_OR_DEPARTMENT_OTHER): Payer: Self-pay

## 2022-11-22 DIAGNOSIS — F4312 Post-traumatic stress disorder, chronic: Secondary | ICD-10-CM | POA: Diagnosis not present

## 2022-11-22 DIAGNOSIS — F330 Major depressive disorder, recurrent, mild: Secondary | ICD-10-CM | POA: Diagnosis not present

## 2022-11-23 ENCOUNTER — Ambulatory Visit (INDEPENDENT_AMBULATORY_CARE_PROVIDER_SITE_OTHER): Payer: Medicare Other | Admitting: Family Medicine

## 2022-11-23 VITALS — BP 130/56 | HR 49 | Ht 63.0 in | Wt 142.7 lb

## 2022-11-23 DIAGNOSIS — Z01818 Encounter for other preprocedural examination: Secondary | ICD-10-CM

## 2022-11-23 NOTE — Assessment & Plan Note (Signed)
Based on history and exam will order the following preoperative tests: None.  Patient did have recent CBC and CMP less than 2 weeks ago and these were generally reassuring with no anemia noted, normal white blood cell and red blood cell count at that time.  No concerns today regarding acute cardiopulmonary issues, thus do not feel that chest x-ray or EKG will be needed at this time.  Patient is medically cleared to proceed with planned surgery.

## 2022-11-23 NOTE — Progress Notes (Signed)
    Procedures performed today:    None.  Independent interpretation of notes and tests performed by another provider:   None.  Brief History, Exam, Impression, and Recommendations:    Wendy Joyce is a 76 y.o. presenting for preoperative evaluation/clearance. The planned procedure is left thumb trapeziectomy with suspensionplasty with the treatment goal of improved pain and function. Cardiac risk for planned procedure is Intermediate (1 to 5%) - intraperitoneal or intrathoracic surgery, carotid endarterectomy, head and neck surgery, orthopedic surgery, prostate surgery  Signs or symptoms of cardiovascular disease? No New or unstable cardiopulmonary signs or symptoms? No Urinary symptoms or undergoing invasive urologic procedure? No  BP (!) 130/56 (BP Location: Left Arm, Patient Position: Sitting, Cuff Size: Normal)   Pulse (!) 49   Ht '5\' 3"'$  (1.6 m)   Wt 142 lb 11.2 oz (64.7 kg)   SpO2 100%   BMI 25.28 kg/m   Physical Exam: 76 year old female in no acute distress Cardiovascular exam with regular rate and rhythm, no murmur appreciated Lungs clear to auscultation bilaterally  Preoperative clearance Based on history and exam will order the following preoperative tests: None.  Patient did have recent CBC and CMP less than 2 weeks ago and these were generally reassuring with no anemia noted, normal white blood cell and red blood cell count at that time.  No concerns today regarding acute cardiopulmonary issues, thus do not feel that chest x-ray or EKG will be needed at this time.  Patient is medically cleared to proceed with planned surgery.   ___________________________________________ Wendy Vialpando de [place], MD, ABFM, CAQSM Primary Care and [place]

## 2022-11-23 NOTE — Patient Instructions (Signed)
  Medication Instructions:  Your physician recommends that you continue on your current medications as directed. Please refer to the Current Medication list given to you today. --If you need a refill on any your medications before your next appointment, please call your pharmacy first. If no refills are authorized on file call the office.-- Lab Work: Your physician has recommended that you have lab work today: No If you have labs (blood work) drawn today and your tests are completely normal, you will receive your results via [place] a phone call from our staff.  Please ensure you check your voicemail in the event that you authorized detailed messages to be left on a delegated number. If you have any lab test that is abnormal or we need to change your treatment, we will call you to review the results.  Referrals/Procedures/Imaging: NO  Follow-Up: Your next appointment:   Your physician recommends that you schedule a follow-up appointment in: as needed with Dr. de [place].  You will receive a text message or e-mail with a link to a survey about your care and experience with [place] today! We would greatly appreciate your feedback!   Thanks for letting us be apart of your health journey!!  Primary Care and Sports Medicine   Dr. Arlina Robes [place]   We encourage you to activate your patient portal called "MyChart".  Sign up information is provided on this After Visit Summary.  MyChart is used to connect with patients for Virtual Visits (Telemedicine).  Patients are able to view lab/test results, encounter notes, upcoming appointments, etc.  Non-urgent messages can be sent to your provider as well. To learn more about what you can do with MyChart, please visit --  [email or web address].

## 2022-11-24 ENCOUNTER — Telehealth (HOSPITAL_BASED_OUTPATIENT_CLINIC_OR_DEPARTMENT_OTHER): Payer: Self-pay | Admitting: Family Medicine

## 2022-11-24 ENCOUNTER — Other Ambulatory Visit (HOSPITAL_BASED_OUTPATIENT_CLINIC_OR_DEPARTMENT_OTHER): Payer: Self-pay

## 2022-11-28 ENCOUNTER — Other Ambulatory Visit (HOSPITAL_BASED_OUTPATIENT_CLINIC_OR_DEPARTMENT_OTHER): Payer: Self-pay | Admitting: Family Medicine

## 2022-11-28 ENCOUNTER — Other Ambulatory Visit (HOSPITAL_BASED_OUTPATIENT_CLINIC_OR_DEPARTMENT_OTHER): Payer: Self-pay

## 2022-11-28 NOTE — Telephone Encounter (Signed)
Pt came in states that she only sees Dr Toy Care 2 times a year, and asked Clarise Cruz Early to take over the prescription, and she advised that she would.

## 2022-11-29 ENCOUNTER — Other Ambulatory Visit (HOSPITAL_BASED_OUTPATIENT_CLINIC_OR_DEPARTMENT_OTHER): Payer: Self-pay

## 2022-11-29 ENCOUNTER — Ambulatory Visit (HOSPITAL_BASED_OUTPATIENT_CLINIC_OR_DEPARTMENT_OTHER): Payer: Medicare Other | Admitting: Family Medicine

## 2022-11-29 ENCOUNTER — Telehealth (HOSPITAL_BASED_OUTPATIENT_CLINIC_OR_DEPARTMENT_OTHER): Payer: Self-pay

## 2022-11-29 NOTE — Telephone Encounter (Signed)
Pt was called back in regards to medication she was trying to get refilled. Dr. De [place] responded to me with notes to go over to tell her.

## 2022-11-30 DIAGNOSIS — F332 Major depressive disorder, recurrent severe without psychotic features: Secondary | ICD-10-CM | POA: Diagnosis not present

## 2022-12-05 ENCOUNTER — Ambulatory Visit (HOSPITAL_BASED_OUTPATIENT_CLINIC_OR_DEPARTMENT_OTHER): Payer: Medicare Other | Admitting: Nurse Practitioner

## 2022-12-06 DIAGNOSIS — F4312 Post-traumatic stress disorder, chronic: Secondary | ICD-10-CM | POA: Diagnosis not present

## 2022-12-06 DIAGNOSIS — F330 Major depressive disorder, recurrent, mild: Secondary | ICD-10-CM | POA: Diagnosis not present

## 2022-12-07 ENCOUNTER — Other Ambulatory Visit (HOSPITAL_BASED_OUTPATIENT_CLINIC_OR_DEPARTMENT_OTHER): Payer: Self-pay

## 2022-12-07 ENCOUNTER — Other Ambulatory Visit: Payer: Self-pay

## 2022-12-08 ENCOUNTER — Other Ambulatory Visit (HOSPITAL_BASED_OUTPATIENT_CLINIC_OR_DEPARTMENT_OTHER): Payer: Self-pay

## 2022-12-09 ENCOUNTER — Other Ambulatory Visit: Payer: Self-pay

## 2022-12-09 ENCOUNTER — Other Ambulatory Visit (HOSPITAL_BASED_OUTPATIENT_CLINIC_OR_DEPARTMENT_OTHER): Payer: Self-pay

## 2022-12-12 ENCOUNTER — Other Ambulatory Visit (HOSPITAL_BASED_OUTPATIENT_CLINIC_OR_DEPARTMENT_OTHER): Payer: Self-pay

## 2022-12-20 ENCOUNTER — Other Ambulatory Visit: Payer: Self-pay

## 2022-12-21 ENCOUNTER — Other Ambulatory Visit (HOSPITAL_BASED_OUTPATIENT_CLINIC_OR_DEPARTMENT_OTHER): Payer: Self-pay

## 2022-12-21 MED ORDER — ALPRAZOLAM 0.25 MG PO TABS
0.2500 mg | ORAL_TABLET | Freq: Every day | ORAL | 2 refills | Status: DC | PRN
  Filled 2022-12-21: qty 30, 15d supply, fill #0
  Filled 2023-05-22: qty 30, 15d supply, fill #1

## 2022-12-23 ENCOUNTER — Other Ambulatory Visit (HOSPITAL_BASED_OUTPATIENT_CLINIC_OR_DEPARTMENT_OTHER): Payer: Self-pay

## 2022-12-25 DIAGNOSIS — F339 Major depressive disorder, recurrent, unspecified: Secondary | ICD-10-CM | POA: Insufficient documentation

## 2022-12-25 NOTE — Assessment & Plan Note (Signed)
Chronic. Currently managed with levothyroxine. We will monitor labs today and make changes to the dosing as necessary based on lab results. Recommend notify the office if she has any new symptoms associated with thyroid disorder.

## 2022-12-25 NOTE — Assessment & Plan Note (Signed)
Chronic. Likely related to mental health. I am hopeful this will improve with treatment and medication as well as emotional healing from her recent loss. Recommend that she use progressive muscle relaxation and therapeutic techniques to help with management. I recommend that psychiatry manage this if medication is required as I feel they would be best equipped for this.

## 2022-12-25 NOTE — Assessment & Plan Note (Signed)
Chronic. Well controlled at this time with no alarm symptoms. No changes in therapy at this time. Labs pending. She will plan to transition her care to Dr. Burnard Bunting when I leave. I have recommended that she follow-up with him in 6 months or sooner if there is need.

## 2022-12-25 NOTE — Assessment & Plan Note (Signed)
Chronic. Statin intolerant. We will monitor lipids today.

## 2022-12-25 NOTE — Assessment & Plan Note (Signed)
Labs pending today.  

## 2022-12-25 NOTE — Assessment & Plan Note (Addendum)
Chronic. She has recently suffered a significant and tragic loss with the passing of her granddaughter, this has understandably been very difficult and emotionally straining. Aside from this she does appear to be doing better with her mental health. She has recently been started on Naltrexone for management. I recommend that she continue close monitoring with psychiatry and she reach out if she feels that her mood is not improving or if she has any thoughts of self harm. At this time there are no alarm symptoms present.

## 2022-12-25 NOTE — Assessment & Plan Note (Signed)
Chronic intermittent infection. Refill provided today.

## 2022-12-25 NOTE — Assessment & Plan Note (Signed)
Chronic. We will monitor labs today. No change in therapy at this time.

## 2022-12-28 ENCOUNTER — Other Ambulatory Visit (HOSPITAL_COMMUNITY): Payer: Self-pay

## 2022-12-28 ENCOUNTER — Other Ambulatory Visit (HOSPITAL_BASED_OUTPATIENT_CLINIC_OR_DEPARTMENT_OTHER): Payer: Self-pay | Admitting: Family Medicine

## 2022-12-28 ENCOUNTER — Other Ambulatory Visit (HOSPITAL_BASED_OUTPATIENT_CLINIC_OR_DEPARTMENT_OTHER): Payer: Self-pay

## 2022-12-28 HISTORY — PX: OTHER SURGICAL HISTORY: SHX169

## 2022-12-29 ENCOUNTER — Other Ambulatory Visit (HOSPITAL_BASED_OUTPATIENT_CLINIC_OR_DEPARTMENT_OTHER): Payer: Self-pay

## 2022-12-29 DIAGNOSIS — M1812 Unilateral primary osteoarthritis of first carpometacarpal joint, left hand: Secondary | ICD-10-CM | POA: Diagnosis not present

## 2022-12-29 MED ORDER — ESTRADIOL 0.5 MG PO TABS
0.5000 mg | ORAL_TABLET | Freq: Every day | ORAL | 3 refills | Status: DC
  Filled 2022-12-29: qty 90, 90d supply, fill #0
  Filled 2023-03-22: qty 90, 90d supply, fill #1
  Filled 2023-06-20: qty 90, 90d supply, fill #2
  Filled 2023-09-18: qty 90, 90d supply, fill #3

## 2022-12-29 MED ORDER — OXYCODONE HCL 5 MG PO TABS
5.0000 mg | ORAL_TABLET | ORAL | 0 refills | Status: DC
  Filled 2022-12-29: qty 42, 7d supply, fill #0

## 2022-12-31 ENCOUNTER — Other Ambulatory Visit (HOSPITAL_BASED_OUTPATIENT_CLINIC_OR_DEPARTMENT_OTHER): Payer: Self-pay

## 2023-01-03 ENCOUNTER — Other Ambulatory Visit (HOSPITAL_BASED_OUTPATIENT_CLINIC_OR_DEPARTMENT_OTHER): Payer: Self-pay

## 2023-01-03 MED ORDER — PROGESTERONE MICRONIZED 100 MG PO CAPS
100.0000 mg | ORAL_CAPSULE | Freq: Every day | ORAL | 1 refills | Status: DC
  Filled 2023-01-03: qty 90, 45d supply, fill #0
  Filled 2023-02-14: qty 90, 45d supply, fill #1

## 2023-01-06 DIAGNOSIS — N3281 Overactive bladder: Secondary | ICD-10-CM | POA: Diagnosis not present

## 2023-01-06 DIAGNOSIS — N3941 Urge incontinence: Secondary | ICD-10-CM | POA: Diagnosis not present

## 2023-01-09 ENCOUNTER — Other Ambulatory Visit (HOSPITAL_BASED_OUTPATIENT_CLINIC_OR_DEPARTMENT_OTHER): Payer: Self-pay

## 2023-01-10 ENCOUNTER — Other Ambulatory Visit (HOSPITAL_BASED_OUTPATIENT_CLINIC_OR_DEPARTMENT_OTHER): Payer: Self-pay

## 2023-01-10 DIAGNOSIS — M79642 Pain in left hand: Secondary | ICD-10-CM | POA: Diagnosis not present

## 2023-01-10 MED ORDER — PROGESTERONE MICRONIZED 100 MG PO CAPS
100.0000 mg | ORAL_CAPSULE | Freq: Every evening | ORAL | 1 refills | Status: DC
  Filled 2023-01-10 – 2023-01-12 (×3): qty 90, 90d supply, fill #0
  Filled 2023-04-12: qty 8, 8d supply, fill #0
  Filled 2023-04-12: qty 90, 45d supply, fill #0
  Filled 2023-04-12: qty 82, 82d supply, fill #0
  Filled 2023-05-22: qty 90, 90d supply, fill #1

## 2023-01-11 ENCOUNTER — Other Ambulatory Visit (HOSPITAL_BASED_OUTPATIENT_CLINIC_OR_DEPARTMENT_OTHER): Payer: Self-pay

## 2023-01-12 ENCOUNTER — Other Ambulatory Visit (HOSPITAL_BASED_OUTPATIENT_CLINIC_OR_DEPARTMENT_OTHER): Payer: Self-pay

## 2023-01-17 ENCOUNTER — Other Ambulatory Visit (HOSPITAL_BASED_OUTPATIENT_CLINIC_OR_DEPARTMENT_OTHER): Payer: Self-pay

## 2023-01-17 DIAGNOSIS — N3941 Urge incontinence: Secondary | ICD-10-CM | POA: Diagnosis not present

## 2023-01-17 DIAGNOSIS — N3281 Overactive bladder: Secondary | ICD-10-CM | POA: Diagnosis not present

## 2023-01-17 MED ORDER — SULFAMETHOXAZOLE-TRIMETHOPRIM 800-160 MG PO TABS
ORAL_TABLET | ORAL | 0 refills | Status: DC
  Filled 2023-01-17: qty 20, 10d supply, fill #0

## 2023-01-19 ENCOUNTER — Other Ambulatory Visit (HOSPITAL_BASED_OUTPATIENT_CLINIC_OR_DEPARTMENT_OTHER): Payer: Self-pay

## 2023-01-23 DIAGNOSIS — M25642 Stiffness of left hand, not elsewhere classified: Secondary | ICD-10-CM | POA: Diagnosis not present

## 2023-01-24 DIAGNOSIS — N3941 Urge incontinence: Secondary | ICD-10-CM | POA: Diagnosis not present

## 2023-01-24 DIAGNOSIS — N3281 Overactive bladder: Secondary | ICD-10-CM | POA: Diagnosis not present

## 2023-01-31 ENCOUNTER — Ambulatory Visit (HOSPITAL_BASED_OUTPATIENT_CLINIC_OR_DEPARTMENT_OTHER): Payer: Medicare Other

## 2023-01-31 DIAGNOSIS — R69 Illness, unspecified: Secondary | ICD-10-CM | POA: Diagnosis not present

## 2023-02-06 ENCOUNTER — Other Ambulatory Visit (HOSPITAL_BASED_OUTPATIENT_CLINIC_OR_DEPARTMENT_OTHER): Payer: Self-pay

## 2023-02-07 ENCOUNTER — Ambulatory Visit: Payer: Medicare Other | Attending: Orthopedic Surgery | Admitting: Occupational Therapy

## 2023-02-07 DIAGNOSIS — M25542 Pain in joints of left hand: Secondary | ICD-10-CM | POA: Diagnosis not present

## 2023-02-07 DIAGNOSIS — M6281 Muscle weakness (generalized): Secondary | ICD-10-CM | POA: Diagnosis not present

## 2023-02-07 NOTE — Therapy (Signed)
OUTPATIENT OCCUPATIONAL THERAPY ORTHO EVALUATION  Patient Name: Wendy Joyce MRN: [id number] DOB:1946/08/07, 77 y.o., female Today's Date: 02/07/2023  PCP: [place], Blondell Reveal, MD REFERRING PROVIDER: Sherilyn Cooter, MD  END OF SESSION:  OT End of Session - 02/07/23 1544     Visit Number 1    Number of Visits 15    Date for OT Re-Evaluation 05/05/23    Authorization Type BCBS [place]    OT Start Time 1448    OT Stop Time [id number]    OT Time Calculation (min) 44 min             Past Medical History:  Diagnosis Date   Abdominal pain, generalized 01/29/2015   Abnormal thyroid function test 02/25/2016   ADHD (attention deficit hyperactivity disorder)    Allergic rhinitis, unspecified 01/29/2015   Ankle sprain 03/16/2016   Anxiety 01/29/2015   Arthritis    hands   Chronic kidney disease 2012   kidney stones   Constipation 01/29/2015   Decreased libido 05/02/2018   Disorder of the skin and subcutaneous tissue, unspecified 01/29/2015   DJD (degenerative joint disease)    hand   Dysthymia    Endometriosis    External otitis    Family history of coronary artery disease 05/02/2018   Fatigue due to excessive exertion 02/24/2016   Fever blister    Fibromyalgia 01/29/2015   Frequency of micturition 01/29/2015   Functional dyspepsia 01/29/2015   Gastro-esophageal reflux disease without esophagitis 01/29/2015   Headache(784.0)    High cholesterol    Hyperlipidemia 09/12/2016   Hypersomnia    Hypothyroidism    IBS (irritable bowel syndrome) 05/02/2018   Insomnia    Internal hemorrhoids    Irritable 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 01/29/2015   Labial pain 11/30/2016   Lactose intolerance 12/14/2016   Lactose intolerance in adult 09/12/2016   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   Left ureteral stone    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   Major depressive disorder, single episode, unspecified    Migraine with aura, not intractable, without status migrainosus     Mixed hyperlipidemia    Mouth pain 01/07/2016   Osteoarthritis of hip    Osteopenia determined by x-ray 03/02/2017   Personal history of estrogen therapy    Phlebitis and thrombophlebitis of the leg    Pityriasis alba 01/29/2015   Pure hypercholesterolemia 06/29/2016   Rectum pain 09/12/2016   Recurrent sinus infections 02/24/2016   Rhinosinusitis 01/07/2016   Rosacea    Sinusitis 12/24/2015   Tinnitus of left ear 123XX123   Umbilical hernia without obstruction and without gangrene 123XX123   Umbilical pain [id number]   Unspecified urinary incontinence 01/07/2016   Urticaria    UTI (urinary tract infection)    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   Vitamin D deficiency    Past Surgical History:  Procedure Laterality Date   BREAST ENHANCEMENT SURGERY     BREAST REDUCTION SURGERY Bilateral 2018   COLONOSCOPY WITH PROPOFOL N/A 02/25/2013   Procedure: COLONOSCOPY WITH PROPOFOL;  Surgeon: Garlan Fair, MD;  Location: [facility];  Service: Endoscopy;  Laterality: N/A;   mini facelift  2000   NASAL SINUS SURGERY     REDUCTION MAMMAPLASTY     ROTATOR CUFF REPAIR     Right   ROTATOR CUFF REPAIR     Left   SEPTOPLASTY WITH ETHMOIDECTOMY, AND MAXILLARY ANTROSTOMY Bilateral 2006   TOTAL ABDOMINAL HYSTERECTOMY     ovaries remain  TUBAL LIGATION     umbicial hernia     Patient Active Problem List   Diagnosis Date Noted   Depression, recurrent ([place]) 12/25/2022   Preoperative clearance 11/23/2022   Encounter for counseling 10/14/2022   Influenza 09/18/2022   Pain of left hand 06/29/2022   Left wrist tendonitis 05/31/2022   Vasomotor symptoms due to menopause 05/30/2022   Pre-diabetes 05/30/2022   Anxiety disorder 03/17/2022   Coronary artery arteriosclerosis 03/17/2022   Attention deficit hyperactivity disorder 03/17/2022   Allergic rhinitis 03/17/2022   Constipation 03/17/2022   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   Edema  03/17/2022   Enthesopathy 03/17/2022   Essential hypertension 03/17/2022   Exertional dyspnea 03/17/2022   Family history of coronary artery disease 03/17/2022   Weakness with dizziness 03/17/2022   Fever blister 03/17/2022   Frequency of micturition 03/17/2022   Gastro-esophageal reflux disease without esophagitis 03/17/2022   Hypersomnia 03/17/2022   Insomnia 03/17/2022   Irritable bowel syndrome 03/17/2022   Lactose intolerance 03/17/2022   Left anterior fascicular block 123456   Lichen planus 123456   Low back pain 03/17/2022   Major depression 03/17/2022   Panniculitis 03/17/2022   Refractory migraine with aura 03/17/2022   Statin not tolerated 03/17/2022   Thrombophlebitis of superficial veins of lower extremity 03/17/2022   Bilateral tinnitus 123456   Umbilical hernia 123456   Urinary incontinence 03/17/2022   Urticaria due to drug allergy 03/17/2022   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 03/17/2022   Vitamin D deficiency 03/17/2022   Sexual dysfunction 03/17/2022   Imbalance 03/17/2022   Fibromyalgia 09/21/2021   Hyperlipidemia 09/21/2021   Hashimoto's thyroiditis 03/31/2021   Hypothyroidism 09/27/2013    ONSET DATE: 12/29/22  REFERRING DIAG: ***  THERAPY DIAG:  Pain in joint of left hand  Muscle weakness (generalized)  Rationale for Evaluation and Treatment: Rehabilitation  SUBJECTIVE:   SUBJECTIVE STATEMENT: *** Pt accompanied by: self and significant other  PERTINENT HISTORY: ***  PRECAUTIONS: Other: no lifting, wearing splint "most of the time" and remove for exercises  WEIGHT BEARING RESTRICTIONS: Yes no lifting  PAIN:  Are you having pain? No  FALLS: Has patient fallen in last 6 months? No  LIVING ENVIRONMENT: Lives with: lives with their spouse Lives in: House/apartment Stairs: Yes: Internal: art studios is up full flight of steps and External: 2 steps; none Has following equipment at home: None  PLOF:  Independent  PATIENT GOALS: to be able to use my hand as well as I could before  NEXT MD VISIT: 02/13/23  OBJECTIVE:   HAND DOMINANCE: Right  ADLs: Overall ADLs: Currently wearing gown and robe and/or legging style pants primarily due to difficulty with managing pants with button/zipper and not wanting help  FUNCTIONAL OUTCOME MEASURES: {OTFUNCTIONALMEASURES:27238}  UPPER EXTREMITY ROM:     Active ROM Right eval Left eval  Shoulder flexion    Shoulder abduction    Shoulder adduction    Shoulder extension    Shoulder internal rotation    Shoulder external rotation    Elbow flexion    Elbow extension    Wrist flexion  56  Wrist extension  48  Wrist ulnar deviation  28  Wrist radial deviation  10  Wrist pronation  87  Wrist supination  86  (Blank rows = not tested)  Active ROM Right eval Left eval  Thumb MCP (0-60)  30  Thumb IP (0-80)  55  Thumb Radial abd/add (0-55)     Thumb Palmar abd/add (0-45)  Thumb Opposition to Small Finger     Index MCP (0-90)     Index PIP (0-100)     Index DIP (0-70)      Long MCP (0-90)      Long PIP (0-100)      Long DIP (0-70)      Ring MCP (0-90)      Ring PIP (0-100)      Ring DIP (0-70)      Little MCP (0-90)      Little PIP (0-100)      Little DIP (0-70)      (Blank rows = not tested)  HAND FUNCTION: Not assessed due to restrictions s/p surgery   COORDINATION: Not assessed due to restrictions s/p surgery  SENSATION: Paresthesias with palpation at scar  EDEMA: swelling in [facility] joint ***  COGNITION: Overall cognitive status: Within functional limits for tasks assessed  OBSERVATIONS: ***   TODAY'S TREATMENT:                                                                                                                              DATE: ***  There ex.   PATIENT EDUCATION: Education details: *** Person educated: {Person educated:25204} Education method: {Education Method:25205} Education comprehension:  {Education Comprehension:25206}  HOME EXERCISE PROGRAM: Access Code: [email or web address] URL: [email or web address] Date: 02/07/2023 Prepared by: [facility]  Exercises - Seated Thumb IP Flexion AROM with Blocking  - 4-6 x daily - 1 sets - 10 reps - 3-5 sec hold - Wrist Extension AROM  - 4-6 x daily - 7 x weekly - 1 sets - 10 reps - 3-5 sec hold - Seated Wrist Radial and Ulnar Deviation AROM  - 4-6 x daily - 1 sets - 10 reps - 3-5 sec hold - Thumb AROM Opposition To All Fingers  - 4-6 x daily - 1 sets - 10 reps - Seated Thumb Composite Flexion AROM  - 4-6 x daily - 1 sets - 10 reps  GOALS: Goals reviewed with patient? {yes/no:20286}  SHORT TERM GOALS: Target date: ***  *** Baseline: Goal status: {GOALSTATUS:25110}  2.  *** Baseline:  Goal status: {GOALSTATUS:25110}  3.  *** Baseline:  Goal status: {GOALSTATUS:25110}  4.  *** Baseline:  Goal status: {GOALSTATUS:25110}  5.  *** Baseline:  Goal status: {GOALSTATUS:25110}  6.  *** Baseline:  Goal status: {GOALSTATUS:25110}  LONG TERM GOALS: Target date: ***  *** Baseline:  Goal status: {GOALSTATUS:25110}  2.  *** Baseline:  Goal status: {GOALSTATUS:25110}  3.  *** Baseline:  Goal status: {GOALSTATUS:25110}  4.  *** Baseline:  Goal status: {GOALSTATUS:25110}  5.  *** Baseline:  Goal status: {GOALSTATUS:25110}  6.  *** Baseline:  Goal status: {GOALSTATUS:25110}  ASSESSMENT:  CLINICAL IMPRESSION: Patient is a *** y.o. *** who was seen today for occupational therapy evaluation for ***.   PERFORMANCE DEFICITS: in functional skills including {OT physical skills:25468}, cognitive skills including {OT cognitive skills:25469},  and psychosocial skills including {OT psychosocial skills:25470}.   IMPAIRMENTS: are limiting patient from {OT performance deficits:25471}.   COMORBIDITIES: {Comorbidities:25485} that affects occupational performance. Patient will benefit  from skilled OT to address above impairments and improve overall function.  MODIFICATION OR ASSISTANCE TO COMPLETE EVALUATION: {OT modification:25474}  OT OCCUPATIONAL PROFILE AND HISTORY: {OT PROFILE AND HISTORY:25484}  CLINICAL DECISION MAKING: {OT CDM:25475}  REHAB POTENTIAL: {rehabpotential:25112}  EVALUATION COMPLEXITY: {Evaluation complexity:25115}      PLAN:  OT FREQUENCY: {rehab frequency:25116}  OT DURATION: {rehab duration:25117}  PLANNED INTERVENTIONS: {OT Interventions:25467}  RECOMMENDED OTHER SERVICES: ***  CONSULTED AND AGREED WITH PLAN OF CARE: [id number]  PLAN FOR NEXT SESSIONSimonne Come, OT 02/07/2023, 3:47 PM

## 2023-02-09 DIAGNOSIS — K080 Exfoliation of teeth due to systemic causes: Secondary | ICD-10-CM | POA: Diagnosis not present

## 2023-02-14 ENCOUNTER — Ambulatory Visit: Payer: Medicare Other | Admitting: Occupational Therapy

## 2023-02-14 DIAGNOSIS — M25542 Pain in joints of left hand: Secondary | ICD-10-CM

## 2023-02-14 DIAGNOSIS — M6281 Muscle weakness (generalized): Secondary | ICD-10-CM | POA: Diagnosis not present

## 2023-02-14 NOTE — Therapy (Signed)
OUTPATIENT OCCUPATIONAL THERAPY ORTHO  Treatment Note  Patient Name: Wendy Joyce MRN: [id number] DOB:1946-03-19, 77 y.o., female Today's Date: 02/07/2023  PCP: [place], Blondell Reveal, MD REFERRING PROVIDER: Sherilyn Cooter, MD  END OF SESSION:  OT End of Session - 02/14/23 0855     Visit Number 2    Number of Visits 12    Date for OT Re-Evaluation 03/24/23    Authorization Type BCBS [place]    OT Start Time 0850    OT Stop Time 0930    OT Time Calculation (min) 40 min              Past Medical History:  Diagnosis Date   Abdominal pain, generalized 01/29/2015   Abnormal thyroid function test 02/25/2016   ADHD (attention deficit hyperactivity disorder)    Allergic rhinitis, unspecified 01/29/2015   Ankle sprain 03/16/2016   Anxiety 01/29/2015   Arthritis    hands   Chronic kidney disease 2012   kidney stones   Constipation 01/29/2015   Decreased libido 05/02/2018   Disorder of the skin and subcutaneous tissue, unspecified 01/29/2015   DJD (degenerative joint disease)    hand   Dysthymia    Endometriosis    External otitis    Family history of coronary artery disease 05/02/2018   Fatigue due to excessive exertion 02/24/2016   Fever blister    Fibromyalgia 01/29/2015   Frequency of micturition 01/29/2015   Functional dyspepsia 01/29/2015   Gastro-esophageal reflux disease without esophagitis 01/29/2015   Headache(784.0)    High cholesterol    Hyperlipidemia 09/12/2016   Hypersomnia    Hypothyroidism    IBS (irritable bowel syndrome) 05/02/2018   Insomnia    Internal hemorrhoids    Irritable 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 01/29/2015   Labial pain 11/30/2016   Lactose intolerance 12/14/2016   Lactose intolerance in adult 09/12/2016   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   Left ureteral stone    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   Major depressive disorder, single episode, unspecified    Migraine with aura, not intractable, without status migrainosus     Mixed hyperlipidemia    Mouth pain 01/07/2016   Osteoarthritis of hip    Osteopenia determined by x-ray 03/02/2017   Personal history of estrogen therapy    Phlebitis and thrombophlebitis of the leg    Pityriasis alba 01/29/2015   Pure hypercholesterolemia 06/29/2016   Rectum pain 09/12/2016   Recurrent sinus infections 02/24/2016   Rhinosinusitis 01/07/2016   Rosacea    Sinusitis 12/24/2015   Tinnitus of left ear 123XX123   Umbilical hernia without obstruction and without gangrene 123XX123   Umbilical pain Q000111Q   Unspecified urinary incontinence 01/07/2016   Urticaria    UTI (urinary tract infection)    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   Vitamin D deficiency    Past Surgical History:  Procedure Laterality Date   BREAST ENHANCEMENT SURGERY     BREAST REDUCTION SURGERY Bilateral 2018   COLONOSCOPY WITH PROPOFOL N/A 02/25/2013   Procedure: COLONOSCOPY WITH PROPOFOL;  Surgeon: Garlan Fair, MD;  Location: [facility];  Service: Endoscopy;  Laterality: N/A;   mini facelift  2000   NASAL SINUS SURGERY     REDUCTION MAMMAPLASTY     ROTATOR CUFF REPAIR     Right   ROTATOR CUFF REPAIR     Left   SEPTOPLASTY WITH ETHMOIDECTOMY, AND MAXILLARY ANTROSTOMY Bilateral 2006   TOTAL ABDOMINAL HYSTERECTOMY     ovaries  remain   TUBAL LIGATION     umbicial hernia     Patient Active Problem List   Diagnosis Date Noted   Depression, recurrent ([place]) 12/25/2022   Preoperative clearance 11/23/2022   Encounter for counseling 10/14/2022   Influenza 09/18/2022   Pain of left hand 06/29/2022   Left wrist tendonitis 05/31/2022   Vasomotor symptoms due to menopause 05/30/2022   Pre-diabetes 05/30/2022   Anxiety disorder 03/17/2022   Coronary artery arteriosclerosis 03/17/2022   Attention deficit hyperactivity disorder 03/17/2022   Allergic rhinitis 03/17/2022   Constipation 03/17/2022   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   Edema  03/17/2022   Enthesopathy 03/17/2022   Essential hypertension 03/17/2022   Exertional dyspnea 03/17/2022   Family history of coronary artery disease 03/17/2022   Weakness with dizziness 03/17/2022   Fever blister 03/17/2022   Frequency of micturition 03/17/2022   Gastro-esophageal reflux disease without esophagitis 03/17/2022   Hypersomnia 03/17/2022   Insomnia 03/17/2022   Irritable bowel syndrome 03/17/2022   Lactose intolerance 03/17/2022   Left anterior fascicular block 123456   Lichen planus 123456   Low back pain 03/17/2022   Major depression 03/17/2022   Panniculitis 03/17/2022   Refractory migraine with aura 03/17/2022   Statin not tolerated 03/17/2022   Thrombophlebitis of superficial veins of lower extremity 03/17/2022   Bilateral tinnitus 123456   Umbilical hernia 123456   Urinary incontinence 03/17/2022   Urticaria due to drug allergy 03/17/2022   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  Pleased with compression sleeve addition to splint. Pt accompanied by: self and significant other (dropped her off)  PERTINENT HISTORY: HTN, hyperlipidemia, Vitamin D deficiency, Pre-diabetes  PRECAUTIONS: Other: no lifting, wearing splint "most of the time" and remove for exercises  WEIGHT BEARING RESTRICTIONS: Yes no lifting  PAIN:  Are you having  pain? Yes: NPRS scale: 2/10 Pain location: radial side of forearm below thumb Pain description: sharp Aggravating factors: unsure Relieving factors: massage  FALLS: Has patient fallen in last 6 months? No  LIVING ENVIRONMENT: Lives with: lives with their spouse Lives in: House/apartment Stairs: Yes: Internal: art studios is up full flight of steps and External: 2 steps; none Has following equipment at home: None  PLOF: Independent  PATIENT GOALS: to be able to use my hand as well as I could before  NEXT MD VISIT: 3/11//24  OBJECTIVE:   HAND DOMINANCE: Right  ADLs: Overall ADLs: Currently wearing gown and robe and/or legging style pants primarily due to difficulty with managing pants with button/zipper and not wanting help  FUNCTIONAL OUTCOME MEASURES: Quick Dash: 56.8% disability/symptom score  UPPER EXTREMITY ROM:     Active ROM Right eval Left eval  Shoulder flexion    Shoulder abduction    Shoulder adduction    Shoulder extension    Shoulder internal rotation    Shoulder external rotation    Elbow flexion    Elbow extension    Wrist flexion  56  Wrist extension  48  Wrist ulnar deviation  28  Wrist radial deviation  10  Wrist pronation  87  Wrist supination  86  (Blank rows = not tested)  Active ROM Right eval Left eval  Thumb MCP (0-60)  30  Thumb IP (0-80)  55  Thumb Radial abd/add (0-55)     Thumb Palmar abd/add (0-45)     Thumb Opposition to Small Finger     Index MCP (0-90)     Index PIP (0-100)     Index DIP (0-70)      Long MCP (0-90)      Long PIP (0-100)      Long DIP (0-70)      Ring MCP (0-90)      Ring PIP (0-100)      Ring DIP (0-70)      Little MCP (0-90)      Little PIP (0-100)      Little DIP (0-70)      (Blank rows = not tested)  HAND FUNCTION: Not assessed due to restrictions s/p surgery   COORDINATION: Not assessed due to restrictions s/p surgery  SENSATION: Paresthesias with palpation at scar  EDEMA: swelling in  [facility] joint, however per pt has decreased  COGNITION: Overall cognitive status: Within functional limits for tasks assessed    TODAY'S TREATMENT:                                                                                 02/14/23 Therapeutic exercise: thumb opposition - pain in space between MP and CMC when opposing to small finger.  Composite flexion at little finger - pain in space between MP and CMC.  Pt able to complete x5 each with min increase in pain.  OT placed pt hand on towel during radial/ulnar deviation to decrease strain at wrist, noted much improved technique.  OT added PROM thumb MP flexion with min cues for technique and hand placement.  Pt instructed in AROM and PROM and encouraged to complete PROM within tolerance, return to AROM if needed. Splint check: pt with mild pressure at [facility] joint due to persistent swelling.  Foam padding in place, to decrease rubbing/irritation.  Discussed application of additional padding or coban wrapping to thumb as needed, but not wanting to over-pad thumb area. Massage: OT completed retrograde massage with lotion along incision and throughout area of swelling in circular motion and linear fashion while educating on hand placement during exercises, massage, and at rest for continued edema management.     02/07/23 Therapeutic exercise: Educated pt in thumb opposition, opposition with composite flexion at little finger, and thumb IP flexion AROM with blocking.  Pt completed each x5 with good carryover of instruction.  OT reviewed wrist flexion/extension and radial/ulnar deviation from previous therapist.   Splint wear: reiterated importance of wearing splint at all times, only removing for exercise or hygiene/bathing; as pt stating that she would occasionally remove at night due to pain when sleeping. Educated on desensitization along scar and retrograde massage for edema management.   PATIENT EDUCATION: Education details: Educated on role and  purpose of OT as well as potential interventions and goals for therapy based on initial evaluation findings.  AROM with/without blocking exercises Person educated: Patient Education method: Explanation, Demonstration, Verbal cues, and Handouts Education comprehension: verbalized understanding  HOME EXERCISE PROGRAM: Access Code: [email or web address] URL: [email or web address] Date: 02/07/2023 Prepared by: [facility]  Exercises - Seated Thumb IP Flexion AROM with Blocking  - 4-6 x daily - 1 sets - 10 reps - 3-5 sec hold - Wrist Extension AROM  - 4-6 x daily - 7 x weekly - 1 sets - 10 reps - 3-5 sec hold - Seated Wrist Radial and Ulnar Deviation AROM  - 4-6 x daily - 1 sets - 10 reps - 3-5 sec hold - Thumb AROM Opposition To All Fingers  - 4-6 x daily - 1 sets - 10 reps - Seated Thumb Composite Flexion AROM  - 4-6 x daily - 1 sets - 10 reps  GOALS: Goals reviewed with patient? Yes  SHORT TERM GOALS: Target date: 03/03/23  Pt will report decreased pain level by 50%. Baseline: Goal status: IN PROGRESS  2.  Pt will improve composite thumb flexion by 20-30 degrees. Baseline: MCP 30, IP 55 Goal status: IN PROGRESS  3.  Pt will improve active wrist flexion by 15-20 degrees, and extension by 10-15 degrees. Baseline: flexion 56, extension 48 Goal status: IN PROGRESS  4.  Patient will be able to utilize Left hand as normal assist during ADLs. Baseline:  Goal status: IN PROGRESS   LONG TERM GOALS: Target date: 03/24/23  Pt will regain sufficient hand/wrist strength for home maintenance tasks, and leisure activities. Baseline:  Goal status: IN PROGRESS  2.  Patient will gradually return to her normal activity level without pain. Baseline:  Goal status: IN PROGRESS  3.  Pt will report improved functional use of LUE as evidenced by improved score on Quick DASH to <35% impairment. Baseline: 56.8% Goal status: IN  PROGRESS  ASSESSMENT:  CLINICAL IMPRESSION: Patient is 6 weeks and 4 days from Left thumb trapeziectomy with internal brace suspensioplasty and partial trapezoidectomy. Pt continues to c/o pain around [facility] joint with thumb opposition to small finger and with composite flexion at little finger.  Pt tolerated massage and desensitization this session with initial discomfort, however fading with continued massage.  PERFORMANCE DEFICITS: in functional skills including ADLs, IADLs, coordination, dexterity, sensation, edema, ROM, strength, pain, flexibility, and UE functional use.  IMPAIRMENTS: are limiting patient from ADLs, IADLs, and leisure.   COMORBIDITIES: may have co-morbidities  that affects occupational performance. Patient will benefit from skilled OT to address above impairments and improve overall function.  MODIFICATION OR ASSISTANCE TO COMPLETE EVALUATION: No modification of tasks or assist necessary to complete an evaluation.  OT OCCUPATIONAL PROFILE AND HISTORY: Problem focused assessment: Including review of records relating to presenting problem.  CLINICAL DECISION MAKING: LOW - limited treatment options, no task modification necessary  REHAB POTENTIAL: Good  EVALUATION COMPLEXITY: Low      PLAN:  OT FREQUENCY: 1-2x/week (1x/week for 3 weeks, then progressing to 2x/week as allowed to take on increased force through UE)  OT DURATION: 6 weeks  PLANNED INTERVENTIONS: therapeutic exercise, neuromuscular re-education, manual therapy, scar mobilization, passive range of motion, splinting, moist heat, cryotherapy, and patient/family education  RECOMMENDED OTHER SERVICES: NA  CONSULTED AND AGREED WITH PLAN OF CARE: Patient  PLAN FOR NEXT SESSION: review HEP and add as applicable, reiterate splint wear at all times (especially at night)   Labrittany Wechter, [place], OT 02/14/2023, 9:35 AM

## 2023-02-15 ENCOUNTER — Encounter: Payer: Self-pay | Admitting: Family Medicine

## 2023-02-16 ENCOUNTER — Other Ambulatory Visit (HOSPITAL_BASED_OUTPATIENT_CLINIC_OR_DEPARTMENT_OTHER): Payer: Self-pay

## 2023-02-17 ENCOUNTER — Encounter: Payer: Self-pay | Admitting: Family Medicine

## 2023-02-17 DIAGNOSIS — E612 Magnesium deficiency: Secondary | ICD-10-CM | POA: Diagnosis not present

## 2023-02-17 DIAGNOSIS — R799 Abnormal finding of blood chemistry, unspecified: Secondary | ICD-10-CM | POA: Diagnosis not present

## 2023-02-17 DIAGNOSIS — T56894A Toxic effect of other metals, undetermined, initial encounter: Secondary | ICD-10-CM | POA: Diagnosis not present

## 2023-02-17 DIAGNOSIS — R946 Abnormal results of thyroid function studies: Secondary | ICD-10-CM | POA: Diagnosis not present

## 2023-02-17 DIAGNOSIS — E782 Mixed hyperlipidemia: Secondary | ICD-10-CM | POA: Diagnosis not present

## 2023-02-17 DIAGNOSIS — E039 Hypothyroidism, unspecified: Secondary | ICD-10-CM | POA: Diagnosis not present

## 2023-02-17 DIAGNOSIS — E063 Autoimmune thyroiditis: Secondary | ICD-10-CM | POA: Diagnosis not present

## 2023-02-17 DIAGNOSIS — R790 Abnormal level of blood mineral: Secondary | ICD-10-CM | POA: Diagnosis not present

## 2023-02-17 DIAGNOSIS — R4189 Other symptoms and signs involving cognitive functions and awareness: Secondary | ICD-10-CM | POA: Diagnosis not present

## 2023-02-20 ENCOUNTER — Other Ambulatory Visit (HOSPITAL_BASED_OUTPATIENT_CLINIC_OR_DEPARTMENT_OTHER): Payer: Self-pay

## 2023-02-20 ENCOUNTER — Other Ambulatory Visit (HOSPITAL_BASED_OUTPATIENT_CLINIC_OR_DEPARTMENT_OTHER): Payer: Self-pay | Admitting: Nurse Practitioner

## 2023-02-20 DIAGNOSIS — F5101 Primary insomnia: Secondary | ICD-10-CM

## 2023-02-21 ENCOUNTER — Ambulatory Visit (HOSPITAL_BASED_OUTPATIENT_CLINIC_OR_DEPARTMENT_OTHER): Payer: Medicare Other

## 2023-02-21 ENCOUNTER — Encounter (HOSPITAL_BASED_OUTPATIENT_CLINIC_OR_DEPARTMENT_OTHER): Payer: Self-pay

## 2023-02-21 ENCOUNTER — Other Ambulatory Visit (HOSPITAL_BASED_OUTPATIENT_CLINIC_OR_DEPARTMENT_OTHER): Payer: Self-pay

## 2023-02-21 DIAGNOSIS — R69 Illness, unspecified: Secondary | ICD-10-CM | POA: Diagnosis not present

## 2023-02-23 ENCOUNTER — Ambulatory Visit: Payer: Medicare Other | Admitting: Occupational Therapy

## 2023-02-24 ENCOUNTER — Other Ambulatory Visit (HOSPITAL_BASED_OUTPATIENT_CLINIC_OR_DEPARTMENT_OTHER): Payer: Self-pay

## 2023-02-27 ENCOUNTER — Ambulatory Visit: Payer: Medicare Other | Attending: Orthopedic Surgery | Admitting: Occupational Therapy

## 2023-02-27 DIAGNOSIS — M6281 Muscle weakness (generalized): Secondary | ICD-10-CM | POA: Insufficient documentation

## 2023-02-27 DIAGNOSIS — M25542 Pain in joints of left hand: Secondary | ICD-10-CM | POA: Insufficient documentation

## 2023-02-28 ENCOUNTER — Ambulatory Visit (INDEPENDENT_AMBULATORY_CARE_PROVIDER_SITE_OTHER): Payer: Medicare Other

## 2023-02-28 ENCOUNTER — Encounter (HOSPITAL_BASED_OUTPATIENT_CLINIC_OR_DEPARTMENT_OTHER): Payer: Self-pay

## 2023-02-28 ENCOUNTER — Other Ambulatory Visit (HOSPITAL_BASED_OUTPATIENT_CLINIC_OR_DEPARTMENT_OTHER): Payer: Self-pay | Admitting: Family Medicine

## 2023-02-28 ENCOUNTER — Other Ambulatory Visit (HOSPITAL_BASED_OUTPATIENT_CLINIC_OR_DEPARTMENT_OTHER): Payer: Self-pay

## 2023-02-28 VITALS — BP 114/64 | HR 80 | Temp 96.4°F | Ht 63.0 in | Wt 146.0 lb

## 2023-02-28 DIAGNOSIS — F5101 Primary insomnia: Secondary | ICD-10-CM

## 2023-02-28 DIAGNOSIS — Z0000 Encounter for general adult medical examination without abnormal findings: Secondary | ICD-10-CM | POA: Diagnosis not present

## 2023-02-28 DIAGNOSIS — Z1211 Encounter for screening for malignant neoplasm of colon: Secondary | ICD-10-CM | POA: Diagnosis not present

## 2023-02-28 NOTE — Progress Notes (Signed)
Subjective:   Wendy Joyce is a 77 y.o. female who presents for Medicare Annual (Subsequent) preventive examination.  Review of Systems      Cardiac Risk Factors include: advanced age (>19mn, >>35women);hypertension     Objective:    Today's Vitals   02/28/23 1029  BP: 114/64  Pulse: 80  Temp: (!) 96.4 F (35.8 C)  TempSrc: Oral  SpO2: 96%  Weight: 146 lb (66.2 kg)  Height: '5\' 3"'$  (1.6 m)   Body mass index is 25.86 kg/m.     02/28/2023   10:43 AM 12/23/2020   11:16 AM 02/16/2017    8:39 AM 02/25/2013    8:50 AM  Advanced Directives  Does Patient Have a Medical Advance Directive? Yes Yes Yes Patient has advance directive, copy not in chart  Type of Advance Directive [place] will Living will;Healthcare Power of [place] for mental health treatment  [place] Chart? No - copy requested       Current Medications (verified) Outpatient Encounter Medications as of 02/28/2023  Medication Sig   acyclovir ointment (ZOVIRAX) 5 % Apply 1 application. topically daily as needed (for fever blister).   albuterol (VENTOLIN HFA) 108 (90 Base) MCG/ACT inhaler Inhale 2 puffs into the lungs every 6 (six) hours as needed for wheezing or shortness of breath.   ALPRAZolam (XANAX) 0.25 MG tablet Take 1 to 2 tablets by mouth once daily as needed. Must last 30 days.   ALPRAZolam (XANAX) 0.25 MG tablet Take 1-2 tablets (0.25-0.5 mg total) by mouth daily as needed. **Must last 30 days **   azelastine (ASTELIN) 0.1 % nasal spray Use in each nostril as directed   cetirizine (ZYRTEC) 10 MG tablet Take 10 mg by mouth daily.   COVID-19 mRNA vaccine 2023-2024 (COMIRNATY) SUSP injection Inject into the muscle.   donepezil (ARICEPT) 5 MG tablet Take 1 tablet (5 mg total) by mouth 2 (two) times daily.   estradiol (ESTRACE) 0.1 MG/GM vaginal cream Insert blueberry size amount of cream on finger (or 0.5 grams with applicator)  in vagina daily for 2 weeks, THEN twice weekly.   estradiol (ESTRACE) 0.5 MG tablet Take 1 tablet (0.5 mg total) by mouth daily.   fluticasone (FLONASE) 50 MCG/ACT nasal spray Place 1 spray into both nostrils daily.   influenza vaccine adjuvanted (FLUAD QUADRIVALENT) 0.5 ML injection TO BE ADMINISTERED BY PHARMACIST FOR IMMUNIZATION   influenza vaccine adjuvanted (FLUAD) 0.5 ML injection Inject into the muscle.   levothyroxine (SYNTHROID) 112 MCG tablet Take 1 tablet (112 mcg total) by mouth daily.   montelukast (SINGULAIR) 10 MG tablet Take 1 tablet (10 mg total) by mouth daily for allergies   Naltrexone HCl, Pain, 4.5 MG CAPS Prescribed by psychiatry for depression. Once a day.   polyethylene glycol (MIRALAX / GLYCOLAX) 17 g packet Take 17 g by mouth daily.   progesterone (PROMETRIUM) 100 MG capsule Take 1 capsule (100 mg total) by mouth at bedtime for 7 days, THEN 2 capsules (200 mg total) at bedtime to help with sleep.   progesterone (PROMETRIUM) 100 MG capsule Take 1 capsule (100 mg total) by mouth before bedtime for 1 week. May increase to 2 capsules to help with sleep.   progesterone (PROMETRIUM) 100 MG capsule Take 1 capsule (100 mg total) by mouth at bedtime. May take 1 to 2 capsules at night   RSV vaccine recomb adjuvanted (AREXVY) 120 MCG/0.5ML injection Inject into the muscle.  valACYclovir (VALTREX) 500 MG tablet Take 1 tablet (500 mg total) by mouth 2 (two) times daily.   zaleplon (SONATA) 5 MG capsule Take 1 capsule (5 mg total) by mouth at bedtime as needed for sleep.   Zoster Vaccine Adjuvanted [facility]) injection    [DISCONTINUED] buPROPion (WELLBUTRIN XL) 150 MG 24 hr tablet Take 1 tablet (150 mg total) by mouth.   [DISCONTINUED] oxyCODONE (OXY IR/ROXICODONE) 5 MG immediate release tablet Take 1 tablet (5 mg total) by mouth every 4-6 hours for 7 days.   [DISCONTINUED] prazosin (MINIPRESS) 2 MG capsule 1 TO 3 PO QHS (Patient not taking: Reported on 06/17/2022)   [DISCONTINUED]  sulfamethoxazole-trimethoprim (BACTRIM DS) 800-160 MG tablet Take 1 tablet by mouth 2 times daily for 10 days.   [DISCONTINUED] Vilazodone HCl (VIIBRYD) 10 MG TABS 1 PO QD W/DINNER   No facility-administered encounter medications on file as of 02/28/2023.    Allergies (verified) Adhesive [tape], Atovaquone-proguanil hcl, Bioflavonoids, Citrus, Escitalopram, Escitalopram oxalate, Ezetimibe-simvastatin, Niacin, Niacin and related, Rosuvastatin, Amoxicillin, and Hydrochlorothiazide   History: Past Medical History:  Diagnosis Date   Abdominal pain, generalized 01/29/2015   Abnormal thyroid function test 02/25/2016   ADHD (attention deficit hyperactivity disorder)    Allergic rhinitis, unspecified 01/29/2015   Ankle sprain 03/16/2016   Anxiety 01/29/2015   Arthritis    hands   Chronic kidney disease 2012   kidney stones   Constipation 01/29/2015   Decreased libido 05/02/2018   Disorder of the skin and subcutaneous tissue, unspecified 01/29/2015   DJD (degenerative joint disease)    hand   Dysthymia    Endometriosis    External otitis    Family history of coronary artery disease 05/02/2018   Fatigue due to excessive exertion 02/24/2016   Fever blister    Fibromyalgia 01/29/2015   Frequency of micturition 01/29/2015   Functional dyspepsia 01/29/2015   Gastro-esophageal reflux disease without esophagitis 01/29/2015   Headache(784.0)    High cholesterol    Hyperlipidemia 09/12/2016   Hypersomnia    Hypothyroidism    IBS (irritable bowel syndrome) 05/02/2018   Insomnia    Internal hemorrhoids    Irritable 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 01/29/2015   Labial pain 11/30/2016   Lactose intolerance 12/14/2016   Lactose intolerance in adult 09/12/2016   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   Left ureteral stone    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   Major depressive disorder, single episode, unspecified    Migraine with aura, not intractable, without status migrainosus    Mixed  hyperlipidemia    Mouth pain 01/07/2016   Osteoarthritis of hip    Osteopenia determined by x-ray 03/02/2017   Personal history of estrogen therapy    Phlebitis and thrombophlebitis of the leg    Pityriasis alba 01/29/2015   Pure hypercholesterolemia 06/29/2016   Rectum pain 09/12/2016   Recurrent sinus infections 02/24/2016   Rhinosinusitis 01/07/2016   Rosacea    Sinusitis 12/24/2015   Tinnitus of left ear 123XX123   Umbilical hernia without obstruction and without gangrene [id number]   Umbilical pain [id number]   Unspecified urinary incontinence 01/07/2016   Urticaria    UTI (urinary tract infection)    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   Vitamin D deficiency    Past Surgical History:  Procedure Laterality Date   BREAST ENHANCEMENT SURGERY     BREAST REDUCTION SURGERY Bilateral 2018   COLONOSCOPY WITH PROPOFOL N/A 02/25/2013   Procedure: COLONOSCOPY WITH PROPOFOL;  Surgeon: Garlan Fair, MD;  Location: [facility];  Service: Endoscopy;  Laterality: N/A;   mini facelift  2000   NASAL SINUS SURGERY     REDUCTION MAMMAPLASTY     ROTATOR CUFF REPAIR     Right   ROTATOR CUFF REPAIR     Left   SEPTOPLASTY WITH ETHMOIDECTOMY, AND MAXILLARY ANTROSTOMY Bilateral 2006   TOTAL ABDOMINAL HYSTERECTOMY     ovaries remain   TUBAL LIGATION     umbicial hernia     Family History  Problem Relation Age of Onset   Breast cancer Maternal Grandmother    Stroke Father    Hypertension Father    Cancer Father    Social History   Socioeconomic History   Marital status: Married    Spouse name: Not on file   Number of children: Not on file   Years of education: Not on file   Highest education level: Not on file  Occupational History   Not on file  Tobacco Use   Smoking status: Never   Smokeless tobacco: Never  Vaping Use   Vaping Use: Not on file  Substance and Sexual Activity   Alcohol use: Yes    Alcohol/week: 2.0 standard  drinks of alcohol    Types: 2 Glasses of wine per week    Comment: every night   Drug use: No   Sexual activity: Yes    Partners: Male  Other Topics Concern   Not on file  Social History Narrative   Married   Regular exercise: yes; 2 x a week with a trainer   Caffeine use: coffee daily   Social Determinants of Health   Financial Resource Strain: Low Risk  (02/28/2023)   Overall Financial Resource Strain (CARDIA)    Difficulty of Paying Living Expenses: Not hard at all  Food Insecurity: No Food Insecurity (02/28/2023)   Hunger Vital Sign    Worried About Running Out of Food in the Last Year: Never true    [place] in the Last Year: Never true  Transportation Needs: No Transportation Needs (02/28/2023)   PRAPARE - Hydrologist (Medical): No    Lack of Transportation (Non-Medical): No  Physical Activity: Inactive (02/28/2023)   Exercise Vital Sign    Days of Exercise per Week: 0 days    Minutes of Exercise per Session: 0 min  Stress: No Stress Concern Present (02/28/2023)   [place]    Feeling of Stress : Not at all  Social Connections: [place] (02/28/2023)   Social Connection and Isolation Panel [NHANES]    Frequency of Communication with Friends and Family: More than three times a week    Frequency of Social Gatherings with Friends and Family: More than three times a week    Attends Religious Services: More than 4 times per year    Active Member of [employer] or Organizations: Yes    Attends Music therapist: More than 4 times per year    Marital Status: Married    Tobacco Counseling Counseling given: Not Answered   Clinical Intake:  Pre-visit preparation completed: Yes  Pain : No/denies pain     BMI - recorded: 25.86 Nutritional Status: BMI 25 -29 Overweight Nutritional Risks: None Diabetes: No  How often do you need to have someone help you when  you read instructions, pamphlets, or other written materials from your doctor or pharmacy?: 1 - Never  Diabetic?  No  Interpreter  Needed?: No  Information entered by :: Rolene Arbour LPN   Activities of Daily Living    02/28/2023   10:41 AM 02/28/2023    9:44 AM  In your present state of health, do you have any difficulty performing the following activities:  Hearing? 0 0  Vision? 0 0  Difficulty concentrating or making decisions? 0 0  Walking or climbing stairs? 0 0  Dressing or bathing? 0 0  Doing errands, shopping? 0 0  Preparing Food and eating ? N N  Using the Toilet? N N  In the past six months, have you accidently leaked urine? Tempie Donning  Comment Wears pads. Followed by Urologist   Do you have problems with loss of bowel control? N N  Managing your Medications? N N  Managing your Finances? N N  Housekeeping or managing your Housekeeping? N N    Patient Care Team: [place], Blondell Reveal, MD as PCP - General (Family Medicine) Jerline Pain, MD as PCP - Cardiology (Cardiology)  Indicate any recent Medical Services you may have received from other than [facility] providers in the past year (date may be approximate).     Assessment:   This is a routine wellness examination for [facility].  Hearing/Vision screen Hearing Screening - Comments:: Denies hearing difficulties   Vision Screening - Comments:: Wears rx glasses - up to date with routine eye exams with  Dr Gershon Crane  Dietary issues and exercise activities discussed: Exercise limited by: None identified   Goals Addressed               This Visit's Progress     Lose weight (pt-stated)         Depression Screen    02/28/2023   10:40 AM 11/23/2022    2:48 PM 05/30/2022   10:57 AM 03/25/2022    9:05 AM 03/17/2022    1:43 PM 07/15/2021   11:55 AM  PHQ 2/9 Scores  PHQ - 2 Score 0 1 0 1 0 0  PHQ- 9 Score  6 0     Exception Documentation  Medical reason Medical reason Medical reason Medical reason     Fall Risk    02/28/2023    10:42 AM 02/28/2023    9:44 AM 01/30/2023    5:31 PM 11/23/2022    2:48 PM 05/30/2022   10:57 AM  Fall Risk   Falls in the past year? 0 0 0 0 0  Number falls in past yr: 0   0 0  Injury with Fall? 0   0 0  Risk for fall due to : No Fall Risks   No Fall Risks No Fall Risks  Follow up Falls prevention discussed   Falls evaluation completed Falls evaluation completed    [place]:  Any stairs in or around the home? Yes  If so, are there any without handrails? No  Home free of loose throw rugs in walkways, pet beds, electrical cords, etc? Yes  Adequate lighting in your home to reduce risk of falls? Yes   ASSISTIVE DEVICES UTILIZED TO PREVENT FALLS:  Life alert? No  Use of a cane, walker or w/c? No  Grab bars in the bathroom? No  Shower chair or bench in shower? Yes Elevated toilet seat or a handicapped toilet? Yes   TIMED UP AND GO:  Was the test performed? Yes .  Length of time to ambulate 10 feet:  sec.   Gait steady and fast without  use of assistive device  Cognitive Function:    09/09/2021    2:27 PM  MMSE - Mini Mental State Exam  Orientation to time 5  Orientation to Place 5  Registration 3  Attention/ Calculation 5  Recall 3  Language- name 2 objects 2  Language- repeat 1  Language- follow 3 step command 3  Language- read & follow direction 1  Write a sentence 1  Copy design 1  Total score 30        02/28/2023   10:43 AM  6CIT Screen  What Year? 0 points  What month? 0 points  What time? 0 points  Count back from 20 0 points  Months in reverse 0 points  Repeat phrase 0 points  Total Score 0 points    Immunizations Immunization History  Administered Date(s) Administered   COVID-19, mRNA, vaccine(Comirnaty)12 years and older 10/06/2022   Fluad Quad(high Dose 65+) 10/06/2022   Hep A / Hep B 11/01/2018   Hepatitis A, Adult 11/01/2018, 05/06/2019   Influenza Split 10/05/2009, 10/20/2010, 10/12/2012, 10/07/2013,  10/17/2014, 09/30/2015, 08/06/2018, 09/26/2019, 08/25/2020   Influenza, High Dose Seasonal PF 08/15/2017   Influenza-Unspecified 08/20/2018   PFIZER(Purple Top)SARS-COV-2 Vaccination 01/21/2020, 02/11/2020, 03/30/2021   Pneumococcal Conjugate-13 02/03/2015   Pneumococcal Polysaccharide-23 01/08/2014   Respiratory Syncytial Virus Vaccine,Recomb Aduvanted(Arexvy) 11/07/2022   Tdap 05/26/2009   Typhoid Inactivated 11/01/2018   Zoster Recombinat (Shingrix) 08/10/2018, 06/24/2019   Zoster, Live 01/08/2014    TDAP status: Due, Education has been provided regarding the importance of this vaccine. Advised may receive this vaccine at local pharmacy or Health Dept. Aware to provide a copy of the vaccination record if obtained from local pharmacy or Health Dept. Verbalized acceptance and understanding.  Flu Vaccine status: Up to date  Pneumococcal vaccine status: Up to date  Covid-19 vaccine status: Completed vaccines  Qualifies for Shingles Vaccine? Yes   Zostavax completed Yes   Shingrix Completed?: Yes  Screening Tests Health Maintenance  Topic Date Due   DTaP/Tdap/Td (2 - Td or Tdap) 05/27/2019   COLONOSCOPY (Pts 45-62yr Insurance coverage will need to be confirmed)  02/28/2024 (Originally 02/26/2023)   MAMMOGRAM  08/13/2023   Medicare Annual Wellness (AWV)  02/28/2024   Pneumonia Vaccine 75 Years old  Completed   INFLUENZA VACCINE  Completed   DEXA SCAN  Completed   COVID-19 Vaccine  Completed   Zoster Vaccines- Shingrix  Completed   HPV VACCINES  Aged Out   Hepatitis C Screening  Discontinued    Health Maintenance  Health Maintenance Due  Topic Date Due   DTaP/Tdap/Td (2 - Td or Tdap) 05/27/2019    02/28/23  Mammogram status: Completed 08/12/21. Repeat every year  Bone Density status: Completed 08/19/21. Results reflect: Bone density results: OSTEOPOROSIS. Repeat every   years.  Lung Cancer Screening: (Low Dose CT Chest recommended if Age 75-80years, 30 pack-year  currently smoking OR have quit w/in 15years.) does not qualify.     Additional Screening:    Vision Screening: Recommended annual ophthalmology exams for early detection of glaucoma and other disorders of the eye. Is the patient up to date with their annual eye exam?  Yes  Who is the provider or what is the name of the office in which the patient attends annual eye exams? Dr SGershon CraneIf pt is not established with a provider, would they like to be referred to a provider to establish care? No .   Dental Screening: Recommended annual dental exams for proper oral hygiene  Community Resource Referral / Chronic Care Management:  CRR required this visit?  No   CCM required this visit?  No      Plan:     I have personally reviewed and noted the following in the patient's chart:   Medical and social history Use of alcohol, tobacco or illicit drugs  Current medications and supplements including opioid prescriptions. Patient is not currently taking opioid prescriptions. Functional ability and status Nutritional status Physical activity Advanced directives List of other physicians Hospitalizations, surgeries, and ER visits in previous 12 months Vitals Screenings to include cognitive, depression, and falls Referrals and appointments  In addition, I have reviewed and discussed with patient certain preventive protocols, quality metrics, and best practice recommendations. A written personalized care plan for preventive services as well as general preventive health recommendations were provided to patient.     Criselda Peaches, LPN   [id number]   Nurse Notes: None

## 2023-02-28 NOTE — Patient Instructions (Addendum)
Wendy Joyce , Thank you for taking time to come for your Medicare Wellness Visit. I appreciate your ongoing commitment to your health goals. Please review the following plan we discussed and let me know if I can assist you in the future.   These are the goals we discussed:  Goals       Lose weight (pt-stated)        This is a list of the screening recommended for you and due dates:  Health Maintenance  Topic Date Due   DTaP/Tdap/Td vaccine (2 - Td or Tdap) 05/27/2019   Colon Cancer Screening  02/28/2024*   Mammogram  08/13/2023   Medicare Annual Wellness Visit  02/28/2024   Pneumonia Vaccine  Completed   Flu Shot  Completed   DEXA scan (bone density measurement)  Completed   COVID-19 Vaccine  Completed   Zoster (Shingles) Vaccine  Completed   HPV Vaccine  Aged Out   Hepatitis C Screening: USPSTF Recommendation to screen - Ages 32-79 yo.  Discontinued  *Topic was postponed. The date shown is not the original due date.    Advanced directives: Please bring a copy of your health care power of attorney and living will to the office to be added to your chart at your convenience.   Conditions/risks identified: None  Next appointment: Follow up in one year for your annual wellness visit    Preventive Care 65 Years and Older, Female Preventive care refers to lifestyle choices and visits with your health care provider that can promote health and wellness. What does preventive care include? A yearly physical exam. This is also called an annual well check. Dental exams once or twice a year. Routine eye exams. Ask your health care provider how often you should have your eyes checked. Personal lifestyle choices, including: Daily care of your teeth and gums. Regular physical activity. Eating a healthy diet. Avoiding tobacco and drug use. Limiting alcohol use. Practicing safe sex. Taking low-dose aspirin every day. Taking vitamin and mineral supplements as recommended by your health  care provider. What happens during an annual well check? The services and screenings done by your health care provider during your annual well check will depend on your age, overall health, lifestyle risk factors, and family history of disease. Counseling  Your health care provider may ask you questions about your: Alcohol use. Tobacco use. Drug use. Emotional well-being. Home and relationship well-being. Sexual activity. Eating habits. History of falls. Memory and ability to understand (cognition). Work and work Statistician. Reproductive health. Screening  You may have the following tests or measurements: Height, weight, and BMI. Blood pressure. Lipid and cholesterol levels. These may be checked every 5 years, or more frequently if you are over 69 years old. Skin check. Lung cancer screening. You may have this screening every year starting at age 34 if you have a 30-pack-year history of smoking and currently smoke or have quit within the past 15 years. Fecal occult blood test (FOBT) of the stool. You may have this test every year starting at age 87. Flexible sigmoidoscopy or colonoscopy. You may have a sigmoidoscopy every 5 years or a colonoscopy every 10 years starting at age 56. Hepatitis C blood test. Hepatitis B blood test. Sexually transmitted disease (STD) testing. Diabetes screening. This is done by checking your blood sugar (glucose) after you have not eaten for a while (fasting). You may have this done every 1-3 years. Bone density scan. This is done to screen for osteoporosis. You may have  this done starting at age 58. Mammogram. This may be done every 1-2 years. Talk to your health care provider about how often you should have regular mammograms. Talk with your health care provider about your test results, treatment options, and if necessary, the need for more tests. Vaccines  Your health care provider may recommend certain vaccines, such as: Influenza vaccine. This is  recommended every year. Tetanus, diphtheria, and acellular pertussis (Tdap, Td) vaccine. You may need a Td booster every 10 years. Zoster vaccine. You may need this after age 61. Pneumococcal 13-valent conjugate (PCV13) vaccine. One dose is recommended after age 23. Pneumococcal polysaccharide (PPSV23) vaccine. One dose is recommended after age 51. Talk to your health care provider about which screenings and vaccines you need and how often you need them. This information is not intended to replace advice given to you by your health care provider. Make sure you discuss any questions you have with your health care provider. Document Released: 01/08/2016 Document Revised: 08/31/2016 Document Reviewed: 10/13/2015 Elsevier Interactive Patient Education  2017 [place] Prevention in the Home Falls can cause injuries. They can happen to people of all ages. There are many things you can do to make your home safe and to help prevent falls. What can I do on the outside of my home? Regularly fix the edges of walkways and driveways and fix any cracks. Remove anything that might make you trip as you walk through a door, such as a raised step or threshold. Trim any bushes or trees on the path to your home. Use bright outdoor lighting. Clear any walking paths of anything that might make someone trip, such as rocks or tools. Regularly check to see if handrails are loose or broken. Make sure that both sides of any steps have handrails. Any raised decks and porches should have guardrails on the edges. Have any leaves, snow, or ice cleared regularly. Use sand or salt on walking paths during winter. Clean up any spills in your garage right away. This includes oil or grease spills. What can I do in the bathroom? Use night lights. Install grab bars by the toilet and in the tub and shower. Do not use towel bars as grab bars. Use non-skid mats or decals in the tub or shower. If you need to sit down in  the shower, use a plastic, non-slip stool. Keep the floor dry. Clean up any water that spills on the floor as soon as it happens. Remove soap buildup in the tub or shower regularly. Attach bath mats securely with double-sided non-slip rug tape. Do not have throw rugs and other things on the floor that can make you trip. What can I do in the bedroom? Use night lights. Make sure that you have a light by your bed that is easy to reach. Do not use any sheets or blankets that are too big for your bed. They should not hang down onto the floor. Have a firm chair that has side arms. You can use this for support while you get dressed. Do not have throw rugs and other things on the floor that can make you trip. What can I do in the kitchen? Clean up any spills right away. Avoid walking on wet floors. Keep items that you use a lot in easy-to-reach places. If you need to reach something above you, use a strong step stool that has a grab bar. Keep electrical cords out of the way. Do not use floor polish or  wax that makes floors slippery. If you must use wax, use non-skid floor wax. Do not have throw rugs and other things on the floor that can make you trip. What can I do with my stairs? Do not leave any items on the stairs. Make sure that there are handrails on both sides of the stairs and use them. Fix handrails that are broken or loose. Make sure that handrails are as long as the stairways. Check any carpeting to make sure that it is firmly attached to the stairs. Fix any carpet that is loose or worn. Avoid having throw rugs at the top or bottom of the stairs. If you do have throw rugs, attach them to the floor with carpet tape. Make sure that you have a light switch at the top of the stairs and the bottom of the stairs. If you do not have them, ask someone to add them for you. What else can I do to help prevent falls? Wear shoes that: Do not have high heels. Have rubber bottoms. Are comfortable  and fit you well. Are closed at the toe. Do not wear sandals. If you use a stepladder: Make sure that it is fully opened. Do not climb a closed stepladder. Make sure that both sides of the stepladder are locked into place. Ask someone to hold it for you, if possible. Clearly mark and make sure that you can see: Any grab bars or handrails. First and last steps. Where the edge of each step is. Use tools that help you move around (mobility aids) if they are needed. These include: Canes. Walkers. Scooters. Crutches. Turn on the lights when you go into a dark area. Replace any light bulbs as soon as they burn out. Set up your furniture so you have a clear path. Avoid moving your furniture around. If any of your floors are uneven, fix them. If there are any pets around you, be aware of where they are. Review your medicines with your doctor. Some medicines can make you feel dizzy. This can increase your chance of falling. Ask your doctor what other things that you can do to help prevent falls. This information is not intended to replace advice given to you by your health care provider. Make sure you discuss any questions you have with your health care provider. Document Released: 10/08/2009 Document Revised: 05/19/2016 Document Reviewed: 01/16/2015 Elsevier Interactive Patient Education  2017 [employer].

## 2023-03-02 ENCOUNTER — Ambulatory Visit: Payer: Medicare Other | Admitting: Occupational Therapy

## 2023-03-02 ENCOUNTER — Other Ambulatory Visit (HOSPITAL_BASED_OUTPATIENT_CLINIC_OR_DEPARTMENT_OTHER): Payer: Self-pay

## 2023-03-02 ENCOUNTER — Other Ambulatory Visit: Payer: Self-pay | Admitting: Family Medicine

## 2023-03-02 ENCOUNTER — Encounter: Payer: Self-pay | Admitting: Nurse Practitioner

## 2023-03-02 DIAGNOSIS — M6281 Muscle weakness (generalized): Secondary | ICD-10-CM

## 2023-03-02 DIAGNOSIS — I2510 Atherosclerotic heart disease of native coronary artery without angina pectoris: Secondary | ICD-10-CM

## 2023-03-02 DIAGNOSIS — M25542 Pain in joints of left hand: Secondary | ICD-10-CM | POA: Diagnosis not present

## 2023-03-02 MED ORDER — METFORMIN HCL 500 MG PO TABS
ORAL_TABLET | ORAL | 0 refills | Status: DC
  Filled 2023-03-02: qty 90, 55d supply, fill #0

## 2023-03-02 NOTE — Therapy (Signed)
OUTPATIENT OCCUPATIONAL THERAPY ORTHO  Treatment Note  Patient Name: Wendy Joyce MRN: [id number] DOB:August 14, 1946, 77 y.o., female Today's Date: 02/07/2023  PCP: [place], Blondell Reveal, MD REFERRING PROVIDER: Sherilyn Cooter, MD  END OF SESSION:  OT End of Session - 03/02/23 1518     Visit Number 3    Number of Visits 12    Date for OT Re-Evaluation 03/24/23    Authorization Type [employer] [place]    OT Start Time [id number]    OT Stop Time [id number]    OT Time Calculation (min) 45 min               Past Medical History:  Diagnosis Date   Abdominal pain, generalized 01/29/2015   Abnormal thyroid function test 02/25/2016   ADHD (attention deficit hyperactivity disorder)    Allergic rhinitis, unspecified 01/29/2015   Ankle sprain 03/16/2016   Anxiety 01/29/2015   Arthritis    hands   Chronic kidney disease 2012   kidney stones   Constipation 01/29/2015   Decreased libido 05/02/2018   Disorder of the skin and subcutaneous tissue, unspecified 01/29/2015   DJD (degenerative joint disease)    hand   Dysthymia    Endometriosis    External otitis    Family history of coronary artery disease 05/02/2018   Fatigue due to excessive exertion 02/24/2016   Fever blister    Fibromyalgia 01/29/2015   Frequency of micturition 01/29/2015   Functional dyspepsia 01/29/2015   Gastro-esophageal reflux disease without esophagitis 01/29/2015   Headache(784.0)    High cholesterol    Hyperlipidemia 09/12/2016   Hypersomnia    Hypothyroidism    IBS (irritable bowel syndrome) 05/02/2018   Insomnia    Internal hemorrhoids    Irritable 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 01/29/2015   Labial pain 11/30/2016   Lactose intolerance 12/14/2016   Lactose intolerance in adult 09/12/2016   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   Left ureteral stone    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   Major depressive disorder, single episode, unspecified    Migraine with aura, not intractable, without status  migrainosus    Mixed hyperlipidemia    Mouth pain 01/07/2016   Osteoarthritis of hip    Osteopenia determined by x-ray 03/02/2017   Personal history of estrogen therapy    Phlebitis and thrombophlebitis of the leg    Pityriasis alba 01/29/2015   Pure hypercholesterolemia 06/29/2016   Rectum pain 09/12/2016   Recurrent sinus infections 02/24/2016   Rhinosinusitis 01/07/2016   Rosacea    Sinusitis 12/24/2015   Tinnitus of left ear 123XX123   Umbilical hernia without obstruction and without gangrene [id number]   Umbilical pain [id number]   Unspecified urinary incontinence 01/07/2016   Urticaria    UTI (urinary tract infection)    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   Vitamin D deficiency    Past Surgical History:  Procedure Laterality Date   BREAST ENHANCEMENT SURGERY     BREAST REDUCTION SURGERY Bilateral 2018   COLONOSCOPY WITH PROPOFOL N/A 02/25/2013   Procedure: COLONOSCOPY WITH PROPOFOL;  Surgeon: Garlan Fair, MD;  Location: [facility];  Service: Endoscopy;  Laterality: N/A;   mini facelift  2000   NASAL SINUS SURGERY     REDUCTION MAMMAPLASTY     ROTATOR CUFF REPAIR     Right   ROTATOR CUFF REPAIR     Left   SEPTOPLASTY WITH ETHMOIDECTOMY, AND MAXILLARY ANTROSTOMY Bilateral 2006   TOTAL ABDOMINAL HYSTERECTOMY  ovaries remain   TUBAL LIGATION     umbicial hernia     Patient Active Problem List   Diagnosis Date Noted   Depression, recurrent ([place]) 12/25/2022   Preoperative clearance 11/23/2022   Encounter for counseling 10/14/2022   Influenza 09/18/2022   Pain of left hand 06/29/2022   Vasomotor symptoms due to menopause 05/30/2022   Pre-diabetes 05/30/2022   Anxiety disorder 03/17/2022   Allergic rhinitis 03/17/2022   Constipation 03/17/2022   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   Exertional dyspnea 03/17/2022   Family history of coronary artery disease 03/17/2022   Weakness with dizziness 03/17/2022   Fever  blister 03/17/2022   Frequency of micturition 03/17/2022   Irritable bowel syndrome 03/17/2022   Lactose intolerance 03/17/2022   Left anterior fascicular block [id number]   Lichen planus 123456   Panniculitis 03/17/2022   Refractory migraine with aura 03/17/2022   Statin not tolerated 03/17/2022   Thrombophlebitis of superficial veins of lower extremity 03/17/2022   Bilateral tinnitus 123456   Umbilical hernia 123456   Urinary incontinence 03/17/2022   Urticaria due to drug allergy 03/17/2022   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  Pt reports hand is feeling a lot better, still experiencing some pain mostly at night time. Pt accompanied by: self and significant other (dropped her off)  PERTINENT HISTORY: HTN, hyperlipidemia, Vitamin D deficiency, Pre-diabetes  PRECAUTIONS: Other: no lifting, wearing splint "most of the time" and remove for exercises  WEIGHT BEARING RESTRICTIONS: Yes no lifting  PAIN:  Are you having pain? Yes: NPRS scale: 1.5/10 Pain location: radial side of forearm below thumb Pain description: sharp Aggravating factors: unsure Relieving factors: massage  FALLS: Has patient fallen in last 6 months? No  LIVING ENVIRONMENT: Lives with: lives with their spouse Lives in: House/apartment Stairs: Yes: Internal: art studios is up full flight of steps and  External: 2 steps; none Has following equipment at home: None  PLOF: Independent  PATIENT GOALS: to be able to use my hand as well as I could before  NEXT MD VISIT: 3/11//24  OBJECTIVE:   HAND DOMINANCE: Right  ADLs: Overall ADLs: Currently wearing gown and robe and/or legging style pants primarily due to difficulty with managing pants with button/zipper and not wanting help  FUNCTIONAL OUTCOME MEASURES: Quick Dash: 56.8% disability/symptom score  UPPER EXTREMITY ROM:     Active ROM Right eval Left eval Left 03/02/23  Shoulder flexion     Shoulder abduction     Shoulder adduction     Shoulder extension     Shoulder internal rotation     Shoulder external rotation     Elbow flexion     Elbow extension     Wrist flexion  56 60  Wrist extension  48 54  Wrist ulnar deviation  28 26  Wrist radial deviation  10 16  Wrist pronation  87 90  Wrist supination  86 86  (Blank rows = not tested)  Active ROM Right eval  Left eval Left 03/02/23  Thumb MCP (0-60)  30 40  Thumb IP (0-80)  55 48  Thumb Radial abd/add (0-55)      Thumb Palmar abd/add (0-45)      Thumb Opposition to Small Finger      Index MCP (0-90)      Index PIP (0-100)      Index DIP (0-70)       Long MCP (0-90)       Long PIP (0-100)       Long DIP (0-70)       Ring MCP (0-90)       Ring PIP (0-100)       Ring DIP (0-70)       Little MCP (0-90)       Little PIP (0-100)       Little DIP (0-70)       (Blank rows = not tested)  HAND FUNCTION: Not assessed due to restrictions s/p surgery   COORDINATION: Not assessed due to restrictions s/p surgery  SENSATION: Paresthesias with palpation at scar  EDEMA: swelling in [facility] joint, however per pt has decreased  COGNITION: Overall cognitive status: Within functional limits for tasks assessed    TODAY'S TREATMENT:                                                                                 03/02/23 AROM: Reviewed wrist flexion/extension and thumb IP  and MP flexion, opposition with composite flexion - pt no longer having pain with this movement.  OT instructed pt in thumb abduction to make an "L".  Pt with decreased ROM making "L" due to forward positioning of CMC joint.   PROM: Therapist educating on PROM of wrist flexion/extension, thumb IP and MP flexion, composite flexion to increase ROM as pt now with decreased pain with AROM.  OT providing min cues and demonstration for increased carryover of each exercise. AAROM: OT providing facilitation at MP of thumb for increased flexion/extension and abduction.  OT providing movement and then sustained hold 5 seconds for increased stretch and ROM.  Pt with initial c/o discomfort with movement as well as to touch on palmar side of MP.  OT providing stretching/massage through webspace for increased ROM and desensitization.  Reviewed importance of ROM in this joint as well as desensitization to pain.   02/14/23 Therapeutic exercise: thumb opposition - pain in space between MP and CMC when opposing to small finger.  Composite flexion at little finger - pain in space between MP and CMC.  Pt able to complete x5 each with min increase in pain.  OT placed pt hand on towel during radial/ulnar deviation to decrease strain at wrist, noted much improved technique.  OT added PROM thumb MP flexion with min cues for technique and hand placement.  Pt instructed in AROM and PROM and encouraged to complete PROM within tolerance, return to AROM if needed. Splint check: pt with mild pressure at [facility] joint due to persistent swelling.  Foam padding in place, to decrease rubbing/irritation.  Discussed application of additional padding or coban wrapping to thumb as needed, but not wanting to over-pad thumb area. Massage: OT completed retrograde massage with  lotion along incision and throughout area of swelling in circular motion and linear fashion while educating on hand placement during exercises, massage, and at rest for continued  edema management.     02/07/23 Therapeutic exercise: Educated pt in thumb opposition, opposition with composite flexion at little finger, and thumb IP flexion AROM with blocking.  Pt completed each x5 with good carryover of instruction.  OT reviewed wrist flexion/extension and radial/ulnar deviation from previous therapist.   Splint wear: reiterated importance of wearing splint at all times, only removing for exercise or hygiene/bathing; as pt stating that she would occasionally remove at night due to pain when sleeping. Educated on desensitization along scar and retrograde massage for edema management.   PATIENT EDUCATION: Education details: AROM with/without blocking exercises.  Provided handout from [place] hand exercise book Person educated: Patient Education method: Explanation, Demonstration, Verbal cues, and Handouts Education comprehension: verbalized understanding and returned demonstration  HOME EXERCISE PROGRAM: Access Code: [email or web address] URL: [email or web address] Date: 02/07/2023 Prepared by: [facility]  Exercises - Seated Thumb IP Flexion AROM with Blocking  - 4-6 x daily - 1 sets - 10 reps - 3-5 sec hold - Wrist Extension AROM  - 4-6 x daily - 7 x weekly - 1 sets - 10 reps - 3-5 sec hold - Seated Wrist Radial and Ulnar Deviation AROM  - 4-6 x daily - 1 sets - 10 reps - 3-5 sec hold - Thumb AROM Opposition To All Fingers  - 4-6 x daily - 1 sets - 10 reps - Seated Thumb Composite Flexion AROM  - 4-6 x daily - 1 sets - 10 reps  GOALS: Goals reviewed with patient? Yes  SHORT TERM GOALS: Target date: 03/03/23  Pt will report decreased pain level by 50%. Baseline: Goal status: MET - 03/02/23  2.  Pt will improve composite thumb flexion by 20-30 degrees. Baseline: MCP 30, IP 55 Goal status: NOT MET - +8* on 03/02/23  3.  Pt will improve active wrist flexion by 15-20 degrees, and extension by 10-15 degrees. Baseline: flexion 56,  extension 48 Goal status: NOT MET - +10* on 03/02/23  4.  Patient will be able to utilize Left hand as normal assist during ADLs. Baseline:  Goal status: NOT MET - significantly improved but still not utilizing as normal assist (particularly with pants up/down) 03/02/23   LONG TERM GOALS: Target date: 03/24/23  Pt will regain sufficient hand/wrist strength for home maintenance tasks, and leisure activities. Baseline:  Goal status: IN PROGRESS  2.  Patient will gradually return to her normal activity level without pain. Baseline:  Goal status: IN PROGRESS  3.  Pt will report improved functional use of LUE as evidenced by improved score on Quick DASH to <35% impairment. Baseline: 56.8% Goal status: IN PROGRESS  ASSESSMENT:  CLINICAL IMPRESSION: Patient is 9 weeks (Surgery 12/29/22) from Left thumb trapeziectomy with internal brace suspensioplasty and partial trapezoidectomy. Pt continues to c/o pain around [facility] joint due to tightness in APB with flexion and extension.  Pt tolerated therapist providing PROM at APB with flexion/extension in addition to abduction.  OT providing gentle massage to webspace between thumb and index finger to facilitate reduction in pain as well as stretch in web space.  PERFORMANCE DEFICITS: in functional skills including ADLs, IADLs, coordination, dexterity, sensation, edema, ROM, strength, pain, flexibility, and UE functional use.  IMPAIRMENTS: are limiting patient from ADLs, IADLs, and leisure.   COMORBIDITIES: may have co-morbidities  that affects  occupational performance. Patient will benefit from skilled OT to address above impairments and improve overall function.  MODIFICATION OR ASSISTANCE TO COMPLETE EVALUATION: No modification of tasks or assist necessary to complete an evaluation.  OT OCCUPATIONAL PROFILE AND HISTORY: Problem focused assessment: Including review of records relating to presenting problem.  CLINICAL DECISION MAKING: LOW - limited  treatment options, no task modification necessary  REHAB POTENTIAL: Good  EVALUATION COMPLEXITY: Low      PLAN:  OT FREQUENCY: 1-2x/week (1x/week for 3 weeks, then progressing to 2x/week as allowed to take on increased force through UE)  OT DURATION: 6 weeks  PLANNED INTERVENTIONS: therapeutic exercise, neuromuscular re-education, manual therapy, scar mobilization, passive range of motion, splinting, moist heat, cryotherapy, and patient/family education  RECOMMENDED OTHER SERVICES: NA  CONSULTED AND AGREED WITH PLAN OF CARE: Patient  PLAN FOR NEXT SESSION: review HEP and add as applicable, reiterate splint wear at all times (especially at night)   Deionte Spivack, [place], OT 03/02/2023, 3:19 PM

## 2023-03-03 ENCOUNTER — Other Ambulatory Visit (HOSPITAL_BASED_OUTPATIENT_CLINIC_OR_DEPARTMENT_OTHER): Payer: Self-pay

## 2023-03-03 MED ORDER — ZALEPLON 5 MG PO CAPS
5.0000 mg | ORAL_CAPSULE | Freq: Every evening | ORAL | 1 refills | Status: DC | PRN
  Filled 2023-03-03 – 2023-03-13 (×2): qty 20, 20d supply, fill #0
  Filled 2023-05-22: qty 20, 20d supply, fill #1

## 2023-03-07 ENCOUNTER — Ambulatory Visit: Payer: Medicare Other | Admitting: Occupational Therapy

## 2023-03-07 DIAGNOSIS — M25542 Pain in joints of left hand: Secondary | ICD-10-CM | POA: Diagnosis not present

## 2023-03-07 DIAGNOSIS — M6281 Muscle weakness (generalized): Secondary | ICD-10-CM | POA: Diagnosis not present

## 2023-03-07 NOTE — Therapy (Signed)
OUTPATIENT OCCUPATIONAL THERAPY ORTHO  Treatment Note  Patient Name: Wendy Joyce MRN: [id number] DOB:03-19-1946, 77 y.o., female Today's Date: 02/07/2023  PCP: [place], Blondell Reveal, MD REFERRING PROVIDER: Sherilyn Cooter, MD  END OF SESSION:  OT End of Session - 03/07/23 1904     Visit Number 4    Number of Visits 12    Date for OT Re-Evaluation 03/24/23    Authorization Type [employer] [place]    OT Start Time 75    OT Stop Time 1402    OT Time Calculation (min) 42 min               Past Medical History:  Diagnosis Date   Abdominal pain, generalized 01/29/2015   Abnormal thyroid function test 02/25/2016   ADHD (attention deficit hyperactivity disorder)    Allergic rhinitis, unspecified 01/29/2015   Ankle sprain 03/16/2016   Anxiety 01/29/2015   Arthritis    hands   Chronic kidney disease 2012   kidney stones   Constipation 01/29/2015   Decreased libido 05/02/2018   Disorder of the skin and subcutaneous tissue, unspecified 01/29/2015   DJD (degenerative joint disease)    hand   Dysthymia    Endometriosis    External otitis    Family history of coronary artery disease 05/02/2018   Fatigue due to excessive exertion 02/24/2016   Fever blister    Fibromyalgia 01/29/2015   Frequency of micturition 01/29/2015   Functional dyspepsia 01/29/2015   Gastro-esophageal reflux disease without esophagitis 01/29/2015   Headache(784.0)    High cholesterol    Hyperlipidemia 09/12/2016   Hypersomnia    Hypothyroidism    IBS (irritable bowel syndrome) 05/02/2018   Insomnia    Internal hemorrhoids    Irritable 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 01/29/2015   Labial pain 11/30/2016   Lactose intolerance 12/14/2016   Lactose intolerance in adult 09/12/2016   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   Left ureteral stone    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   Major depressive disorder, single episode, unspecified    Migraine with aura, not intractable, without status  migrainosus    Mixed hyperlipidemia    Mouth pain 01/07/2016   Osteoarthritis of hip    Osteopenia determined by x-ray 03/02/2017   Personal history of estrogen therapy    Phlebitis and thrombophlebitis of the leg    Pityriasis alba 01/29/2015   Pure hypercholesterolemia 06/29/2016   Rectum pain 09/12/2016   Recurrent sinus infections 02/24/2016   Rhinosinusitis 01/07/2016   Rosacea    Sinusitis 12/24/2015   Tinnitus of left ear 123XX123   Umbilical hernia without obstruction and without gangrene 123XX123   Umbilical pain [id number]   Unspecified urinary incontinence 01/07/2016   Urticaria    UTI (urinary tract infection)    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   Vitamin D deficiency    Past Surgical History:  Procedure Laterality Date   BREAST ENHANCEMENT SURGERY     BREAST REDUCTION SURGERY Bilateral 2018   COLONOSCOPY WITH PROPOFOL N/A 02/25/2013   Procedure: COLONOSCOPY WITH PROPOFOL;  Surgeon: Garlan Fair, MD;  Location: [facility];  Service: Endoscopy;  Laterality: N/A;   mini facelift  2000   NASAL SINUS SURGERY     REDUCTION MAMMAPLASTY     ROTATOR CUFF REPAIR     Right   ROTATOR CUFF REPAIR     Left   SEPTOPLASTY WITH ETHMOIDECTOMY, AND MAXILLARY ANTROSTOMY Bilateral 2006   TOTAL ABDOMINAL HYSTERECTOMY  ovaries remain   TUBAL LIGATION     umbicial hernia     Patient Active Problem List   Diagnosis Date Noted   Depression, recurrent ([place]) 12/25/2022   Preoperative clearance 11/23/2022   Encounter for counseling 10/14/2022   Influenza 09/18/2022   Pain of left hand 06/29/2022   Vasomotor symptoms due to menopause 05/30/2022   Pre-diabetes 05/30/2022   Anxiety disorder 03/17/2022   Allergic rhinitis 03/17/2022   Constipation 03/17/2022   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   Exertional dyspnea 03/17/2022   Family history of coronary artery disease 03/17/2022   Weakness with dizziness 03/17/2022   Fever  blister 03/17/2022   Frequency of micturition 03/17/2022   Irritable bowel syndrome 03/17/2022   Lactose intolerance 03/17/2022   Left anterior fascicular block 123456   Lichen planus 123456   Panniculitis 03/17/2022   Refractory migraine with aura 03/17/2022   Statin not tolerated 03/17/2022   Thrombophlebitis of superficial veins of lower extremity 03/17/2022   Bilateral tinnitus 123456   Umbilical hernia 123456   Urinary incontinence 03/17/2022   Urticaria due to drug allergy 03/17/2022   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 Pt accompanied by: self and significant other (dropped her off)  PERTINENT HISTORY: HTN, hyperlipidemia, Vitamin D deficiency, Pre-diabetes  PRECAUTIONS: Other: no lifting, wearing splint "most of the time" and remove for exercises  WEIGHT BEARING RESTRICTIONS: Yes no lifting  PAIN:  Are you having pain? Yes: NPRS scale: 2/10 Pain location: radial side of forearm below thumb Pain description: sharp Aggravating factors: unsure Relieving factors: massage  FALLS: Has patient fallen in last 6 months? No  LIVING ENVIRONMENT: Lives with: lives with their spouse Lives in: House/apartment Stairs: Yes: Internal: art studios is up full flight of steps and External: 2 steps; none Has following equipment at home: None  PLOF:  Independent  PATIENT GOALS: to be able to use my hand as well as I could before  NEXT MD VISIT: 3/11//24  OBJECTIVE:   HAND DOMINANCE: Right  ADLs: Overall ADLs: Currently wearing gown and robe and/or legging style pants primarily due to difficulty with managing pants with button/zipper and not wanting help  FUNCTIONAL OUTCOME MEASURES: Quick Dash: 56.8% disability/symptom score  UPPER EXTREMITY ROM:     Active ROM Right eval Left eval Left 03/02/23  Shoulder flexion     Shoulder abduction     Shoulder adduction     Shoulder extension     Shoulder internal rotation     Shoulder external rotation     Elbow flexion     Elbow extension     Wrist flexion  56 60  Wrist extension  48 54  Wrist ulnar deviation  28 26  Wrist radial deviation  10 16  Wrist pronation  87 90  Wrist supination  86 86  (Blank rows = not tested)  Active ROM Right eval Left eval Left 03/02/23  Thumb MCP (0-60)  30 40  Thumb IP (0-80)  55 48  Thumb Radial abd/add (0-55)      Thumb Palmar abd/add (0-45)      Thumb Opposition to Small Finger      Index MCP (0-90)      Index PIP (0-100)      Index DIP (0-70)       Long MCP (0-90)       Long PIP (0-100)       Long DIP (0-70)       Ring MCP (0-90)       Ring PIP (0-100)       Ring DIP (0-70)       Little MCP (0-90)       Little PIP (0-100)       Little DIP (0-70)       (Blank rows = not tested)  HAND FUNCTION: Not assessed due to restrictions s/p surgery   COORDINATION: Not assessed due to restrictions s/p surgery  SENSATION: Paresthesias with palpation at scar  EDEMA: swelling in [facility] joint, however per pt has decreased  COGNITION: Overall cognitive status: Within functional limits for tasks assessed    TODAY'S TREATMENT:                                                                                 03/07/23 Manual therapy: OT providing gentle massage around thumb throughout thenar eminence, webspace, and around MP joint.  Pt  reports tenderness at MP joint.  OT then engaged in manual facilitation of thumb at MP joint with focus on flexion/extension and adduction towards index finger.  PROM: OT instructed pt in PROM at MP joint with focus on flexion/extension and adduction towards index finger.  Pt benefiting from demonstration and verbal cues to ensure proper technique and positioning. Resistance: OT educated pt on use of mild resistance to facilitate increased thumb adduction.  Utilized light resistance (tan) putty to facilitate increased adduction against mild resistance.  OT portioning putty in half to allow for increased success with movement.  OT instructed pt in key pinch with focus on maintaining IP, MP joints in extension with focus on ROM at [facility] joint against mild resistance.  OT then introduced abduction against self-anchored resistance with rubber band.  Pt demonstrating increased difficulty with rubber band, therefore encouraged pt to focus on use of theraputty and may attempt rubber band at future sessions.   03/02/23 AROM: Reviewed wrist flexion/extension and thumb IP and MP flexion, opposition with composite flexion - pt no longer having pain with this movement.  OT instructed pt in thumb abduction to make an "L".  Pt with decreased ROM making "L" due to forward positioning of CMC joint.   PROM: Therapist educating on PROM of wrist flexion/extension, thumb IP and MP flexion, composite flexion to increase ROM as pt now with decreased pain with AROM.  OT providing min cues and demonstration for increased carryover of each exercise. AAROM: OT providing facilitation at MP of thumb for increased flexion/extension and abduction.  OT providing movement and then sustained hold 5 seconds for increased stretch and ROM.  Pt with initial c/o discomfort with movement as well as to touch on palmar side of MP.  OT providing stretching/massage through webspace for increased ROM and desensitization.  Reviewed importance of ROM in  this joint as well as desensitization to pain.   02/14/23 Therapeutic exercise: thumb opposition - pain in space between MP and CMC when opposing to small finger.  Composite flexion at little finger - pain in space between MP and CMC.  Pt able to complete x5 each with min increase in pain.  OT placed pt hand on towel during radial/ulnar deviation to decrease strain at wrist, noted much improved technique.  OT added PROM thumb MP flexion with min cues for technique and hand placement.  Pt instructed in AROM and PROM and encouraged to complete PROM within tolerance, return to AROM if needed. Splint check: pt with mild pressure at [facility] joint due to persistent swelling.  Foam padding in place, to decrease rubbing/irritation.  Discussed application of additional padding or coban wrapping to thumb as needed, but not wanting to over-pad thumb area. Massage: OT completed retrograde massage with lotion along incision and throughout area of swelling in circular motion and linear fashion while educating on hand placement during exercises, massage, and at rest for continued edema management.     PATIENT EDUCATION: Education details: Increased resistance exercises and focus on thumb adduction towards index finger. Person educated: Patient Education method: Explanation, Demonstration, Verbal cues, and Handouts Education comprehension: verbalized understanding and returned demonstration  HOME EXERCISE PROGRAM: Access Code: [email or web address] URL: [email or web address] Date: 03/07/2023 Prepared by: [facility]  Exercises - Seated Thumb IP Flexion AROM with Blocking  - 4-6 x daily - 1 sets - 10 reps - 3-5 sec hold - Wrist Extension AROM  - 4-6 x daily - 7 x weekly - 1 sets - 10 reps - 3-5 sec hold - Seated Wrist Radial and Ulnar Deviation AROM  - 4-6 x daily - 1 sets - 10 reps - 3-5 sec hold - Thumb AROM Opposition To All Fingers  - 4-6 x daily - 1 sets - 10 reps -  Seated Thumb Composite Flexion AROM  - 4-6 x daily - 1 sets - 10 reps - Seated Thumb MP Flexion PROM  - 4-6 x daily - 1 sets - 10 reps - Thumb AROM Radial Abduction and Adduction  - 4-6 x daily - 1 sets - 10 reps - Thumb Abduction/Adduction Strengthening With Putty  - 4-6 x daily - 1 sets - 10 reps - Key Pinch with Putty  - 4-6 x daily - 1 sets - 10 reps - Thumb Flexion with Resistance  - 4-6 x daily - 1 sets - 10 reps  GOALS: Goals reviewed with patient? Yes  SHORT TERM GOALS: Target date: 03/03/23  Pt will report decreased pain level by 50%. Baseline: Goal status: MET - 03/02/23  2.  Pt will improve composite thumb flexion by 20-30 degrees. Baseline: MCP 30, IP 55 Goal status: NOT MET - +8* on 03/02/23  3.  Pt will improve active wrist flexion by 15-20 degrees, and extension by 10-15 degrees. Baseline: flexion 56, extension 48 Goal status: NOT MET - +10* on 03/02/23  4.  Patient will be able to utilize Left hand as normal assist during ADLs. Baseline:  Goal status: NOT MET - significantly improved but still not utilizing as normal assist (particularly with pants up/down) 03/02/23   LONG TERM GOALS: Target date: 03/24/23  Pt will regain sufficient hand/wrist strength for home maintenance tasks, and leisure activities. Baseline:  Goal status: IN PROGRESS  2.  Patient will gradually return to her normal activity level without pain. Baseline:  Goal status: IN PROGRESS  3.  Pt will report improved functional use of LUE as evidenced by improved score on Quick DASH to <35% impairment. Baseline: 56.8% Goal status: IN PROGRESS  ASSESSMENT:  CLINICAL IMPRESSION: Patient is 9 weeks and 5 days (Surgery 12/29/22) from Left thumb trapeziectomy with internal brace suspensioplasty and partial trapezoidectomy. Pt continues to c/o pain around [facility] joint due to tightness in APB with flexion and extension.  Pt tolerated therapist providing PROM at APB with flexion/extension in addition to increased  focus on adduction.  OT providing gentle massage to webspace between thumb and index finger to facilitate reduction in pain as well as stretch in web space.  Pt tolerating increased ROM and movement against mild (tan putty) resistance.    PERFORMANCE DEFICITS: in functional skills including ADLs, IADLs, coordination, dexterity, sensation, edema, ROM, strength, pain, flexibility, and UE functional use.  IMPAIRMENTS: are limiting patient from ADLs, IADLs, and leisure.   COMORBIDITIES: may have co-morbidities  that affects occupational performance. Patient will benefit from skilled OT to address above impairments and improve overall function.  MODIFICATION OR ASSISTANCE TO COMPLETE EVALUATION: No modification of tasks or assist necessary to complete an evaluation.  OT OCCUPATIONAL PROFILE AND HISTORY: Problem focused assessment: Including review of records relating to presenting problem.  CLINICAL DECISION MAKING: LOW - limited treatment options, no task modification necessary  REHAB POTENTIAL: Good  EVALUATION COMPLEXITY: Low      PLAN:  OT FREQUENCY: 1-2x/week (1x/week for 3 weeks, then progressing to 2x/week as allowed to take on increased force through UE)  OT DURATION: 6 weeks  PLANNED INTERVENTIONS: therapeutic exercise, neuromuscular re-education, manual therapy, scar mobilization, passive range of motion, splinting, moist heat, cryotherapy, and patient/family education  RECOMMENDED OTHER SERVICES: NA  CONSULTED AND AGREED WITH PLAN OF CARE: Patient  PLAN FOR NEXT SESSION: review HEP and add as applicable, focus on thumb adduction and increased stability against mild resistance.   Simonne Come, [place] 03/08/2023, 9:05 AM

## 2023-03-09 ENCOUNTER — Ambulatory Visit: Payer: Medicare Other | Admitting: Occupational Therapy

## 2023-03-09 DIAGNOSIS — M6281 Muscle weakness (generalized): Secondary | ICD-10-CM | POA: Diagnosis not present

## 2023-03-09 DIAGNOSIS — M25542 Pain in joints of left hand: Secondary | ICD-10-CM | POA: Diagnosis not present

## 2023-03-09 NOTE — Therapy (Addendum)
OUTPATIENT OCCUPATIONAL THERAPY ORTHO  Treatment Note  Patient Name: Wendy Joyce MRN: [id number] DOB:1946-01-22, 77 y.o., female Today's Date: 02/07/2023  PCP: [place], Blondell Reveal, MD REFERRING PROVIDER: Sherilyn Cooter, MD  END OF SESSION:  OT End of Session - 03/09/23 1559     Visit Number 5    Number of Visits 12    Date for OT Re-Evaluation 03/24/23    Authorization Type [employer] [place]    OT Start Time [id number]    OT Stop Time 1446    OT Time Calculation (min) 44 min               Past Medical History:  Diagnosis Date   Abdominal pain, generalized 01/29/2015   Abnormal thyroid function test 02/25/2016   ADHD (attention deficit hyperactivity disorder)    Allergic rhinitis, unspecified 01/29/2015   Ankle sprain 03/16/2016   Anxiety 01/29/2015   Arthritis    hands   Chronic kidney disease 2012   kidney stones   Constipation 01/29/2015   Decreased libido 05/02/2018   Disorder of the skin and subcutaneous tissue, unspecified 01/29/2015   DJD (degenerative joint disease)    hand   Dysthymia    Endometriosis    External otitis    Family history of coronary artery disease 05/02/2018   Fatigue due to excessive exertion 02/24/2016   Fever blister    Fibromyalgia 01/29/2015   Frequency of micturition 01/29/2015   Functional dyspepsia 01/29/2015   Gastro-esophageal reflux disease without esophagitis 01/29/2015   Headache(784.0)    High cholesterol    Hyperlipidemia 09/12/2016   Hypersomnia    Hypothyroidism    IBS (irritable bowel syndrome) 05/02/2018   Insomnia    Internal hemorrhoids    Irritable 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 01/29/2015   Labial pain 11/30/2016   Lactose intolerance 12/14/2016   Lactose intolerance in adult 09/12/2016   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   Left ureteral stone    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   Major depressive disorder, single episode, unspecified    Migraine with aura, not intractable, without status  migrainosus    Mixed hyperlipidemia    Mouth pain 01/07/2016   Osteoarthritis of hip    Osteopenia determined by x-ray 03/02/2017   Personal history of estrogen therapy    Phlebitis and thrombophlebitis of the leg    Pityriasis alba 01/29/2015   Pure hypercholesterolemia 06/29/2016   Rectum pain 09/12/2016   Recurrent sinus infections 02/24/2016   Rhinosinusitis 01/07/2016   Rosacea    Sinusitis 12/24/2015   Tinnitus of left ear 123XX123   Umbilical hernia without obstruction and without gangrene 123XX123   Umbilical pain [id number]   Unspecified urinary incontinence 01/07/2016   Urticaria    UTI (urinary tract infection)    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   Vitamin D deficiency    Past Surgical History:  Procedure Laterality Date   BREAST ENHANCEMENT SURGERY     BREAST REDUCTION SURGERY Bilateral 2018   COLONOSCOPY WITH PROPOFOL N/A 02/25/2013   Procedure: COLONOSCOPY WITH PROPOFOL;  Surgeon: Garlan Fair, MD;  Location: [facility];  Service: Endoscopy;  Laterality: N/A;   mini facelift  2000   NASAL SINUS SURGERY     REDUCTION MAMMAPLASTY     ROTATOR CUFF REPAIR     Right   ROTATOR CUFF REPAIR     Left   SEPTOPLASTY WITH ETHMOIDECTOMY, AND MAXILLARY ANTROSTOMY Bilateral 2006   TOTAL ABDOMINAL HYSTERECTOMY  ovaries remain   TUBAL LIGATION     umbicial hernia     Patient Active Problem List   Diagnosis Date Noted   Depression, recurrent ([place]) 12/25/2022   Preoperative clearance 11/23/2022   Encounter for counseling 10/14/2022   Influenza 09/18/2022   Pain of left hand 06/29/2022   Vasomotor symptoms due to menopause 05/30/2022   Pre-diabetes 05/30/2022   Anxiety disorder 03/17/2022   Allergic rhinitis 03/17/2022   Constipation 03/17/2022   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   Exertional dyspnea 03/17/2022   Family history of coronary artery disease 03/17/2022   Weakness with dizziness 03/17/2022   Fever  blister 03/17/2022   Frequency of micturition 03/17/2022   Irritable bowel syndrome 03/17/2022   Lactose intolerance 03/17/2022   Left anterior fascicular block 123456   Lichen planus 123456   Panniculitis 03/17/2022   Refractory migraine with aura 03/17/2022   Statin not tolerated 03/17/2022   Thrombophlebitis of superficial veins of lower extremity 03/17/2022   Bilateral tinnitus 123456   Umbilical hernia 123456   Urinary incontinence 03/17/2022   Urticaria due to drug allergy 03/17/2022   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 Pt accompanied by: self and significant other (dropped her off)  PERTINENT HISTORY: HTN, hyperlipidemia, Vitamin D deficiency, Pre-diabetes  PRECAUTIONS: Other: no lifting, wearing splint "most of the time" and remove for exercises  WEIGHT BEARING RESTRICTIONS: Yes no lifting  PAIN:  Are you having pain? Yes: NPRS scale: 1-2/10 Pain location: radial side of forearm below thumb Pain description: sharp Aggravating factors: unsure Relieving factors: massage  FALLS: Has patient fallen in last 6 months? No  LIVING ENVIRONMENT: Lives with: lives with their spouse Lives in: House/apartment Stairs: Yes: Internal: art studios is up full flight of steps and External: 2 steps; none Has following equipment at home: None  PLOF:  Independent  PATIENT GOALS: to be able to use my hand as well as I could before  NEXT MD VISIT: 3/11//24  OBJECTIVE:   HAND DOMINANCE: Right  ADLs: Overall ADLs: Currently wearing gown and robe and/or legging style pants primarily due to difficulty with managing pants with button/zipper and not wanting help  FUNCTIONAL OUTCOME MEASURES: Quick Dash: 56.8% disability/symptom score  UPPER EXTREMITY ROM:     Active ROM Right eval Left eval Left 03/02/23  Shoulder flexion     Shoulder abduction     Shoulder adduction     Shoulder extension     Shoulder internal rotation     Shoulder external rotation     Elbow flexion     Elbow extension     Wrist flexion  56 60  Wrist extension  48 54  Wrist ulnar deviation  28 26  Wrist radial deviation  10 16  Wrist pronation  87 90  Wrist supination  86 86  (Blank rows = not tested)  Active ROM Right eval Left eval Left 03/02/23  Thumb MCP (0-60)  30 40  Thumb IP (0-80)  55 48  Thumb Radial abd/add (0-55)      Thumb Palmar abd/add (0-45)      Thumb Opposition to Small Finger      Index MCP (0-90)      Index PIP (0-100)      Index DIP (0-70)       Long MCP (0-90)       Long PIP (0-100)       Long DIP (0-70)       Ring MCP (0-90)       Ring PIP (0-100)       Ring DIP (0-70)       Little MCP (0-90)       Little PIP (0-100)       Little DIP (0-70)       (Blank rows = not tested)  HAND FUNCTION: Not assessed due to restrictions s/p surgery   COORDINATION: Not assessed due to restrictions s/p surgery  SENSATION: Paresthesias with palpation at scar  EDEMA: swelling in [facility] joint, however per pt has decreased  COGNITION: Overall cognitive status: Within functional limits for tasks assessed    TODAY'S TREATMENT:                                                                                 03/09/23 Manual therapy: OT completing joint mobilizations at [facility] and MP thumb joint with focus on adduction, extension, and  circumduction.  Pt reporting mild pain in thumb with mobilizations and pressure.  OT facilitating stretch and mobility in thenar eminence to facility increased thumb positioning and ability to increase flattening of hand. Therapeutic exercise: engaged in thumb abduction/adduction in "L" pattern on table top and pillow surface to allow for increased support and ROM.  Reviewed thumb exercises against mild resistance with use of foam tubing, pt to continue to utilize putty at home. Educated on current status in surgery recovery and typical results at 10 weeks s/p surgery.  Pt reports frustration still with decreased mobility of thumb, but appreciative of reassurance of current status in regards to recovery timeline.   03/07/23 Manual therapy: OT providing gentle massage around thumb throughout thenar eminence, webspace, and around MP joint.  Pt reports tenderness at MP joint.  OT then engaged in manual facilitation of thumb at MP joint with focus on flexion/extension and adduction towards index finger.  PROM: OT instructed pt in PROM at MP joint with focus on flexion/extension and adduction towards index finger.  Pt benefiting from demonstration and verbal cues to ensure proper technique and positioning. Resistance: OT educated pt on use of mild resistance to facilitate increased thumb adduction.  Utilized light resistance (tan) putty to facilitate increased adduction against mild resistance.  OT portioning putty in half to allow for increased success with movement.  OT instructed pt in key pinch with focus on maintaining IP, MP joints in extension with focus on ROM at [facility] joint against mild resistance.  OT then introduced abduction against self-anchored resistance with rubber band.  Pt demonstrating increased difficulty with rubber band, therefore encouraged pt to focus on use of theraputty and may attempt rubber band at future sessions.   03/02/23 AROM: Reviewed wrist flexion/extension and thumb IP  and MP  flexion, opposition with composite flexion - pt no longer having pain with this movement.  OT instructed pt in thumb abduction to make an "L".  Pt with decreased ROM making "L" due to forward positioning of CMC joint.   PROM: Therapist educating on PROM of wrist flexion/extension, thumb IP and MP flexion, composite flexion to increase ROM as pt now with decreased pain with AROM.  OT providing min cues and demonstration for increased carryover of each exercise. AAROM: OT providing facilitation at MP of thumb for increased flexion/extension and abduction.  OT providing movement and then sustained hold 5 seconds for increased stretch and ROM.  Pt with initial c/o discomfort with movement as well as to touch on palmar side of MP.  OT providing stretching/massage through webspace for increased ROM and desensitization.  Reviewed importance of ROM in this joint as well as desensitization to pain.   PATIENT EDUCATION: Education details: Increased resistance exercises and focus on thumb adduction towards index finger. Person educated: Patient Education method: Explanation, Demonstration, Verbal cues, and Handouts Education comprehension: verbalized understanding and returned demonstration  HOME EXERCISE PROGRAM: Access Code: [email or web address] URL: [email or web address] Date: 03/07/2023 Prepared by: [facility]  Exercises - Seated Thumb IP Flexion AROM with Blocking  - 4-6 x daily - 1 sets - 10 reps - 3-5 sec hold - Wrist Extension AROM  - 4-6 x daily - 7 x weekly - 1 sets - 10 reps - 3-5 sec hold - Seated Wrist Radial and Ulnar Deviation AROM  - 4-6 x daily - 1 sets - 10 reps - 3-5 sec hold - Thumb AROM Opposition To All Fingers  - 4-6 x daily - 1 sets - 10 reps - Seated Thumb Composite Flexion AROM  - 4-6 x daily - 1 sets - 10 reps - Seated Thumb MP Flexion PROM  - 4-6 x daily - 1 sets - 10 reps - Thumb AROM Radial Abduction and Adduction  - 4-6 x daily - 1  sets - 10 reps - Thumb Abduction/Adduction Strengthening With Putty  - 4-6 x daily - 1 sets - 10 reps - Key Pinch with Putty  - 4-6 x daily - 1 sets - 10 reps - Thumb Flexion with Resistance  - 4-6 x daily - 1 sets - 10 reps  GOALS: Goals reviewed with patient? Yes  SHORT TERM GOALS: Target date: 03/03/23  Pt will report decreased pain level by 50%. Baseline: Goal status: MET - 03/02/23  2.  Pt will improve composite thumb flexion by 20-30 degrees. Baseline: MCP 30, IP 55 Goal status: NOT MET - +8* on 03/02/23  3.  Pt will improve active wrist flexion by 15-20 degrees, and extension by 10-15 degrees. Baseline: flexion 56, extension 48 Goal status: NOT MET - +10* on 03/02/23  4.  Patient will be able to utilize Left hand as normal assist during ADLs. Baseline:  Goal status: NOT MET - significantly improved but still not utilizing as normal assist (particularly with pants up/down) 03/02/23   LONG TERM GOALS: Target date: 03/24/23  Pt will regain sufficient hand/wrist strength for home maintenance tasks, and leisure activities. Baseline:  Goal status: IN PROGRESS  2.  Patient will gradually return to her normal activity level without pain. Baseline:  Goal status: IN PROGRESS  3.  Pt will report improved functional use of LUE as evidenced by improved score on Quick DASH to <35% impairment. Baseline: 56.8% Goal status: IN PROGRESS  ASSESSMENT:  CLINICAL IMPRESSION: Patient is 10 weeks (Surgery 12/29/22) from Left thumb trapeziectomy with internal brace suspensioplasty and partial trapezoidectomy. Pt continues to c/o pain around [facility] joint due to tightness in APB with flexion and extension.  Pt tolerated therapist providing joint mobilization at [facility] and MP joint with focus on flexion/extension and adduction.  OT providing gentle massage to webspace between thumb and index finger to facilitate reduction in pain as well as stretch in web space.  Pt demonstrating carryover of education and  self-ROM exercises.    PERFORMANCE DEFICITS: in functional skills including ADLs, IADLs, coordination, dexterity, sensation, edema, ROM, strength, pain, flexibility, and UE functional use.  IMPAIRMENTS: are limiting patient from ADLs, IADLs, and leisure.   COMORBIDITIES: may have co-morbidities  that affects occupational performance. Patient will benefit from skilled OT to address above impairments and improve overall function.  MODIFICATION OR ASSISTANCE TO COMPLETE EVALUATION: No modification of tasks or assist necessary to complete an evaluation.  OT OCCUPATIONAL PROFILE AND HISTORY: Problem focused assessment: Including review of records relating to presenting problem.  CLINICAL DECISION MAKING: LOW - limited treatment options, no task modification necessary  REHAB POTENTIAL: Good  EVALUATION COMPLEXITY: Low      PLAN:  OT FREQUENCY: 1-2x/week (1x/week for 3 weeks, then progressing to 2x/week as allowed to take on increased force through UE)  OT DURATION: 6 weeks  PLANNED INTERVENTIONS: therapeutic exercise, neuromuscular re-education, manual therapy, scar mobilization, passive range of motion, splinting, moist heat, cryotherapy, and patient/family education  RECOMMENDED OTHER SERVICES: NA  CONSULTED AND AGREED WITH PLAN OF CARE: Patient  PLAN FOR NEXT SESSION: review HEP and add as applicable, focus on thumb adduction and increased stability against mild resistance.   Simonne Come, OT 03/09/2023, 4:00 PM

## 2023-03-13 ENCOUNTER — Other Ambulatory Visit (HOSPITAL_BASED_OUTPATIENT_CLINIC_OR_DEPARTMENT_OTHER): Payer: Self-pay

## 2023-03-14 ENCOUNTER — Ambulatory Visit: Payer: Medicare Other | Admitting: Occupational Therapy

## 2023-03-14 DIAGNOSIS — M6281 Muscle weakness (generalized): Secondary | ICD-10-CM | POA: Diagnosis not present

## 2023-03-14 DIAGNOSIS — M25542 Pain in joints of left hand: Secondary | ICD-10-CM

## 2023-03-14 NOTE — Therapy (Signed)
OUTPATIENT OCCUPATIONAL THERAPY ORTHO  Treatment Note  Patient Name: Wendy Joyce MRN: [id number] DOB:08-11-1946, 77 y.o., female Today's Date: 02/07/2023  PCP: [place], Blondell Reveal, MD REFERRING PROVIDER: Sherilyn Cooter, MD  END OF SESSION:  OT End of Session - 03/14/23 1325     Visit Number 6    Number of Visits 12    Date for OT Re-Evaluation 03/24/23    Authorization Type [employer] [place]    OT Start Time 1322    OT Stop Time [id number]    OT Time Calculation (min) 40 min                Past Medical History:  Diagnosis Date   Abdominal pain, generalized 01/29/2015   Abnormal thyroid function test 02/25/2016   ADHD (attention deficit hyperactivity disorder)    Allergic rhinitis, unspecified 01/29/2015   Ankle sprain 03/16/2016   Anxiety 01/29/2015   Arthritis    hands   Chronic kidney disease 2012   kidney stones   Constipation 01/29/2015   Decreased libido 05/02/2018   Disorder of the skin and subcutaneous tissue, unspecified 01/29/2015   DJD (degenerative joint disease)    hand   Dysthymia    Endometriosis    External otitis    Family history of coronary artery disease 05/02/2018   Fatigue due to excessive exertion 02/24/2016   Fever blister    Fibromyalgia 01/29/2015   Frequency of micturition 01/29/2015   Functional dyspepsia 01/29/2015   Gastro-esophageal reflux disease without esophagitis 01/29/2015   Headache(784.0)    High cholesterol    Hyperlipidemia 09/12/2016   Hypersomnia    Hypothyroidism    IBS (irritable bowel syndrome) 05/02/2018   Insomnia    Internal hemorrhoids    Irritable 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 01/29/2015   Labial pain 11/30/2016   Lactose intolerance 12/14/2016   Lactose intolerance in adult 09/12/2016   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   Left ureteral stone    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   Major depressive disorder, single episode, unspecified    Migraine with aura, not intractable, without status  migrainosus    Mixed hyperlipidemia    Mouth pain 01/07/2016   Osteoarthritis of hip    Osteopenia determined by x-ray 03/02/2017   Personal history of estrogen therapy    Phlebitis and thrombophlebitis of the leg    Pityriasis alba 01/29/2015   Pure hypercholesterolemia 06/29/2016   Rectum pain 09/12/2016   Recurrent sinus infections 02/24/2016   Rhinosinusitis 01/07/2016   Rosacea    Sinusitis 12/24/2015   Tinnitus of left ear 123XX123   Umbilical hernia without obstruction and without gangrene [id number]   Umbilical pain [id number]   Unspecified urinary incontinence 01/07/2016   Urticaria    UTI (urinary tract infection)    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   Vitamin D deficiency    Past Surgical History:  Procedure Laterality Date   BREAST ENHANCEMENT SURGERY     BREAST REDUCTION SURGERY Bilateral 2018   COLONOSCOPY WITH PROPOFOL N/A 02/25/2013   Procedure: COLONOSCOPY WITH PROPOFOL;  Surgeon: Garlan Fair, MD;  Location: [facility];  Service: Endoscopy;  Laterality: N/A;   mini facelift  2000   NASAL SINUS SURGERY     REDUCTION MAMMAPLASTY     ROTATOR CUFF REPAIR     Right   ROTATOR CUFF REPAIR     Left   SEPTOPLASTY WITH ETHMOIDECTOMY, AND MAXILLARY ANTROSTOMY Bilateral 2006   TOTAL ABDOMINAL HYSTERECTOMY  ovaries remain   TUBAL LIGATION     umbicial hernia     Patient Active Problem List   Diagnosis Date Noted   Depression, recurrent ([place]) 12/25/2022   Preoperative clearance 11/23/2022   Encounter for counseling 10/14/2022   Influenza 09/18/2022   Pain of left hand 06/29/2022   Vasomotor symptoms due to menopause 05/30/2022   Pre-diabetes 05/30/2022   Anxiety disorder 03/17/2022   Allergic rhinitis 03/17/2022   Constipation 03/17/2022   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   Exertional dyspnea 03/17/2022   Family history of coronary artery disease 03/17/2022   Weakness with dizziness 03/17/2022   Fever  blister 03/17/2022   Frequency of micturition 03/17/2022   Irritable bowel syndrome 03/17/2022   Lactose intolerance 03/17/2022   Left anterior fascicular block [id number]   Lichen planus 123456   Panniculitis 03/17/2022   Refractory migraine with aura 03/17/2022   Statin not tolerated 03/17/2022   Thrombophlebitis of superficial veins of lower extremity 03/17/2022   Bilateral tinnitus 123456   Umbilical hernia 123456   Urinary incontinence 03/17/2022   Urticaria due to drug allergy 03/17/2022   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 Pt accompanied by: self and significant other (dropped her off)  PERTINENT HISTORY: HTN, hyperlipidemia, Vitamin D deficiency, Pre-diabetes  PRECAUTIONS: Other: no lifting, wearing splint "most of the time" and remove for exercises  WEIGHT BEARING RESTRICTIONS: Yes no lifting  PAIN:  Are you having pain? Yes: NPRS scale: 1/10 Pain location: radial side of forearm below thumb Pain description: sharp Aggravating factors: unsure Relieving factors: massage  FALLS: Has patient fallen in last 6 months? No  LIVING ENVIRONMENT: Lives with: lives with their spouse Lives in: House/apartment Stairs: Yes: Internal: art studios is up full flight of steps and External: 2 steps; none Has following equipment at home: None  PLOF: Independent  PATIENT GOALS: to be able to  use my hand as well as I could before  NEXT MD VISIT: 3/11//24  OBJECTIVE:   HAND DOMINANCE: Right  ADLs: Overall ADLs: Currently wearing gown and robe and/or legging style pants primarily due to difficulty with managing pants with button/zipper and not wanting help  FUNCTIONAL OUTCOME MEASURES: Quick Dash: 56.8% disability/symptom score  UPPER EXTREMITY ROM:     Active ROM Right eval Left eval Left 03/02/23  Shoulder flexion     Shoulder abduction     Shoulder adduction     Shoulder extension     Shoulder internal rotation     Shoulder external rotation     Elbow flexion     Elbow extension     Wrist flexion  56 60  Wrist extension  48 54  Wrist ulnar deviation  28 26  Wrist radial deviation  10 16  Wrist pronation  87 90  Wrist supination  86 86  (Blank rows = not tested)  Active ROM Right eval Left eval Left 03/02/23  Thumb MCP (0-60)  30 40  Thumb IP (0-80)  55 48  Thumb Radial  abd/add (0-55)      Thumb Palmar abd/add (0-45)      Thumb Opposition to Small Finger      Index MCP (0-90)      Index PIP (0-100)      Index DIP (0-70)       Long MCP (0-90)       Long PIP (0-100)       Long DIP (0-70)       Ring MCP (0-90)       Ring PIP (0-100)       Ring DIP (0-70)       Little MCP (0-90)       Little PIP (0-100)       Little DIP (0-70)       (Blank rows = not tested)  HAND FUNCTION: Not assessed due to restrictions s/p surgery   COORDINATION: Not assessed due to restrictions s/p surgery  SENSATION: Paresthesias with palpation at scar  EDEMA: swelling in [facility] joint, however per pt has decreased  COGNITION: Overall cognitive status: Within functional limits for tasks assessed    TODAY'S TREATMENT:                                                                                 03/14/23 Theraputty: Reviewed key pinch with putty with focus on CMC flexion.  OT instructed pt in thumb opposition with tan putty and thump MCP and IP flexion with putty.   Reviewed thumb adduction against putty.  OT providing demonstration and verbal cues for technique over putty as well as with putty between thumb and index finger, both to challenge adduction.   Wrist strengthening: OT instructed pt in wrist extension and radial deviation with 1# dumbbell.  OT providing min verbal cues for technique. Completed x10 wrist extension and radial deviation with forearm support on table top.  OT educated on use of dumbbell vs small water bottle to not over extend [facility] joint during ROM/strengthening.    03/09/23 Manual therapy: OT completing joint mobilizations at [facility] and MP thumb joint with focus on adduction, extension, and circumduction.  Pt reporting mild pain in thumb with mobilizations and pressure.  OT facilitating stretch and mobility in thenar eminence to facility increased thumb positioning and ability to increase flattening of hand. Therapeutic exercise: engaged in thumb abduction/adduction in "L" pattern on table top and pillow surface to allow for increased support and ROM.  Reviewed thumb exercises against mild resistance with use of foam tubing, pt to continue to utilize putty at home. Educated on current status in surgery recovery and typical results at 10 weeks s/p surgery.  Pt reports frustration still with decreased mobility of thumb, but appreciative of reassurance of current status in regards to recovery timeline.   03/07/23 Manual therapy: OT providing gentle massage around thumb throughout thenar eminence, webspace, and around MP joint.  Pt reports tenderness at MP joint.  OT then engaged in manual facilitation of thumb at MP joint with focus on flexion/extension and adduction towards index finger.  PROM: OT instructed pt in PROM at MP joint with focus on flexion/extension and adduction towards index finger.  Pt benefiting from demonstration and verbal cues to ensure proper  technique and positioning. Resistance: OT educated pt on use of mild resistance to  facilitate increased thumb adduction.  Utilized light resistance (tan) putty to facilitate increased adduction against mild resistance.  OT portioning putty in half to allow for increased success with movement.  OT instructed pt in key pinch with focus on maintaining IP, MP joints in extension with focus on ROM at [facility] joint against mild resistance.  OT then introduced abduction against self-anchored resistance with rubber band.  Pt demonstrating increased difficulty with rubber band, therefore encouraged pt to focus on use of theraputty and may attempt rubber band at future sessions.   PATIENT EDUCATION: Education details: Increased resistance exercises and focus on thumb adduction towards index finger. Person educated: Patient Education method: Explanation, Demonstration, Verbal cues, and Handouts Education comprehension: verbalized understanding and returned demonstration  HOME EXERCISE PROGRAM: Access Code: [email or web address] URL: [email or web address] Date: 03/14/2023 Prepared by: [facility]  Exercises - Seated Thumb IP Flexion AROM with Blocking  - 4-6 x daily - 1 sets - 10 reps - 3-5 sec hold - Wrist Extension AROM  - 4-6 x daily - 1 sets - 10 reps - 3-5 sec hold - Seated Wrist Radial and Ulnar Deviation AROM  - 4-6 x daily - 1 sets - 10 reps - 3-5 sec hold - Thumb AROM Opposition To All Fingers  - 4-6 x daily - 1 sets - 10 reps - Seated Thumb Composite Flexion AROM  - 4-6 x daily - 1 sets - 10 reps - Seated Thumb MP Flexion PROM  - 4-6 x daily - 1 sets - 10 reps - Thumb AROM Radial Abduction and Adduction  - 4-6 x daily - 1 sets - 10 reps - Thumb Abduction/Adduction Strengthening With Putty  - 4-6 x daily - 1 sets - 10 reps - Key Pinch with Putty  - 4-6 x daily - 1 sets - 10 reps - Thumb MCP and IP Flexion with Putty  - 4-6 x daily - 1 sets - 10 reps - Thumb Opposition with Putty  - 4-6 x daily - 1 sets - 10 reps - Thumb Flexion with  Resistance  - 4-6 x daily - 1 sets - 10 reps - Seated Wrist Extension with Dumbbell  - 2-3 x daily - 1 sets - 10 reps - 3 sec hold - Seated Wrist Radial Deviation with Dumbbell  - 2-3 x daily - 1 sets - 10 reps  GOALS: Goals reviewed with patient? Yes  SHORT TERM GOALS: Target date: 03/03/23  Pt will report decreased pain level by 50%. Baseline: Goal status: MET - 03/02/23  2.  Pt will improve composite thumb flexion by 20-30 degrees. Baseline: MCP 30, IP 55 Goal status: NOT MET - +8* on 03/02/23  3.  Pt will improve active wrist flexion by 15-20 degrees, and extension by 10-15 degrees. Baseline: flexion 56, extension 48 Goal status: NOT MET - +10* on 03/02/23  4.  Patient will be able to utilize Left hand as normal assist during ADLs. Baseline:  Goal status: NOT MET - significantly improved but still not utilizing as normal assist (particularly with pants up/down) 03/02/23   LONG TERM GOALS: Target date: 03/24/23  Pt will regain sufficient hand/wrist strength for home maintenance tasks, and leisure activities. Baseline:  Goal status: IN PROGRESS  2.  Patient will gradually return to her normal activity level without pain. Baseline:  Goal status: IN PROGRESS  3.  Pt will report improved  functional use of LUE as evidenced by improved score on Quick DASH to <35% impairment. Baseline: 56.8% Goal status: IN PROGRESS  ASSESSMENT:  CLINICAL IMPRESSION: Patient is 10 weeks, 5 days (Surgery 12/29/22) from Left thumb trapeziectomy with internal brace suspensioplasty and partial trapezoidectomy. Pt continues to c/o pain around [facility] joint due to tightness in APB with flexion and extension and decreased ability to get hand flat on table top.  OT providing education on rationale of stability of thumb and therefore decreased ability to achieve full flatness of hand.  Pt demonstrating carryover of education in regards to increased resistance with theraputty for pinch and grip and 1# dumbbell for wrist  strengthening and self-ROM exercises.    PERFORMANCE DEFICITS: in functional skills including ADLs, IADLs, coordination, dexterity, sensation, edema, ROM, strength, pain, flexibility, and UE functional use.  IMPAIRMENTS: are limiting patient from ADLs, IADLs, and leisure.   COMORBIDITIES: may have co-morbidities  that affects occupational performance. Patient will benefit from skilled OT to address above impairments and improve overall function.  MODIFICATION OR ASSISTANCE TO COMPLETE EVALUATION: No modification of tasks or assist necessary to complete an evaluation.  OT OCCUPATIONAL PROFILE AND HISTORY: Problem focused assessment: Including review of records relating to presenting problem.  CLINICAL DECISION MAKING: LOW - limited treatment options, no task modification necessary  REHAB POTENTIAL: Good  EVALUATION COMPLEXITY: Low      PLAN:  OT FREQUENCY: 1-2x/week (1x/week for 3 weeks, then progressing to 2x/week as allowed to take on increased force through UE)  OT DURATION: 6 weeks  PLANNED INTERVENTIONS: therapeutic exercise, neuromuscular re-education, manual therapy, scar mobilization, passive range of motion, splinting, moist heat, cryotherapy, and patient/family education  RECOMMENDED OTHER SERVICES: NA  CONSULTED AND AGREED WITH PLAN OF CARE: Patient  PLAN FOR NEXT SESSION: review HEP and add as applicable, focus on thumb adduction and increased stability against mild resistance.   Simonne Come, OT 03/14/2023, 1:25 PM

## 2023-03-16 ENCOUNTER — Ambulatory Visit: Payer: Medicare Other | Admitting: Occupational Therapy

## 2023-03-16 DIAGNOSIS — M6281 Muscle weakness (generalized): Secondary | ICD-10-CM | POA: Diagnosis not present

## 2023-03-16 DIAGNOSIS — M25542 Pain in joints of left hand: Secondary | ICD-10-CM

## 2023-03-16 NOTE — Patient Instructions (Signed)
  Coordination Activities  Perform the following activities for 10-15 minutes 3-4 times per day with left hand(s).  Flip cards 1 at a time.  Focus on thumb and hand placement.  (May need to move stack of cards away from body to get full forearm/wrist rotation.) Deal cards with your thumb (Hold deck in hand and push card off top with thumb). Turn pages in a book or magazine.  Focus on thumb and hand placement.

## 2023-03-16 NOTE — Therapy (Signed)
OUTPATIENT OCCUPATIONAL THERAPY ORTHO  Treatment Note  Patient Name: Wendy Joyce MRN: [id number] DOB:09-Dec-1946, 77 y.o., female Today's Date: 02/07/2023  PCP: [place], Blondell Reveal, MD REFERRING PROVIDER: Sherilyn Cooter, MD  END OF SESSION:  OT End of Session - 03/16/23 1410     Visit Number 7    Number of Visits 12    Date for OT Re-Evaluation 03/24/23    Authorization Type [employer]BS [place]    OT Start Time [id number]    OT Stop Time [id number]    OT Time Calculation (min) 40 min                 Past Medical History:  Diagnosis Date   Abdominal pain, generalized 01/29/2015   Abnormal thyroid function test 02/25/2016   ADHD (attention deficit hyperactivity disorder)    Allergic rhinitis, unspecified 01/29/2015   Ankle sprain 03/16/2016   Anxiety 01/29/2015   Arthritis    hands   Chronic kidney disease 2012   kidney stones   Constipation 01/29/2015   Decreased libido 05/02/2018   Disorder of the skin and subcutaneous tissue, unspecified 01/29/2015   DJD (degenerative joint disease)    hand   Dysthymia    Endometriosis    External otitis    Family history of coronary artery disease 05/02/2018   Fatigue due to excessive exertion 02/24/2016   Fever blister    Fibromyalgia 01/29/2015   Frequency of micturition 01/29/2015   Functional dyspepsia 01/29/2015   Gastro-esophageal reflux disease without esophagitis 01/29/2015   Headache(784.0)    High cholesterol    Hyperlipidemia 09/12/2016   Hypersomnia    Hypothyroidism    IBS (irritable bowel syndrome) 05/02/2018   Insomnia    Internal hemorrhoids    Irritable 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 01/29/2015   Labial pain 11/30/2016   Lactose intolerance 12/14/2016   Lactose intolerance in adult 09/12/2016   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   Left ureteral stone    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   Major depressive disorder, single episode, unspecified    Migraine with aura, not intractable, without status  migrainosus    Mixed hyperlipidemia    Mouth pain 01/07/2016   Osteoarthritis of hip    Osteopenia determined by x-ray 03/02/2017   Personal history of estrogen therapy    Phlebitis and thrombophlebitis of the leg    Pityriasis alba 01/29/2015   Pure hypercholesterolemia 06/29/2016   Rectum pain 09/12/2016   Recurrent sinus infections 02/24/2016   Rhinosinusitis 01/07/2016   Rosacea    Sinusitis 12/24/2015   Tinnitus of left ear [id number]   Umbilical hernia without obstruction and without gangrene [id number]   Umbilical pain [id number]   Unspecified urinary incontinence 01/07/2016   Urticaria    UTI (urinary tract infection)    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   Vitamin D deficiency    Past Surgical History:  Procedure Laterality Date   BREAST ENHANCEMENT SURGERY     BREAST REDUCTION SURGERY Bilateral 2018   COLONOSCOPY WITH PROPOFOL N/A 02/25/2013   Procedure: COLONOSCOPY WITH PROPOFOL;  Surgeon: Garlan Fair, MD;  Location: [facility];  Service: Endoscopy;  Laterality: N/A;   mini facelift  2000   NASAL SINUS SURGERY     REDUCTION MAMMAPLASTY     ROTATOR CUFF REPAIR     Right   ROTATOR CUFF REPAIR     Left   SEPTOPLASTY WITH ETHMOIDECTOMY, AND MAXILLARY ANTROSTOMY Bilateral 2006   TOTAL ABDOMINAL HYSTERECTOMY  ovaries remain   TUBAL LIGATION     umbicial hernia     Patient Active Problem List   Diagnosis Date Noted   Depression, recurrent ([place]) 12/25/2022   Preoperative clearance 11/23/2022   Encounter for counseling 10/14/2022   Influenza 09/18/2022   Pain of left hand 06/29/2022   Vasomotor symptoms due to menopause 05/30/2022   Pre-diabetes 05/30/2022   Anxiety disorder 03/17/2022   Allergic rhinitis 03/17/2022   Constipation 03/17/2022   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   Exertional dyspnea 03/17/2022   Family history of coronary artery disease 03/17/2022   Weakness with dizziness 03/17/2022   Fever  blister 03/17/2022   Frequency of micturition 03/17/2022   Irritable bowel syndrome 03/17/2022   Lactose intolerance 03/17/2022   Left anterior fascicular block [id number]   Lichen planus 123456   Panniculitis 03/17/2022   Refractory migraine with aura 03/17/2022   Statin not tolerated 03/17/2022   Thrombophlebitis of superficial veins of lower extremity 03/17/2022   Bilateral tinnitus 123456   Umbilical hernia 123456   Urinary incontinence 03/17/2022   Urticaria due to drug allergy 03/17/2022   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 Pt accompanied by: self and significant other (dropped her off)  PERTINENT HISTORY: HTN, hyperlipidemia, Vitamin D deficiency, Pre-diabetes  PRECAUTIONS: Other: no lifting, wearing splint "most of the time" and remove for exercises  WEIGHT BEARING RESTRICTIONS: Yes no lifting  PAIN:  Are you having pain? No  FALLS: Has patient fallen in last 6 months? No  LIVING ENVIRONMENT: Lives with: lives with their spouse Lives in: House/apartment Stairs: Yes: Internal: art studios is up full flight of steps and External: 2 steps; none Has following equipment at home: None  PLOF: Independent  PATIENT GOALS: to be able to use my hand as well as I could before  NEXT MD VISIT: 3/11//24  OBJECTIVE:   HAND DOMINANCE: Right  ADLs: Overall ADLs: Currently wearing gown and  robe and/or legging style pants primarily due to difficulty with managing pants with button/zipper and not wanting help  FUNCTIONAL OUTCOME MEASURES: Quick Dash: 56.8% disability/symptom score  UPPER EXTREMITY ROM:     Active ROM Right eval Left eval Left 03/02/23  Shoulder flexion     Shoulder abduction     Shoulder adduction     Shoulder extension     Shoulder internal rotation     Shoulder external rotation     Elbow flexion     Elbow extension     Wrist flexion  56 60  Wrist extension  48 54  Wrist ulnar deviation  28 26  Wrist radial deviation  10 16  Wrist pronation  87 90  Wrist supination  86 86  (Blank rows = not tested)  Active ROM Right eval Left eval Left 03/02/23  Thumb MCP (0-60)  30 40  Thumb IP (0-80)  55 48  Thumb Radial abd/add (0-55)      Thumb Palmar abd/add (0-45)      Thumb Opposition to  Small Finger      Index MCP (0-90)      Index PIP (0-100)      Index DIP (0-70)       Long MCP (0-90)       Long PIP (0-100)       Long DIP (0-70)       Ring MCP (0-90)       Ring PIP (0-100)       Ring DIP (0-70)       Little MCP (0-90)       Little PIP (0-100)       Little DIP (0-70)       (Blank rows = not tested)  HAND FUNCTION: Not assessed due to restrictions s/p surgery   COORDINATION: Not assessed due to restrictions s/p surgery  SENSATION: Paresthesias with palpation at scar  EDEMA: swelling in [facility] joint, however per pt has decreased  COGNITION: Overall cognitive status: Within functional limits for tasks assessed    TODAY'S TREATMENT:                                                                                 03/16/23 Therapeutic activity: engaged in flipping cards and turning pages in magazine with focus on thumb adduction and flexion.  Pt continues to demonstrate decreased proximity of thumb to index finger. Manual therapy: OT completing joint mobilizations at [facility] and MP thumb joint with focus on adduction, extension, and  circumduction.  Pt reporting mild pain in thumb with mobilizations and pressure.  OT facilitating stretch and mobility in thenar eminence to facility increased thumb positioning and ability to increase flattening of hand.   03/14/23 Theraputty: Reviewed key pinch with putty with focus on CMC flexion.  OT instructed pt in thumb opposition with tan putty and thump MCP and IP flexion with putty.  Reviewed thumb adduction against putty.  OT providing demonstration and verbal cues for technique over putty as well as with putty between thumb and index finger, both to challenge adduction.   Wrist strengthening: OT instructed pt in wrist extension and radial deviation with 1# dumbbell.  OT providing min verbal cues for technique. Completed x10 wrist extension and radial deviation with forearm support on table top.  OT educated on use of dumbbell vs small water bottle to not over extend [facility] joint during ROM/strengthening.    03/09/23 Manual therapy: OT completing joint mobilizations at [facility] and MP thumb joint with focus on adduction, extension, and circumduction.  Pt reporting mild pain in thumb with mobilizations and pressure.  OT facilitating stretch and mobility in thenar eminence to facility increased thumb positioning and ability to increase flattening of hand. Therapeutic exercise: engaged in thumb abduction/adduction in "L" pattern on table top and pillow surface to allow for increased support and ROM.  Reviewed thumb exercises against mild resistance with use of foam tubing, pt to continue to utilize putty at home. Educated on current status in surgery recovery and typical results at 10 weeks s/p surgery.  Pt reports frustration still with decreased mobility of thumb, but appreciative of reassurance of current status in regards to recovery timeline.    PATIENT EDUCATION: Education details: Increased resistance exercises and focus  on thumb adduction towards index finger. Person educated:  Patient Education method: Explanation, Demonstration, Verbal cues, and Handouts Education comprehension: verbalized understanding and returned demonstration  HOME EXERCISE PROGRAM: Access Code: [email or web address] URL: [email or web address] Date: 03/14/2023 Prepared by: [facility]  Exercises - Seated Thumb IP Flexion AROM with Blocking  - 4-6 x daily - 1 sets - 10 reps - 3-5 sec hold - Wrist Extension AROM  - 4-6 x daily - 1 sets - 10 reps - 3-5 sec hold - Seated Wrist Radial and Ulnar Deviation AROM  - 4-6 x daily - 1 sets - 10 reps - 3-5 sec hold - Thumb AROM Opposition To All Fingers  - 4-6 x daily - 1 sets - 10 reps - Seated Thumb Composite Flexion AROM  - 4-6 x daily - 1 sets - 10 reps - Seated Thumb MP Flexion PROM  - 4-6 x daily - 1 sets - 10 reps - Thumb AROM Radial Abduction and Adduction  - 4-6 x daily - 1 sets - 10 reps - Thumb Abduction/Adduction Strengthening With Putty  - 4-6 x daily - 1 sets - 10 reps - Key Pinch with Putty  - 4-6 x daily - 1 sets - 10 reps - Thumb MCP and IP Flexion with Putty  - 4-6 x daily - 1 sets - 10 reps - Thumb Opposition with Putty  - 4-6 x daily - 1 sets - 10 reps - Thumb Flexion with Resistance  - 4-6 x daily - 1 sets - 10 reps - Seated Wrist Extension with Dumbbell  - 2-3 x daily - 1 sets - 10 reps - 3 sec hold - Seated Wrist Radial Deviation with Dumbbell  - 2-3 x daily - 1 sets - 10 reps  GOALS: Goals reviewed with patient? Yes  SHORT TERM GOALS: Target date: 03/03/23  Pt will report decreased pain level by 50%. Baseline: Goal status: MET - 03/02/23  2.  Pt will improve composite thumb flexion by 20-30 degrees. Baseline: MCP 30, IP 55 Goal status: NOT MET - +8* on 03/02/23  3.  Pt will improve active wrist flexion by 15-20 degrees, and extension by 10-15 degrees. Baseline: flexion 56, extension 48 Goal status: NOT MET - +10* on 03/02/23  4.  Patient will be able to utilize Left hand as normal  assist during ADLs. Baseline:  Goal status: NOT MET - significantly improved but still not utilizing as normal assist (particularly with pants up/down) 03/02/23   LONG TERM GOALS: Target date: 03/24/23  Pt will regain sufficient hand/wrist strength for home maintenance tasks, and leisure activities. Baseline:  Goal status: IN PROGRESS  2.  Patient will gradually return to her normal activity level without pain. Baseline:  Goal status: IN PROGRESS  3.  Pt will report improved functional use of LUE as evidenced by improved score on Quick DASH to <35% impairment. Baseline: 56.8% Goal status: IN PROGRESS  ASSESSMENT:  CLINICAL IMPRESSION: Patient is 11 weeks (Surgery 12/29/22) from Left thumb trapeziectomy with internal brace suspensioplasty and partial trapezoidectomy. Pt continues to c/o pain around [facility] joint due to tightness in APB with flexion and extension and decreased ability to get hand flat on table top.  OT providing education on rationale of stability of thumb and therefore decreased ability to achieve full flatness of hand.  Pt asking questions about thumb positioning in hand orthosis and probability of different style of orthosis to allow for further adduction vs focus on ROM  and resistance exercises.   PERFORMANCE DEFICITS: in functional skills including ADLs, IADLs, coordination, dexterity, sensation, edema, ROM, strength, pain, flexibility, and UE functional use.  IMPAIRMENTS: are limiting patient from ADLs, IADLs, and leisure.   COMORBIDITIES: may have co-morbidities  that affects occupational performance. Patient will benefit from skilled OT to address above impairments and improve overall function.  MODIFICATION OR ASSISTANCE TO COMPLETE EVALUATION: No modification of tasks or assist necessary to complete an evaluation.  OT OCCUPATIONAL PROFILE AND HISTORY: Problem focused assessment: Including review of records relating to presenting problem.  CLINICAL DECISION MAKING:  LOW - limited treatment options, no task modification necessary  REHAB POTENTIAL: Good  EVALUATION COMPLEXITY: Low      PLAN:  OT FREQUENCY: 1-2x/week (1x/week for 3 weeks, then progressing to 2x/week as allowed to take on increased force through UE)  OT DURATION: 6 weeks  PLANNED INTERVENTIONS: therapeutic exercise, neuromuscular re-education, manual therapy, scar mobilization, passive range of motion, splinting, moist heat, cryotherapy, and patient/family education  RECOMMENDED OTHER SERVICES: NA  CONSULTED AND AGREED WITH PLAN OF CARE: Patient  PLAN FOR NEXT SESSION: review HEP and add as applicable, focus on thumb adduction and increased stability against mild resistance. ?modified orthosis   Philomene Haff, [place], OT 03/16/2023, 2:13 PM

## 2023-03-18 ENCOUNTER — Other Ambulatory Visit (HOSPITAL_BASED_OUTPATIENT_CLINIC_OR_DEPARTMENT_OTHER): Payer: Self-pay | Admitting: Nurse Practitioner

## 2023-03-18 DIAGNOSIS — E038 Other specified hypothyroidism: Secondary | ICD-10-CM

## 2023-03-18 DIAGNOSIS — E063 Autoimmune thyroiditis: Secondary | ICD-10-CM

## 2023-03-20 ENCOUNTER — Ambulatory Visit (INDEPENDENT_AMBULATORY_CARE_PROVIDER_SITE_OTHER): Payer: Self-pay | Admitting: Surgical

## 2023-03-20 ENCOUNTER — Other Ambulatory Visit (HOSPITAL_BASED_OUTPATIENT_CLINIC_OR_DEPARTMENT_OTHER): Payer: Self-pay

## 2023-03-20 DIAGNOSIS — Z719 Counseling, unspecified: Secondary | ICD-10-CM

## 2023-03-20 MED ORDER — LEVOTHYROXINE SODIUM 112 MCG PO TABS
112.0000 ug | ORAL_TABLET | Freq: Every day | ORAL | 1 refills | Status: DC
  Filled 2023-03-20: qty 90, 90d supply, fill #0
  Filled 2023-06-16: qty 90, 90d supply, fill #1

## 2023-03-20 NOTE — Progress Notes (Signed)
Botulinum Toxin Procedure Note  Procedure: Cosmetic botulinum toxin  Pre-operative Diagnosis: Dynamic rhytides  Post-operative Diagnosis: Same  Complications:  None  Brief history: The patient desires botulinum toxin injection.  She is aware of the risks including bleeding, damage to deeper structures, asymmetry, brow ptosis, eyelid ptosis, bruising. The patient understands and wishes to proceed.  Procedure: The area was prepped with alcohol and dried with a clean gauze.  Using a clean technique the botulinum toxin was diluted with 2.5 mL of bacteriostatic saline per 100 unit vial which resulted in 4 units per 0.1 mL.  Subsequently the mixture was injected in the glabellar, lateral canthal lines, forehead area with preservation of the temporal branch to the lateral eyebrow. A total of 30 Units of botulinum toxin was used. The forehead and glabellar area was injected with care to inject intramuscular only while holding pressure on the supratrochlear vessels in each area during each injection on either side of the medial corrugators. The injection proceeded vertically superiorly to the medial 2/3 of the frontalis muscle and superior 2/3 of the lateral frontalis, again with preservation of the frontal branch.  No complications were noted. Light pressure was held for 5 minutes. She was instructed explicitly in post-operative care.  We injected the forehead in a standard M shape pattern We injected two locations in the lateral canthus, one spot lateral, one spot superior to this. The glabella was injected in a standard pattern  Botox LOT:  [id number] EXP:  2026/03

## 2023-03-21 ENCOUNTER — Ambulatory Visit: Payer: Medicare Other | Admitting: Occupational Therapy

## 2023-03-21 DIAGNOSIS — R69 Illness, unspecified: Secondary | ICD-10-CM | POA: Diagnosis not present

## 2023-03-21 DIAGNOSIS — M25542 Pain in joints of left hand: Secondary | ICD-10-CM

## 2023-03-21 DIAGNOSIS — M6281 Muscle weakness (generalized): Secondary | ICD-10-CM | POA: Diagnosis not present

## 2023-03-21 NOTE — Therapy (Signed)
OUTPATIENT OCCUPATIONAL THERAPY ORTHO  Treatment Note  Patient Name: Wendy Joyce MRN: [id number] DOB:1946/05/23, 77 y.o., female Today's Date: 02/07/2023  PCP: [place], Blondell Reveal, MD REFERRING PROVIDER: Sherilyn Cooter, MD  END OF SESSION:  OT End of Session - 03/21/23 1500     Visit Number 8    Number of Visits 12    Date for OT Re-Evaluation 03/24/23    Authorization Type [employer] [place]    OT Start Time 1405    OT Stop Time 1440    OT Time Calculation (min) 35 min                  Past Medical History:  Diagnosis Date   Abdominal pain, generalized 01/29/2015   Abnormal thyroid function test 02/25/2016   ADHD (attention deficit hyperactivity disorder)    Allergic rhinitis, unspecified 01/29/2015   Ankle sprain 03/16/2016   Anxiety 01/29/2015   Arthritis    hands   Chronic kidney disease 2012   kidney stones   Constipation 01/29/2015   Decreased libido 05/02/2018   Disorder of the skin and subcutaneous tissue, unspecified 01/29/2015   DJD (degenerative joint disease)    hand   Dysthymia    Endometriosis    External otitis    Family history of coronary artery disease 05/02/2018   Fatigue due to excessive exertion 02/24/2016   Fever blister    Fibromyalgia 01/29/2015   Frequency of micturition 01/29/2015   Functional dyspepsia 01/29/2015   Gastro-esophageal reflux disease without esophagitis 01/29/2015   Headache(784.0)    High cholesterol    Hyperlipidemia 09/12/2016   Hypersomnia    Hypothyroidism    IBS (irritable bowel syndrome) 05/02/2018   Insomnia    Internal hemorrhoids    Irritable 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 01/29/2015   Labial pain 11/30/2016   Lactose intolerance 12/14/2016   Lactose intolerance in adult 09/12/2016   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   Left ureteral stone    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   Major depressive disorder, single episode, unspecified    Migraine with aura, not intractable, without status  migrainosus    Mixed hyperlipidemia    Mouth pain 01/07/2016   Osteoarthritis of hip    Osteopenia determined by x-ray 03/02/2017   Personal history of estrogen therapy    Phlebitis and thrombophlebitis of the leg    Pityriasis alba 01/29/2015   Pure hypercholesterolemia 06/29/2016   Rectum pain 09/12/2016   Recurrent sinus infections 02/24/2016   Rhinosinusitis 01/07/2016   Rosacea    Sinusitis 12/24/2015   Tinnitus of left ear 123XX123   Umbilical hernia without obstruction and without gangrene 123XX123   Umbilical pain [id number]   Unspecified urinary incontinence 01/07/2016   Urticaria    UTI (urinary tract infection)    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   Vitamin D deficiency    Past Surgical History:  Procedure Laterality Date   BREAST ENHANCEMENT SURGERY     BREAST REDUCTION SURGERY Bilateral 2018   COLONOSCOPY WITH PROPOFOL N/A 02/25/2013   Procedure: COLONOSCOPY WITH PROPOFOL;  Surgeon: Garlan Fair, MD;  Location: [facility];  Service: Endoscopy;  Laterality: N/A;   mini facelift  2000   NASAL SINUS SURGERY     REDUCTION MAMMAPLASTY     ROTATOR CUFF REPAIR     Right   ROTATOR CUFF REPAIR     Left   SEPTOPLASTY WITH ETHMOIDECTOMY, AND MAXILLARY ANTROSTOMY Bilateral 2006   TOTAL ABDOMINAL HYSTERECTOMY  ovaries remain   TUBAL LIGATION     umbicial hernia     Patient Active Problem List   Diagnosis Date Noted   Depression, recurrent ([place]) 12/25/2022   Preoperative clearance 11/23/2022   Encounter for counseling 10/14/2022   Influenza 09/18/2022   Pain of left hand 06/29/2022   Vasomotor symptoms due to menopause 05/30/2022   Pre-diabetes 05/30/2022   Anxiety disorder 03/17/2022   Allergic rhinitis 03/17/2022   Constipation 03/17/2022   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   Exertional dyspnea 03/17/2022   Family history of coronary artery disease 03/17/2022   Weakness with dizziness 03/17/2022   Fever  blister 03/17/2022   Frequency of micturition 03/17/2022   Irritable bowel syndrome 03/17/2022   Lactose intolerance 03/17/2022   Left anterior fascicular block 123456   Lichen planus 123456   Panniculitis 03/17/2022   Refractory migraine with aura 03/17/2022   Statin not tolerated 03/17/2022   Thrombophlebitis of superficial veins of lower extremity 03/17/2022   Bilateral tinnitus 123456   Umbilical hernia 123456   Urinary incontinence 03/17/2022   Urticaria due to drug allergy 03/17/2022   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 Pt accompanied by: self and significant other (dropped her off)  PERTINENT HISTORY: HTN, hyperlipidemia, Vitamin D deficiency, Pre-diabetes  PRECAUTIONS: Other: no lifting, wearing splint "most of the time" and remove for exercises  WEIGHT BEARING RESTRICTIONS: Yes no lifting  PAIN:  Are you having pain? Yes: NPRS scale: 1/10 Pain location: wrist Pain description: sore Aggravating factors: none stated Relieving factors: repositioning  FALLS: Has patient fallen in last 6 months? No  LIVING ENVIRONMENT: Lives with: lives with their spouse Lives in: House/apartment Stairs: Yes: Internal: art studios is up full flight of steps and External: 2 steps; none Has following equipment at home: None  PLOF: Independent  PATIENT GOALS: to be able to use my  hand as well as I could before  NEXT MD VISIT: 3/11//24  OBJECTIVE:   HAND DOMINANCE: Right  ADLs: Overall ADLs: Currently wearing gown and robe and/or legging style pants primarily due to difficulty with managing pants with button/zipper and not wanting help  FUNCTIONAL OUTCOME MEASURES: Quick Dash: 56.8% disability/symptom score  UPPER EXTREMITY ROM:     Active ROM Right eval Left eval Left 03/02/23  Shoulder flexion     Shoulder abduction     Shoulder adduction     Shoulder extension     Shoulder internal rotation     Shoulder external rotation     Elbow flexion     Elbow extension     Wrist flexion  56 60  Wrist extension  48 54  Wrist ulnar deviation  28 26  Wrist radial deviation  10 16  Wrist pronation  87 90  Wrist supination  86 86  (Blank rows = not tested)  Active ROM Right eval Left eval Left 03/02/23  Thumb MCP (0-60)  30 40  Thumb IP (0-80)  55 48  Thumb Radial  abd/add (0-55)      Thumb Palmar abd/add (0-45)      Thumb Opposition to Small Finger      Index MCP (0-90)      Index PIP (0-100)      Index DIP (0-70)       Long MCP (0-90)       Long PIP (0-100)       Long DIP (0-70)       Ring MCP (0-90)       Ring PIP (0-100)       Ring DIP (0-70)       Little MCP (0-90)       Little PIP (0-100)       Little DIP (0-70)       (Blank rows = not tested)  HAND FUNCTION: 03/21/23 Grip strength: Right: 39 lbs; Left: 14 lbs, 3 point pinch: Right: 12 lbs, Left: 5 lbs, and Tip pinch: Right 12 lbs, Left: 6 lbs  COORDINATION: Not assessed due to restrictions s/p surgery  SENSATION: Paresthesias with palpation at scar  EDEMA: swelling in [facility] joint, however per pt has decreased  COGNITION: Overall cognitive status: Within functional limits for tasks assessed    TODAY'S TREATMENT:                                                                                 03/21/23 Manual therapy: OT completing joint mobilizations and facilitating stretch in thenar  eminence to facility increased thumb positioning and mobility.  Pt reporting no pain with massage and mobilizations this session. Grip strength: Right: 39 lbs; Left: 14 lbs, 3 point pinch: Right: 12 lbs, Left: 5 lbs, and Tip pinch: Right 12 lbs, Left: 6 lbs. Therapeutic exercise: OT instructed pt with demonstration and verbal cues in Seated Wrist Flexion Stretch, Wrist Prayer Stretch, Stretch Thumb DOWNWARD, Thumb Webspace Stretch, and Towel Roll Grip with Forearm in Neutral.  Pt reports mild pain with wrist flexion and demonstrating difficulty with wrist extension on L during prayer stretch.      03/16/23 Therapeutic activity: engaged in flipping cards and turning pages in magazine with focus on thumb adduction and flexion.  Pt continues to demonstrate decreased proximity of thumb to index finger. Manual therapy: OT completing joint mobilizations at [facility] and MP thumb joint with focus on adduction, extension, and circumduction.  Pt reporting mild pain in thumb with mobilizations and pressure.  OT facilitating stretch and mobility in thenar eminence to facility increased thumb positioning and ability to increase flattening of hand.   03/14/23 Theraputty: Reviewed key pinch with putty with focus on CMC flexion.  OT instructed pt in thumb opposition with tan putty and thump MCP and IP flexion with putty.  Reviewed thumb adduction against putty.  OT providing demonstration and verbal cues for technique over putty as well as with putty between thumb and index finger, both to challenge adduction.   Wrist strengthening: OT instructed pt in wrist extension and radial deviation with 1# dumbbell.  OT providing min verbal cues for technique. Completed x10 wrist extension and radial deviation with forearm support on table top.  OT educated on use of dumbbell vs small water bottle to not over  extend [facility] joint during ROM/strengthening.   PATIENT EDUCATION: Education details: Increased focus on stable CMC joint and  massage Person educated: Patient Education method: Explanation, Demonstration, Verbal cues, and Handouts Education comprehension: verbalized understanding and returned demonstration  HOME EXERCISE PROGRAM: Access Code: [email or web address] URL: [email or web address] Date: 03/14/2023 Prepared by: [facility]  Exercises - Seated Thumb IP Flexion AROM with Blocking  - 4-6 x daily - 1 sets - 10 reps - 3-5 sec hold - Wrist Extension AROM  - 4-6 x daily - 1 sets - 10 reps - 3-5 sec hold - Seated Wrist Radial and Ulnar Deviation AROM  - 4-6 x daily - 1 sets - 10 reps - 3-5 sec hold - Thumb AROM Opposition To All Fingers  - 4-6 x daily - 1 sets - 10 reps - Seated Thumb Composite Flexion AROM  - 4-6 x daily - 1 sets - 10 reps - Seated Thumb MP Flexion PROM  - 4-6 x daily - 1 sets - 10 reps - Thumb AROM Radial Abduction and Adduction  - 4-6 x daily - 1 sets - 10 reps - Thumb Abduction/Adduction Strengthening With Putty  - 4-6 x daily - 1 sets - 10 reps - Key Pinch with Putty  - 4-6 x daily - 1 sets - 10 reps - Thumb MCP and IP Flexion with Putty  - 4-6 x daily - 1 sets - 10 reps - Thumb Opposition with Putty  - 4-6 x daily - 1 sets - 10 reps - Thumb Flexion with Resistance  - 4-6 x daily - 1 sets - 10 reps - Seated Wrist Extension with Dumbbell  - 2-3 x daily - 1 sets - 10 reps - 3 sec hold - Seated Wrist Radial Deviation with Dumbbell  - 2-3 x daily - 1 sets - 10 reps  Access Code: [email or web address] URL: [email or web address] Date: 03/21/2023 Prepared by: [facility]  Exercises - Seated Wrist Flexion Stretch  - 2 x daily - 3 reps - 15 hold - Wrist Prayer Stretch  - 2 x daily - 3 reps - 15 sec hold - Stretch Thumb DOWNWARD  - 2 x daily - 3 reps - 15 sec hold - Thumb Webspace Stretch  - 2 x daily - 3 reps - 15 sec hold - Towel Roll Grip with Forearm in Neutral  - 2 x daily - 5 reps - 10 sec  hold   GOALS: Goals reviewed with patient? Yes  SHORT TERM GOALS: Target date: 03/03/23  Pt will report decreased pain level by 50%. Baseline: Goal status: MET - 03/02/23  2.  Pt will improve composite thumb flexion by 20-30 degrees. Baseline: MCP 30, IP 55 Goal status: NOT MET - +8* on 03/02/23  3.  Pt will improve active wrist flexion by 15-20 degrees, and extension by 10-15 degrees. Baseline: flexion 56, extension 48 Goal status: NOT MET - +10* on 03/02/23  4.  Patient will be able to utilize Left hand as normal assist during ADLs. Baseline:  Goal status: NOT MET - significantly improved but still not utilizing as normal assist (particularly with pants up/down) 03/02/23   LONG TERM GOALS: Target date: 03/24/23  Pt will regain sufficient hand/wrist strength for home maintenance tasks, and leisure activities. Baseline:  Goal status: IN PROGRESS  2.  Patient will gradually return to her normal activity level without pain. Baseline:  Goal status: IN PROGRESS  3.  Pt  will report improved functional use of LUE as evidenced by improved score on Quick DASH to <35% impairment. Baseline: 56.8% Goal status: IN PROGRESS  ASSESSMENT:  CLINICAL IMPRESSION: Patient is 11 weeks, 5 days (Surgery 12/29/22) from Left thumb trapeziectomy with internal brace suspensioplasty and partial trapezoidectomy. Pt continues to c/o pain around [facility] joint and in wrist with increased flexion and extension.  OT providing education on rationale of stability of thumb and therefore decreased ability to achieve full flatness of hand as well as expectation of recovery at this point with goal to be about 70-80% of function and much improved pain.  OT introduced wrist flexion/extension and CMC joint stabilization exercises with focus on quality of movement.  Pt encouraged to focus on updated HEP and terminate previous exercises at this time.  Pt demonstrating 50% pinch and grip in L hand compared to R.  PERFORMANCE  DEFICITS: in functional skills including ADLs, IADLs, coordination, dexterity, sensation, edema, ROM, strength, pain, flexibility, and UE functional use.  IMPAIRMENTS: are limiting patient from ADLs, IADLs, and leisure.   COMORBIDITIES: may have co-morbidities  that affects occupational performance. Patient will benefit from skilled OT to address above impairments and improve overall function.  MODIFICATION OR ASSISTANCE TO COMPLETE EVALUATION: No modification of tasks or assist necessary to complete an evaluation.  OT OCCUPATIONAL PROFILE AND HISTORY: Problem focused assessment: Including review of records relating to presenting problem.  CLINICAL DECISION MAKING: LOW - limited treatment options, no task modification necessary  REHAB POTENTIAL: Good  EVALUATION COMPLEXITY: Low      PLAN:  OT FREQUENCY: 1-2x/week (1x/week for 3 weeks, then progressing to 2x/week as allowed to take on increased force through UE)  OT DURATION: 6 weeks  PLANNED INTERVENTIONS: therapeutic exercise, neuromuscular re-education, manual therapy, scar mobilization, passive range of motion, splinting, moist heat, cryotherapy, and patient/family education  RECOMMENDED OTHER SERVICES: NA  CONSULTED AND AGREED WITH PLAN OF CARE: Patient  PLAN FOR NEXT SESSION: review HEP and add as applicable, focus on thumb adduction and increased stability against mild resistance. ?modified orthosis   Hashir Deleeuw, [place], OT 03/21/2023, 3:01 PM

## 2023-03-23 ENCOUNTER — Ambulatory Visit: Payer: Medicare Other | Admitting: Occupational Therapy

## 2023-03-23 DIAGNOSIS — M6281 Muscle weakness (generalized): Secondary | ICD-10-CM | POA: Diagnosis not present

## 2023-03-23 DIAGNOSIS — M25542 Pain in joints of left hand: Secondary | ICD-10-CM

## 2023-03-23 NOTE — Therapy (Signed)
OUTPATIENT OCCUPATIONAL THERAPY ORTHO  Treatment Note  Patient Name: Wendy Joyce MRN: [id number] DOB:Jul 23, 1946, 77 y.o., female Today's Date: 02/07/2023  PCP: [place], Blondell Reveal, MD REFERRING PROVIDER: Sherilyn Cooter, MD  END OF SESSION:  OT End of Session - 03/23/23 1501     Visit Number 9    Number of Visits 1213    Date for OT Re-Evaluation 04/21/23    Authorization Type [employer] [place]    OT Start Time [id number]    OT Stop Time 1446    OT Time Calculation (min) 44 min                   Past Medical History:  Diagnosis Date   Abdominal pain, generalized 01/29/2015   Abnormal thyroid function test 02/25/2016   ADHD (attention deficit hyperactivity disorder)    Allergic rhinitis, unspecified 01/29/2015   Ankle sprain 03/16/2016   Anxiety 01/29/2015   Arthritis    hands   Chronic kidney disease 2012   kidney stones   Constipation 01/29/2015   Decreased libido 05/02/2018   Disorder of the skin and subcutaneous tissue, unspecified 01/29/2015   DJD (degenerative joint disease)    hand   Dysthymia    Endometriosis    External otitis    Family history of coronary artery disease 05/02/2018   Fatigue due to excessive exertion 02/24/2016   Fever blister    Fibromyalgia 01/29/2015   Frequency of micturition 01/29/2015   Functional dyspepsia 01/29/2015   Gastro-esophageal reflux disease without esophagitis 01/29/2015   Headache(784.0)    High cholesterol    Hyperlipidemia 09/12/2016   Hypersomnia    Hypothyroidism    IBS (irritable bowel syndrome) 05/02/2018   Insomnia    Internal hemorrhoids    Irritable 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 01/29/2015   Labial pain 11/30/2016   Lactose intolerance 12/14/2016   Lactose intolerance in adult 09/12/2016   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   Left ureteral stone    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   Major depressive disorder, single episode, unspecified    Migraine with aura, not intractable, without status  migrainosus    Mixed hyperlipidemia    Mouth pain 01/07/2016   Osteoarthritis of hip    Osteopenia determined by x-ray 03/02/2017   Personal history of estrogen therapy    Phlebitis and thrombophlebitis of the leg    Pityriasis alba 01/29/2015   Pure hypercholesterolemia 06/29/2016   Rectum pain 09/12/2016   Recurrent sinus infections 02/24/2016   Rhinosinusitis 01/07/2016   Rosacea    Sinusitis 12/24/2015   Tinnitus of left ear 123XX123   Umbilical hernia without obstruction and without gangrene [id number]   Umbilical pain [id number]   Unspecified urinary incontinence 01/07/2016   Urticaria    UTI (urinary tract infection)    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   Vitamin D deficiency    Past Surgical History:  Procedure Laterality Date   BREAST ENHANCEMENT SURGERY     BREAST REDUCTION SURGERY Bilateral 2018   COLONOSCOPY WITH PROPOFOL N/A 02/25/2013   Procedure: COLONOSCOPY WITH PROPOFOL;  Surgeon: Garlan Fair, MD;  Location: [facility];  Service: Endoscopy;  Laterality: N/A;   mini facelift  2000   NASAL SINUS SURGERY     REDUCTION MAMMAPLASTY     ROTATOR CUFF REPAIR     Right   ROTATOR CUFF REPAIR     Left   SEPTOPLASTY WITH ETHMOIDECTOMY, AND MAXILLARY ANTROSTOMY Bilateral 2006   TOTAL ABDOMINAL HYSTERECTOMY  ovaries remain   TUBAL LIGATION     umbicial hernia     Patient Active Problem List   Diagnosis Date Noted   Depression, recurrent ([place]) 12/25/2022   Preoperative clearance 11/23/2022   Encounter for counseling 10/14/2022   Influenza 09/18/2022   Pain of left hand 06/29/2022   Vasomotor symptoms due to menopause 05/30/2022   Pre-diabetes 05/30/2022   Anxiety disorder 03/17/2022   Allergic rhinitis 03/17/2022   Constipation 03/17/2022   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   Exertional dyspnea 03/17/2022   Family history of coronary artery disease 03/17/2022   Weakness with dizziness 03/17/2022   Fever  blister 03/17/2022   Frequency of micturition 03/17/2022   Irritable bowel syndrome 03/17/2022   Lactose intolerance 03/17/2022   Left anterior fascicular block [id number]   Lichen planus 123456   Panniculitis 03/17/2022   Refractory migraine with aura 03/17/2022   Statin not tolerated 03/17/2022   Thrombophlebitis of superficial veins of lower extremity 03/17/2022   Bilateral tinnitus 123456   Umbilical hernia 123456   Urinary incontinence 03/17/2022   Urticaria due to drug allergy 03/17/2022   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 Pt accompanied by: self and significant other (dropped her off)  PERTINENT HISTORY: HTN, hyperlipidemia, Vitamin D deficiency, Pre-diabetes  PRECAUTIONS: Other: no lifting, wearing splint "most of the time" and remove for exercises  WEIGHT BEARING RESTRICTIONS: Yes no lifting  PAIN:  Are you having pain? Yes: NPRS scale: 0-1/10 Pain location: wrist Pain description: sore Aggravating factors: none stated Relieving factors: repositioning  FALLS: Has patient fallen in last 6 months? No  LIVING ENVIRONMENT: Lives with: lives with their spouse Lives in: House/apartment Stairs: Yes: Internal: art studios is up full flight of steps and External: 2 steps; none Has following equipment at home: None  PLOF: Independent  PATIENT GOALS: to be able to use my hand as  well as I could before  NEXT MD VISIT: 3/11//24  OBJECTIVE:   HAND DOMINANCE: Right  ADLs: Overall ADLs: Currently wearing gown and robe and/or legging style pants primarily due to difficulty with managing pants with button/zipper and not wanting help  FUNCTIONAL OUTCOME MEASURES: Quick Dash: 56.8% disability/symptom score  UPPER EXTREMITY ROM:     Active ROM Right eval Left eval Left 03/02/23 Left 03/23/23  Shoulder flexion      Shoulder abduction      Shoulder adduction      Shoulder extension      Shoulder internal rotation      Shoulder external rotation      Elbow flexion      Elbow extension      Wrist flexion  56 60 60  Wrist extension  48 54 54  Wrist ulnar deviation  28 26 26   Wrist radial deviation  10 16 18   Wrist pronation  87 90 90  Wrist supination  86 86 90  (Blank rows = not tested)  Active ROM Right eval Left eval Left 03/02/23 Left 03/23/23  Thumb  MCP (0-60)  30 40 32  Thumb IP (0-80)  55 48 60  Thumb Radial abd/add (0-55)       Thumb Palmar abd/add (0-45)       Thumb Opposition to Small Finger       Index MCP (0-90)       Index PIP (0-100)       Index DIP (0-70)        Long MCP (0-90)        Long PIP (0-100)        Long DIP (0-70)        Ring MCP (0-90)        Ring PIP (0-100)        Ring DIP (0-70)        Little MCP (0-90)        Little PIP (0-100)        Little DIP (0-70)        (Blank rows = not tested)  HAND FUNCTION: 03/21/23 Grip strength: Right: 39 lbs; Left: 14 lbs, 3 point pinch: Right: 12 lbs, Left: 5 lbs, and Tip pinch: Right 12 lbs, Left: 6 lbs  COORDINATION: Not assessed due to restrictions s/p surgery  SENSATION: Paresthesias with palpation at scar  EDEMA: swelling in [facility] joint, however per pt has decreased  COGNITION: Overall cognitive status: Within functional limits for tasks assessed    TODAY'S TREATMENT:                                                                                 03/23/23 Therapeutic  exercise: Prayer stretch with focus on wrist extension within pain tolerance.  Pt reports mild increased pain but not intolerable.  Engaged in thumb stretch downward to further facilitate composite flexion especially at MP joint.  Towel roll grip with focus on sustained grasp.  OT educated on index and long finger abduction and C-strength with and without rubber band.  Pt unable to complete C-strength with rubber band, therefore educated on positioning and technique and encouraged stretch at thumb in "C" position.    03/21/23 Manual therapy: OT completing joint mobilizations and facilitating stretch in thenar eminence to facility increased thumb positioning and mobility.  Pt reporting no pain with massage and mobilizations this session. Grip strength: Right: 39 lbs; Left: 14 lbs, 3 point pinch: Right: 12 lbs, Left: 5 lbs, and Tip pinch: Right 12 lbs, Left: 6 lbs. Therapeutic exercise: OT instructed pt with demonstration and verbal cues in Seated Wrist Flexion Stretch, Wrist Prayer Stretch, Stretch Thumb DOWNWARD, Thumb Webspace Stretch, and Towel Roll Grip with Forearm in Neutral.  Pt reports mild pain with wrist flexion and demonstrating difficulty with wrist extension on L during prayer stretch.      03/16/23 Therapeutic activity: engaged in flipping cards and turning pages in magazine with focus on thumb adduction and flexion.  Pt continues to demonstrate decreased proximity of thumb to index finger. Manual therapy: OT completing joint mobilizations at [facility] and MP thumb joint with focus on adduction, extension, and circumduction.  Pt reporting mild pain in thumb with mobilizations and pressure.  OT facilitating stretch and mobility in thenar eminence to facility increased thumb positioning and  ability to increase flattening of hand.   PATIENT EDUCATION: Education details: Increased focus on stable CMC joint and massage Person educated: Patient Education method: Explanation, Demonstration, Verbal  cues, and Handouts Education comprehension: verbalized understanding and returned demonstration  HOME EXERCISE PROGRAM: Access Code: [email or web address] URL: [email or web address] Date: 03/23/2023 Prepared by: [facility]  Exercises - Seated Wrist Flexion Stretch  - 2 x daily - 3 reps - 15 hold - Wrist Prayer Stretch  - 2 x daily - 3 reps - 15 sec hold - Seated Wrist Radial and Ulnar Deviation AROM  - 2 x daily - 1 sets - 10 reps - 3-5 sec hold - Seated Thumb IP Flexion AROM with Blocking  - 2 x daily - 1 sets - 10 reps - 3-5 sec hold - Thumb AROM Opposition To All Fingers  - 2 x daily - 1 sets - 10 reps - Seated Thumb Composite Flexion AROM  - 2 x daily - 1 sets - 10 reps - Stretch Thumb DOWNWARD  - 2 x daily - 3 reps - 15 sec hold - Thumb Webspace Stretch  - 2 x daily - 3 reps - 15 sec hold - Towel Roll Grip with Forearm in Neutral  - 2 x daily - 5 reps - 10 sec hold - Seated Wrist Extension with Dumbbell  - 2 x daily - 1 sets - 10 reps - 3 sec hold - Seated Wrist Radial Deviation with Dumbbell  - 2 x daily - 1 sets - 10 reps - Thumb Abduction/Adduction Strengthening With Putty  - 2 x daily - 1 sets - 10 reps - Thumb Opposition with Putty  - 2 x daily - 1 sets - 10 reps - Spread Index Finger Apart  - 2 x daily - 5 reps - 5 sec hold - C-Strength (try using rubber band)   - 2 x daily - 5 reps - 5 sec hold  GOALS: Goals reviewed with patient? Yes  SHORT TERM GOALS: Target date: 03/03/23  Pt will report decreased pain level by 50%. Baseline: Goal status: MET - 03/02/23  2.  Pt will improve composite thumb flexion by 20-30 degrees. Baseline: MCP 30, IP 55 Goal status: NOT MET - +8* on 03/02/23  3.  Pt will improve active wrist flexion by 15-20 degrees, and extension by 10-15 degrees. Baseline: flexion 56, extension 48 Goal status: NOT MET - +10* on 03/02/23  4.  Patient will be able to utilize Left hand as normal assist during ADLs. Baseline:   Goal status: NOT MET - significantly improved but still not utilizing as normal assist (particularly with pants up/down) 03/02/23   LONG TERM GOALS: Target date: 03/24/23  Pt will regain sufficient hand/wrist strength for home maintenance tasks, and leisure activities. Baseline:  Goal status: NOT MET  2.  Patient will gradually return to her normal activity level without pain. Baseline:  Goal status: NOT MET  3.  Pt will report improved functional use of LUE as evidenced by improved score on Quick DASH to <35% impairment. Baseline: 56.8% Goal status: IN PROGRESS  NEW LONG TERM GOALS: Target date: 04/21/23  Pt will regain sufficient hand/wrist strength for home maintenance tasks, and leisure activities. Baseline: Grip strength: Right: 39 lbs; Left: 14 lbs, 3 point pinch: Right: 12 lbs, Left: 5 lbs, and Tip pinch: Right 12 lbs, Left: 6 lbs Goal status: INITIAL  2. Pt will improve active wrist flexion by 10-15 degrees, and extension by  5-10 degrees. Baseline: flexion 60, extension 54 Goal status: INITIAL  3. Pt will improve composite thumb flexion by 15-20 degrees. Baseline: MCP 32, IP 60 Goal status: INITIAL  4.  Patient will gradually return to her normal activity level without pain. Baseline:  Goal status: INITIAL  5.  Pt will report improved functional use of LUE as evidenced by improved score on Quick DASH to <35% impairment. Baseline: 56.8% Goal status: INITIAL  ASSESSMENT:  CLINICAL IMPRESSION: Patient is 12 weeks (Surgery 12/29/22) from Left thumb trapeziectomy with internal brace suspensioplasty and partial trapezoidectomy. Pt continues to c/o pain around [facility] joint and in wrist with increased flexion and extension.  OT providing education on rationale of stability of thumb and therefore decreased ability to achieve full flatness of hand as well as expectation of recovery at this point with goal to be about 70-80% of function and much improved pain.  OT introduced grip  strength and Stable - C ROM and strengthening with and without resistance.  Pt demonstrating decreased tolerance to use of rubber band for resistance, therefore educated on applying pressure with opposite hand for increased resistance.  Pt will benefit from additional OT services to focus on grip and pinch strength, further return of thumb mobility and flexion, and improved wrist flexion/extension.   PERFORMANCE DEFICITS: in functional skills including ADLs, IADLs, coordination, dexterity, sensation, edema, ROM, strength, pain, flexibility, and UE functional use.  IMPAIRMENTS: are limiting patient from ADLs, IADLs, and leisure.   COMORBIDITIES: may have co-morbidities  that affects occupational performance. Patient will benefit from skilled OT to address above impairments and improve overall function.  MODIFICATION OR ASSISTANCE TO COMPLETE EVALUATION: No modification of tasks or assist necessary to complete an evaluation.  OT OCCUPATIONAL PROFILE AND HISTORY: Problem focused assessment: Including review of records relating to presenting problem.  CLINICAL DECISION MAKING: LOW - limited treatment options, no task modification necessary  REHAB POTENTIAL: Good  EVALUATION COMPLEXITY: Low      PLAN:  OT FREQUENCY: 1x/week   OT DURATION: 4 weeks  PLANNED INTERVENTIONS: therapeutic exercise, neuromuscular re-education, manual therapy, scar mobilization, passive range of motion, splinting, moist heat, cryotherapy, and patient/family education  RECOMMENDED OTHER SERVICES: NA  CONSULTED AND AGREED WITH PLAN OF CARE: Patient  PLAN FOR NEXT SESSION: review HEP and add as applicable, focus on thumb adduction and increased stability against mild resistance. Focus on wrist stability and grip strengthening.  Conduct Quick Dash at next session.   Simonne Come, OTR/L 03/23/2023, 3:02 PM

## 2023-03-28 ENCOUNTER — Ambulatory Visit: Payer: Medicare Other | Attending: Orthopedic Surgery | Admitting: Occupational Therapy

## 2023-03-28 DIAGNOSIS — M25542 Pain in joints of left hand: Secondary | ICD-10-CM | POA: Diagnosis not present

## 2023-03-28 DIAGNOSIS — M6281 Muscle weakness (generalized): Secondary | ICD-10-CM | POA: Insufficient documentation

## 2023-03-28 NOTE — Therapy (Signed)
OUTPATIENT OCCUPATIONAL THERAPY ORTHO  Treatment Note  Patient Name: Wendy Joyce MRN: [id number] DOB:08-Feb-1946, 77 y.o., female Today's Date: 02/07/2023  PCP: [place], Blondell Reveal, MD REFERRING PROVIDER: Sherilyn Cooter, MD  END OF SESSION:  OT End of Session - 03/28/23 1150     Visit Number 10    Number of Visits 13    Date for OT Re-Evaluation 04/21/23    Authorization Type [employer] [place]    OT Start Time 73    OT Stop Time 1147    OT Time Calculation (min) 45 min                    Past Medical History:  Diagnosis Date   Abdominal pain, generalized 01/29/2015   Abnormal thyroid function test 02/25/2016   ADHD (attention deficit hyperactivity disorder)    Allergic rhinitis, unspecified 01/29/2015   Ankle sprain 03/16/2016   Anxiety 01/29/2015   Arthritis    hands   Chronic kidney disease 2012   kidney stones   Constipation 01/29/2015   Decreased libido 05/02/2018   Disorder of the skin and subcutaneous tissue, unspecified 01/29/2015   DJD (degenerative joint disease)    hand   Dysthymia    Endometriosis    External otitis    Family history of coronary artery disease 05/02/2018   Fatigue due to excessive exertion 02/24/2016   Fever blister    Fibromyalgia 01/29/2015   Frequency of micturition 01/29/2015   Functional dyspepsia 01/29/2015   Gastro-esophageal reflux disease without esophagitis 01/29/2015   Headache(784.0)    High cholesterol    Hyperlipidemia 09/12/2016   Hypersomnia    Hypothyroidism    IBS (irritable bowel syndrome) 05/02/2018   Insomnia    Internal hemorrhoids    Irritable 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 01/29/2015   Labial pain 11/30/2016   Lactose intolerance 12/14/2016   Lactose intolerance in adult 09/12/2016   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   Left ureteral stone    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   Major depressive disorder, single episode, unspecified    Migraine with aura, not intractable, without  status migrainosus    Mixed hyperlipidemia    Mouth pain 01/07/2016   Osteoarthritis of hip    Osteopenia determined by x-ray 03/02/2017   Personal history of estrogen therapy    Phlebitis and thrombophlebitis of the leg    Pityriasis alba 01/29/2015   Pure hypercholesterolemia 06/29/2016   Rectum pain 09/12/2016   Recurrent sinus infections 02/24/2016   Rhinosinusitis 01/07/2016   Rosacea    Sinusitis 12/24/2015   Tinnitus of left ear 123XX123   Umbilical hernia without obstruction and without gangrene 123XX123   Umbilical pain [id number]   Unspecified urinary incontinence 01/07/2016   Urticaria    UTI (urinary tract infection)    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   Vitamin D deficiency    Past Surgical History:  Procedure Laterality Date   BREAST ENHANCEMENT SURGERY     BREAST REDUCTION SURGERY Bilateral 2018   COLONOSCOPY WITH PROPOFOL N/A 02/25/2013   Procedure: COLONOSCOPY WITH PROPOFOL;  Surgeon: Garlan Fair, MD;  Location: [facility];  Service: Endoscopy;  Laterality: N/A;   mini facelift  2000   NASAL SINUS SURGERY     REDUCTION MAMMAPLASTY     ROTATOR CUFF REPAIR     Right   ROTATOR CUFF REPAIR     Left   SEPTOPLASTY WITH ETHMOIDECTOMY, AND MAXILLARY ANTROSTOMY Bilateral 2006   TOTAL ABDOMINAL  HYSTERECTOMY     ovaries remain   TUBAL LIGATION     umbicial hernia     Patient Active Problem List   Diagnosis Date Noted   Depression, recurrent 12/25/2022   Preoperative clearance 11/23/2022   Encounter for counseling 10/14/2022   Influenza 09/18/2022   Pain of left hand 06/29/2022   Vasomotor symptoms due to menopause 05/30/2022   Pre-diabetes 05/30/2022   Anxiety disorder 03/17/2022   Allergic rhinitis 03/17/2022   Constipation 03/17/2022   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   Exertional dyspnea 03/17/2022   Family history of coronary artery disease 03/17/2022   Weakness with dizziness 03/17/2022   Fever  blister 03/17/2022   Frequency of micturition 03/17/2022   Irritable bowel syndrome 03/17/2022   Lactose intolerance 03/17/2022   Left anterior fascicular block 123456   Lichen planus 123456   Panniculitis 03/17/2022   Refractory migraine with aura 03/17/2022   Statin not tolerated 03/17/2022   Thrombophlebitis of superficial veins of lower extremity 03/17/2022   Bilateral tinnitus 123456   Umbilical hernia 123456   Urinary incontinence 03/17/2022   Urticaria due to drug allergy 03/17/2022   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   Pt accompanied by: self and significant other (dropped her off)  PERTINENT HISTORY: HTN, hyperlipidemia, Vitamin D deficiency, Pre-diabetes  PRECAUTIONS: Other: no lifting, wearing splint "most of the time" and remove for exercises  WEIGHT BEARING RESTRICTIONS: Yes no lifting  PAIN:  Are you having pain? Yes: NPRS scale: 1/10 Pain location: wrist Pain description: sore Aggravating factors: wrist extension Relieving factors: repositioning  FALLS: Has patient fallen in last 6 months? No  LIVING ENVIRONMENT: Lives with: lives with their spouse Lives in: House/apartment Stairs: Yes: Internal: art studios is up full flight of steps and External: 2 steps; none Has following equipment at home: None  PLOF: Independent  PATIENT GOALS:  to be able to use my hand as well as I could before  NEXT MD VISIT: 3/11//24  OBJECTIVE:   HAND DOMINANCE: Right  ADLs: Overall ADLs: Currently wearing gown and robe and/or legging style pants primarily due to difficulty with managing pants with button/zipper and not wanting help  FUNCTIONAL OUTCOME MEASURES: Quick Dash: 56.8% disability/symptom score 03/28/23: 25% UPPER EXTREMITY ROM:     Active ROM Right eval Left eval Left 03/02/23 Left 03/23/23  Shoulder flexion      Shoulder abduction      Shoulder adduction      Shoulder extension      Shoulder internal rotation      Shoulder external rotation      Elbow flexion      Elbow extension      Wrist flexion  56 60 60  Wrist extension  48 54 54  Wrist ulnar deviation  28 26 26   Wrist radial deviation  10 16 18   Wrist pronation  87 90 90  Wrist supination  86 86 90  (Blank rows = not tested)  Active ROM Right eval Left eval Left 03/02/23 Left 03/23/23  Thumb MCP (0-60)  30 40 32  Thumb IP (0-80)  55 48 60  Thumb Radial abd/add (0-55)       Thumb Palmar abd/add (0-45)       Thumb Opposition to Small Finger       Index MCP (0-90)       Index PIP (0-100)       Index DIP (0-70)        Long MCP (0-90)        Long PIP (0-100)        Long DIP (0-70)        Ring MCP (0-90)        Ring PIP (0-100)        Ring DIP (0-70)        Little MCP (0-90)        Little PIP (0-100)        Little DIP (0-70)        (Blank rows = not tested)  HAND FUNCTION: 03/21/23 Grip strength: Right: 39 lbs; Left: 14 lbs, 3 point pinch: Right: 12 lbs, Left: 5 lbs, and Tip pinch: Right 12 lbs, Left: 6 lbs  COORDINATION: Not assessed due to restrictions s/p surgery  SENSATION: Paresthesias with palpation at scar  EDEMA: swelling in [facility] joint, however per pt has decreased  COGNITION: Overall cognitive status: Within functional limits for tasks assessed    TODAY'S TREATMENT:                                                                                 03/28/23 Quick Dash: 25% impairment with most difficulty still being with opening a new or tight jar and everything else at a mild or slightly limited score. Therapeutic exercises: grip on resistive ball with focus on stabilization at [facility] joint with squeeze.  Completed wrist flexion/extension on flexible resistance bar, pt reports mild discomfort with wrist extension. Thumb opposition on resistance bar. OT instructing pt in C-strength with and without resistance from rubberband.  Pt demonstrating decreased extension, maintaining thumb in depressed position even with attempts at extension. Manual therapy: OT completing joint mobilizations and facilitating stretch in thenar eminence and at [facility] joint to facility increased thumb positioning and mobility.  OT maintaining stretch position with no c/o pain.  Pt with mild increased ability to achieve position post stretch.    03/23/23 Therapeutic exercise: Prayer stretch with focus on wrist extension within pain tolerance.  Pt reports mild increased pain but not intolerable.  Engaged in thumb stretch downward to further facilitate composite flexion especially at MP joint.  Towel roll grip with focus on sustained grasp.  OT educated on index and long finger abduction and C-strength with and without rubber band.  Pt unable to complete C-strength with rubber band, therefore educated on positioning and technique and encouraged stretch at thumb in "C" position.    03/21/23 Manual therapy: OT completing joint mobilizations and facilitating stretch in thenar eminence to facility increased thumb positioning and mobility.  Pt reporting no pain with massage and mobilizations this session. Grip strength: Right: 39 lbs; Left: 14 lbs, 3 point pinch: Right: 12  lbs, Left: 5 lbs, and Tip pinch: Right 12 lbs, Left: 6 lbs. Therapeutic exercise: OT instructed pt with demonstration and verbal cues in Seated Wrist Flexion Stretch, Wrist Prayer Stretch, Stretch Thumb  DOWNWARD, Thumb Webspace Stretch, and Towel Roll Grip with Forearm in Neutral.  Pt reports mild pain with wrist flexion and demonstrating difficulty with wrist extension on L during prayer stretch.     PATIENT EDUCATION: Education details: Increased focus on stable CMC joint and massage Person educated: Patient Education method: Explanation, Demonstration, Verbal cues, and Handouts Education comprehension: verbalized understanding and returned demonstration  HOME EXERCISE PROGRAM: Access Code: [email or web address] URL: [email or web address] Date: 03/23/2023 Prepared by: [facility]  Exercises - Seated Wrist Flexion Stretch  - 2 x daily - 3 reps - 15 hold - Wrist Prayer Stretch  - 2 x daily - 3 reps - 15 sec hold - Seated Wrist Radial and Ulnar Deviation AROM  - 2 x daily - 1 sets - 10 reps - 3-5 sec hold - Seated Thumb IP Flexion AROM with Blocking  - 2 x daily - 1 sets - 10 reps - 3-5 sec hold - Thumb AROM Opposition To All Fingers  - 2 x daily - 1 sets - 10 reps - Seated Thumb Composite Flexion AROM  - 2 x daily - 1 sets - 10 reps - Stretch Thumb DOWNWARD  - 2 x daily - 3 reps - 15 sec hold - Thumb Webspace Stretch  - 2 x daily - 3 reps - 15 sec hold - Towel Roll Grip with Forearm in Neutral  - 2 x daily - 5 reps - 10 sec hold - Seated Wrist Extension with Dumbbell  - 2 x daily - 1 sets - 10 reps - 3 sec hold - Seated Wrist Radial Deviation with Dumbbell  - 2 x daily - 1 sets - 10 reps - Thumb Abduction/Adduction Strengthening With Putty  - 2 x daily - 1 sets - 10 reps - Thumb Opposition with Putty  - 2 x daily - 1 sets - 10 reps - Spread Index Finger Apart  - 2 x daily - 5 reps - 5 sec hold - C-Strength (try using rubber band)   - 2 x daily - 5 reps - 5 sec hold  GOALS: Goals reviewed with patient? Yes  SHORT TERM GOALS: Target date: 03/03/23  Pt will report decreased pain level by 50%. Baseline: Goal status: MET - 03/02/23  2.  Pt will  improve composite thumb flexion by 20-30 degrees. Baseline: MCP 30, IP 55 Goal status: NOT MET - +8* on 03/02/23  3.  Pt will improve active wrist flexion by 15-20 degrees, and extension by 10-15 degrees. Baseline: flexion 56, extension 48 Goal status: NOT MET - +10* on 03/02/23  4.  Patient will be able to utilize Left hand as normal assist during ADLs. Baseline:  Goal status: NOT MET - significantly improved but still not utilizing as normal assist (particularly with pants up/down) 03/02/23   LONG TERM GOALS: Target date: 03/24/23  Pt will regain sufficient hand/wrist strength for home maintenance tasks, and leisure activities. Baseline:  Goal status: NOT MET  2.  Patient will gradually return to her normal activity level without pain. Baseline:  Goal status: NOT MET  3.  Pt will report improved functional use of LUE as evidenced by improved score on Quick DASH to <35% impairment. Baseline: 56.8% Goal status: IN PROGRESS  NEW LONG TERM GOALS: Target  date: 04/21/23  Pt will regain sufficient hand/wrist strength for home maintenance tasks, and leisure activities. Baseline: Grip strength: Right: 39 lbs; Left: 14 lbs, 3 point pinch: Right: 12 lbs, Left: 5 lbs, and Tip pinch: Right 12 lbs, Left: 6 lbs Goal status: IN PROGRESS  2. Pt will improve active wrist flexion by 10-15 degrees, and extension by 5-10 degrees. Baseline: flexion 60, extension 54 Goal status: IN PROGRESS  3. Pt will improve composite thumb flexion by 15-20 degrees. Baseline: MCP 32, IP 60 Goal status:IN PROGRESS  4.  Patient will gradually return to her normal activity level without pain. Baseline:  Goal status: IN PROGRESS  5.  Pt will report improved functional use of LUE as evidenced by improved score on Quick DASH to <35% impairment. Baseline: 56.8% Goal status: Met - 25%  ASSESSMENT:  CLINICAL IMPRESSION: Patient is 12 weeks, 5 days (Surgery 12/29/22) from Left thumb trapeziectomy with internal brace  suspensioplasty and partial trapezoidectomy. Pt continues to c/o pain around [facility] joint and in wrist with increased flexion and extension.  Pt responding well to manual therapy with stretch and massage at thenar webspace and CMC joint.  Pt demonstrating improvements in functional use of LUE as evidenced by improvements on Quick Dash.    PERFORMANCE DEFICITS: in functional skills including ADLs, IADLs, coordination, dexterity, sensation, edema, ROM, strength, pain, flexibility, and UE functional use.  IMPAIRMENTS: are limiting patient from ADLs, IADLs, and leisure.   COMORBIDITIES: may have co-morbidities  that affects occupational performance. Patient will benefit from skilled OT to address above impairments and improve overall function.  MODIFICATION OR ASSISTANCE TO COMPLETE EVALUATION: No modification of tasks or assist necessary to complete an evaluation.  OT OCCUPATIONAL PROFILE AND HISTORY: Problem focused assessment: Including review of records relating to presenting problem.  CLINICAL DECISION MAKING: LOW - limited treatment options, no task modification necessary  REHAB POTENTIAL: Good  EVALUATION COMPLEXITY: Low      PLAN:  OT FREQUENCY: 1x/week   OT DURATION: 4 weeks  PLANNED INTERVENTIONS: therapeutic exercise, neuromuscular re-education, manual therapy, scar mobilization, passive range of motion, splinting, moist heat, cryotherapy, and patient/family education  RECOMMENDED OTHER SERVICES: NA  CONSULTED AND AGREED WITH PLAN OF CARE: Patient  PLAN FOR NEXT SESSION: review HEP and add as applicable, focus on thumb adduction and increased stability against mild resistance. Focus on wrist stability and grip strengthening.     Simonne Come, OTR/L 03/28/2023, 11:51 AM

## 2023-03-31 DIAGNOSIS — R69 Illness, unspecified: Secondary | ICD-10-CM | POA: Diagnosis not present

## 2023-04-03 ENCOUNTER — Other Ambulatory Visit (HOSPITAL_BASED_OUTPATIENT_CLINIC_OR_DEPARTMENT_OTHER): Payer: Self-pay

## 2023-04-03 ENCOUNTER — Ambulatory Visit
Admission: RE | Admit: 2023-04-03 | Discharge: 2023-04-03 | Disposition: A | Discharge status: Home / Self Care | Payer: No Typology Code available for payment source | Source: Ambulatory Visit | Attending: Family Medicine | Admitting: Family Medicine

## 2023-04-03 DIAGNOSIS — I2510 Atherosclerotic heart disease of native coronary artery without angina pectoris: Secondary | ICD-10-CM

## 2023-04-03 MED ORDER — VALACYCLOVIR HCL 500 MG PO TABS
500.0000 mg | ORAL_TABLET | Freq: Two times a day (BID) | ORAL | 2 refills | Status: DC
  Filled 2023-04-03 – 2023-08-07 (×2): qty 180, 90d supply, fill #0
  Filled 2023-11-04 (×2): qty 180, 90d supply, fill #1
  Filled 2024-02-02: qty 180, 90d supply, fill #2

## 2023-04-04 DIAGNOSIS — R69 Illness, unspecified: Secondary | ICD-10-CM | POA: Diagnosis not present

## 2023-04-10 ENCOUNTER — Encounter: Payer: BC Managed Care – PPO | Admitting: Occupational Therapy

## 2023-04-12 ENCOUNTER — Other Ambulatory Visit (HOSPITAL_BASED_OUTPATIENT_CLINIC_OR_DEPARTMENT_OTHER): Payer: Self-pay

## 2023-04-12 ENCOUNTER — Encounter (HOSPITAL_BASED_OUTPATIENT_CLINIC_OR_DEPARTMENT_OTHER): Payer: Self-pay | Admitting: Family Medicine

## 2023-04-12 ENCOUNTER — Ambulatory Visit (INDEPENDENT_AMBULATORY_CARE_PROVIDER_SITE_OTHER): Payer: Medicare Other | Admitting: Family Medicine

## 2023-04-12 ENCOUNTER — Other Ambulatory Visit (HOSPITAL_BASED_OUTPATIENT_CLINIC_OR_DEPARTMENT_OTHER): Payer: Self-pay | Admitting: Nurse Practitioner

## 2023-04-12 VITALS — BP 112/68 | HR 66 | Temp 97.8°F | Ht 63.0 in | Wt 140.0 lb

## 2023-04-12 DIAGNOSIS — R1011 Right upper quadrant pain: Secondary | ICD-10-CM | POA: Diagnosis not present

## 2023-04-12 MED ORDER — TETANUS-DIPHTH-ACELL PERTUSSIS 5-2.5-18.5 LF-MCG/0.5 IM SUSY
0.5000 mL | PREFILLED_SYRINGE | INTRAMUSCULAR | 0 refills | Status: DC
  Filled 2023-04-12: qty 0.5, 1d supply, fill #0

## 2023-04-12 NOTE — Progress Notes (Signed)
Acute Office Visit  Subjective:     Patient ID: Wendy Joyce, female    DOB: 09-28-1946, 77 y.o.   MRN: [id number]  Wendy Joyce is a 77 yo female who presents today for RUQ pain and concerns about her gallbladder.   Abdominal Pain: Patient complains of abdominal pain. The pain is located in the RUQ. The pain is described as stabbing/nagging, and is 9/10 when it becomes the worst intensity. She reports being tender all the time but the pain has increased recently. Onset was 1 year ago. Symptoms have been unchanged since. Aggravating factors include fatty foods, movement, and spicy foods. She reports that "rich" foods make the pain stronger.  Alleviating factors include reclining in a chair. Associated symptoms include nausea. The patient denies anorexia, chills, constipation, diarrhea, fever, hematuria, and myalgias. She does have a history of IBS (dairy & gluten free diet).   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 She reports that she has been working on removing the toxins from her body since June 2023. 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 She has been taking the supplements to rid her body of these. She is still in the process of detoxing her liver and feeling very nauseous. She has been seeing a functional medicine doctor who reported that she is most likely having issues with her gallbladder; however, MD was unable to order testing.    Review of Systems  Constitutional:  Negative for chills, fever, malaise/fatigue and weight loss.  Respiratory:  Negative for cough and shortness of breath.   Cardiovascular:  Negative for chest pain and palpitations.  Gastrointestinal:  Positive for abdominal pain (RUQ). Negative for nausea and vomiting.  Genitourinary:  Negative for flank pain and hematuria.  Musculoskeletal:  Negative for myalgias.  Skin:  Negative for rash.  Neurological:  Negative  for weakness and headaches.  Psychiatric/Behavioral:  Negative for depression and suicidal ideas. The patient is not nervous/anxious and does not have insomnia.       Objective:    BP 112/68 (BP Location: Left Arm, Patient Position: Sitting, Cuff Size: Normal)   Pulse 66   Temp 97.8 F (36.6 C) (Oral)   Ht  (1.6 m)   Wt 140 lb (63.5 kg)   SpO2 99%   BMI 24.80 kg/m   Physical Exam Constitutional:      Appearance: Normal appearance. She is well-developed.  Cardiovascular:     Rate and Rhythm: Normal rate and regular rhythm.     Heart sounds: Normal heart sounds.  Pulmonary:     Effort: Pulmonary effort is normal.     Breath sounds: Normal breath sounds.  Abdominal:     General: Bowel sounds are normal.     Palpations: Abdomen is soft.     Tenderness: There is abdominal tenderness in the right upper quadrant. Positive signs include Murphy's sign.  Neurological:     Mental Status: She is alert.    Assessment & Plan:  1. RUQ abdominal pain Patient presents with abdominal pain in the right upper quadrant.  She describes this pain as a constant tenderness that is nagging, but when the pain increases, it feels like a stabbing.  Reports nausea and decreased in appetite. Denies episodes of emesis, fever, body aches/chills, unexplained weight loss, changes in bowel habits, intractable vomiting, dysuria, hematuria, recent antibiotic use, severe pain, blood in stool, or syncopal episodes. On  exam, patient is well-appearing and in no acute distress. Cardiovascular exam with heart regular rate and rhythm. Lungs clear to auscultation bilaterally. Bowel sounds heard in all abdominal quadrants. Abdominal tenderness present in the RUQ with + Murphy's sign. Some guarding present. No rebound tenderness present.  Initial testing will include CMP to assess liver function, CBC to assess infection, and amylase to determine if pancreas is involved instead. Due to duration of symptoms, also placed  abdominal ultrasound order. Patient is agreeable to plan. Advised her to return to office if symptoms progress or worsen.  - CBC with Differential/Platelet - Comprehensive metabolic panel - Amylase - US Abdomen Limited RUQ (LIVER/GB); Future   Return if symptoms worsen or fail to improve.  Alyson Reedy, FNP

## 2023-04-13 ENCOUNTER — Ambulatory Visit (INDEPENDENT_AMBULATORY_CARE_PROVIDER_SITE_OTHER): Payer: Medicare Other | Admitting: Nurse Practitioner

## 2023-04-13 ENCOUNTER — Encounter: Payer: Self-pay | Admitting: Nurse Practitioner

## 2023-04-13 VITALS — BP 112/64 | HR 58 | Ht 63.5 in | Wt 145.6 lb

## 2023-04-13 DIAGNOSIS — Z1211 Encounter for screening for malignant neoplasm of colon: Secondary | ICD-10-CM

## 2023-04-13 DIAGNOSIS — R140 Abdominal distension (gaseous): Secondary | ICD-10-CM | POA: Diagnosis not present

## 2023-04-13 DIAGNOSIS — K589 Irritable 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: Secondary | ICD-10-CM

## 2023-04-13 LAB — COMPREHENSIVE METABOLIC PANEL
ALT: 18 IU/L (ref 0–32)
AST: 16 IU/L (ref 0–40)
Albumin/Globulin Ratio: 1.9 (ref 1.2–2.2)
Albumin: 4.2 g/dL (ref 3.8–4.8)
Alkaline Phosphatase: 49 IU/L (ref 44–121)
BUN/Creatinine Ratio: 35 — ABNORMAL HIGH (ref 12–28)
BUN: 17 mg/dL (ref 8–27)
Bilirubin Total: <0.2 mg/dL (ref 0.0–1.2)
CO2: 22 mmol/L (ref 20–29)
Calcium: 9.4 mg/dL (ref 8.7–10.3)
Chloride: 105 mmol/L (ref 96–106)
Creatinine, Ser: 0.48 mg/dL — ABNORMAL LOW (ref 0.57–1.00)
Globulin, Total: 2.2 g/dL (ref 1.5–4.5)
Glucose: 103 mg/dL — ABNORMAL HIGH (ref 70–99)
Potassium: 5 mmol/L (ref 3.5–5.2)
Sodium: 141 mmol/L (ref 134–144)
Total Protein: 6.4 g/dL (ref 6.0–8.5)
eGFR: 99 mL/min/{1.73_m2} (ref >59)

## 2023-04-13 LAB — AMYLASE: Amylase: 43 U/L (ref 31–110)

## 2023-04-13 NOTE — Patient Instructions (Addendum)
You have been scheduled for a colonoscopy. Please follow written instructions given to you at your visit today.  Please pick up your prep supplies at the pharmacy within the next 1-3 days. If you use inhalers (even only as needed), please bring them with you on the day of your procedure.  IBGuard- 1 by mouth twice daily  Due to recent changes in healthcare laws, you may see the results of your imaging and laboratory studies on MyChart before your provider has had a chance to review them.  We understand that in some cases there may be results that are confusing or concerning to you. Not all laboratory results come back in the same time frame and the provider may be waiting for multiple results in order to interpret others.  Please give us 48 hours in order for your provider to thoroughly review all the results before contacting the office for clarification of your results.   Thank you for trusting me with your gastrointestinal care!   Colleen Kennedy-Smith, CRNP   

## 2023-04-13 NOTE — Progress Notes (Signed)
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 Patient was previously followed by [facility] gastroenterology and she elected to transition her GI management with Dr. Rhea Belton.  She presents to our office today as referred by Dr. Ihor Dow to schedule a screening colonoscopy.  She underwent a colonoscopy by Dr. Danise Edge 02/25/2013 which was normal.  She was advised to repeat a colonoscopy in 10 years.  No known family history of colon polyps or colorectal cancer.  She endorses having a history of IBS with alternating constipation diarrhea, ever, since she started taking multiple supplements for reported evaded arsenic levels her IBS symptoms have improved.  She reported undergoing arsenic detoxification by Dr. Derrell Lolling at [facility] [place].  She takes digestive enzyme ultra with Betaine HCL, multivitamin daily, 5-HTP, Magnesium L-Threonate, Chlorella, Ashwagandha, Liver GI Detox, Omega Genics, Trace Mineral drops and D3 = K2.  She typically passes a soft to formed brown bowel movement daily.  However, 3 weeks ago she passed a large amount of soft serve ice cream type stool with associated stabbing RUQ pain and a similar episode of RUQ pain and large BM 1 week later without recurrence.  No nausea or vomiting.  No fevers.  She has occasional abdominal bloat and increased flatulence. No known family history of colorectal cancer.      Latest Ref Rng & Units 11/10/2022    3:39 PM 06/17/2022   10:16 AM 03/18/2022    9:35 AM  CBC  WBC 3.4 - 10.8 x10E3/uL 7.6  5.6  5.9   Hemoglobin 11.1 - 15.9 g/dL 40.9  81.1  91.4   Hematocrit 34.0 - 46.6 % 42.5  42.6  40.8    Platelets 150 - 450 x10E3/uL 227  185  214        Latest Ref Rng & Units 04/12/2023    1:43 PM 11/10/2022    3:39 PM 06/17/2022   10:16 AM  CMP  Glucose 70 - 99 mg/dL 782  98  83   BUN 8 - 27 mg/dL Creatinine 0.57 - 1.00 mg/dL 9.56  2.13  0.86   Sodium 134 - 144 mmol/L 141  139  142   Potassium 3.5 - 5.2 mmol/L 5.0  4.7  5.1   Chloride 96 - 106 mmol/L 105  103  104   CO2 20 - 29 mmol/L Calcium 8.7 - 10.3 mg/dL 9.4  57.8  46.9   Total Protein 6.0 - 8.5 g/dL 6.4  7.0  6.8   Total Bilirubin 0.0 - 1.2 mg/dL <6.2  0.2  0.3   Alkaline Phos 44 - 121 IU/L 49  50  48   AST 0 - 40 IU/L ALT 0 - 32 IU/L Lipase 43  Colonoscopy 02/25/2013 by Dr. Danise Edge: Procedure: Screening colonoscopy   Endoscopist: Danise Edge   Premedication: Propofol administered by anesthesia   Procedure: The patient was placed in the left lateral decubitus position. Anal inspection and digital rectal exam were normal. The Pentax pediatric colonoscope was introduced into the rectum and advanced to the cecum. A normal-appearing ileocecal valve and  appendiceal orifice were identified. Colonic preparation for the exam today was good.   Rectum. Normal. Retroflexed view of the distal rectum normal.   Sigmoid colon and descending colon. Normal.   Splenic flexure. Normal.   Transverse colon. Normal.   Hepatic flexure. Normal.   Ascending colon. Normal.   Cecum and ileocecal valve. Normal.   Assessment: Normal screening proctocolon   Past Medical History:  Diagnosis Date   Abdominal pain, generalized 01/29/2015   Abnormal thyroid function test 02/25/2016   ADHD (attention deficit hyperactivity disorder)    Allergic rhinitis, unspecified 01/29/2015   Ankle sprain 03/16/2016   Anxiety 01/29/2015   Arthritis    hands   Chronic kidney disease 2012   kidney stones   Constipation 01/29/2015   Decreased libido 05/02/2018   Disorder of the skin and  subcutaneous tissue, unspecified 01/29/2015   DJD (degenerative joint disease)    hand   Dysthymia    Endometriosis    External otitis    Family history of coronary artery disease 05/02/2018   Fatigue due to excessive exertion 02/24/2016   Fever blister    Fibromyalgia 01/29/2015   Frequency of micturition 01/29/2015   Functional dyspepsia 01/29/2015   Gastro-esophageal reflux disease without esophagitis 01/29/2015   Headache(784.0)    High cholesterol    Hyperlipidemia 09/12/2016   Hypersomnia    Hypothyroidism    IBS (irritable bowel syndrome) 05/02/2018   Insomnia    Internal hemorrhoids    Irritable 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 01/29/2015   Labial pain 11/30/2016   Lactose intolerance 12/14/2016   Lactose intolerance in adult 09/12/2016   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   Left ureteral stone    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   Major depressive disorder, single episode, unspecified    Migraine with aura, not intractable, without status migrainosus    Mixed hyperlipidemia    Mouth pain 01/07/2016   Osteoarthritis of hip    Osteopenia determined by x-ray 03/02/2017   Personal history of estrogen therapy    Phlebitis and thrombophlebitis of the leg    Pityriasis alba 01/29/2015   Pure hypercholesterolemia 06/29/2016   Rectum pain 09/12/2016   Recurrent sinus infections 02/24/2016   Rhinosinusitis 01/07/2016   Rosacea    Sinusitis 12/24/2015   Tinnitus of left ear 01/29/2015   Umbilical hernia without obstruction and without gangrene 01/29/2015   Umbilical pain 05/02/2018   Unspecified urinary incontinence 01/07/2016   Urticaria    UTI (urinary tract infection)    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   Vitamin D deficiency    Past Surgical History:  Procedure Laterality Date   BREAST ENHANCEMENT SURGERY     BREAST REDUCTION SURGERY Bilateral 2018   COLONOSCOPY WITH PROPOFOL N/A 02/25/2013   Procedure:  COLONOSCOPY WITH PROPOFOL;  Surgeon: Charolett Bumpers, MD;  Location: [facility];  Service: Endoscopy;  Laterality: N/A;   mini facelift  2000   NASAL SINUS SURGERY     REDUCTION MAMMAPLASTY     ROTATOR CUFF REPAIR     Right   ROTATOR CUFF REPAIR     Left   SEPTOPLASTY WITH ETHMOIDECTOMY, AND MAXILLARY ANTROSTOMY Bilateral 2006   TOTAL ABDOMINAL HYSTERECTOMY     ovaries remain   TUBAL LIGATION     umbicial hernia     Social History: She is married. She has 2 children and 2 step. Nonsmoker. She drinks 2 glasses of wine of the weekends. No drugs use.   Family History:  Father had prostate cancer, hypertensin and CVA. Maternal aunt breast cancer.  No known family history of esophageal, gastric or colon cancer.   Allergies  Allergen Reactions   Adhesive [Tape] Dermatitis   Atovaquone-Proguanil Hcl Other (See Comments)   Bioflavonoids    Citrus Nausea And Vomiting   Escitalopram Hives   Escitalopram Oxalate Hives   Ezetimibe-Simvastatin Other (See Comments)   Niacin Other (See Comments)   Niacin And Related     Rash and breathing breathing problems   Rosuvastatin Other (See Comments)   Amoxicillin Rash    Unsure of reaction/ was instructed not to take drug. Unknown reaction    Hydrochlorothiazide Rash      Outpatient Encounter Medications as of 04/13/2023  Medication Sig   acyclovir ointment (ZOVIRAX) 5 % Apply 1 application. topically daily as needed (for fever blister).   albuterol (VENTOLIN HFA) 108 (90 Base) MCG/ACT inhaler Inhale 2 puffs into the lungs every 6 (six) hours as needed for wheezing or shortness of breath.   ALPRAZolam (XANAX) 0.25 MG tablet Take 1 to 2 tablets by mouth once daily as needed. Must last 30 days.   ALPRAZolam (XANAX) 0.25 MG tablet Take 1-2 tablets (0.25-0.5 mg total) by mouth daily as needed. **Must last 30 days **   azelastine (ASTELIN) 0.1 % nasal spray Use in each nostril as directed   cetirizine (ZYRTEC) 10 MG tablet Take 10 mg by mouth  daily.   COVID-19 mRNA vaccine 2023-2024 (COMIRNATY) SUSP injection Inject into the muscle.   donepezil (ARICEPT) 5 MG tablet Take 1 tablet (5 mg total) by mouth 2 (two) times daily.   estradiol (ESTRACE) 0.1 MG/GM vaginal cream Insert blueberry size amount of cream on finger (or 0.5 grams with applicator) in vagina daily for 2 weeks, THEN twice weekly.   estradiol (ESTRACE) 0.5 MG tablet Take 1 tablet (0.5 mg total) by mouth daily.   fluticasone (FLONASE) 50 MCG/ACT nasal spray Place 1 spray into both nostrils daily.   influenza vaccine adjuvanted (FLUAD QUADRIVALENT) 0.5 ML injection TO BE ADMINISTERED BY PHARMACIST FOR IMMUNIZATION   influenza vaccine adjuvanted (FLUAD) 0.5 ML injection Inject into the muscle.   levothyroxine (SYNTHROID) 112 MCG tablet Take 1 tablet (112 mcg total) by mouth daily.   metFORMIN (GLUCOPHAGE) 500 MG tablet Take 0.5 tablet by mouth daily for 7 days, THEN 0.5 tablet twice daily for 7 days, THEN 1 tablet in AM and 0.5 tablet in PM for 7 days, THEN 1 tablet twice daily.   montelukast (SINGULAIR) 10 MG tablet Take 1 tablet (10 mg total) by mouth daily for allergies   Naltrexone HCl, Pain, 4.5 MG CAPS Prescribed by psychiatry for depression. Once a day.   polyethylene glycol (MIRALAX / GLYCOLAX) 17 g packet Take 17 g by mouth daily.   progesterone (PROMETRIUM) 100 MG capsule Take 1 capsule (100 mg total) by mouth at bedtime for 7 days, THEN 2 capsules (200 mg total) at bedtime to help with sleep.   progesterone (PROMETRIUM) 100 MG capsule Take 1 capsule (100 mg total) by mouth before bedtime for 1 week. May increase to 2 capsules to help with sleep.   progesterone (PROMETRIUM) 100 MG capsule Take 1 capsule (100 mg total) by mouth at bedtime. May take 1 to 2 capsules at night   RSV vaccine recomb adjuvanted (AREXVY) 120 MCG/0.5ML injection Inject into the muscle.   Tdap (BOOSTRIX) 5-2.5-18.5 LF-MCG/0.5 injection Inject 0.5 mLs into the muscle.   valACYclovir (VALTREX) 500  MG tablet Take 1  tablet (500 mg total) by mouth 2 (two) times daily.   valACYclovir (VALTREX) 500 MG tablet Take 1 tablet (500 mg total) by mouth 2 (two) times daily.   zaleplon (SONATA) 5 MG capsule Take 1 capsule (5 mg total) by mouth at bedtime as needed for sleep.   Zoster Vaccine Adjuvanted [facility]) injection    [DISCONTINUED] buPROPion (WELLBUTRIN XL) 150 MG 24 hr tablet Take 1 tablet (150 mg total) by mouth.   [DISCONTINUED] prazosin (MINIPRESS) 2 MG capsule 1 TO 3 PO QHS (Patient not taking: Reported on 06/17/2022)   [DISCONTINUED] Vilazodone HCl (VIIBRYD) 10 MG TABS 1 PO QD W/DINNER   No facility-administered encounter medications on file as of 04/13/2023.   REVIEW OF SYSTEMS:  Gen: Denies fever, sweats or chills. No weight loss.  CV: Denies chest pain, palpitations or edema. Resp: Denies cough, shortness of breath of hemoptysis.  GI: See HPI. No GERD symptoms.  GU : Denies urinary burning, blood in urine, increased urinary frequency or incontinence. MS: Denies joint pain, muscles aches or weakness. Derm: Denies rash, itchiness, skin lesions or unhealing ulcers. Psych: + Anxiety.  Heme: Denies bruising, easy bleeding. Neuro:  Denies headaches, dizziness or paresthesias. Endo:  Denies any problems with DM, thyroid or adrenal function.  PHYSICAL EXAM: BP 112/64   Pulse (!) 58   Ht 5' 3.5" (1.613 m)   Wt 145 lb 9.6 oz (66 kg)   BMI 25.39 kg/m   General: 77 year old female in no acute distress. Head: Normocephalic and atraumatic. Eyes:  Sclerae non-icteric, conjunctive pink. Ears: Normal auditory acuity. Mouth: Dentition intact. No ulcers or lesions.  Neck: Supple, no lymphadenopathy or thyromegaly.  Lungs: Clear bilaterally to auscultation without wheezes, crackles or rhonchi. Heart: Regular rate and rhythm. No murmur, rub or gallop appreciated.  Abdomen: Soft, distended. No masses. No hepatosplenomegaly. Normoactive bowel sounds x 4 quadrants.  Rectal:  Deferred. Musculoskeletal: Symmetrical with no gross deformities. Skin: Warm and dry. No rash or lesions on visible extremities. Extremities: No edema. Neurological: Alert oriented x 4, no focal deficits.  Psychological:  Alert and cooperative. Normal mood and affect.  ASSESSMENT AND PLAN:  77 year old female presents to schedule a screening colonoscopy.  Normal colonoscopy by [facility] 02/2013. -Colonoscopy benefits and risks discussed including risk with sedation, risk of bleeding, perforation and infection  -Further recommendations to be determined after colonoscopy completed  RUQ pain x 2 episodes, first episode occurred 3 weeks ago, second episode occurred one week later. She noted passing a large soft nonbloody BM following these episodes. No active RUQ pain at this time, however, on exam she had mild tenderness to the mid right costal margin.  -Proceed with RUQ sono as ordered by PCP -Recommend repeating hepatic panel with lipase level if RUQ pain worsens -Consider EGD at time of colonoscopy to evaluate the duodenum if symptoms persist or worsen   IBS with prior alternating constipation/diarrhea which is stable, occasional abdominal bloat/increased gas -Ibgard one po bid PRN       CC:  [place], Buren Kos, MD

## 2023-04-17 ENCOUNTER — Ambulatory Visit: Payer: Medicare Other | Admitting: Occupational Therapy

## 2023-04-17 DIAGNOSIS — M6281 Muscle weakness (generalized): Secondary | ICD-10-CM

## 2023-04-17 DIAGNOSIS — M25542 Pain in joints of left hand: Secondary | ICD-10-CM | POA: Diagnosis not present

## 2023-04-17 NOTE — Progress Notes (Signed)
Addendum: Reviewed and agree with assessment and management plan. Peggy Loge M, MD  

## 2023-04-17 NOTE — Therapy (Addendum)
OUTPATIENT OCCUPATIONAL THERAPY ORTHO  Treatment Note  Patient Name: Wendy Joyce MRN: [id number] DOB:[birth year], 77 y.o., female Today's Date: 02/07/2023  PCP: [place], Buren Kos, MD REFERRING PROVIDER: Marlyne Beards, MD  END OF SESSION:  OT End of Session - 04/17/23 1539     Visit Number 11    Number of Visits 13    Date for OT Re-Evaluation 04/21/23    Authorization Type BC[employer]     OT Start Time 1538    OT Stop Time 1616    OT Time Calculation (min) 38 min                     Past Medical History:  Diagnosis Date   Abdominal pain, generalized 01/29/2015   Abnormal thyroid function test 02/25/2016   ADHD (attention deficit hyperactivity disorder)    Allergic rhinitis, unspecified 01/29/2015   Ankle sprain 03/16/2016   Anxiety 01/29/2015   Arthritis    hands   Chronic kidney disease 2012   kidney stones   Constipation 01/29/2015   Decreased libido 05/02/2018   Disorder of the skin and subcutaneous tissue, unspecified 01/29/2015   DJD (degenerative joint disease)    hand   Dysthymia    Endometriosis    External otitis    Family history of coronary artery disease 05/02/2018   Fatigue due to excessive exertion 02/24/2016   Fever blister    Fibromyalgia 01/29/2015   Frequency of micturition 01/29/2015   Functional dyspepsia 01/29/2015   Gastro-esophageal reflux disease without esophagitis 01/29/2015   Headache(784.0)    High cholesterol    Hyperlipidemia 09/12/2016   Hypersomnia    Hypothyroidism    IBS (irritable bowel syndrome) 05/02/2018   Insomnia    Internal hemorrhoids    Irritable 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 01/29/2015   Labial pain 11/30/2016   Lactose intolerance 12/14/2016   Lactose intolerance in adult 09/12/2016   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   Left ureteral stone    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   Major depressive disorder, single episode, unspecified    Migraine with aura, not intractable, without  status migrainosus    Mixed hyperlipidemia    Mouth pain 01/07/2016   Osteoarthritis of hip    Osteopenia determined by x-ray 03/02/2017   Personal history of estrogen therapy    Phlebitis and thrombophlebitis of the leg    Pityriasis alba 01/29/2015   Pure hypercholesterolemia 06/29/2016   Rectum pain 09/12/2016   Recurrent sinus infections 02/24/2016   Rhinosinusitis 01/07/2016   Rosacea    Sinusitis 12/24/2015   Tinnitus of left ear 01/29/2015   Umbilical hernia without obstruction and without gangrene 01/29/2015   Umbilical pain 05/02/2018   Unspecified urinary incontinence 01/07/2016   Urticaria    UTI (urinary tract infection)    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   Vitamin D deficiency    Past Surgical History:  Procedure Laterality Date   BREAST ENHANCEMENT SURGERY     BREAST REDUCTION SURGERY Bilateral 2018   COLONOSCOPY WITH PROPOFOL N/A 02/25/2013   Procedure: COLONOSCOPY WITH PROPOFOL;  Surgeon: Charolett Bumpers, MD;  Location: [facility];  Service: Endoscopy;  Laterality: N/A;   mini facelift  2000   NASAL SINUS SURGERY     REDUCTION MAMMAPLASTY     ROTATOR CUFF REPAIR     Right   ROTATOR CUFF REPAIR     Left   SEPTOPLASTY WITH ETHMOIDECTOMY, AND MAXILLARY ANTROSTOMY Bilateral 2006   TOTAL  ABDOMINAL HYSTERECTOMY     ovaries remain   TUBAL LIGATION     umbicial hernia     Patient Active Problem List   Diagnosis Date Noted   RUQ abdominal pain 04/12/2023   Depression, recurrent 12/25/2022   Preoperative clearance 11/23/2022   Encounter for counseling 10/14/2022   Influenza 09/18/2022   Pain of left hand 06/29/2022   Vasomotor symptoms due to menopause 05/30/2022   Pre-diabetes 05/30/2022   Anxiety disorder 03/17/2022   Allergic rhinitis 03/17/2022   Constipation 03/17/2022   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   Exertional dyspnea 03/17/2022   Family history of coronary artery disease 03/17/2022   Weakness  with dizziness 03/17/2022   Fever blister 03/17/2022   Frequency of micturition 03/17/2022   Irritable bowel syndrome 03/17/2022   Lactose intolerance 03/17/2022   Left anterior fascicular block 03/17/2022   Lichen planus 03/17/2022   Panniculitis 03/17/2022   Refractory migraine with aura 03/17/2022   Statin not tolerated 03/17/2022   Thrombophlebitis of superficial veins of lower extremity 03/17/2022   Bilateral tinnitus 03/17/2022   Umbilical hernia 03/17/2022   Urinary incontinence 03/17/2022   Urticaria due to drug allergy 03/17/2022   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 03/17/2022   Sexual dysfunction 03/17/2022   Imbalance 03/17/2022   Fibromyalgia 09/21/2021   Hyperlipidemia 09/21/2021   Hashimoto's thyroiditis 03/31/2021   Hypothyroidism 09/27/2013    ONSET DATE: 12/29/22  REFERRING DIAG: M18.12 (ICD-10-CM) - 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 Yes: NPRS scale: 1/10 Pain location: wrist Pain description: sore Aggravating factors: wrist extension Relieving factors: repositioning  FALLS: Has patient fallen in last 6 months? No  LIVING ENVIRONMENT: Lives with: lives with their spouse Lives in: House/apartment Stairs: Yes: Internal: art studios is up full flight of steps and External: 2 steps; none Has following equipment at home: None  PLOF: Independent  PATIENT GOALS: to be  able to use my hand as well as I could before  NEXT MD VISIT: 3/11//24  OBJECTIVE:   HAND DOMINANCE: Right  ADLs: Overall ADLs: Currently wearing gown and robe and/or legging style pants primarily due to difficulty with managing pants with button/zipper and not wanting help  FUNCTIONAL OUTCOME MEASURES: Quick Dash: 56.8% disability/symptom score 03/28/23: 25% UPPER EXTREMITY ROM:     Active ROM Right eval Left eval Left 03/02/23 Left 03/23/23  Shoulder flexion      Shoulder abduction      Shoulder adduction      Shoulder extension      Shoulder internal rotation      Shoulder external rotation      Elbow flexion      Elbow extension      Wrist flexion  56 60 60  Wrist extension  48 54 54  Wrist ulnar deviation  Wrist radial deviation  Wrist pronation  87 90 90  Wrist supination  86 86 90  (Blank rows = not tested)  Active ROM  Right eval Left eval Left 03/02/23 Left 03/23/23  Thumb MCP (0-60)  30 40 32  Thumb IP (0-80)  55 48 60  Thumb Radial abd/add (0-55)       Thumb Palmar abd/add (0-45)       Thumb Opposition to Small Finger       Index MCP (0-90)       Index PIP (0-100)       Index DIP (0-70)        Long MCP (0-90)        Long PIP (0-100)        Long DIP (0-70)        Ring MCP (0-90)        Ring PIP (0-100)        Ring DIP (0-70)        Little MCP (0-90)        Little PIP (0-100)        Little DIP (0-70)        (Blank rows = not tested)  HAND FUNCTION: 03/21/23 Grip strength: Right: 39 lbs; Left: 14 lbs, 3 point pinch: Right: 12 lbs, Left: 5 lbs, and Tip pinch: Right 12 lbs, Left: 6 lbs  COORDINATION: Not assessed due to restrictions s/p surgery  SENSATION: Paresthesias with palpation at scar  EDEMA: swelling in [facility] joint, however per pt has decreased  COGNITION: Overall cognitive status: Within functional limits for tasks assessed    TODAY'S TREATMENT:                                                                                 04/17/23 Therapeutic exercises: Stretch: engaged in wrist flexion/extension, radial/ulnar deviation x10 and thumb IP flexion AROM and PROM x 5. Strength: engaged in wrist flexion/extension x15 each with 1# dumbbell and radial/ulnar deviation x10 initially with dumbbell transitioning to without as pt reporting still having mild pain with ulnar deviation.   03/28/23 Quick Dash: 25% impairment with most difficulty still being with opening a new or tight jar and everything else at a mild or slightly limited score. Therapeutic exercises: grip on resistive ball with focus on stabilization at [facility] joint with squeeze.  Completed wrist flexion/extension on flexible resistance bar, pt reports mild discomfort with wrist extension. Thumb opposition on resistance bar. OT instructing pt in C-strength with and without resistance from rubberband.  Pt demonstrating decreased extension, maintaining thumb in depressed position even with attempts at extension. Manual therapy: OT completing joint mobilizations and facilitating stretch in thenar eminence and at [facility] joint to facility increased thumb positioning and mobility.  OT maintaining stretch position with no c/o pain.  Pt with mild increased ability to achieve position post stretch.    03/23/23 Therapeutic exercise: Prayer stretch with focus on wrist extension within pain tolerance.  Pt reports mild increased pain but not intolerable.  Engaged in thumb stretch downward to further facilitate composite flexion especially at MP joint.  Towel roll grip with focus on sustained grasp.  OT educated on index and long finger abduction and C-strength with and without rubber band.  Pt unable to complete C-strength with rubber band, therefore educated on positioning and technique and encouraged stretch at thumb  in "C" position.    03/21/23 Manual therapy: OT completing joint mobilizations and facilitating stretch in thenar eminence to facility increased thumb positioning and  mobility.  Pt reporting no pain with massage and mobilizations this session. Grip strength: Right: 39 lbs; Left: 14 lbs, 3 point pinch: Right: 12 lbs, Left: 5 lbs, and Tip pinch: Right 12 lbs, Left: 6 lbs. Therapeutic exercise: OT instructed pt with demonstration and verbal cues in Seated Wrist Flexion Stretch, Wrist Prayer Stretch, Stretch Thumb DOWNWARD, Thumb Webspace Stretch, and Towel Roll Grip with Forearm in Neutral.  Pt reports mild pain with wrist flexion and demonstrating difficulty with wrist extension on L during prayer stretch.     PATIENT EDUCATION: Education details: Increased focus on stable [facility] joint and massage Person educated: Patient Education method: Explanation, Demonstration, Verbal cues, and Handouts Education comprehension: verbalized understanding and returned demonstration  HOME EXERCISE PROGRAM: Access Code: [email or web address] URL: [email or web address] Date: 04/17/2023 Prepared by: [facility] - Outpatient  Rehab - [facility]  Program Notes AM and PM do the "stretch" exercises to address range of motion.Midday do the strengthening exercises (putty, rubber band, dumbbell) for strength.If you are having a bad day skip the strengthening exercises but still try to do the "stretch" exercises.  Exercises - Seated Wrist Flexion Stretch  - 2 x daily - 5 reps - 15 hold - Wrist Prayer Stretch  - 2 x daily - 5 reps - 15 sec hold - Seated Wrist Radial and Ulnar Deviation AROM  - 2 x daily - 5 reps - 3-5 sec hold - Seated Thumb IP Flexion AROM with Blocking  - 2 x daily - 10 reps - 3-5 sec hold - Thumb AROM Opposition To All Fingers  - 2 x daily - 10 reps - Seated Thumb Composite Flexion AROM  - 2 x daily - 10 reps - Stretch Thumb DOWNWARD  - 2 x daily - 3 reps - 15 sec hold - Thumb Webspace Stretch  - 2 x daily - 3 reps - 15 sec hold - Seated Wrist Extension with Dumbbell  - 1 x daily - 10 reps - 3 sec hold - Seated Wrist Flexion with Dumbbell  - 1 x daily - 10  reps - 3 sec hold - Seated Wrist Radial Deviation with Dumbbell  - 1 x daily - 10 reps - Thumb Opposition with Putty  - 1 x daily - 10 reps - Thumb Abduction/Adduction Strengthening With Putty  - 1 x daily - 5 reps - Towel Roll Grip with Forearm in Neutral  - 1 x daily - 5 reps - 10 sec hold - Spread Index Finger Apart  - 1 x daily - 5 reps - 5 sec hold - C-Strength (try using rubber band)   - 1 x daily - 5 reps - 5 sec hold  GOALS: Goals reviewed with patient? Yes  SHORT TERM GOALS: Target date: 03/03/23  Pt will report decreased pain level by 50%. Baseline: Goal status: MET - 03/02/23  2.  Pt will improve composite thumb flexion by 20-30 degrees. Baseline: MCP 30, IP 55 Goal status: NOT MET - +8* on 03/02/23  3.  Pt will improve active wrist flexion by 15-20 degrees, and extension by 10-15 degrees. Baseline: flexion 56, extension 48 Goal status: NOT MET - +10* on 03/02/23  4.  Patient will be able to utilize Left hand as normal assist during ADLs. Baseline:  Goal status: NOT MET - significantly improved but still not utilizing as normal assist (  particularly with pants up/down) 03/02/23   LONG TERM GOALS: Target date: 03/24/23  Pt will regain sufficient hand/wrist strength for home maintenance tasks, and leisure activities. Baseline:  Goal status: NOT MET  2.  Patient will gradually return to her normal activity level without pain. Baseline:  Goal status: NOT MET  3.  Pt will report improved functional use of LUE as evidenced by improved score on Quick DASH to <35% impairment. Baseline: 56.8% Goal status: IN PROGRESS  NEW LONG TERM GOALS: Target date: 04/21/23  Pt will regain sufficient hand/wrist strength for home maintenance tasks, and leisure activities. Baseline: Grip strength: Right: 39 lbs; Left: 14 lbs, 3 point pinch: Right: 12 lbs, Left: 5 lbs, and Tip pinch: Right 12 lbs, Left: 6 lbs Goal status: IN PROGRESS  2. Pt will improve active wrist flexion by 10-15 degrees,  and extension by 5-10 degrees. Baseline: flexion 60, extension 54 Goal status: IN PROGRESS  3. Pt will improve composite thumb flexion by 15-20 degrees. Baseline: MCP 32, IP 60 Goal status:IN PROGRESS  4.  Patient will gradually return to her normal activity level without pain. Baseline:  Goal status: IN PROGRESS  5.  Pt will report improved functional use of LUE as evidenced by improved score on Quick DASH to <35% impairment. Baseline: 56.8% Goal status: Met - 25%  ASSESSMENT:  CLINICAL IMPRESSION: Patient is 15 weeks, 4 days (Surgery 12/29/22) from Left thumb trapeziectomy with internal brace suspensioplasty and partial trapezoidectomy. Pt continues to c/o pain around [facility] joint, especially with radial deviation with 1# dumbbell.  Pt verbalizing frustration with not being back to baseline but feeling better after seeing Dr. Frazier Butt with discussion about current status and expectations post surgery.  OT reviewed all exercises administered and encouraged pt to focus on ROM and intersperse strengthening in as tolerated but to continue to prioritize ROM, especially in regards to thumb mobility.  Discussed improvements in ROM s/p surgery as well as decrease of pain since prior to surgery and resumption of majority of routine tasks with minimal onset of pain.  Pt to attempt to engage in HEP as recommended.  At this time, skilled occupational therapy services are no longer indicated. Pt to defer?for ~4 weeks to determine continued necessity of skilled OT services. Pt encouraged to call back before that time with any changes or if any relevant functional deficits develop/occur. Pt to be discharged from OP OT if no further therapy is warranted.  PERFORMANCE DEFICITS: in functional skills including ADLs, IADLs, coordination, dexterity, sensation, edema, ROM, strength, pain, flexibility, and UE functional use.  IMPAIRMENTS: are limiting patient from ADLs, IADLs, and leisure.   COMORBIDITIES: may have  co-morbidities  that affects occupational performance. Patient will benefit from skilled OT to address above impairments and improve overall function.  MODIFICATION OR ASSISTANCE TO COMPLETE EVALUATION: No modification of tasks or assist necessary to complete an evaluation.  OT OCCUPATIONAL PROFILE AND HISTORY: Problem focused assessment: Including review of records relating to presenting problem.  CLINICAL DECISION MAKING: LOW - limited treatment options, no task modification necessary  REHAB POTENTIAL: Good  EVALUATION COMPLEXITY: Low      PLAN:  OT FREQUENCY: 1x/week   OT DURATION: 4 weeks  PLANNED INTERVENTIONS: therapeutic exercise, neuromuscular re-education, manual therapy, scar mobilization, passive range of motion, splinting, moist heat, cryotherapy, and patient/family education  RECOMMENDED OTHER SERVICES: NA  CONSULTED AND AGREED WITH PLAN OF CARE: Patient  PLAN FOR NEXT SESSION: D/c if not heard back from in ~30 days.  Rosalio Loud, OTR/L 04/17/2023, 4:28 PM   OCCUPATIONAL THERAPY DISCHARGE SUMMARY  Visits from Start of Care: 11  Current functional level related to goals / functional outcomes: See above for last know level as pt has not returned or made contact to resume therapy sessions.  At last session - "improvements in ROM s/p surgery as well as decrease of pain since prior to surgery and resumption of majority of routine tasks with minimal onset of pain. "   Remaining deficits: Decreased thumb ROM and c/o pain around [facility] joint - however much improved from eval   Education / Equipment: PROM/AROM, strengthening HEP, use of modalities for pain relief and pre/post exercise   Patient agrees to discharge. Patient goals were partially met. Patient is being discharged due to not returning since the last visit.Marland Kitchen   Rosalio Loud, OT 06/14/23

## 2023-04-20 ENCOUNTER — Telehealth (HOSPITAL_BASED_OUTPATIENT_CLINIC_OR_DEPARTMENT_OTHER): Payer: Self-pay | Admitting: Family Medicine

## 2023-04-20 ENCOUNTER — Ambulatory Visit (HOSPITAL_BASED_OUTPATIENT_CLINIC_OR_DEPARTMENT_OTHER)
Admission: RE | Admit: 2023-04-20 | Discharge: 2023-04-20 | Disposition: A | Discharge status: Home / Self Care | Payer: Medicare Other | Source: Ambulatory Visit | Attending: Family Medicine | Admitting: Family Medicine

## 2023-04-20 ENCOUNTER — Other Ambulatory Visit (HOSPITAL_BASED_OUTPATIENT_CLINIC_OR_DEPARTMENT_OTHER): Payer: Self-pay | Admitting: Family Medicine

## 2023-04-20 ENCOUNTER — Other Ambulatory Visit (HOSPITAL_BASED_OUTPATIENT_CLINIC_OR_DEPARTMENT_OTHER): Payer: Self-pay

## 2023-04-20 DIAGNOSIS — R1011 Right upper quadrant pain: Secondary | ICD-10-CM | POA: Diagnosis not present

## 2023-04-20 MED ORDER — DONEPEZIL HCL 5 MG PO TABS
5.0000 mg | ORAL_TABLET | Freq: Two times a day (BID) | ORAL | 1 refills | Status: DC
  Filled 2023-04-21: qty 180, 90d supply, fill #0
  Filled 2023-07-20: qty 180, 90d supply, fill #1

## 2023-04-20 MED ORDER — METFORMIN HCL 500 MG PO TABS
ORAL_TABLET | ORAL | 0 refills | Status: DC
  Filled 2023-04-20: qty 90, 45d supply, fill #0

## 2023-04-20 NOTE — Telephone Encounter (Signed)
Pt lvm that she needs a refill on  donepezil (ARICEPT) 5 MG tablet

## 2023-04-20 NOTE — Telephone Encounter (Signed)
Medication sent in. 

## 2023-04-21 ENCOUNTER — Other Ambulatory Visit (HOSPITAL_BASED_OUTPATIENT_CLINIC_OR_DEPARTMENT_OTHER): Payer: Self-pay

## 2023-04-24 ENCOUNTER — Ambulatory Visit: Payer: Medicare Other | Admitting: Occupational Therapy

## 2023-05-01 ENCOUNTER — Other Ambulatory Visit (HOSPITAL_BASED_OUTPATIENT_CLINIC_OR_DEPARTMENT_OTHER): Payer: Self-pay | Admitting: Nurse Practitioner

## 2023-05-03 ENCOUNTER — Other Ambulatory Visit (HOSPITAL_BASED_OUTPATIENT_CLINIC_OR_DEPARTMENT_OTHER): Payer: Self-pay | Admitting: Nurse Practitioner

## 2023-05-03 ENCOUNTER — Other Ambulatory Visit (HOSPITAL_BASED_OUTPATIENT_CLINIC_OR_DEPARTMENT_OTHER): Payer: Self-pay

## 2023-05-06 ENCOUNTER — Other Ambulatory Visit (HOSPITAL_COMMUNITY): Payer: Self-pay

## 2023-05-06 ENCOUNTER — Other Ambulatory Visit (HOSPITAL_BASED_OUTPATIENT_CLINIC_OR_DEPARTMENT_OTHER): Payer: Self-pay

## 2023-05-14 ENCOUNTER — Other Ambulatory Visit (HOSPITAL_BASED_OUTPATIENT_CLINIC_OR_DEPARTMENT_OTHER): Payer: Self-pay | Admitting: Family Medicine

## 2023-05-14 ENCOUNTER — Other Ambulatory Visit (HOSPITAL_BASED_OUTPATIENT_CLINIC_OR_DEPARTMENT_OTHER): Payer: Self-pay

## 2023-05-16 ENCOUNTER — Encounter (HOSPITAL_BASED_OUTPATIENT_CLINIC_OR_DEPARTMENT_OTHER): Payer: Self-pay | Admitting: Family Medicine

## 2023-05-16 ENCOUNTER — Ambulatory Visit (INDEPENDENT_AMBULATORY_CARE_PROVIDER_SITE_OTHER): Payer: Medicare Other | Admitting: Family Medicine

## 2023-05-16 ENCOUNTER — Other Ambulatory Visit (HOSPITAL_BASED_OUTPATIENT_CLINIC_OR_DEPARTMENT_OTHER): Payer: Self-pay | Admitting: Family Medicine

## 2023-05-16 ENCOUNTER — Ambulatory Visit (INDEPENDENT_AMBULATORY_CARE_PROVIDER_SITE_OTHER): Payer: Medicare Other

## 2023-05-16 ENCOUNTER — Other Ambulatory Visit (HOSPITAL_BASED_OUTPATIENT_CLINIC_OR_DEPARTMENT_OTHER): Payer: Self-pay

## 2023-05-16 VITALS — BP 146/67 | HR 61 | Ht 63.5 in | Wt 140.0 lb

## 2023-05-16 DIAGNOSIS — R109 Unspecified abdominal pain: Secondary | ICD-10-CM

## 2023-05-16 DIAGNOSIS — R0781 Pleurodynia: Secondary | ICD-10-CM | POA: Diagnosis not present

## 2023-05-16 DIAGNOSIS — R69 Illness, unspecified: Secondary | ICD-10-CM | POA: Diagnosis not present

## 2023-05-16 NOTE — Progress Notes (Unsigned)
Established Patient Office Visit  Subjective   Patient ID: Wendy Joyce, female    DOB: [birth year]  Age: 77 y.o. MRN: [id number]  Wendy Joyce is a 76 yo female patient who presents for follow-up for her abdominal pain.   Her lab work from 04/09/2023 showed acceptable range of liver and pancreatic enzymes. She had a RUQ [place] completed 04/20/2023 which was negative for gallbladder issues.   She reports that the pain in her right "side" is still there. She notices is with movement and does not seem to be associated with eating. She does have a history of IBS- mixed. She reports that she has a "regular" bowel movement in the mornings and then, throughout the day, she becomes more constipated. She reports that her bowel movements can become "weird: during the day- sometimes small hard balls and sometimes soft/stringy/mucus/ribbon-like. She reports she has a lot of intermittent abdominal cramping, not necessarily when she goes to the bathroom or when she consumes a meal. Denies change in bowel habits, blood in stool, watery diarrhea.  Reports that she did fall in 2021 and hit the right side of her rib cage on the side of her counter when falling. She reports that she did not get an x-ray after this injury. She reports that she felt as if she had a concussion and her previous PCP sent her to neurology and diagnosed her with a concussion. She is unsure if the doctor missed a broken rib or injury.    Review of Systems  Constitutional:  Negative for chills and fever.  Eyes:  Negative for blurred vision and double vision.  Respiratory:  Negative for cough and shortness of breath.   Cardiovascular:  Negative for chest pain and palpitations.  Gastrointestinal:  Positive for constipation and diarrhea. Negative for abdominal pain, blood in stool, melena, nausea and vomiting.  Genitourinary:  Negative for frequency, hematuria and urgency.  Musculoskeletal:  Negative for myalgias.  Neurological:   Negative for dizziness, weakness and headaches.  Psychiatric/Behavioral:  Negative for depression, substance abuse and suicidal ideas. The patient is not nervous/anxious.       Objective:    BP (!) 146/67 Comment: Repeat BP  Pulse 61   Ht 5' 3.5" (1.613 m)   Wt 140 lb (63.5 kg)   SpO2 100%   BMI 24.41 kg/m  BP Readings from Last 3 Encounters:  05/16/23 (!) 146/67  04/13/23 112/64  04/12/23 112/68     Physical Exam Constitutional:      Appearance: Normal appearance.  Cardiovascular:     Rate and Rhythm: Normal rate and regular rhythm.     Pulses: Normal pulses.     Heart sounds: Normal heart sounds.  Pulmonary:     Effort: Pulmonary effort is normal.     Breath sounds: Normal breath sounds.  Chest:     Chest wall: Tenderness present. No mass or deformity.    Neurological:     Mental Status: She is alert.  Psychiatric:        Mood and Affect: Mood normal.        Behavior: Behavior normal.        Thought Content: Thought content normal.        Judgment: Judgment normal.    Assessment & Plan:  1. Right lateral abdominal pain Patient presents with continued pain on the right side of her lateral abdomen. She describes this pain as a dull, aching that is intermittent. Denies fever, chills, unexplained weight loss, changes  in bowel habits, intractable vomiting, dysuria, urinary frequency, hematuria, diarrhea, or syncopal episodes.  Denies pain associated with eating or food aversion. Denies pain relieved with bowel movement. Based on her medical history, she does have a history of IBS and has seen GI in the past. Physical exam reveals tenderness to palpation present along inferior portion of lower rib cage. Bowel sounds hyperactive. No abdominal tenderness in any of the four quadrants, no rebound tenderness and no guarding. Will order x-ray of ribs to see if she had a previous fracture that did not heal correctly. Feel this pain may be musculoskeletal in etiology. Advised patient  that if x-ray is unremarkable, would be reasonable to have further evaluation and management with GI specialist. She is agreeable to plan.  - DG Ribs Bilateral   Return if symptoms worsen or fail to improve.    Alyson Reedy, FNP

## 2023-05-17 ENCOUNTER — Other Ambulatory Visit (HOSPITAL_BASED_OUTPATIENT_CLINIC_OR_DEPARTMENT_OTHER): Payer: Self-pay

## 2023-05-17 DIAGNOSIS — R109 Unspecified abdominal pain: Secondary | ICD-10-CM | POA: Insufficient documentation

## 2023-05-17 MED ORDER — MONTELUKAST SODIUM 10 MG PO TABS
10.0000 mg | ORAL_TABLET | Freq: Every day | ORAL | 0 refills | Status: DC
  Filled 2023-05-22: qty 90, 90d supply, fill #0

## 2023-05-22 ENCOUNTER — Other Ambulatory Visit: Payer: Self-pay

## 2023-05-22 ENCOUNTER — Other Ambulatory Visit (HOSPITAL_BASED_OUTPATIENT_CLINIC_OR_DEPARTMENT_OTHER): Payer: Self-pay

## 2023-05-23 ENCOUNTER — Other Ambulatory Visit (HOSPITAL_BASED_OUTPATIENT_CLINIC_OR_DEPARTMENT_OTHER): Payer: Self-pay | Admitting: Family Medicine

## 2023-05-23 ENCOUNTER — Other Ambulatory Visit: Payer: Self-pay

## 2023-05-23 ENCOUNTER — Other Ambulatory Visit (HOSPITAL_BASED_OUTPATIENT_CLINIC_OR_DEPARTMENT_OTHER): Payer: Self-pay

## 2023-05-23 DIAGNOSIS — K582 Mixed irritable bowel syndrome: Secondary | ICD-10-CM

## 2023-05-23 DIAGNOSIS — R69 Illness, unspecified: Secondary | ICD-10-CM | POA: Diagnosis not present

## 2023-05-24 ENCOUNTER — Other Ambulatory Visit: Payer: Self-pay

## 2023-05-24 ENCOUNTER — Telehealth: Payer: Self-pay | Admitting: Nurse Practitioner

## 2023-05-24 ENCOUNTER — Other Ambulatory Visit (HOSPITAL_BASED_OUTPATIENT_CLINIC_OR_DEPARTMENT_OTHER): Payer: Self-pay

## 2023-05-24 ENCOUNTER — Ambulatory Visit (AMBULATORY_SURGERY_CENTER): Payer: Medicare Other | Admitting: *Deleted

## 2023-05-24 VITALS — Ht 63.5 in | Wt 145.0 lb

## 2023-05-24 DIAGNOSIS — R1011 Right upper quadrant pain: Secondary | ICD-10-CM

## 2023-05-24 DIAGNOSIS — Z1211 Encounter for screening for malignant neoplasm of colon: Secondary | ICD-10-CM

## 2023-05-24 DIAGNOSIS — K589 Irritable 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: Secondary | ICD-10-CM

## 2023-05-24 MED ORDER — NA SULFATE-K SULFATE-MG SULF 17.5-3.13-1.6 GM/177ML PO SOLN
1.0000 | Freq: Once | ORAL | 0 refills | Status: AC
Start: 2023-05-24 — End: 2023-06-09
  Filled 2023-05-24 – 2023-06-03 (×2): qty 354, 1d supply, fill #0

## 2023-05-24 NOTE — Progress Notes (Signed)
.  Pt's name and DOB verified at the beginning of the pre-visit.  Pt denies any difficulty with ambulating,sitting, laying down or rolling side to side Gave both [facility] main # and MD on call # prior to instructions.  No egg or soy allergy known to patient  No issues known to pt with past sedation with any surgeries or procedures Pt denies having issues being intubated Pt has no issues moving head neck or swallowing No FH of Malignant Hyperthermia Pt is not on diet pills Pt is not on home 02  Pt is not on blood thinners  Pt has frequent issues with constipation RN instructed pt to uses Miralax per bottles instructions a week before prep days. Pt states they will Pt is not on dialysis Pt denise any abnormal heart rhythms  Pt denies any upcoming cardiac testing Pt encouraged to use to use Singlecare or Goodrx to reduce cost  Patient's chart reviewed by Cathlyn Parsons CNRA prior to pre-visit and patient appropriate for the [facility].  Pre-visit completed and red dot placed by patient's name on their procedure day (on provider's schedule).  . Visit in person Pt very nervous about procedure. Provided emotional support and carefully reviewed all instructions and clarified pt's understanding of them. MD RN call and Endo/Colon to be done instead of just Colonoscopy. Date set for 7/23 at 3:30 pm. Pt OK with that.  Pt states she gets "brain fog" when she has Profofal and was worried if she would had to have done the procedure seperate Pt scale weight is 145lb Instructed pt why it is important to and  to call if they have any changes in health or new medications. Directed them to the # given and on instructions.   Pt states they will.  Instructions reviewed with pt and pt states understanding. Instructed to review again prior to procedure. Pt states they will.  Instructions given to pt  and  sent to my chart

## 2023-05-24 NOTE — Telephone Encounter (Signed)
Wendy Joyce, ok to schedule both procedures with Dr. Rhea Belton 07/18/2023. Patient to contact office if symptoms worsen prior to procedure date. THX.

## 2023-05-24 NOTE — Telephone Encounter (Signed)
Pt sated that she is still have RUQ pain.  Pt was in agreement to add on EGD. The schedule is full on the 06/14/2023. Is it ok to scheduled both on 07/18/2023 Please advise

## 2023-05-24 NOTE — Telephone Encounter (Signed)
Wendy Joyce, pls contact patient and verify if she is still having RUQ pain of unclear etiology, abd sono was normal, please add EGD to her 06/14/2023 colonoscopy procedure date with Dr. Rhea Belton if she is willing to do. Let me know outcome. THX.

## 2023-05-24 NOTE — Telephone Encounter (Signed)
Pt was scheduled for an EGD/Colonoscopy on 07/18/2023 with Dr. Rhea Belton. Pt had previsit appointment today and has received instructions for EGD and Colonoscopy on 07/18/2023. Left message for pt to call back

## 2023-05-25 NOTE — Telephone Encounter (Signed)
Spoke with patient & advised her of upcoming appointment date. She has already been given instructions. No further questions.

## 2023-06-02 ENCOUNTER — Other Ambulatory Visit (HOSPITAL_BASED_OUTPATIENT_CLINIC_OR_DEPARTMENT_OTHER): Payer: Self-pay

## 2023-06-03 ENCOUNTER — Other Ambulatory Visit (HOSPITAL_BASED_OUTPATIENT_CLINIC_OR_DEPARTMENT_OTHER): Payer: Self-pay

## 2023-06-05 ENCOUNTER — Other Ambulatory Visit (HOSPITAL_BASED_OUTPATIENT_CLINIC_OR_DEPARTMENT_OTHER): Payer: Self-pay

## 2023-06-05 MED ORDER — PROGESTERONE MICRONIZED 100 MG PO CAPS
100.0000 mg | ORAL_CAPSULE | Freq: Every day | ORAL | 2 refills | Status: DC
  Filled 2023-06-05: qty 60, 30d supply, fill #0
  Filled 2023-06-09: qty 100, 50d supply, fill #0
  Filled 2023-07-26: qty 30, 15d supply, fill #0
  Filled 2023-07-26: qty 70, 35d supply, fill #0

## 2023-06-05 MED ORDER — METFORMIN HCL 500 MG PO TABS
ORAL_TABLET | ORAL | 0 refills | Status: DC
  Filled 2023-06-05: qty 60, 30d supply, fill #0

## 2023-06-06 ENCOUNTER — Other Ambulatory Visit (HOSPITAL_BASED_OUTPATIENT_CLINIC_OR_DEPARTMENT_OTHER): Payer: Self-pay

## 2023-06-07 ENCOUNTER — Other Ambulatory Visit (HOSPITAL_BASED_OUTPATIENT_CLINIC_OR_DEPARTMENT_OTHER): Payer: Self-pay

## 2023-06-07 DIAGNOSIS — E039 Hypothyroidism, unspecified: Secondary | ICD-10-CM | POA: Diagnosis not present

## 2023-06-07 DIAGNOSIS — R799 Abnormal finding of blood chemistry, unspecified: Secondary | ICD-10-CM | POA: Diagnosis not present

## 2023-06-07 DIAGNOSIS — R790 Abnormal level of blood mineral: Secondary | ICD-10-CM | POA: Diagnosis not present

## 2023-06-07 DIAGNOSIS — R946 Abnormal results of thyroid function studies: Secondary | ICD-10-CM | POA: Diagnosis not present

## 2023-06-07 DIAGNOSIS — R4189 Other symptoms and signs involving cognitive functions and awareness: Secondary | ICD-10-CM | POA: Diagnosis not present

## 2023-06-07 DIAGNOSIS — T56894A Toxic effect of other metals, undetermined, initial encounter: Secondary | ICD-10-CM | POA: Diagnosis not present

## 2023-06-07 DIAGNOSIS — E063 Autoimmune thyroiditis: Secondary | ICD-10-CM | POA: Diagnosis not present

## 2023-06-07 DIAGNOSIS — E612 Magnesium deficiency: Secondary | ICD-10-CM | POA: Diagnosis not present

## 2023-06-07 DIAGNOSIS — E782 Mixed hyperlipidemia: Secondary | ICD-10-CM | POA: Diagnosis not present

## 2023-06-07 MED ORDER — PROGESTERONE MICRONIZED 100 MG PO CAPS
100.0000 mg | ORAL_CAPSULE | Freq: Every day | ORAL | 1 refills | Status: DC
  Filled 2023-06-07: qty 40, 40d supply, fill #0
  Filled 2023-06-08: qty 10, 10d supply, fill #0
  Filled 2023-06-08: qty 90, 90d supply, fill #0
  Filled 2023-06-08: qty 80, 80d supply, fill #0
  Filled 2023-07-26: qty 90, 90d supply, fill #1

## 2023-06-08 ENCOUNTER — Other Ambulatory Visit (HOSPITAL_BASED_OUTPATIENT_CLINIC_OR_DEPARTMENT_OTHER): Payer: Self-pay

## 2023-06-09 ENCOUNTER — Other Ambulatory Visit (HOSPITAL_BASED_OUTPATIENT_CLINIC_OR_DEPARTMENT_OTHER): Payer: Self-pay

## 2023-06-09 DIAGNOSIS — R69 Illness, unspecified: Secondary | ICD-10-CM | POA: Diagnosis not present

## 2023-06-12 ENCOUNTER — Other Ambulatory Visit (HOSPITAL_BASED_OUTPATIENT_CLINIC_OR_DEPARTMENT_OTHER): Payer: Self-pay

## 2023-06-12 MED ORDER — FLUCONAZOLE 200 MG PO TABS
ORAL_TABLET | ORAL | 0 refills | Status: AC
  Filled 2023-06-12: qty 15, 20d supply, fill #0

## 2023-06-12 MED ORDER — PROGESTERONE MICRONIZED 100 MG PO CAPS
100.0000 mg | ORAL_CAPSULE | Freq: Every day | ORAL | 1 refills | Status: DC
  Filled 2023-06-12: qty 90, 90d supply, fill #0

## 2023-06-13 ENCOUNTER — Other Ambulatory Visit (HOSPITAL_BASED_OUTPATIENT_CLINIC_OR_DEPARTMENT_OTHER): Payer: Self-pay

## 2023-06-14 ENCOUNTER — Encounter: Payer: Medicare Other | Admitting: Internal Medicine

## 2023-06-14 ENCOUNTER — Other Ambulatory Visit (HOSPITAL_BASED_OUTPATIENT_CLINIC_OR_DEPARTMENT_OTHER): Payer: Self-pay

## 2023-06-14 MED ORDER — PROGESTERONE MICRONIZED 100 MG PO CAPS
100.0000 mg | ORAL_CAPSULE | Freq: Every day | ORAL | 1 refills | Status: DC
  Filled 2023-06-14: qty 90, 45d supply, fill #0
  Filled 2023-08-30 – 2023-08-31 (×2): qty 90, 90d supply, fill #0
  Filled 2023-09-02: qty 20, 20d supply, fill #0
  Filled 2023-09-02: qty 70, 70d supply, fill #0
  Filled 2023-12-06: qty 90, 45d supply, fill #1

## 2023-06-16 ENCOUNTER — Other Ambulatory Visit (HOSPITAL_BASED_OUTPATIENT_CLINIC_OR_DEPARTMENT_OTHER): Payer: Self-pay

## 2023-06-27 ENCOUNTER — Ambulatory Visit (INDEPENDENT_AMBULATORY_CARE_PROVIDER_SITE_OTHER): Payer: Self-pay | Admitting: Surgical

## 2023-06-27 DIAGNOSIS — Z719 Counseling, unspecified: Secondary | ICD-10-CM

## 2023-06-27 NOTE — Progress Notes (Signed)
Botulinum Toxin Procedure Note  Procedure: Cosmetic botulinum toxin  Pre-operative Diagnosis: Dynamic rhytides  Post-operative Diagnosis: Same  Complications:  None  Brief history: The patient desires botulinum toxin injection.  She is aware of the risks including bleeding, damage to deeper structures, asymmetry, brow ptosis, eyelid ptosis, bruising. The patient understands and wishes to proceed.  Procedure: The area was prepped with alcohol and dried with a clean gauze.  Using a clean technique the botulinum toxin was diluted with 2.5 mL of bacteriostatic saline per 100 unit vial which resulted in 4 units per 0.1 mL.  Subsequently the mixture was injected in the glabellar, lateral canthal lines, forehead area with preservation of the temporal branch to the lateral eyebrow. A total of 30 Units of botulinum toxin was used. The forehead and glabellar area was injected with care to inject intramuscular only while holding pressure on the supratrochlear vessels in each area during each injection on either side of the medial corrugators. The injection proceeded vertically superiorly to the medial 2/3 of the frontalis muscle and superior 2/3 of the lateral frontalis, again with preservation of the frontal branch.  No complications were noted. Light pressure was held for 5 minutes. She was instructed explicitly in post-operative care.  The forehead was injected in a standard M shaped pattern, the glabella was injected in a standard pattern, the lateral canthus was injected adjacent to the lateral canthus and extending inferior and superiorly for 1 injection site.  Botox LOT:  [id number] EXP:  04/2025

## 2023-07-04 ENCOUNTER — Other Ambulatory Visit (HOSPITAL_BASED_OUTPATIENT_CLINIC_OR_DEPARTMENT_OTHER): Payer: Self-pay

## 2023-07-05 ENCOUNTER — Other Ambulatory Visit (HOSPITAL_COMMUNITY): Payer: Self-pay

## 2023-07-05 DIAGNOSIS — R69 Illness, unspecified: Secondary | ICD-10-CM | POA: Diagnosis not present

## 2023-07-05 DIAGNOSIS — H02881 Meibomian gland dysfunction right upper eyelid: Secondary | ICD-10-CM | POA: Diagnosis not present

## 2023-07-05 DIAGNOSIS — H04561 Stenosis of right lacrimal punctum: Secondary | ICD-10-CM | POA: Diagnosis not present

## 2023-07-05 DIAGNOSIS — H04563 Stenosis of bilateral lacrimal punctum: Secondary | ICD-10-CM | POA: Diagnosis not present

## 2023-07-05 DIAGNOSIS — H04562 Stenosis of left lacrimal punctum: Secondary | ICD-10-CM | POA: Diagnosis not present

## 2023-07-05 DIAGNOSIS — H04123 Dry eye syndrome of bilateral lacrimal glands: Secondary | ICD-10-CM | POA: Diagnosis not present

## 2023-07-05 DIAGNOSIS — H5712 Ocular pain, left eye: Secondary | ICD-10-CM | POA: Diagnosis not present

## 2023-07-05 MED ORDER — ERYTHROMYCIN 5 MG/GM OP OINT
TOPICAL_OINTMENT | Freq: Every evening | OPHTHALMIC | 1 refills | Status: DC
  Filled 2023-07-05 – 2023-07-06 (×2): qty 3.5, 7d supply, fill #0
  Filled 2023-07-07 – 2023-07-12 (×3): qty 3.5, 7d supply, fill #1

## 2023-07-06 ENCOUNTER — Other Ambulatory Visit (HOSPITAL_COMMUNITY): Payer: Self-pay

## 2023-07-06 ENCOUNTER — Other Ambulatory Visit (HOSPITAL_BASED_OUTPATIENT_CLINIC_OR_DEPARTMENT_OTHER): Payer: Self-pay

## 2023-07-06 MED ORDER — METFORMIN HCL 500 MG PO TABS
ORAL_TABLET | ORAL | 0 refills | Status: DC
  Filled 2023-07-06: qty 90, 30d supply, fill #0

## 2023-07-07 ENCOUNTER — Other Ambulatory Visit (HOSPITAL_BASED_OUTPATIENT_CLINIC_OR_DEPARTMENT_OTHER): Payer: Self-pay

## 2023-07-08 ENCOUNTER — Other Ambulatory Visit (HOSPITAL_BASED_OUTPATIENT_CLINIC_OR_DEPARTMENT_OTHER): Payer: Self-pay

## 2023-07-17 DIAGNOSIS — R69 Illness, unspecified: Secondary | ICD-10-CM | POA: Diagnosis not present

## 2023-07-18 ENCOUNTER — Encounter: Payer: Self-pay | Admitting: Internal Medicine

## 2023-07-18 ENCOUNTER — Ambulatory Visit (AMBULATORY_SURGERY_CENTER): Payer: Medicare Other | Admitting: Internal Medicine

## 2023-07-18 VITALS — BP 115/57 | HR 54 | Temp 97.8°F | Resp 14 | Ht 63.0 in | Wt 145.0 lb

## 2023-07-18 DIAGNOSIS — R1011 Right upper quadrant pain: Secondary | ICD-10-CM | POA: Diagnosis not present

## 2023-07-18 DIAGNOSIS — K2970 Gastritis, unspecified, without bleeding: Secondary | ICD-10-CM | POA: Diagnosis not present

## 2023-07-18 DIAGNOSIS — R140 Abdominal distension (gaseous): Secondary | ICD-10-CM

## 2023-07-18 DIAGNOSIS — Z1211 Encounter for screening for malignant neoplasm of colon: Secondary | ICD-10-CM

## 2023-07-18 DIAGNOSIS — D120 Benign neoplasm of cecum: Secondary | ICD-10-CM | POA: Diagnosis not present

## 2023-07-18 DIAGNOSIS — K2950 Unspecified chronic gastritis without bleeding: Secondary | ICD-10-CM | POA: Diagnosis not present

## 2023-07-18 DIAGNOSIS — K635 Polyp of colon: Secondary | ICD-10-CM | POA: Diagnosis not present

## 2023-07-18 DIAGNOSIS — D122 Benign neoplasm of ascending colon: Secondary | ICD-10-CM

## 2023-07-18 MED ORDER — SODIUM CHLORIDE 0.9 % IV SOLN: 500.0000 mL | Freq: Once | INTRAVENOUS | Status: DC

## 2023-07-18 NOTE — Patient Instructions (Signed)
Handouts on polyps & diverticulosis given to you today.   Await pathology results on polyps removed   Await stomach biopsy results   Resume usual diet and current medications     YOU HAD AN ENDOSCOPIC PROCEDURE TODAY AT THE [facility]:   Refer to the procedure report that was given to you for any specific questions about what was found during the examination.  If the procedure report does not answer your questions, please call your gastroenterologist to clarify.  If you requested that your care partner not be given the details of your procedure findings, then the procedure report has been included in a sealed envelope for you to review at your convenience later.  YOU SHOULD EXPECT: Some feelings of bloating in the abdomen. Passage of more gas than usual.  Walking can help get rid of the air that was put into your GI tract during the procedure and reduce the bloating. If you had a lower endoscopy (such as a colonoscopy or flexible sigmoidoscopy) you may notice spotting of blood in your stool or on the toilet paper. If you underwent a bowel prep for your procedure, you may not have a normal bowel movement for a few days.  Please Note:  You might notice some irritation and congestion in your nose or some drainage.  This is from the oxygen used during your procedure.  There is no need for concern and it should clear up in a day or so.  SYMPTOMS TO REPORT IMMEDIATELY:  Following lower endoscopy (colonoscopy or flexible sigmoidoscopy):  Excessive amounts of blood in the stool  Significant tenderness or worsening of abdominal pains  Swelling of the abdomen that is new, acute  Fever of 100F or higher  Following upper endoscopy (EGD)  Vomiting of blood or coffee ground material  New chest pain or pain under the shoulder blades  Painful or persistently difficult swallowing  New shortness of breath  Fever of 100F or higher  Black, tarry-looking stools  For urgent or  emergent issues, a gastroenterologist can be reached at any hour by calling [phone]. Do not use MyChart messaging for urgent concerns.    DIET:  We do recommend a small meal at first, but then you may proceed to your regular diet.  Drink plenty of fluids but you should avoid alcoholic beverages for 24 hours.  ACTIVITY:  You should plan to take it easy for the rest of today and you should NOT DRIVE or use heavy machinery until tomorrow (because of the sedation medicines used during the test).    FOLLOW UP: Our staff will call the number listed on your records the next business day following your procedure.  We will call around 7:15- 8:00 am to check on you and address any questions or concerns that you may have regarding the information given to you following your procedure. If we do not reach you, we will leave a message.     If any biopsies were taken you will be contacted by phone or by letter within the next 1-3 weeks.  Please call us at [phone] if you have not heard about the biopsies in 3 weeks.    SIGNATURES/CONFIDENTIALITY: You and/or your care partner have signed paperwork which will be entered into your electronic medical record.  These signatures attest to the fact that that the information above on your After Visit Summary has been reviewed and is understood.  Full responsibility of the confidentiality of this discharge information  lies with you and/or your care-partner.

## 2023-07-18 NOTE — Op Note (Signed)
[facility] Patient Name: Wendy Joyce Procedure Date: 07/18/2023 4:00 PM MRN: [id number] Endoscopist: Beverley Fiedler , MD, [id number] Age: 77 Referring MD:  Date of Birth: 10/24/1946 Gender: Female Account #: [id number] Procedure:                Upper GI endoscopy Indications:              Abdominal pain in the right upper quadrant Medicines:                Monitored Anesthesia Care Procedure:                Pre-Anesthesia Assessment:                           - Prior to the procedure, a History and Physical                            was performed, and patient medications and                            allergies were reviewed. The patient's tolerance of                            previous anesthesia was also reviewed. The risks                            and benefits of the procedure and the sedation                            options and risks were discussed with the patient.                            All questions were answered, and informed consent                            was obtained. Prior Anticoagulants: The patient has                            taken no anticoagulant or antiplatelet agents. ASA                            Grade Assessment: II - A patient with mild systemic                            disease. After reviewing the risks and benefits,                            the patient was deemed in satisfactory condition to                            undergo the procedure.                           After obtaining informed consent, the endoscope was  passed under direct vision. Throughout the                            procedure, the patient's blood pressure, pulse, and                            oxygen saturations were monitored continuously. The                            GIF [id number] #[id number] was introduced through the                            mouth, and advanced to the second part of duodenum.                            The upper GI  endoscopy was accomplished without                            difficulty. The patient tolerated the procedure                            well. Scope In: Scope Out: Findings:                 The examined esophagus was normal.                           The entire examined stomach was normal. Biopsies                            were taken with a cold forceps for histology and                            Helicobacter pylori testing.                           The examined duodenum was normal. Complications:            No immediate complications. Estimated Blood Loss:     Estimated blood loss was minimal. Impression:               - Normal esophagus.                           - Normal stomach. Biopsied.                           - Normal examined duodenum. Recommendation:           - Patient has a contact number available for                            emergencies. The signs and symptoms of potential                            delayed complications were discussed with the  patient. Return to normal activities tomorrow.                            Written discharge instructions were provided to the                            patient.                           - Resume previous diet.                           - Continue present medications.                           - Await pathology results.                           - See the other procedure note for documentation of                            additional recommendations. Beverley Fiedler, MD 07/18/2023 4:30:35 PM This report has been signed electronically.

## 2023-07-18 NOTE — Progress Notes (Unsigned)
GASTROENTEROLOGY PROCEDURE H&P NOTE   Primary Care Physician: de [place], Buren Kos, MD    Reason for Procedure:  Episodic right upper quadrant pain and colon cancer screening  Plan:    EGD and colonoscopy  Patient is appropriate for endoscopic procedure(s) in the ambulatory ([facility]) setting.  The nature of the procedure, as well as the risks, benefits, and alternatives were carefully and thoroughly reviewed with the patient. Ample time for discussion and questions allowed. The patient understood, was satisfied, and agreed to proceed.     HPI: Wendy Joyce is a 77 y.o. female who presents for EGD and colonoscopy.  Medical history as below.  Tolerated the prep.  No recent chest pain or shortness of breath.  No abdominal pain today.  Past Medical History:  Diagnosis Date   Abdominal pain, generalized 01/29/2015   Abnormal thyroid function test 02/25/2016   ADHD (attention deficit hyperactivity disorder)    Allergic rhinitis, unspecified 01/29/2015   Allergy    Anemia    DOE   Ankle sprain 03/16/2016   Anxiety 01/29/2015   Arthritis    hands   Chronic kidney disease 2012   kidney stones   Constipation 01/29/2015   Decreased libido 05/02/2018   Disorder of the skin and subcutaneous tissue, unspecified 01/29/2015   DJD (degenerative joint disease)    hand   Dysthymia    Endometriosis    External otitis    Family history of coronary artery disease 05/02/2018   Fatigue due to excessive exertion 02/24/2016   Fever blister    Fibromyalgia 01/29/2015   Frequency of micturition 01/29/2015   Functional dyspepsia 01/29/2015   Gastro-esophageal reflux disease without esophagitis 01/29/2015   Headache(784.0)    High cholesterol    Hyperlipidemia 09/12/2016   Hypersomnia    Hypothyroidism    IBS (irritable bowel syndrome) 05/02/2018   Insomnia    Internal hemorrhoids    Irritable 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 01/29/2015   Labial pain 11/30/2016   Lactose intolerance  12/14/2016   Lactose intolerance in adult 09/12/2016   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   Left ureteral stone    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   Major depressive disorder, single episode, unspecified    Migraine with aura, not intractable, without status migrainosus    Mixed hyperlipidemia    Mouth pain 01/07/2016   Osteoarthritis of hip    Osteopenia determined by x-ray 03/02/2017   Personal history of estrogen therapy    Phlebitis and thrombophlebitis of the leg    Pityriasis alba 01/29/2015   Pure hypercholesterolemia 06/29/2016   Rectum pain 09/12/2016   Recurrent sinus infections 02/24/2016   Rhinosinusitis 01/07/2016   Rosacea    Sinusitis 12/24/2015   Tinnitus of left ear 01/29/2015   Umbilical hernia without obstruction and without gangrene 01/29/2015   Umbilical pain 05/02/2018   Unspecified urinary incontinence 01/07/2016   Urticaria    UTI (urinary tract infection)    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   Vitamin D deficiency     Past Surgical History:  Procedure Laterality Date   BREAST ENHANCEMENT SURGERY     BREAST REDUCTION SURGERY Bilateral 2018   COLONOSCOPY     COLONOSCOPY WITH PROPOFOL N/A 02/25/2013   Procedure: COLONOSCOPY WITH PROPOFOL;  Surgeon: Charolett Bumpers, MD;  Location: [facility];  Service: Endoscopy;  Laterality: N/A;   mini facelift  2000   NASAL SINUS SURGERY     REDUCTION MAMMAPLASTY  ROTATOR CUFF REPAIR     Right   ROTATOR CUFF REPAIR     Left   SEPTOPLASTY WITH ETHMOIDECTOMY, AND MAXILLARY ANTROSTOMY Bilateral 2006   TOTAL ABDOMINAL HYSTERECTOMY     ovaries remain   TUBAL LIGATION     umbicial hernia      Prior to Admission medications   Medication Sig Start Date End Date Taking? Authorizing Provider  cetirizine (ZYRTEC) 10 MG tablet Take 10 mg by mouth daily.   Yes [provider]  donepezil (ARICEPT) 5 MG tablet Take 1 tablet (5 mg total) by mouth 2 (two) times  daily. 04/20/23  Yes Alyson Reedy, FNP  erythromycin ophthalmic ointment Place small amount into both eyes at bedtime. 07/05/23  Yes   levothyroxine (SYNTHROID) 112 MCG tablet Take 1 tablet (112 mcg total) by mouth daily. Patient taking differently: Take 112 mcg by mouth daily. No ton Sunday 03/20/23  Yes de [place], Buren Kos, MD  metFORMIN (GLUCOPHAGE) 500 MG tablet Take 0.5 tablet by mouth daily for 7 days, THEN 0.5 tablet twice daily for 7 days, THEN 1 tablet in AM and 0.5 tablet in PM for 7 days, THEN 1 tablet twice daily. 07/06/23  Yes   montelukast (SINGULAIR) 10 MG tablet Take 1 tablet (10 mg total) by mouth daily for allergies Patient taking differently: Take 10 mg by mouth daily. Every other day 05/17/23 05/17/24 Yes de Peru, Raymond J, MD  Naltrexone HCl, Pain, 4.5 MG CAPS Prescribed by psychiatry for depression. Once a day. 11/10/22  Yes Early, Sung Amabile, NP  zaleplon (SONATA) 5 MG capsule Take 1 capsule (5 mg total) by mouth at bedtime as needed for sleep. 03/03/23  Yes de [place], Raymond J, MD  acyclovir ointment (ZOVIRAX) 5 % Apply 1 application. topically daily as needed (for fever blister).    [provider]  albuterol (VENTOLIN HFA) 108 (90 Base) MCG/ACT inhaler Inhale 2 puffs into the lungs every 6 (six) hours as needed for wheezing or shortness of breath. 05/22/20   Icard, Rachel Bo, DO  ALPRAZolam (XANAX) 0.25 MG tablet Take 1 to 2 tablets by mouth once daily as needed. Must last 30 days. 04/06/22     ALPRAZolam (XANAX) 0.25 MG tablet Take 1-2 tablets (0.25-0.5 mg total) by mouth daily as needed. **Must last 30 days ** Patient not taking: Reported on 05/24/2023 12/21/22     estradiol (ESTRACE) 0.1 MG/GM vaginal cream Insert blueberry size amount of cream on finger (or 0.5 grams with applicator) in vagina daily for 2 weeks, THEN twice weekly. 09/02/22     estradiol (ESTRACE) 0.5 MG tablet Take 1 tablet (0.5 mg total) by mouth daily. 12/29/22   de [place], Buren Kos, MD  progesterone  (PROMETRIUM) 100 MG capsule Take 1-2 capsules (100-200 mg total) by mouth at bedtime. 06/05/23     progesterone (PROMETRIUM) 100 MG capsule Take 1 capsule (100 mg total) by mouth before bedtime for 1 week. May increase to 2 capsules to help with sleep. 06/07/23     progesterone (PROMETRIUM) 100 MG capsule Take 1 capsule (100 mg total) by mouth at bedtime. May increase to 2 capsules (200 mg total) to help with sleep 06/12/23     progesterone (PROMETRIUM) 100 MG capsule Take 1 capsule (100 mg total) by mouth before bedtime for 1 week. May increase to 2 capsules to help with sleep. 06/12/23     valACYclovir (VALTREX) 500 MG tablet Take 1 tablet (500 mg total) by mouth 2 (two) times daily. 04/03/23  buPROPion (WELLBUTRIN XL) 150 MG 24 hr tablet Take 1 tablet (150 mg total) by mouth. 10/18/21 01/12/22    prazosin (MINIPRESS) 2 MG capsule 1 TO 3 PO QHS Patient not taking: Reported on 06/17/2022 03/03/22 08/16/22    Vilazodone HCl (VIIBRYD) 10 MG TABS 1 PO QD W/DINNER 07/12/22 08/16/22  Tollie Eth, NP    Current Outpatient Medications  Medication Sig Dispense Refill   cetirizine (ZYRTEC) 10 MG tablet Take 10 mg by mouth daily.     donepezil (ARICEPT) 5 MG tablet Take 1 tablet (5 mg total) by mouth 2 (two) times daily. 180 tablet 1   erythromycin ophthalmic ointment Place small amount into both eyes at bedtime. 3.5 g 1   levothyroxine (SYNTHROID) 112 MCG tablet Take 1 tablet (112 mcg total) by mouth daily. (Patient taking differently: Take 112 mcg by mouth daily. No ton Sunday) 90 tablet 1   metFORMIN (GLUCOPHAGE) 500 MG tablet Take 0.5 tablet by mouth daily for 7 days, THEN 0.5 tablet twice daily for 7 days, THEN 1 tablet in AM and 0.5 tablet in PM for 7 days, THEN 1 tablet twice daily. 90 tablet 0   montelukast (SINGULAIR) 10 MG tablet Take 1 tablet (10 mg total) by mouth daily for allergies (Patient taking differently: Take 10 mg by mouth daily. Every other day) 90 tablet 0   Naltrexone HCl, Pain, 4.5 MG CAPS  Prescribed by psychiatry for depression. Once a day. 30 capsule 0   zaleplon (SONATA) 5 MG capsule Take 1 capsule (5 mg total) by mouth at bedtime as needed for sleep. 20 capsule 1   acyclovir ointment (ZOVIRAX) 5 % Apply 1 application. topically daily as needed (for fever blister).     albuterol (VENTOLIN HFA) 108 (90 Base) MCG/ACT inhaler Inhale 2 puffs into the lungs every 6 (six) hours as needed for wheezing or shortness of breath. 18 g 5   ALPRAZolam (XANAX) 0.25 MG tablet Take 1 to 2 tablets by mouth once daily as needed. Must last 30 days. 30 tablet 2   ALPRAZolam (XANAX) 0.25 MG tablet Take 1-2 tablets (0.25-0.5 mg total) by mouth daily as needed. **Must last 30 days ** (Patient not taking: Reported on 05/24/2023) 30 tablet 2   estradiol (ESTRACE) 0.1 MG/GM vaginal cream Insert blueberry size amount of cream on finger (or 0.5 grams with applicator) in vagina daily for 2 weeks, THEN twice weekly. 42.5 g 3   estradiol (ESTRACE) 0.5 MG tablet Take 1 tablet (0.5 mg total) by mouth daily. 90 tablet 3   progesterone (PROMETRIUM) 100 MG capsule Take 1-2 capsules (100-200 mg total) by mouth at bedtime. 100 capsule 2   progesterone (PROMETRIUM) 100 MG capsule Take 1 capsule (100 mg total) by mouth before bedtime for 1 week. May increase to 2 capsules to help with sleep. 90 capsule 1   progesterone (PROMETRIUM) 100 MG capsule Take 1 capsule (100 mg total) by mouth at bedtime. May increase to 2 capsules (200 mg total) to help with sleep 90 capsule 1   progesterone (PROMETRIUM) 100 MG capsule Take 1 capsule (100 mg total) by mouth before bedtime for 1 week. May increase to 2 capsules to help with sleep. 90 capsule 1   valACYclovir (VALTREX) 500 MG tablet Take 1 tablet (500 mg total) by mouth 2 (two) times daily. 180 tablet 2   Current Facility-Administered Medications  Medication Dose Route Frequency Provider Last Rate Last Admin   0.9 %  sodium chloride infusion  500 mL Intravenous  Once Beverley Fiedler, MD         Allergies as of 07/18/2023 - Review Complete 07/18/2023  Allergen Reaction Noted   Adhesive [tape] Dermatitis 02/25/2013   Atovaquone-proguanil hcl Other (See Comments) 07/13/2021   Bioflavonoids  06/29/2022   Citrus Nausea And Vomiting 09/26/2013   Emedastine Nausea And Vomiting 05/24/2023   Escitalopram Hives 07/13/2021   Escitalopram oxalate Hives 02/02/2016   Ezetimibe-simvastatin Other (See Comments) 07/13/2021   Niacin Other (See Comments) 07/13/2021   Niacin and related  02/19/2013   Rosuvastatin Other (See Comments) 07/13/2021   Versed [midazolam] Nausea And Vomiting 05/24/2023   Amoxicillin Rash 02/25/2013   Hydrochlorothiazide Rash 07/13/2021    Family History  Problem Relation Age of Onset   Stroke Father    Hypertension Father    Cancer Father    Breast cancer Maternal Grandmother    Colon cancer Neg Hx    Colon polyps Neg Hx    Esophageal cancer Neg Hx    Rectal cancer Neg Hx    Stomach cancer Neg Hx     Social History   Socioeconomic History   Marital status: Married    Spouse name: Not on file   Number of children: Not on file   Years of education: Not on file   Highest education level: Not on file  Occupational History   Not on file  Tobacco Use   Smoking status: Never   Smokeless tobacco: Never  Vaping Use   Vaping status: Never Used  Substance and Sexual Activity   Alcohol use: Yes    Alcohol/week: 2.0 standard drinks of alcohol    Types: 2 Glasses of wine per week    Comment: weekends occ   Drug use: No   Sexual activity: Yes    Partners: Male    Birth control/protection: Post-menopausal  Other Topics Concern   Not on file  Social History Narrative   Married   Regular exercise: yes; 2 x a week with a trainer   Caffeine use: coffee daily   Social Determinants of Health   Financial Resource Strain: Low Risk  (02/28/2023)   Overall Financial Resource Strain (CARDIA)    Difficulty of Paying Living Expenses: Not hard at all   Food Insecurity: No Food Insecurity (02/28/2023)   Hunger Vital Sign    Worried About Running Out of Food in the Last Year: Never true    Ran Out of Food in the Last Year: Never true  Transportation Needs: No Transportation Needs (02/28/2023)   PRAPARE - Administrator, Civil Service (Medical): No    Lack of Transportation (Non-Medical): No  Physical Activity: Inactive (02/28/2023)   Exercise Vital Sign    Days of Exercise per Week: 0 days    Minutes of Exercise per Session: 0 min  Stress: No Stress Concern Present (02/28/2023)   [employer] of Occupational Health - Occupational Stress Questionnaire    Feeling of Stress : Not at all  Social Connections: Socially Integrated (02/28/2023)   Social Connection and Isolation Panel [NHANES]    Frequency of Communication with Friends and Family: More than three times a week    Frequency of Social Gatherings with Friends and Family: More than three times a week    Attends Religious Services: More than 4 times per year    Active Member of [employer] or Organizations: Yes    Attends Engineer, structural: More than 4 times per year    Marital Status: Married  Intimate Partner Violence: Not At Risk (02/28/2023)   Humiliation, Afraid, Rape, and Kick questionnaire    Fear of Current or Ex-Partner: No    Emotionally Abused: No    Physically Abused: No    Sexually Abused: No    Physical Exam: Vital signs in last 24 hours: @BP  122/64   Pulse (!) 54   Temp 97.8 F (36.6 C) (Temporal)   Ht 5\' 3"  (1.6 m)   Wt 145 lb (65.8 kg)   SpO2 100%   BMI 25.69 kg/m  GEN: NAD EYE: Sclerae anicteric ENT: MMM CV: Non-tachycardic Pulm: CTA b/l GI: Soft, NT/ND NEURO:  Alert & Oriented x 3   Erick Blinks, MD [facility]  07/18/2023 3:50 PM

## 2023-07-18 NOTE — Op Note (Signed)
[facility] Patient Name: Wendy Joyce Procedure Date: 07/18/2023 3:48 PM MRN: [id number] Endoscopist: Beverley Fiedler , MD, [id number] Age: 77 Referring MD:  Date of Birth: June 13, 1946 Gender: Female Account #: [id number] Procedure:                Colonoscopy Indications:              Screening for colorectal malignant neoplasm, Last                            colonoscopy 10 years ago Medicines:                Monitored Anesthesia Care Procedure:                Pre-Anesthesia Assessment:                           - Prior to the procedure, a History and Physical                            was performed, and patient medications and                            allergies were reviewed. The patient's tolerance of                            previous anesthesia was also reviewed. The risks                            and benefits of the procedure and the sedation                            options and risks were discussed with the patient.                            All questions were answered, and informed consent                            was obtained. Prior Anticoagulants: The patient has                            taken no anticoagulant or antiplatelet agents. ASA                            Grade Assessment: II - A patient with mild systemic                            disease. After reviewing the risks and benefits,                            the patient was deemed in satisfactory condition to                            undergo the procedure.  After obtaining informed consent, the colonoscope                            was passed under direct vision. Throughout the                            procedure, the patient's blood pressure, pulse, and                            oxygen saturations were monitored continuously. The                            Olympus Scope SN: [id number] was introduced through                            the anus and advanced to the  cecum, identified by                            appendiceal orifice and ileocecal valve. The                            colonoscopy was performed without difficulty. The                            patient tolerated the procedure well. The quality                            of the bowel preparation was good. The ileocecal                            valve, appendiceal orifice, and rectum were                            photographed. Scope In: 4:12:25 PM Scope Out: 4:27:15 PM Scope Withdrawal Time: 0 hours 11 minutes 32 seconds  Total Procedure Duration: 0 hours 14 minutes 50 seconds  Findings:                 The digital rectal exam was normal.                           A 2 mm polyp was found in the cecum. The polyp was                            sessile. The polyp was removed with a cold biopsy                            forceps. Resection and retrieval were complete.                           A 7 mm polyp was found in the ascending colon. The                            polyp was sessile. The  polyp was removed with a                            cold snare. Resection and retrieval were complete.                           Multiple medium-mouthed and small-mouthed                            diverticula were found in the sigmoid colon,                            descending colon and ascending colon.                           The retroflexed view of the distal rectum and anal                            verge was normal and showed no anal or rectal                            abnormalities. Complications:            No immediate complications. Estimated Blood Loss:     Estimated blood loss was minimal. Impression:               - One 2 mm polyp in the cecum, removed with a cold                            biopsy forceps. Resected and retrieved.                           - One 7 mm polyp in the ascending colon, removed                            with a cold snare. Resected and retrieved.                            - Mild to moderate diverticulosis in the sigmoid                            colon, in the descending colon and in the ascending                            colon without inflammation.                           - The distal rectum and anal verge are normal on                            retroflexion view. Recommendation:           - Patient has a contact number available for  emergencies. The signs and symptoms of potential                            delayed complications were discussed with the                            patient. Return to normal activities tomorrow.                            Written discharge instructions were provided to the                            patient.                           - Resume previous diet.                           - Continue present medications.                           - Await pathology results.                           - If persistent RUQ pain then CT scan abd/pelvis                            with contrast is recommended and if negative then                            trial of gabapentin 300 mg qHS.                           - Repeat colonoscopy may be recommended for                            surveillance. The colonoscopy date will be                            determined after pathology results from today's                            exam become available for review. Beverley Fiedler, MD 07/18/2023 4:34:36 PM This report has been signed electronically.

## 2023-07-18 NOTE — Progress Notes (Unsigned)
Pt's states no medical or surgical changes since previsit or office visit. 

## 2023-07-18 NOTE — Progress Notes (Signed)
Sedate, gd SR, tolerated procedure well, VSS, report to RN 

## 2023-07-18 NOTE — Progress Notes (Signed)
Called to room to assist during endoscopic procedure.  Patient ID and intended procedure confirmed with present staff. Received instructions for my participation in the procedure from the performing physician.  

## 2023-07-19 ENCOUNTER — Telehealth: Payer: Self-pay

## 2023-07-19 NOTE — Telephone Encounter (Signed)
  Follow up Call-     07/18/2023    3:05 PM  Call back number  Post procedure Call Back phone  # [phone]  Permission to leave phone message Yes     Patient questions:  Do you have a fever, pain , or abdominal swelling? No. Pain Score  0 *  Have you tolerated food without any problems? Yes.    Have you been able to return to your normal activities? Yes.    Do you have any questions about your discharge instructions: Diet   No. Medications  No. Follow up visit  No.  Do you have questions or concerns about your Care? No.  Actions: * If pain score is 4 or above: No action needed, pain <4.

## 2023-07-20 ENCOUNTER — Other Ambulatory Visit (HOSPITAL_BASED_OUTPATIENT_CLINIC_OR_DEPARTMENT_OTHER): Payer: Self-pay

## 2023-07-20 DIAGNOSIS — H02881 Meibomian gland dysfunction right upper eyelid: Secondary | ICD-10-CM | POA: Diagnosis not present

## 2023-07-20 DIAGNOSIS — H5712 Ocular pain, left eye: Secondary | ICD-10-CM | POA: Diagnosis not present

## 2023-07-20 DIAGNOSIS — H04563 Stenosis of bilateral lacrimal punctum: Secondary | ICD-10-CM | POA: Diagnosis not present

## 2023-07-20 DIAGNOSIS — H04123 Dry eye syndrome of bilateral lacrimal glands: Secondary | ICD-10-CM | POA: Diagnosis not present

## 2023-07-25 ENCOUNTER — Encounter: Payer: Self-pay | Admitting: Internal Medicine

## 2023-07-25 ENCOUNTER — Other Ambulatory Visit (HOSPITAL_BASED_OUTPATIENT_CLINIC_OR_DEPARTMENT_OTHER): Payer: Self-pay

## 2023-07-25 DIAGNOSIS — M271 Giant cell granuloma, central: Secondary | ICD-10-CM | POA: Diagnosis not present

## 2023-07-25 DIAGNOSIS — L958 Other vasculitis limited to the skin: Secondary | ICD-10-CM | POA: Diagnosis not present

## 2023-07-25 DIAGNOSIS — N390 Urinary tract infection, site not specified: Secondary | ICD-10-CM | POA: Diagnosis not present

## 2023-07-25 MED ORDER — NITROFURANTOIN MONOHYD MACRO 100 MG PO CAPS
100.0000 mg | ORAL_CAPSULE | Freq: Two times a day (BID) | ORAL | 0 refills | Status: DC
  Filled 2023-07-25: qty 10, 5d supply, fill #0

## 2023-07-25 MED ORDER — PREDNISONE 20 MG PO TABS
40.0000 mg | ORAL_TABLET | Freq: Two times a day (BID) | ORAL | 0 refills | Status: DC
  Filled 2023-07-25: qty 90, 23d supply, fill #0

## 2023-07-26 ENCOUNTER — Other Ambulatory Visit (HOSPITAL_BASED_OUTPATIENT_CLINIC_OR_DEPARTMENT_OTHER): Payer: Self-pay

## 2023-07-26 ENCOUNTER — Other Ambulatory Visit: Payer: Self-pay

## 2023-07-26 DIAGNOSIS — R1011 Right upper quadrant pain: Secondary | ICD-10-CM

## 2023-07-28 DIAGNOSIS — N3941 Urge incontinence: Secondary | ICD-10-CM | POA: Diagnosis not present

## 2023-07-28 DIAGNOSIS — N3281 Overactive bladder: Secondary | ICD-10-CM | POA: Diagnosis not present

## 2023-07-28 DIAGNOSIS — N393 Stress incontinence (female) (male): Secondary | ICD-10-CM | POA: Diagnosis not present

## 2023-07-31 ENCOUNTER — Other Ambulatory Visit (HOSPITAL_BASED_OUTPATIENT_CLINIC_OR_DEPARTMENT_OTHER): Payer: Self-pay

## 2023-07-31 ENCOUNTER — Telehealth (HOSPITAL_BASED_OUTPATIENT_CLINIC_OR_DEPARTMENT_OTHER): Payer: Medicare Other | Admitting: Family Medicine

## 2023-07-31 MED ORDER — METFORMIN HCL 500 MG PO TABS
ORAL_TABLET | ORAL | 0 refills | Status: DC
  Filled 2023-07-31: qty 60, 30d supply, fill #0

## 2023-07-31 MED ORDER — PAXLOVID (300/100) 20 X 150 MG & 10 X 100MG PO TBPK
ORAL_TABLET | ORAL | 1 refills | Status: DC
  Filled 2023-07-31: qty 30, 5d supply, fill #0
  Filled 2023-08-01 – 2023-08-03 (×3): qty 30, 5d supply, fill #1

## 2023-08-01 ENCOUNTER — Other Ambulatory Visit (HOSPITAL_BASED_OUTPATIENT_CLINIC_OR_DEPARTMENT_OTHER): Payer: Self-pay

## 2023-08-01 DIAGNOSIS — R69 Illness, unspecified: Secondary | ICD-10-CM | POA: Diagnosis not present

## 2023-08-02 ENCOUNTER — Other Ambulatory Visit: Payer: Self-pay

## 2023-08-03 ENCOUNTER — Other Ambulatory Visit (HOSPITAL_BASED_OUTPATIENT_CLINIC_OR_DEPARTMENT_OTHER): Payer: Self-pay

## 2023-08-03 NOTE — Progress Notes (Addendum)
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 (1946/11/07) female presenting at the request of .de [place], Buren Kos, MD with concern for left-sided temporal arteritis.  On exam today, Wendy Joyce was doing well, accompanied by her husband.  Originally from [place], she moved to [place] several years ago.  Wendy Joyce had a fall last month resulting in a small head contusion on the left side.  2 weeks later, she complained of a headache that would not resolve.  The headache was accompanied by jaw pain, erythema across the left portion of her face, some blurry vision bilaterally.  She was seen by her primary care Dr. Ferdinand Cava and started on steroids with concern for temporal arteritis.  Labs were not ordered.  With the administration of corticosteroids roughly 10 days ago, the headache resolved.  Jaw pain improved.  Continues to have some erythema on the left cheek, but denies any worsening of symptoms.  Pertinent surgical history includes facelift 5 years ago in [place] No history of shingles. History of herpes zoster on the left portion of the face for which she takes acyclovir on an infrequent basis   Past Medical History:  Diagnosis Date   Abdominal pain, generalized 01/29/2015   Abnormal thyroid function test 02/25/2016   ADHD (attention deficit hyperactivity disorder)    Allergic rhinitis, unspecified 01/29/2015   Allergy    Anemia    DOE   Ankle sprain 03/16/2016   Anxiety 01/29/2015   Arthritis    hands   Chronic kidney disease 2012   kidney stones   Constipation 01/29/2015   Decreased libido 05/02/2018   Disorder of the skin and subcutaneous tissue, unspecified 01/29/2015   DJD (degenerative joint disease)    hand   Dysthymia    Endometriosis    External otitis    Family history of coronary artery disease 05/02/2018   Fatigue due to excessive exertion 02/24/2016    Fever blister    Fibromyalgia 01/29/2015   Frequency of micturition 01/29/2015   Functional dyspepsia 01/29/2015   Gastro-esophageal reflux disease without esophagitis 01/29/2015   Headache(784.0)    High cholesterol    Hyperlipidemia 09/12/2016   Hypersomnia    Hypothyroidism    IBS (irritable bowel syndrome) 05/02/2018   Insomnia    Internal hemorrhoids    Irritable 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 01/29/2015   Labial pain 11/30/2016   Lactose intolerance 12/14/2016   Lactose intolerance in adult 09/12/2016   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   Left ureteral stone    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   Major depressive disorder, single episode, unspecified    Migraine with aura, not intractable, without status migrainosus    Mixed hyperlipidemia    Mouth pain 01/07/2016   Osteoarthritis of hip    Osteopenia determined by x-ray 03/02/2017   Personal history of estrogen therapy    Phlebitis and thrombophlebitis of the leg    Pityriasis alba 01/29/2015   Pure hypercholesterolemia 06/29/2016   Rectum pain 09/12/2016   Recurrent sinus infections 02/24/2016   Rhinosinusitis 01/07/2016   Rosacea    Sinusitis 12/24/2015   Tinnitus of left ear 01/29/2015   Umbilical hernia without obstruction and without gangrene 01/29/2015   Umbilical pain 05/02/2018   Unspecified urinary incontinence 01/07/2016   Urticaria    UTI (urinary tract infection)    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  Vitamin D deficiency     Past Surgical History:  Procedure Laterality Date   BREAST ENHANCEMENT SURGERY     BREAST REDUCTION SURGERY Bilateral 2018   COLONOSCOPY     COLONOSCOPY WITH PROPOFOL N/A 02/25/2013   Procedure: COLONOSCOPY WITH PROPOFOL;  Surgeon: Charolett Bumpers, MD;  Location: [facility];  Service: Endoscopy;  Laterality: N/A;   mini facelift  2000   NASAL SINUS SURGERY     REDUCTION MAMMAPLASTY     ROTATOR CUFF REPAIR     Right    ROTATOR CUFF REPAIR     Left   SEPTOPLASTY WITH ETHMOIDECTOMY, AND MAXILLARY ANTROSTOMY Bilateral 2006   TOTAL ABDOMINAL HYSTERECTOMY     ovaries remain   TUBAL LIGATION     umbicial hernia      Social History   Socioeconomic History   Marital status: Married    Spouse name: Not on file   Number of children: Not on file   Years of education: Not on file   Highest education level: Not on file  Occupational History   Not on file  Tobacco Use   Smoking status: Never   Smokeless tobacco: Never  Vaping Use   Vaping status: Never Used  Substance and Sexual Activity   Alcohol use: Yes    Alcohol/week: 2.0 standard drinks of alcohol    Types: 2 Glasses of wine per week    Comment: weekends occ   Drug use: No   Sexual activity: Yes    Partners: Male    Birth control/protection: Post-menopausal  Other Topics Concern   Not on file  Social History Narrative   Married   Regular exercise: yes; 2 x a week with a trainer   Caffeine use: coffee daily   Social Determinants of Health   Financial Resource Strain: Low Risk  (02/28/2023)   Overall Financial Resource Strain (CARDIA)    Difficulty of Paying Living Expenses: Not hard at all  Food Insecurity: Low Risk  (07/28/2023)   Received from [facility]   Food vital sign    Within the past 12 months, you worried that your food would run out before you got money to buy more: Never true    Within the past 12 months, the food you bought just didn't last and you didn't have money to get more. : Never true  Transportation Needs: Not on file (07/28/2023)  Physical Activity: Inactive (02/28/2023)   Exercise Vital Sign    Days of Exercise per Week: 0 days    Minutes of Exercise per Session: 0 min  Stress: No Stress Concern Present (02/28/2023)   [employer] of Occupational Health - Occupational Stress Questionnaire    Feeling of Stress : Not at all  Social Connections: Socially Integrated (02/28/2023)   Social Connection and Isolation  Panel [NHANES]    Frequency of Communication with Friends and Family: More than three times a week    Frequency of Social Gatherings with Friends and Family: More than three times a week    Attends Religious Services: More than 4 times per year    Active Member of [employer] or Organizations: Yes    Attends Banker Meetings: More than 4 times per year    Marital Status: Married  Catering manager Violence: Not At Risk (02/28/2023)   Humiliation, Afraid, Rape, and Kick questionnaire    Fear of Current or Ex-Partner: No    Emotionally Abused: No    Physically Abused: No    Sexually  Abused: No   Family History  Problem Relation Age of Onset   Stroke Father    Hypertension Father    Cancer Father    Breast cancer Maternal Grandmother    Colon cancer Neg Hx    Colon polyps Neg Hx    Esophageal cancer Neg Hx    Rectal cancer Neg Hx    Stomach cancer Neg Hx     Current Outpatient Medications  Medication Sig Dispense Refill   acyclovir ointment (ZOVIRAX) 5 % Apply 1 application. topically daily as needed (for fever blister).     albuterol (VENTOLIN HFA) 108 (90 Base) MCG/ACT inhaler Inhale 2 puffs into the lungs every 6 (six) hours as needed for wheezing or shortness of breath. 18 g 5   ALPRAZolam (XANAX) 0.25 MG tablet Take 1 to 2 tablets by mouth once daily as needed. Must last 30 days. 30 tablet 2   ALPRAZolam (XANAX) 0.25 MG tablet Take 1-2 tablets (0.25-0.5 mg total) by mouth daily as needed. **Must last 30 days ** (Patient not taking: Reported on 05/24/2023) 30 tablet 2   cetirizine (ZYRTEC) 10 MG tablet Take 10 mg by mouth daily.     donepezil (ARICEPT) 5 MG tablet Take 1 tablet (5 mg total) by mouth 2 (two) times daily. 180 tablet 1   erythromycin ophthalmic ointment Place small amount into both eyes at bedtime. 3.5 g 1   estradiol (ESTRACE) 0.1 MG/GM vaginal cream Insert blueberry size amount of cream on finger (or 0.5 grams with applicator) in vagina daily for 2 weeks, THEN  twice weekly. 42.5 g 3   estradiol (ESTRACE) 0.5 MG tablet Take 1 tablet (0.5 mg total) by mouth daily. 90 tablet 3   levothyroxine (SYNTHROID) 112 MCG tablet Take 1 tablet (112 mcg total) by mouth daily. (Patient taking differently: Take 112 mcg by mouth daily. No ton Sunday) 90 tablet 1   metFORMIN (GLUCOPHAGE) 500 MG tablet Take 0.5 tablet by mouth daily for 7 days, THEN 0.5 tablet twice daily for 7 days, THEN 1 tablet in AM and 0.5 tablet in PM for 7 days, THEN 1 tablet twice daily. 90 tablet 0   montelukast (SINGULAIR) 10 MG tablet Take 1 tablet (10 mg total) by mouth daily for allergies (Patient taking differently: Take 10 mg by mouth daily. Every other day) 90 tablet 0   Naltrexone HCl, Pain, 4.5 MG CAPS Prescribed by psychiatry for depression. Once a day. 30 capsule 0   nirmatrelvir & ritonavir (PAXLOVID, 300/100,) 20 x 150 MG & 10 x 100MG  TBPK Take as directed on packaging instructions 30 each 1   nitrofurantoin, macrocrystal-monohydrate, (MACROBID) 100 MG capsule Take 1 capsule (100 mg total) by mouth 2 (two) times daily. 10 capsule 0   predniSONE (DELTASONE) 20 MG tablet Take 2 tablets (40 mg total) by mouth 2 (two) times daily. 90 tablet 0   progesterone (PROMETRIUM) 100 MG capsule Take 1-2 capsules (100-200 mg total) by mouth at bedtime. 100 capsule 2   progesterone (PROMETRIUM) 100 MG capsule Take 1 capsule (100 mg total) by mouth before bedtime for 1 week. May increase to 2 capsules to help with sleep. 90 capsule 1   valACYclovir (VALTREX) 500 MG tablet Take 1 tablet (500 mg total) by mouth 2 (two) times daily. 180 tablet 2   zaleplon (SONATA) 5 MG capsule Take 1 capsule (5 mg total) by mouth at bedtime as needed for sleep. 20 capsule 1   No current facility-administered medications for this visit.  Allergies  Allergen Reactions   Adhesive [Tape] Dermatitis   Atovaquone-Proguanil Hcl Other (See Comments)   Bioflavonoids    Citrus Nausea And Vomiting   Emedastine Nausea And  Vomiting   Escitalopram Hives   Escitalopram Oxalate Hives   Ezetimibe-Simvastatin Other (See Comments)   Niacin Other (See Comments)   Niacin And Related     Rash and breathing breathing problems   Rosuvastatin Other (See Comments)   Versed [Midazolam] Nausea And Vomiting    For a week per pt   Amoxicillin Rash    Unsure of reaction/ was instructed not to take drug. Unknown reaction    Hydrochlorothiazide Rash     REVIEW OF SYSTEMS:  [X]  denotes positive finding, [ ]  denotes negative finding Cardiac  Comments:  Chest pain or chest pressure:    Shortness of breath upon exertion:    Short of breath when lying flat:    Irregular heart rhythm:        Vascular    Pain in calf, thigh, or hip brought on by ambulation:    Pain in feet at night that wakes you up from your sleep:     Blood clot in your veins:    Leg swelling:         Pulmonary    Oxygen at home:    Productive cough:     Wheezing:         Neurologic    Sudden weakness in arms or legs:     Sudden numbness in arms or legs:     Sudden onset of difficulty speaking or slurred speech:    Temporary loss of vision in one eye:     Problems with dizziness:         Gastrointestinal    Blood in stool:     Vomited blood:         Genitourinary    Burning when urinating:     Blood in urine:        Psychiatric    Major depression:         Hematologic    Bleeding problems:    Problems with blood clotting too easily:        Skin    Rashes or ulcers:        Constitutional    Fever or chills:      PHYSICAL EXAMINATION:  There were no vitals filed for this visit.  General:  WDWN in NAD; vital signs documented above Gait: Not observed HENT: WNL, normocephalic Pulmonary: normal non-labored breathing , without wheezing Cardiac: regular HR Abdomen: soft, NT, no masses Skin: without rashes Vascular Exam/Pulses:  Right Left  Radial 2+ (normal) 2+ (normal)  Ulnar                     Extremities:  without ischemic changes, without Gangrene , without cellulitis; without open wounds;  Musculoskeletal: no muscle wasting or atrophy  Neurologic: A&O X 3;  No focal weakness or paresthesias are detected Psychiatric:  The pt has Normal affect.    ASSESSMENT/PLAN: Wendy Joyce is a 77 y.o. female presenting with concern for temporal arteritis.  Classic symptoms were present prior to corticosteroid administration including headache and some jaw pain. This was not described as classic jaw claudication.  My office was able to obtain labs from Dr. Jacinto Halim office demonstrating a normal ESR and CRP. I do not think there is significant utility in temporal artery biopsy with these findings as both are  negative. These markers have excellent sensitivity, especially when paired together. There have been very few cases of giant cell arteritis in patient with double negative ESR/CRP.   Recommend weaning steroids. Will discuss with Dr. Derrell Lolling.     Victorino Sparrow, MD [facility] [phone]

## 2023-08-04 ENCOUNTER — Other Ambulatory Visit: Payer: Self-pay

## 2023-08-04 ENCOUNTER — Encounter: Payer: Self-pay | Admitting: Vascular Surgery

## 2023-08-04 ENCOUNTER — Ambulatory Visit: Payer: Medicare Other | Admitting: Vascular Surgery

## 2023-08-04 VITALS — BP 171/74 | HR 57 | Temp 97.9°F | Resp 20 | Ht 63.0 in | Wt 141.8 lb

## 2023-08-04 DIAGNOSIS — R519 Headache, unspecified: Secondary | ICD-10-CM | POA: Diagnosis not present

## 2023-08-04 DIAGNOSIS — Z9689 Presence of other specified functional implants: Secondary | ICD-10-CM | POA: Diagnosis not present

## 2023-08-07 ENCOUNTER — Other Ambulatory Visit (HOSPITAL_BASED_OUTPATIENT_CLINIC_OR_DEPARTMENT_OTHER): Payer: Self-pay

## 2023-08-09 ENCOUNTER — Ambulatory Visit (HOSPITAL_COMMUNITY)
Admission: RE | Admit: 2023-08-09 | Discharge: 2023-08-09 | Disposition: A | Discharge status: Home / Self Care | Payer: Medicare Other | Source: Ambulatory Visit | Attending: Internal Medicine | Admitting: Internal Medicine

## 2023-08-09 DIAGNOSIS — N200 Calculus of kidney: Secondary | ICD-10-CM | POA: Diagnosis not present

## 2023-08-09 DIAGNOSIS — R1011 Right upper quadrant pain: Secondary | ICD-10-CM | POA: Insufficient documentation

## 2023-08-09 DIAGNOSIS — N281 Cyst of kidney, acquired: Secondary | ICD-10-CM | POA: Diagnosis not present

## 2023-08-09 MED ORDER — IOHEXOL 300 MG/ML  SOLN
100.0000 mL | Freq: Once | INTRAMUSCULAR | Status: AC | PRN
  Administered 2023-08-09: 100 mL via INTRAVENOUS

## 2023-08-09 MED ORDER — SODIUM CHLORIDE (PF) 0.9 % IJ SOLN
INTRAMUSCULAR | Status: AC
  Filled 2023-08-09: qty 50

## 2023-08-10 ENCOUNTER — Ambulatory Visit (HOSPITAL_COMMUNITY): Admission: RE | Admit: 2023-08-10 | Payer: Medicare Other | Source: Home / Self Care | Admitting: Vascular Surgery

## 2023-08-10 ENCOUNTER — Encounter (HOSPITAL_COMMUNITY): Admission: RE | Payer: Self-pay | Source: Home / Self Care

## 2023-08-10 SURGERY — BIOPSY TEMPORAL ARTERY
Anesthesia: Choice | Laterality: Left

## 2023-08-11 DIAGNOSIS — R69 Illness, unspecified: Secondary | ICD-10-CM | POA: Diagnosis not present

## 2023-08-14 ENCOUNTER — Encounter (HOSPITAL_BASED_OUTPATIENT_CLINIC_OR_DEPARTMENT_OTHER): Payer: Self-pay | Admitting: Family Medicine

## 2023-08-14 ENCOUNTER — Ambulatory Visit (HOSPITAL_BASED_OUTPATIENT_CLINIC_OR_DEPARTMENT_OTHER): Payer: Medicare Other | Admitting: Family Medicine

## 2023-08-14 ENCOUNTER — Ambulatory Visit (INDEPENDENT_AMBULATORY_CARE_PROVIDER_SITE_OTHER): Payer: Medicare Other

## 2023-08-14 VITALS — BP 121/55 | HR 60 | Ht 63.0 in | Wt 140.0 lb

## 2023-08-14 DIAGNOSIS — R42 Dizziness and giddiness: Secondary | ICD-10-CM

## 2023-08-14 DIAGNOSIS — R269 Unspecified abnormalities of gait and mobility: Secondary | ICD-10-CM

## 2023-08-14 DIAGNOSIS — S0990XA Unspecified injury of head, initial encounter: Secondary | ICD-10-CM | POA: Diagnosis not present

## 2023-08-14 NOTE — Progress Notes (Unsigned)
Acute Office Visit  Subjective:     Patient ID: Wendy Joyce, female    DOB: Dec 31, 1945, 77 y.o.   MRN: [id number]  Chief Complaint  Patient presents with   Head Injury    Hit her head on 07/14    Wendy Joyce is a 77 year-old female patient who presents today for concerns about continued symptoms after hitting her head in July. Patient has notes typed up (and will be scanned into her chart):  Sunday, July 14- "benaged my left temple into sharp corner of pointed, jutting moulding on upper kitchen cabinet. Lost balance. Almost passed out- saw stars. Broken skin, slight bleeding. Indentation, not raised bump. Bruising healed, still indented. Felt dizzy and unwell."  Monday, July 29- "in the evening, I began having extreme throbbing pain in my left temple like I have never experienced. Took advil. Could not sleep- up/down all night."  Tuesday, July 30- "woke with bright red face all over but much more intense on the left side (flushing). The whites of my eyes were completely red. My left ear was ringing very loudly. Both of my jaws/teeth aches, but more so on left. Fever blister in left nostril flaring up. Nauseous. Disoriented. Dizzy. She called Dr. Jacinto Halim office and they worked me in that day. 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 Sent me for labs which showed this was not the case. Put me on prednisone 20mg  x4 daily (weaned off as of 08/13/23)."  Saturday, August 3- "tested positive for Covid/Aug 8 tested negative"  Presently- "tried to get appt with neurologist- earliest Oct 23" Appt scheduled for 9/23 "Still dizzy, ok seated. Can walk fairly well in a straight direction. When I turn my foot/leg does not automatically follow, causing me to lurch forward."   Patient reports she has similar symptoms to a concussion she had in 2021 In 2022, was in PT trying to learn how to walk again and her balance improved.  Now, she is having issues with her left leg not  "wanting to move." Denies one-sided weakness, arm dropping, slurred speech, dysarthria. Reports dull headache.  "Whooshing in the head when she turns"  Dr. Derrell Lolling- family functional medicine  But she went to vascular specialist and it was not vascular  Blurred vision- has improved.  Dizzy from sitting to standing. Once she is up she is okay, but once she makes a turn, she is unable to balance. Turning a corner-- "feels like a slosh."  Denies vertigo feeling and has had vertigo in the past before.   Review of Systems  Constitutional:  Negative for malaise/fatigue.  Eyes:  Negative for blurred vision and double vision.  Respiratory:  Negative for shortness of breath.   Cardiovascular:  Negative for chest pain.  Gastrointestinal:  Negative for abdominal pain, nausea and vomiting.  Neurological:  Positive for dizziness (balance issues) and weakness. Negative for tremors, sensory change, speech change, focal weakness, seizures, loss of consciousness and headaches.  Psychiatric/Behavioral:  Negative for depression and suicidal ideas. The patient is not nervous/anxious and does not have insomnia.        Objective:    BP (!) 121/55   Pulse 60   Ht 5\' 3"  (1.6 m)   Wt 140 lb (63.5 kg)   SpO2 98%   BMI 24.80 kg/m   Physical Exam Constitutional:      Appearance: Normal appearance.  Eyes:     Extraocular Movements: Extraocular movements intact.     Right eye: No nystagmus.  Left eye: No nystagmus.  Cardiovascular:     Rate and Rhythm: Normal rate and regular rhythm.     Pulses: Normal pulses.     Heart sounds: Normal heart sounds.  Pulmonary:     Effort: Pulmonary effort is normal.     Breath sounds: Normal breath sounds.  Neurological:     General: No focal deficit present.     Mental Status: She is alert.     GCS: GCS eye subscore is 4. GCS verbal subscore is 5. GCS motor subscore is 6.     Cranial Nerves: Cranial nerves 2-12 are intact.     Sensory: Sensation is intact.      Motor: Motor function is intact.     Coordination: Coordination is intact.     Gait: Gait abnormal and tandem walk abnormal (unable to perform).     Deep Tendon Reflexes: Reflexes are normal and symmetric.  Psychiatric:        Mood and Affect: Mood normal.        Behavior: Behavior normal.        Thought Content: Thought content normal.        Judgment: Judgment normal.    Assessment & Plan:   1. Abnormal gait Patient is a pleasant 77 year-old female patient who presents today for concerns about her abnormal gait and sensation of "loss of balance." Patient is alert and oriented to person, time, place and selt- speech is fluent and smooth- not dysarthric. Recent and remote memory are intact. Attention and concentration are normal. Able to name objects and repeat phrases. CN 2-12 intact, along with sensation. Motor 5/5- no weakness present in upper or lower extremities. Patient is unable to stand from sitting position fluidly. Gait slow and cautious, slightly ataxic, unable to tandem walk- patient reports this is abnormal for her. Romberg negative. No incoordination on finger to nose or heel to shin tests. No dysdiadochokinesia. No acute red flags present on exam. Due to ongoing symptoms, age, and recent head trauma, reasonable to obtain STAT MRI of head. Patient agreeable to plan of care. Will update patient with results of MRI.  - MR Brain Wo Contrast; Future  2. Traumatic injury of head, initial encounter Patient presents with concerns after hitting her head in July after attempting to plug in cord into electrical outlet and hitting head on edge of cabinet (see HPI). She reports when this initially occurred, she noted sensation of a syncopal episode, along with blurred and double vision. She reports she felt nauseous and had headaches for multiple days. She reports it felt similar to when she had a past concussion. She saw her functional medicine provider- Dr. Derrell Lolling. Referred to vascular  specialist for possible giant cell arteritis- which came back negative. Patient is still experiencing loss of balance and difficulty with her gait. She is concerned because she is supposed to go on a vacation to [place] next month.  - MR Brain Wo Contrast; Future  3. Dizziness See #1 & #2 - MR Brain Wo Contrast; Future   Return if symptoms worsen or fail to improve.  Alyson Reedy, FNP

## 2023-08-15 ENCOUNTER — Other Ambulatory Visit (HOSPITAL_BASED_OUTPATIENT_CLINIC_OR_DEPARTMENT_OTHER): Payer: Self-pay

## 2023-08-15 ENCOUNTER — Other Ambulatory Visit (HOSPITAL_BASED_OUTPATIENT_CLINIC_OR_DEPARTMENT_OTHER): Payer: Self-pay | Admitting: Family Medicine

## 2023-08-15 DIAGNOSIS — Z9689 Presence of other specified functional implants: Secondary | ICD-10-CM | POA: Diagnosis not present

## 2023-08-15 DIAGNOSIS — Z419 Encounter for procedure for purposes other than remedying health state, unspecified: Secondary | ICD-10-CM | POA: Diagnosis not present

## 2023-08-15 DIAGNOSIS — N3946 Mixed incontinence: Secondary | ICD-10-CM | POA: Diagnosis not present

## 2023-08-15 DIAGNOSIS — N3281 Overactive bladder: Secondary | ICD-10-CM | POA: Diagnosis not present

## 2023-08-15 MED ORDER — MONTELUKAST SODIUM 10 MG PO TABS
10.0000 mg | ORAL_TABLET | Freq: Every day | ORAL | 0 refills | Status: DC
  Filled 2023-08-21: qty 90, 90d supply, fill #0

## 2023-08-19 ENCOUNTER — Other Ambulatory Visit (HOSPITAL_BASED_OUTPATIENT_CLINIC_OR_DEPARTMENT_OTHER): Payer: Self-pay

## 2023-08-21 ENCOUNTER — Telehealth (HOSPITAL_BASED_OUTPATIENT_CLINIC_OR_DEPARTMENT_OTHER): Payer: Self-pay | Admitting: Family Medicine

## 2023-08-21 ENCOUNTER — Other Ambulatory Visit (HOSPITAL_BASED_OUTPATIENT_CLINIC_OR_DEPARTMENT_OTHER): Payer: Self-pay

## 2023-08-21 MED ORDER — ALPRAZOLAM 0.25 MG PO TABS
0.2500 mg | ORAL_TABLET | Freq: Every day | ORAL | 1 refills | Status: AC | PRN
  Filled 2023-08-21: qty 30, 30d supply, fill #0
  Filled 2023-11-02: qty 30, 30d supply, fill #1

## 2023-08-21 MED ORDER — ALBUTEROL SULFATE HFA 108 (90 BASE) MCG/ACT IN AERS
2.0000 | INHALATION_SPRAY | Freq: Four times a day (QID) | RESPIRATORY_TRACT | 1 refills | Status: DC | PRN
  Filled 2023-08-21: qty 18, 28d supply, fill #0

## 2023-08-21 NOTE — Telephone Encounter (Signed)
Error

## 2023-08-21 NOTE — Telephone Encounter (Signed)
Patient inquiring if Wendy Joyce can refill her albuterol. Please advise patient

## 2023-08-21 NOTE — Telephone Encounter (Signed)
Medication sent in. 

## 2023-08-22 DIAGNOSIS — K080 Exfoliation of teeth due to systemic causes: Secondary | ICD-10-CM | POA: Diagnosis not present

## 2023-08-23 ENCOUNTER — Other Ambulatory Visit (HOSPITAL_BASED_OUTPATIENT_CLINIC_OR_DEPARTMENT_OTHER): Payer: Self-pay | Admitting: Family Medicine

## 2023-08-23 ENCOUNTER — Other Ambulatory Visit (HOSPITAL_BASED_OUTPATIENT_CLINIC_OR_DEPARTMENT_OTHER): Payer: Self-pay

## 2023-08-23 DIAGNOSIS — F5101 Primary insomnia: Secondary | ICD-10-CM

## 2023-08-23 MED ORDER — ZALEPLON 5 MG PO CAPS
5.0000 mg | ORAL_CAPSULE | Freq: Every evening | ORAL | 1 refills | Status: DC | PRN
Start: 2023-08-23 — End: 2023-11-16
  Filled 2023-08-23: qty 20, 20d supply, fill #0

## 2023-08-23 NOTE — Telephone Encounter (Signed)
PDMP reviewed. No red flags. Refill request sent.

## 2023-08-25 ENCOUNTER — Other Ambulatory Visit (HOSPITAL_BASED_OUTPATIENT_CLINIC_OR_DEPARTMENT_OTHER): Payer: Self-pay

## 2023-08-25 DIAGNOSIS — Z01818 Encounter for other preprocedural examination: Secondary | ICD-10-CM | POA: Diagnosis not present

## 2023-08-25 DIAGNOSIS — N393 Stress incontinence (female) (male): Secondary | ICD-10-CM | POA: Diagnosis not present

## 2023-08-30 ENCOUNTER — Other Ambulatory Visit (HOSPITAL_BASED_OUTPATIENT_CLINIC_OR_DEPARTMENT_OTHER): Payer: Self-pay

## 2023-08-30 MED ORDER — METFORMIN HCL 500 MG PO TABS
ORAL_TABLET | ORAL | 0 refills | Status: DC
  Filled 2023-08-30: qty 90, 30d supply, fill #0

## 2023-08-31 ENCOUNTER — Other Ambulatory Visit (HOSPITAL_BASED_OUTPATIENT_CLINIC_OR_DEPARTMENT_OTHER): Payer: Self-pay

## 2023-09-02 ENCOUNTER — Other Ambulatory Visit (HOSPITAL_BASED_OUTPATIENT_CLINIC_OR_DEPARTMENT_OTHER): Payer: Self-pay

## 2023-09-14 ENCOUNTER — Other Ambulatory Visit (HOSPITAL_BASED_OUTPATIENT_CLINIC_OR_DEPARTMENT_OTHER): Payer: Self-pay

## 2023-09-14 DIAGNOSIS — E559 Vitamin D deficiency, unspecified: Secondary | ICD-10-CM | POA: Diagnosis not present

## 2023-09-14 DIAGNOSIS — E782 Mixed hyperlipidemia: Secondary | ICD-10-CM | POA: Diagnosis not present

## 2023-09-14 DIAGNOSIS — R799 Abnormal finding of blood chemistry, unspecified: Secondary | ICD-10-CM | POA: Diagnosis not present

## 2023-09-14 DIAGNOSIS — R946 Abnormal results of thyroid function studies: Secondary | ICD-10-CM | POA: Diagnosis not present

## 2023-09-14 DIAGNOSIS — E039 Hypothyroidism, unspecified: Secondary | ICD-10-CM | POA: Diagnosis not present

## 2023-09-14 DIAGNOSIS — E063 Autoimmune thyroiditis: Secondary | ICD-10-CM | POA: Diagnosis not present

## 2023-09-14 DIAGNOSIS — D6489 Other specified anemias: Secondary | ICD-10-CM | POA: Diagnosis not present

## 2023-09-14 DIAGNOSIS — R7301 Impaired fasting glucose: Secondary | ICD-10-CM | POA: Diagnosis not present

## 2023-09-14 MED ORDER — INFLUENZA VAC A&B SURF ANT ADJ 0.5 ML IM SUSY
0.5000 mL | PREFILLED_SYRINGE | Freq: Once | INTRAMUSCULAR | 0 refills | Status: AC
  Filled 2023-09-14: qty 0.5, 1d supply, fill #0
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
## 2023-09-18 ENCOUNTER — Other Ambulatory Visit (HOSPITAL_BASED_OUTPATIENT_CLINIC_OR_DEPARTMENT_OTHER): Payer: Self-pay

## 2023-09-18 ENCOUNTER — Encounter: Payer: Self-pay | Admitting: Adult Health

## 2023-09-18 ENCOUNTER — Other Ambulatory Visit (HOSPITAL_BASED_OUTPATIENT_CLINIC_OR_DEPARTMENT_OTHER): Payer: Self-pay | Admitting: Family Medicine

## 2023-09-18 ENCOUNTER — Ambulatory Visit (INDEPENDENT_AMBULATORY_CARE_PROVIDER_SITE_OTHER): Payer: Medicare Other | Admitting: Adult Health

## 2023-09-18 ENCOUNTER — Other Ambulatory Visit: Payer: Self-pay

## 2023-09-18 VITALS — BP 124/65 | HR 58 | Ht 63.6 in | Wt 143.6 lb

## 2023-09-18 DIAGNOSIS — R69 Illness, unspecified: Secondary | ICD-10-CM | POA: Diagnosis not present

## 2023-09-18 DIAGNOSIS — R42 Dizziness and giddiness: Secondary | ICD-10-CM | POA: Diagnosis not present

## 2023-09-18 DIAGNOSIS — E063 Autoimmune thyroiditis: Secondary | ICD-10-CM

## 2023-09-18 DIAGNOSIS — F0781 Postconcussional syndrome: Secondary | ICD-10-CM | POA: Diagnosis not present

## 2023-09-18 DIAGNOSIS — E038 Other specified hypothyroidism: Secondary | ICD-10-CM

## 2023-09-18 MED ORDER — LEVOTHYROXINE SODIUM 112 MCG PO TABS
112.0000 ug | ORAL_TABLET | Freq: Every day | ORAL | 1 refills | Status: DC
  Filled 2023-09-18: qty 90, 90d supply, fill #0

## 2023-09-18 NOTE — Patient Instructions (Addendum)
Your Plan:  Continue Aricept  Will order PT after you get clearance from surgery If your symptoms worsen or you develop new symptoms please let us know.   Thank you for coming to see [place] at [facility]. I hope we have been able to provide you high quality care today.  You may receive a patient satisfaction survey over the next few weeks. We would appreciate your feedback and comments so that we may continue to improve ourselves and the health of our patients.

## 2023-09-19 ENCOUNTER — Other Ambulatory Visit (HOSPITAL_BASED_OUTPATIENT_CLINIC_OR_DEPARTMENT_OTHER): Payer: Self-pay

## 2023-09-19 ENCOUNTER — Encounter: Payer: Medicare Other | Admitting: Vascular Surgery

## 2023-09-19 DIAGNOSIS — N393 Stress incontinence (female) (male): Secondary | ICD-10-CM | POA: Diagnosis not present

## 2023-09-19 DIAGNOSIS — N3281 Overactive bladder: Secondary | ICD-10-CM | POA: Diagnosis not present

## 2023-09-19 MED ORDER — OXYCODONE HCL 5 MG PO TABS
5.0000 mg | ORAL_TABLET | ORAL | 0 refills | Status: DC | PRN
  Filled 2023-09-19: qty 10, 2d supply, fill #0

## 2023-09-27 ENCOUNTER — Other Ambulatory Visit (HOSPITAL_BASED_OUTPATIENT_CLINIC_OR_DEPARTMENT_OTHER): Payer: Self-pay

## 2023-09-27 MED ORDER — METFORMIN HCL 500 MG PO TABS
ORAL_TABLET | ORAL | 0 refills | Status: DC
  Filled 2023-09-27: qty 90, 30d supply, fill #0

## 2023-09-29 DIAGNOSIS — R69 Illness, unspecified: Secondary | ICD-10-CM | POA: Diagnosis not present

## 2023-10-06 DIAGNOSIS — N393 Stress incontinence (female) (male): Secondary | ICD-10-CM | POA: Diagnosis not present

## 2023-10-09 DIAGNOSIS — M1812 Unilateral primary osteoarthritis of first carpometacarpal joint, left hand: Secondary | ICD-10-CM | POA: Diagnosis not present

## 2023-10-11 DIAGNOSIS — R69 Illness, unspecified: Secondary | ICD-10-CM | POA: Diagnosis not present

## 2023-10-12 ENCOUNTER — Telehealth: Payer: Self-pay | Admitting: Adult Health

## 2023-10-12 DIAGNOSIS — F0781 Postconcussional syndrome: Secondary | ICD-10-CM

## 2023-10-12 NOTE — Telephone Encounter (Signed)
Spoke to patient . Pt wants to move forward with Neuro PT  made  pt aware Megan,NP was out the office and will be back in office next week Informed pt will send order to Megan,NP for approval . Made pt aware it takes up to two weeks for insurance approval Pt states sent  a Mychart message last week nobody called her back . Pt also states her PCP sent paperwork over here also.  Pt states she was told from phone room didn't see message. I checked also and no message was sent . Made pt aware it may be a glitch in system and will make office manager aware Pt states thanked me for calling

## 2023-10-12 NOTE — Telephone Encounter (Signed)
Pt called and stated that she had sent a mychart message letting the provider know that she has been released and is able to start her therapy that was discussed in last appt. Pt was very Rude saying she wants this to get started as soon as possible and what does she have to do to get this going. She stated that she has been waiting for and answer and no one has reached out to her. Please advise.

## 2023-10-16 ENCOUNTER — Other Ambulatory Visit (HOSPITAL_BASED_OUTPATIENT_CLINIC_OR_DEPARTMENT_OTHER): Payer: Self-pay | Admitting: Family Medicine

## 2023-10-16 ENCOUNTER — Other Ambulatory Visit (HOSPITAL_BASED_OUTPATIENT_CLINIC_OR_DEPARTMENT_OTHER): Payer: Self-pay

## 2023-10-16 MED ORDER — DONEPEZIL HCL 5 MG PO TABS
5.0000 mg | ORAL_TABLET | Freq: Two times a day (BID) | ORAL | 1 refills | Status: DC
  Filled 2023-10-16: qty 180, 90d supply, fill #0
  Filled 2024-01-13: qty 60, 30d supply, fill #1
  Filled 2024-02-12: qty 60, 30d supply, fill #2

## 2023-10-17 DIAGNOSIS — L821 Other seborrheic keratosis: Secondary | ICD-10-CM | POA: Diagnosis not present

## 2023-10-17 DIAGNOSIS — D225 Melanocytic nevi of trunk: Secondary | ICD-10-CM | POA: Diagnosis not present

## 2023-10-17 DIAGNOSIS — D2261 Melanocytic nevi of right upper limb, including shoulder: Secondary | ICD-10-CM | POA: Diagnosis not present

## 2023-10-20 DIAGNOSIS — H04123 Dry eye syndrome of bilateral lacrimal glands: Secondary | ICD-10-CM | POA: Diagnosis not present

## 2023-10-20 DIAGNOSIS — D3132 Benign neoplasm of left choroid: Secondary | ICD-10-CM | POA: Diagnosis not present

## 2023-10-20 DIAGNOSIS — R7309 Other abnormal glucose: Secondary | ICD-10-CM | POA: Diagnosis not present

## 2023-10-20 DIAGNOSIS — H524 Presbyopia: Secondary | ICD-10-CM | POA: Diagnosis not present

## 2023-10-20 DIAGNOSIS — H43393 Other vitreous opacities, bilateral: Secondary | ICD-10-CM | POA: Diagnosis not present

## 2023-10-23 ENCOUNTER — Encounter: Payer: Self-pay | Admitting: Physical Therapy

## 2023-10-23 ENCOUNTER — Ambulatory Visit: Payer: Medicare Other | Attending: Orthopedic Surgery | Admitting: Physical Therapy

## 2023-10-23 ENCOUNTER — Other Ambulatory Visit: Payer: Self-pay

## 2023-10-23 VITALS — BP 114/77 | HR 54

## 2023-10-23 DIAGNOSIS — F0781 Postconcussional syndrome: Secondary | ICD-10-CM | POA: Insufficient documentation

## 2023-10-23 DIAGNOSIS — R42 Dizziness and giddiness: Secondary | ICD-10-CM

## 2023-10-23 DIAGNOSIS — R2681 Unsteadiness on feet: Secondary | ICD-10-CM

## 2023-10-23 DIAGNOSIS — R69 Illness, unspecified: Secondary | ICD-10-CM | POA: Diagnosis not present

## 2023-10-23 NOTE — Therapy (Unsigned)
OUTPATIENT PHYSICAL THERAPY VESTIBULAR EVALUATION     Patient Name: Wendy Joyce MRN: [id number] DOB:12/04/1946, 77 y.o., female Today's Date: 10/24/2023  END OF SESSION:  PT End of Session - 10/23/23 1535     Visit Number 1    Number of Visits 10    Date for PT Re-Evaluation 12/12/23    Authorization Type [employer] [place]    PT Start Time [phone]    PT Stop Time 1618    PT Time Calculation (min) 46 min    Equipment Utilized During Treatment Gait belt    Activity Tolerance Patient tolerated treatment well    Behavior During Therapy Anxious;Lability   tearful            Past Medical History:  Diagnosis Date   Abdominal pain, generalized 01/29/2015   Abnormal thyroid function test 02/25/2016   ADHD (attention deficit hyperactivity disorder)    Allergic rhinitis, unspecified 01/29/2015   Allergy    Anemia    DOE   Ankle sprain 03/16/2016   Anxiety 01/29/2015   Arthritis    hands   Chronic kidney disease 2012   kidney stones   Constipation 01/29/2015   Decreased libido 05/02/2018   Disorder of the skin and subcutaneous tissue, unspecified 01/29/2015   DJD (degenerative joint disease)    hand   Dysthymia    Endometriosis    External otitis    Family history of coronary artery disease 05/02/2018   Fatigue due to excessive exertion 02/24/2016   Fever blister    Fibromyalgia 01/29/2015   Frequency of micturition 01/29/2015   Functional dyspepsia 01/29/2015   Gastro-esophageal reflux disease without esophagitis 01/29/2015   Headache(784.0)    High cholesterol    Hyperlipidemia 09/12/2016   Hypersomnia    Hypothyroidism    IBS (irritable bowel syndrome) 05/02/2018   Insomnia    Internal hemorrhoids    Irritable 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 01/29/2015   Labial pain 11/30/2016   Lactose intolerance 12/14/2016   Lactose intolerance in adult 09/12/2016   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   Left ureteral stone    LP (lichen planus)  01/29/2015   Major depressive disorder, single episode, unspecified    Migraine with aura, not intractable, without status migrainosus    Mixed hyperlipidemia    Mouth pain 01/07/2016   Osteoarthritis of hip    Osteopenia determined by x-ray 03/02/2017   Personal history of estrogen therapy    Phlebitis and thrombophlebitis of the leg    Pityriasis alba 01/29/2015   Pure hypercholesterolemia 06/29/2016   Rectum pain 09/12/2016   Recurrent sinus infections 02/24/2016   Rhinosinusitis 01/07/2016   Rosacea    Sinusitis 12/24/2015   Tinnitus of left ear 01/29/2015   Umbilical hernia without obstruction and without gangrene 01/29/2015   Umbilical pain 05/02/2018   Unspecified urinary incontinence 01/07/2016   Urticaria    UTI (urinary tract infection)    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   Vitamin D deficiency    Past Surgical History:  Procedure Laterality Date   BREAST ENHANCEMENT SURGERY     BREAST REDUCTION SURGERY Bilateral 2018   COLONOSCOPY     COLONOSCOPY WITH PROPOFOL N/A 02/25/2013   Procedure: COLONOSCOPY WITH PROPOFOL;  Surgeon: Charolett Bumpers, MD;  Location: [facility];  Service: Endoscopy;  Laterality: N/A;   left hand surgery  12/28/2022   mini facelift  2000   NASAL SINUS SURGERY     REDUCTION MAMMAPLASTY  ROTATOR CUFF REPAIR     Right   ROTATOR CUFF REPAIR     Left   SEPTOPLASTY WITH ETHMOIDECTOMY, AND MAXILLARY ANTROSTOMY Bilateral 2006   TOTAL ABDOMINAL HYSTERECTOMY     ovaries remain   TUBAL LIGATION     umbicial hernia     Patient Active Problem List   Diagnosis Date Noted   Right lateral abdominal pain 05/17/2023   RUQ abdominal pain 04/12/2023   Depression, recurrent (HCC) 12/25/2022   Preoperative clearance 11/23/2022   Encounter for counseling 10/14/2022   Influenza 09/18/2022   Pain of left hand 06/29/2022   Vasomotor symptoms due to menopause 05/30/2022   Pre-diabetes 05/30/2022   Anxiety  disorder 03/17/2022   Allergic rhinitis 03/17/2022   Constipation 03/17/2022   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   Exertional dyspnea 03/17/2022   Family history of coronary artery disease 03/17/2022   Weakness with dizziness 03/17/2022   Fever blister 03/17/2022   Frequency of micturition 03/17/2022   Irritable bowel syndrome 03/17/2022   Lactose intolerance 03/17/2022   Left anterior fascicular block 03/17/2022   Lichen planus 03/17/2022   Panniculitis 03/17/2022   Refractory migraine with aura 03/17/2022   Statin not tolerated 03/17/2022   Thrombophlebitis of superficial veins of lower extremity 03/17/2022   Bilateral tinnitus 03/17/2022   Umbilical hernia 03/17/2022   Urinary incontinence 03/17/2022   Urticaria due to drug allergy 03/17/2022   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 03/17/2022   Sexual dysfunction 03/17/2022   Imbalance 03/17/2022   Fibromyalgia 09/21/2021   Hyperlipidemia 09/21/2021   Hashimoto's thyroiditis 03/31/2021   Hypothyroidism 09/27/2013    PCP: Alyson Reedy, FNP REFERRING PROVIDER: Butch Penny, NP  REFERRING DIAG: 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  Patient was seen previously for concussion rehab at this clinic in 2022 following concussion in December 2021. Patient reports second concussion in 06/2023 when she hit her head on a cabinet. Patient reports that she did not loose consciousness but felt like "I almost passed out and saw stars" Since then patient has  been reporting challenges with walking when she has to turn and feels like her left leg does not come with her and she feels like she is lurching. Patient also feels like when she is standing still she is swaying back and forth and has to be very careful when picking things up. Patient reports that she feels like she is "not working right" and "my legs are not getting the message" when I try to get up and walk. Dizziness primarily with head turns and when standing. Patient currently not taking any migraine medications. Patient reports photophobia.   Pt accompanied by: self  PERTINENT HISTORY: bladder surgery September 2024 for most recent (see below for precautions), prior history of concussion 2021, PTSD (reports triggers can include loud dizzy space), and anxiety in large groups   PAIN:  Are you having pain? No  PRECAUTIONS: Fall and Other: no core work for at least one more week   RED FLAGS: Bowel or bladder incontinence: Yes: bladder incontinence stress and requires  use of internal sling to support, managed and consistent with known medical history     WEIGHT BEARING RESTRICTIONS: Yes nothing over 20 lbs for lifting   FALLS: Has patient fallen in last 6 months? Yes. Number of falls 1 fall with concussion and then some close calls about 6 since then  LIVING ENVIRONMENT: Lives with: lives with their spouse Lives in: House/apartment Stairs: Yes - with railing about 15-20 to go up and down  Has following equipment at home: Single point cane  PLOF: Reports being fulling independent, works as an Tree surgeon   PATIENT GOALS: "Patient wants to feel secure when walking in store (not feeling like needs a cart) and would like to get back to dance, yoga, and exercise."   VITALS:  Vitals:   10/23/23 1541  BP: 114/77  Pulse: (!) 54   OBJECTIVE:  Note: Objective measures were completed at Evaluation unless otherwise noted.  DIAGNOSTIC FINDINGS:   MRI Brain 08/14/2023 IMPRESSION: No evidence  of acute abnormality.  COGNITION: Overall cognitive status: Within functional limits for tasks assessed   SENSATION: WFL  POSTURE:  Grossly WFL  Cervical ROM:    Active A/PROM (deg) eval  Flexion WFL  Extension WFL  Right lateral flexion WFL  Left lateral flexion WFL  Right rotation WFL  Left rotation WFL  (Blank rows = not tested)  GAIT: Gait pattern:  hesitancy with initial few steps, cervical guarding and slow on turns Distance walked: short clinic distances < 60 feet Assistive device utilized: None Level of assistance: SBA  PATIENT SURVEYS:   [place] Post Concussion Symptoms Questionnaire:  Compared to before the accident, do you now (last 24 hours) suffer from:  Headaches 1 = no more of a problem  Feeling of Dizziness 3 = moderate problem  Nausea and/or vomiting 2 = mild problem  RPQ-3 (total of first 3 items) 6     Noise sensitivity (easily upset by loud noises) 3 = moderate problem  Sleep disturbances 3 = moderate problem  Fatigue, tiring more easily 4 = severe problem  Being irritable, easily angered: 4 = severe problem  Feeling depressed or tearful 4 = severe problem  Feeling frustrated or impatient 4 = severe problem  Forgetfulness, poor memory 3 = moderate problem  Poor concentration 3 = moderate problem  Taking longer to think 3 = moderate problem  Blurred vision 3 = moderate problem  Light sensitivity (easily upset by bright light) 4 = severe problem  Double vision 1 = no more of a problem  Restlessness 1 = no more of a problem  RPQ-13 (total for next 13 items) 40    Written in components on RPQ: Trouble turning as walk: 3 Trouble seated to up and walk: 3  VESTIBULAR ASSESSMENT:  GENERAL OBSERVATION: reading glasses not donned during session, ambulates without AD, prefers room dimmed during session   SYMPTOM BEHAVIOR:  Subjective history: see above Non-Vestibular symptoms: changes in hearing, diplopia, neck pain, headaches, tinnitus,  nausea/vomiting, migraine symptoms, and double vision when watching TV sometimes Type of dizziness: Blurred Vision, Diplopia, Imbalance (Disequilibrium), Unsteady with head/body turns, Lightheadedness/Faint, "Funny feeling in the head", "World moves", and "Swimmyheaded"  Frequency: every day  Duration: short time (seconds and usually when turning)    Aggravating factors: turning   Relieving factors: sitting down  Progression of symptoms: better  Given time constraints of session; unable to assess further   VESTIBULAR TREATMENT:  Initial Eval only   PATIENT EDUCATION: Education details: POC, goal collaboration, examination findings  Person educated: Patient Education method: Explanation Education comprehension: verbalized understanding and needs further education  HOME EXERCISE PROGRAM:  GOALS: Goals reviewed with patient? Yes  SHORT TERM GOALS: Target date: 11/21/2023  Patient will demonstrate independence with initial HEP to continue to progress between physical therapy sessions.   Baseline: To be provided Goal status: INITIAL  2.  Patient will improve RPQ-3 score to </= 4 to indicate reduced severity of post-concussive symptoms to progress towards PLOF.   Baseline: 6 Goal status: INITIAL  3.  MSQ to be assessed and LTG written Baseline: To be assessed  Goal status: INITIAL  4.  SOT to be assessed and LTG written Baseline: To be assessed  Goal status: INITIAL  LONG TERM GOALS: Target date: 12/12/2023  Patient will report demonstrate independence with final HEP in order to maintain current gains and continue to progress after physical therapy discharge.   Baseline: To be provided Goal status: INITIAL  2.  Patient will improve RPQ-13 score to </= 3 per item to indicate reduced severity of post-concussive symptoms to progress towards PLOF.   Baseline: 4/5 greatest Goal  status: INITIAL  3.  MSQ to be assessed and LTG  Baseline: To be assessed  Goal status: INITIAL  4.  SOT to be assessed and LTG written Baseline: To be assessed  Goal status: INITIAL  5.  Patient will reduce score on written in components on [place] (Trouble turning as walk and Trouble seated to up and walk) to 2/4 or less to indicate reduction in concussion symptoms. Baseline: 3/4 Goal status: INITIAL  ASSESSMENT:  CLINICAL IMPRESSION: Patient is a 77 y.o. Lovina Reach who was seen today for physical therapy evaluation and treatment for post-concussive syndrome with prior history of concussion in 12/20221 and 06/2023. PMH also includes anxiety and PTSD. Patient presents with high levels of impairment on [place] RPQ-13 and moderate levels on RPQ-3. Patient reports moderate challenges with gait with head turns and getting up to walk from sitting. Session somewhat limited in eval as patient tearful during session given complex medical history and spent time discussing how to best meet patient's needs during session. Plan to assess occulomotor/vestibular systems in next session. Patient will benefit from skilled physical therapy to address needs and progress towards PLOF.  OBJECTIVE IMPAIRMENTS: Abnormal gait, decreased balance, difficulty walking, dizziness, and pain.   ACTIVITY LIMITATIONS: carrying, lifting, bending, standing, transfers, and locomotion level  PARTICIPATION LIMITATIONS: meal prep, shopping, community activity, and yard work  PERSONAL FACTORS: Past/current experiences, Time since onset of injury/illness/exacerbation, and 3+ comorbidities: see above  are also affecting patient's functional outcome.   REHAB POTENTIAL: Good  CLINICAL DECISION MAKING: Evolving/moderate complexity  EVALUATION COMPLEXITY: Moderate   PLAN:  PT FREQUENCY: 2x/week  PT DURATION: 7 weeks  PLANNED INTERVENTIONS: 97164- PT Re-evaluation, 97110-Therapeutic exercises, 97530- Therapeutic  activity, 97112- Neuromuscular re-education, 97535- Self Care, 84132- Manual therapy, [id number]- Gait training, and [phone]- [place] repositioning  PLAN FOR NEXT SESSION: assess vestibular exam and write goals on MSQ as indicated, assess SOT (if tolerated) and write goals as indicated, continue POC, behavioral health if helpful  Carmelia Bake, PT, DPT 10/24/2023, 8:56 AM

## 2023-10-25 NOTE — Progress Notes (Addendum)
[facility] - Clinic Note  11/07/2023   CHIEF COMPLAINT Patient presents for Retina Evaluation  HISTORY OF PRESENT ILLNESS: Wendy Joyce is a 77 y.o. female who presents to the clinic today for:  HPI     Retina Evaluation   In left eye.  I, the attending physician,  performed the HPI with the patient and updated documentation appropriately.        Comments   Patient is here today based on a referral from Dr. Bascom Levels for a nevus in the left eye. She has not noticed any issues with her vision. She did hit her head on the left side back in July. She is using [facility] OU PRN.      Last edited by Rennis Chris, MD on 11/07/2023  4:26 PM.    Pt is here on the referral of Dr. Bascom Levels for concern of choroidal nevus OS, pt states Dr. Bascom Levels told her he saw a freckle in her left eye, but it wasn't his area of expertise so he wanted someone else to look at it   Referring physician: [place], [place], OD [address] [place],  [place] [address]  HISTORICAL INFORMATION:  Selected notes from the MEDICAL RECORD NUMBER Referred by Dr. Bascom Levels for choroidal nevus OS LEE:  Ocular Hx- PMH-   CURRENT MEDICATIONS: Current Outpatient Medications (Ophthalmic Drugs)  Medication Sig   erythromycin ophthalmic ointment Place small amount into both eyes at bedtime.   No current facility-administered medications for this visit. (Ophthalmic Drugs)   Current Outpatient Medications (Other)  Medication Sig   acyclovir ointment (ZOVIRAX) 5 % Apply 1 application. topically daily as needed (for fever blister).   albuterol (VENTOLIN HFA) 108 (90 Base) MCG/ACT inhaler Inhale 2 puffs into the lungs every 6 (six) hours as needed for wheezing or shortness of breath.   ALPRAZolam (XANAX) 0.25 MG tablet Take 1-2 tablets (0.25-0.5 mg total) by mouth daily as needed. (must last 30 days)   cetirizine (ZYRTEC) 10 MG tablet Take 10 mg by mouth daily.   donepezil (ARICEPT) 5 MG tablet  Take 1 tablet (5 mg total) by mouth 2 (two) times daily.   estradiol (ESTRACE) 0.1 MG/GM vaginal cream Insert blueberry size amount of cream on finger (or 0.5 grams with applicator) in vagina daily for 2 weeks, THEN twice weekly.   estradiol (ESTRACE) 0.5 MG tablet Take 1 tablet (0.5 mg total) by mouth daily.   levothyroxine (SYNTHROID) 112 MCG tablet Take 1 tablet (112 mcg total) by mouth daily.   metFORMIN (GLUCOPHAGE) 500 MG tablet Take 0.5 tablet by mouth daily for 7 days, THEN 0.5 tablet twice daily for 7 days, THEN 1 tablet in AM and 0.5 tablet in PM for 7 days, THEN 1 tablet twice daily.   montelukast (SINGULAIR) 10 MG tablet Take 1 tablet (10 mg total) by mouth daily for allergies   Naltrexone HCl, Pain, 4.5 MG CAPS Prescribed by psychiatry for depression. Once a day.   progesterone (PROMETRIUM) 100 MG capsule Take 1 capsule (100 mg total) by mouth before bedtime for 1 week. May increase to 2 capsules to help with sleep.   valACYclovir (VALTREX) 500 MG tablet Take 1 tablet (500 mg total) by mouth 2 (two) times daily.   zaleplon (SONATA) 5 MG capsule Take 1 capsule (5 mg total) by mouth at bedtime as needed for sleep.   No current facility-administered medications for this visit. (Other)   REVIEW OF SYSTEMS: ROS   Positive for:  Gastrointestinal, Neurological, Eyes, Respiratory Negative for: Constitutional Last edited by Charlette Caffey, COT on 11/07/2023  1:10 PM.     ALLERGIES Allergies  Allergen Reactions   Adhesive [Tape] Dermatitis   Atovaquone-Proguanil Hcl Other (See Comments)   Bioflavonoids    Citrus Nausea And Vomiting   Emedastine Nausea And Vomiting   Escitalopram Hives   Escitalopram Oxalate Hives   Ezetimibe-Simvastatin Other (See Comments)   Niacin Other (See Comments)   Niacin And Related     Rash and breathing breathing problems   Rosuvastatin Other (See Comments)   Versed [Midazolam] Nausea And Vomiting    For a week per pt   Amoxicillin Rash     Unsure of reaction/ was instructed not to take drug. Unknown reaction    Hydrochlorothiazide Rash   PAST MEDICAL HISTORY Past Medical History:  Diagnosis Date   Abdominal pain, generalized 01/29/2015   Abnormal thyroid function test 02/25/2016   ADHD (attention deficit hyperactivity disorder)    Allergic rhinitis, unspecified 01/29/2015   Allergy    Anemia    DOE   Ankle sprain 03/16/2016   Anxiety 01/29/2015   Arthritis    hands   Chronic kidney disease 2012   kidney stones   Constipation 01/29/2015   Decreased libido 05/02/2018   Disorder of the skin and subcutaneous tissue, unspecified 01/29/2015   DJD (degenerative joint disease)    hand   Dysthymia    Endometriosis    External otitis    Family history of coronary artery disease 05/02/2018   Fatigue due to excessive exertion 02/24/2016   Fever blister    Fibromyalgia 01/29/2015   Frequency of micturition 01/29/2015   Functional dyspepsia 01/29/2015   Gastro-esophageal reflux disease without esophagitis 01/29/2015   Headache(784.0)    High cholesterol    Hyperlipidemia 09/12/2016   Hypersomnia    Hypothyroidism    IBS (irritable bowel syndrome) 05/02/2018   Insomnia    Internal hemorrhoids    Irritable 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 01/29/2015   Labial pain 11/30/2016   Lactose intolerance 12/14/2016   Lactose intolerance in adult 09/12/2016   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   Left ureteral stone    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   Major depressive disorder, single episode, unspecified    Migraine with aura, not intractable, without status migrainosus    Mixed hyperlipidemia    Mouth pain 01/07/2016   Osteoarthritis of hip    Osteopenia determined by x-ray 03/02/2017   Personal history of estrogen therapy    Phlebitis and thrombophlebitis of the leg    Pityriasis alba 01/29/2015   Pure hypercholesterolemia 06/29/2016   Rectum pain 09/12/2016   Recurrent sinus infections 02/24/2016    Rhinosinusitis 01/07/2016   Rosacea    Sinusitis 12/24/2015   Tinnitus of left ear 01/29/2015   Umbilical hernia without obstruction and without gangrene 01/29/2015   Umbilical pain 05/02/2018   Unspecified urinary incontinence 01/07/2016   Urticaria    UTI (urinary tract infection)    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   Vitamin D deficiency    Past Surgical History:  Procedure Laterality Date   BREAST ENHANCEMENT SURGERY     BREAST REDUCTION SURGERY Bilateral 2018   COLONOSCOPY     COLONOSCOPY WITH PROPOFOL N/A 02/25/2013   Procedure: COLONOSCOPY WITH PROPOFOL;  Surgeon: Charolett Bumpers, MD;  Location: [facility];  Service: Endoscopy;  Laterality: N/A;   left hand surgery  12/28/2022   mini facelift  2000  NASAL SINUS SURGERY     REDUCTION MAMMAPLASTY     ROTATOR CUFF REPAIR     Right   ROTATOR CUFF REPAIR     Left   SEPTOPLASTY WITH ETHMOIDECTOMY, AND MAXILLARY ANTROSTOMY Bilateral 2006   TOTAL ABDOMINAL HYSTERECTOMY     ovaries remain   TUBAL LIGATION     umbicial hernia     FAMILY HISTORY Family History  Problem Relation Age of Onset   Stroke Father    Hypertension Father    Cancer Father    Breast cancer Maternal Grandmother    Colon cancer Neg Hx    Colon polyps Neg Hx    Esophageal cancer Neg Hx    Rectal cancer Neg Hx    Stomach cancer Neg Hx    SOCIAL HISTORY Social History   Tobacco Use   Smoking status: Never   Smokeless tobacco: Never  Vaping Use   Vaping status: Never Used  Substance Use Topics   Alcohol use: Yes    Alcohol/week: 2.0 standard drinks of alcohol    Types: 2 Glasses of wine per week    Comment: weekends occ   Drug use: No       OPHTHALMIC EXAM:  Base Eye Exam     Visual Acuity (Snellen - Linear)       Right Left   Dist [place] 20/20 20/25   Dist ph [place]  20/20         Tonometry (Tonopen, 1:13 PM)       Right Left   Pressure 15 16         Pupils       Dark Light Shape  React APD   Right 5 4 Round Brisk None   Left 5 4 Round Brisk None         Visual Fields       Left Right    Full Full         Extraocular Movement       Right Left    Full, Ortho Full, Ortho         Neuro/Psych     Oriented x3: Yes         Dilation     Both eyes: 1.0% Mydriacyl, 2.5% Phenylephrine @ 1:11 PM           Slit Lamp and Fundus Exam     Slit Lamp Exam       Right Left   Lids/Lashes Normal Normal   Conjunctiva/Sclera White and quiet White and quiet   Cornea mild arcus, well healed cataract wound, mild tear film debris mild arcus, well healed cataract wound, tear film debris   Anterior Chamber deep and clear deep and clear   Iris Round and dilated Round and dilated   Lens PC IOL in good position with open PC PC IOL in good position with open PC   Anterior Vitreous mild syneresis, Posterior vitreous detachment, vitreous condensations mild syneresis         Fundus Exam       Right Left   Disc Pink and Sharp Pink and Sharp, Compact   C/D Ratio 0.5 0.3   Macula Flat, Blunted foveal reflex, RPE mottling, No heme or edema Flat, good foveal reflex, RPE mottling, No heme or edema   Vessels attenuated, mild copper wiring, mild tortuosity attenuated, mild copper wiring, mild tortuosity   Periphery Attached Attached, pigmented choroidal lesion at 1015 midzone with minimal elevation, +drusen, no SRF or orange pigment (  approx 2DD in size), No heme           IMAGING AND PROCEDURES  Imaging and Procedures for 11/07/2023  OCT, Retina - OU - Both Eyes       Right Eye Quality was good. Central Foveal Thickness: 285. Progression has no prior data. Findings include normal foveal contour, no IRF, no SRF.   Left Eye Quality was good. Central Foveal Thickness: 292. Progression has no prior data. Findings include normal foveal contour, no IRF, no SRF (Focal Hyper reflective choroidal lesion SN periphery -- caught on widefield).   Notes *Images  captured and stored on drive  Diagnosis / Impression:  OD: NFP, no IRF/SRF OS: Focal Hyper reflective choroidal lesion SN periphery -- caught on widefield  Clinical management:  See below  Abbreviations: NFP - Normal foveal profile. CME - cystoid macular edema. PED - pigment epithelial detachment. IRF - intraretinal fluid. SRF - subretinal fluid. EZ - ellipsoid zone. ERM - epiretinal membrane. ORA - outer retinal atrophy. ORT - outer retinal tubulation. SRHM - subretinal hyper-reflective material. IRHM - intraretinal hyper-reflective material      Color Fundus Photography Optos - OU - Both Eyes       Right Eye Progression has no prior data. Disc findings include normal observations. Macula : normal observations. Vessels : attenuated. Periphery : normal observations.   Left Eye Progression has no prior data. Disc findings include normal observations. Macula : normal observations. Vessels : tortuous vessels, attenuated. Periphery : RPE abnormality (Pigmented choroidal lesion 1030 midzone approx 2DD with +drusen).   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 Pseudophakia of both eyes  Z96.1      Choroidal Nevus, OS  - ~2DD pigmented lesion at 1015 midzone  - no visual symptoms, SRF or orange pigment  - +drusen  - thickness < 2mm  - baseline Optos images obtained today, 11.12.24  - discussed findings, prognosis  - recommend monitoring  - f/u 6-9 months, DFE, OCT  2. Pseudophakia OU  - s/p CE/IOL OU (Dr. Nile Riggs)  - IOLs in good position, doing well  - monitor  Ophthalmic Meds Ordered this visit:  No orders of the defined types were placed in this encounter.    Return for f/u choroidal nevus OS, DFE, OCT,  OPTOS colors.  There are no Patient Instructions on file for this visit.  Explained the diagnoses, plan, and follow up with the patient and they expressed understanding.  Patient expressed understanding of the importance of proper follow up care.   This document serves as a record of services personally performed by Karie Chimera, MD, PhD. It was created on their behalf by Charlette Caffey, COT an ophthalmic technician. The creation of this record is the provider's dictation and/or activities during the visit.    Electronically signed by:  Charlette Caffey, COT  11/07/23 4:29 PM  This document serves as a record of services personally performed by Karie Chimera, MD, PhD. It was created on their behalf by Glee Arvin. Manson Passey, OA an ophthalmic technician. The creation of this record is the provider's dictation and/or activities during the visit.  Electronically signed by: Glee Arvin. Manson Passey, OA 11/07/23 4:29 PM  Karie Chimera, M.D., Ph.D. Diseases & Surgery of the Retina and Vitreous [facility] 11/07/2023  I have reviewed the above documentation for accuracy and completeness, and I agree with the above. Karie Chimera, M.D., Ph.D. 11/07/23 4:29 PM   Abbreviations: M myopia (nearsighted); A astigmatism; H hyperopia (farsighted); P presbyopia; Mrx spectacle prescription;  CTL contact lenses; OD right eye; OS left eye; OU both eyes  XT exotropia; ET esotropia; PEK punctate epithelial keratitis; PEE punctate epithelial erosions; DES dry eye syndrome; MGD meibomian gland dysfunction; ATs artificial tears; PFAT's preservative free artificial tears; NSC nuclear sclerotic cataract; PSC posterior subcapsular cataract; ERM epi-retinal membrane; PVD posterior vitreous detachment; RD retinal detachment; DM diabetes mellitus; DR diabetic retinopathy; NPDR non-proliferative diabetic retinopathy; PDR proliferative diabetic retinopathy; CSME clinically significant macular edema;  DME diabetic macular edema; dbh dot blot hemorrhages; CWS cotton wool spot; POAG primary open angle glaucoma; C/D cup-to-disc ratio; HVF humphrey visual field; GVF goldmann visual field; OCT optical coherence tomography; IOP intraocular pressure; BRVO Branch retinal vein occlusion; CRVO central retinal vein occlusion; CRAO central retinal artery occlusion; BRAO branch retinal artery occlusion; RT retinal tear; SB scleral buckle; PPV pars plana vitrectomy; VH Vitreous hemorrhage; PRP panretinal laser photocoagulation; IVK intravitreal kenalog; VMT vitreomacular traction; MH Macular hole;  NVD neovascularization of the disc; NVE neovascularization elsewhere; AREDS age related eye disease study; ARMD age related macular degeneration; POAG primary open angle glaucoma; EBMD epithelial/anterior basement membrane dystrophy; ACIOL anterior chamber intraocular lens; IOL intraocular lens; PCIOL posterior chamber intraocular lens; Phaco/IOL phacoemulsification with intraocular lens placement; PRK photorefractive keratectomy; LASIK laser assisted in situ keratomileusis; HTN hypertension; DM diabetes mellitus; COPD chronic obstructive pulmonary disease

## 2023-10-26 ENCOUNTER — Other Ambulatory Visit (HOSPITAL_BASED_OUTPATIENT_CLINIC_OR_DEPARTMENT_OTHER): Payer: Self-pay

## 2023-10-26 MED ORDER — METFORMIN HCL 500 MG PO TABS
ORAL_TABLET | ORAL | 0 refills | Status: DC
  Filled 2023-10-26: qty 90, 55d supply, fill #0

## 2023-10-30 ENCOUNTER — Other Ambulatory Visit (HOSPITAL_BASED_OUTPATIENT_CLINIC_OR_DEPARTMENT_OTHER): Payer: Self-pay

## 2023-10-31 ENCOUNTER — Ambulatory Visit: Payer: Medicare Other | Attending: Orthopedic Surgery | Admitting: Physical Therapy

## 2023-10-31 ENCOUNTER — Encounter: Payer: Self-pay | Admitting: Physical Therapy

## 2023-10-31 VITALS — BP 123/65 | HR 54

## 2023-10-31 DIAGNOSIS — R2681 Unsteadiness on feet: Secondary | ICD-10-CM

## 2023-10-31 DIAGNOSIS — R42 Dizziness and giddiness: Secondary | ICD-10-CM

## 2023-10-31 NOTE — Therapy (Signed)
OUTPATIENT PHYSICAL THERAPY VESTIBULAR TREATMENT     Patient Name: Wendy Joyce MRN: [id number] DOB:1946/10/28, 77 y.o., female Today's Date: 10/31/2023  END OF SESSION:  PT End of Session - 10/31/23 1151     Visit Number 2    Number of Visits 10    Date for PT Re-Evaluation 12/12/23    Authorization Type [employer]    PT Start Time 1150    PT Stop Time 1232    PT Time Calculation (min) 42 min    Equipment Utilized During Treatment Gait belt    Activity Tolerance Patient tolerated treatment well   limited by dizziness   Behavior During Therapy Anxious;WFL for tasks assessed/performed             Past Medical History:  Diagnosis Date   Abdominal pain, generalized 01/29/2015   Abnormal thyroid function test 02/25/2016   ADHD (attention deficit hyperactivity disorder)    Allergic rhinitis, unspecified 01/29/2015   Allergy    Anemia    DOE   Ankle sprain 03/16/2016   Anxiety 01/29/2015   Arthritis    hands   Chronic kidney disease 2012   kidney stones   Constipation 01/29/2015   Decreased libido 05/02/2018   Disorder of the skin and subcutaneous tissue, unspecified 01/29/2015   DJD (degenerative joint disease)    hand   Dysthymia    Endometriosis    External otitis    Family history of coronary artery disease 05/02/2018   Fatigue due to excessive exertion 02/24/2016   Fever blister    Fibromyalgia 01/29/2015   Frequency of micturition 01/29/2015   Functional dyspepsia 01/29/2015   Gastro-esophageal reflux disease without esophagitis 01/29/2015   Headache(784.0)    High cholesterol    Hyperlipidemia 09/12/2016   Hypersomnia    Hypothyroidism    IBS (irritable bowel syndrome) 05/02/2018   Insomnia    Internal hemorrhoids    Irritable 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 01/29/2015   Labial pain 11/30/2016   Lactose intolerance 12/14/2016   Lactose intolerance in adult 09/12/2016   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   Left ureteral  stone    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   Major depressive disorder, single episode, unspecified    Migraine with aura, not intractable, without status migrainosus    Mixed hyperlipidemia    Mouth pain 01/07/2016   Osteoarthritis of hip    Osteopenia determined by x-ray 03/02/2017   Personal history of estrogen therapy    Phlebitis and thrombophlebitis of the leg    Pityriasis alba 01/29/2015   Pure hypercholesterolemia 06/29/2016   Rectum pain 09/12/2016   Recurrent sinus infections 02/24/2016   Rhinosinusitis 01/07/2016   Rosacea    Sinusitis 12/24/2015   Tinnitus of left ear 01/29/2015   Umbilical hernia without obstruction and without gangrene 01/29/2015   Umbilical pain 05/02/2018   Unspecified urinary incontinence 01/07/2016   Urticaria    UTI (urinary tract infection)    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   Vitamin D deficiency    Past Surgical History:  Procedure Laterality Date   BREAST ENHANCEMENT SURGERY     BREAST REDUCTION SURGERY Bilateral 2018   COLONOSCOPY     COLONOSCOPY WITH PROPOFOL N/A 02/25/2013   Procedure: COLONOSCOPY WITH PROPOFOL;  Surgeon: Charolett Bumpers, MD;  Location: [facility];  Service: Endoscopy;  Laterality: N/A;   left hand surgery  12/28/2022   mini facelift  2000   NASAL SINUS SURGERY  REDUCTION MAMMAPLASTY     ROTATOR CUFF REPAIR     Right   ROTATOR CUFF REPAIR     Left   SEPTOPLASTY WITH ETHMOIDECTOMY, AND MAXILLARY ANTROSTOMY Bilateral 2006   TOTAL ABDOMINAL HYSTERECTOMY     ovaries remain   TUBAL LIGATION     umbicial hernia     Patient Active Problem List   Diagnosis Date Noted   Right lateral abdominal pain 05/17/2023   RUQ abdominal pain 04/12/2023   Depression, recurrent (HCC) 12/25/2022   Preoperative clearance 11/23/2022   Encounter for counseling 10/14/2022   Influenza 09/18/2022   Pain of left hand 06/29/2022   Vasomotor symptoms due to menopause 05/30/2022    Pre-diabetes 05/30/2022   Anxiety disorder 03/17/2022   Allergic rhinitis 03/17/2022   Constipation 03/17/2022   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   Exertional dyspnea 03/17/2022   Family history of coronary artery disease 03/17/2022   Weakness with dizziness 03/17/2022   Fever blister 03/17/2022   Frequency of micturition 03/17/2022   Irritable bowel syndrome 03/17/2022   Lactose intolerance 03/17/2022   Left anterior fascicular block 03/17/2022   Lichen planus 03/17/2022   Panniculitis 03/17/2022   Refractory migraine with aura 03/17/2022   Statin not tolerated 03/17/2022   Thrombophlebitis of superficial veins of lower extremity 03/17/2022   Bilateral tinnitus 03/17/2022   Umbilical hernia 03/17/2022   Urinary incontinence 03/17/2022   Urticaria due to drug allergy 03/17/2022   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 Feels generally dizzy a lot. Also has ringing in the ears. Still having a sloshing in her head feeling. Can walk now without having to use a cane, but still feels unsteady.   Pt accompanied by: self  PERTINENT HISTORY: bladder surgery September 2024 for most recent (see below for precautions), prior history of concussion 2021, PTSD (reports triggers can include loud dizzy space), and anxiety in large groups   PAIN:  Are you having pain? No Has been  having headaches, but none right now.   PRECAUTIONS: Fall and Other: no core work for at least one more week   RED FLAGS: Bowel or bladder incontinence: Yes: bladder incontinence stress and requires use of internal sling to support, managed and consistent with known medical history     WEIGHT BEARING RESTRICTIONS: Yes nothing over 20 lbs for lifting   FALLS: Has patient fallen in last 6 months? Yes. Number of falls 1 fall with concussion and then some close calls about 6 since then  LIVING ENVIRONMENT: Lives with: lives with their spouse Lives in: House/apartment Stairs: Yes - with railing about 15-20 to go up and down  Has following equipment at home: Single point cane  PLOF: Reports being fulling independent, works as an Tree surgeon   PATIENT GOALS: "Patient wants to feel secure when walking in store (not feeling like needs a cart) and would like to get back to dance, yoga, and exercise."   VITALS:  Vitals:   10/31/23  1157  BP: 123/65  Pulse: (!) 54    OBJECTIVE:  Note: Objective measures were completed at Evaluation unless otherwise noted.  DIAGNOSTIC FINDINGS:   MRI Brain 08/14/2023 IMPRESSION: No evidence of acute abnormality.  COGNITION: Overall cognitive status: Within functional limits for tasks assessed   SENSATION: WFL  POSTURE:  Grossly WFL  Cervical ROM:    Active A/PROM (deg) eval  Flexion WFL  Extension WFL  Right lateral flexion WFL  Left lateral flexion WFL  Right rotation WFL  Left rotation WFL  (Blank rows = not tested)  GAIT: Gait pattern:  hesitancy with initial few steps, cervical guarding and slow on turns Distance walked: short clinic distances < 60 feet Assistive device utilized: None Level of assistance: SBA  PATIENT SURVEYS:   [place] Post Concussion Symptoms Questionnaire:  Compared to before the accident, do you now (last 24 hours) suffer from:  Headaches 1 = no more of a problem  Feeling of Dizziness 3 = moderate problem   Nausea and/or vomiting 2 = mild problem  RPQ-3 (total of first 3 items) 6     Noise sensitivity (easily upset by loud noises) 3 = moderate problem  Sleep disturbances 3 = moderate problem  Fatigue, tiring more easily 4 = severe problem  Being irritable, easily angered: 4 = severe problem  Feeling depressed or tearful 4 = severe problem  Feeling frustrated or impatient 4 = severe problem  Forgetfulness, poor memory 3 = moderate problem  Poor concentration 3 = moderate problem  Taking longer to think 3 = moderate problem  Blurred vision 3 = moderate problem  Light sensitivity (easily upset by bright light) 4 = severe problem  Double vision 1 = no more of a problem  Restlessness 1 = no more of a problem  RPQ-13 (total for next 13 items) 40    Written in components on RPQ: Trouble turning as walk: 3 Trouble seated to up and walk: 3  VESTIBULAR ASSESSMENT:  GENERAL OBSERVATION: reading glasses not donned during session, ambulates without AD, prefers room dimmed during session   SYMPTOM BEHAVIOR:  Subjective history: see above Non-Vestibular symptoms: changes in hearing, diplopia, neck pain, headaches, tinnitus, nausea/vomiting, migraine symptoms, and double vision when watching TV sometimes Type of dizziness: Blurred Vision, Diplopia, Imbalance (Disequilibrium), Unsteady with head/body turns, Lightheadedness/Faint, "Funny feeling in the head", "World moves", and "Swimmyheaded"  Frequency: every day  Duration: short time (seconds and usually when turning)    Aggravating factors: turning   Relieving factors: sitting down  Progression of symptoms: better  Given time constraints of session; unable to assess further   VESTIBULAR TREATMENT:                                                                                                    Finished vestibular assessment from eval:    OCULOMOTOR EXAM:   Ocular Alignment: normal   Ocular ROM: No Limitations   Spontaneous Nystagmus:  absent   Gaze-Induced Nystagmus: absent   Smooth Pursuits: intact and reports feeling a little dizziness when going to L/superior side   Saccades:  intact   Convergence/Divergence: 4 cm, felt a little off afterwards   VESTIBULAR - OCULAR REFLEX:    Slow VOR: Normal and Comment: 3/10 dizziness, and incr ringing in ears    VOR Cancellation: Normal, pt reporting doing better with this one than slow VOR    Head-Impulse Test: HIT Right: negative HIT Left: positive  Incr dizziness 4/10 when going to the L      POSITIONAL TESTING: Right Dix-Hallpike: no nystagmus Left Dix-Hallpike: no nystagmus    MOTION SENSITIVITY:    Motion Sensitivity Quotient  Intensity: 0 = none, 1 = Lightheaded, 2 = Mild, 3 = Moderate, 4 = Severe, 5 = Vomiting  Intensity  1. Sitting to supine 0  2. Supine to L side 1  3. Supine to R side 0  4. Supine to sitting Felt a little pressure rush when sitting up   5. L Hallpike-Dix 2  6. Up from L  2  7. R Hallpike-Dix 0  8. Up from R  1  9. Sitting, head  tipped to L knee 2, noted ears were ringing louder when going down  10. Head up from L  knee 2  11. Sitting, head  tipped to R knee 2  12. Head up from R  knee 2  13. Sitting head turns x5 2-3  14.Sitting head nods x5 1-2, felt a "sloshing" feeling in head afterwards  15. In stance, 180  turn to L  3  16. In stance, 180  turn to R 3    Notices feeling more off when standing.   Felt a little nauseous after turning, provided pt with some water.      M-CTSIB  Condition 1: Firm Surface, EO 30 Sec, Normal Sway  Condition 2: Firm Surface, EC 30 Sec, Mild/Moderate Sway, incr forward lean   Condition 3: Foam Surface, EO 25 Sec, Mild Sway  Condition 4: Foam Surface, EC 1-2 Sec      PATIENT EDUCATION: Education details: Results of further vestibular assessment, concussion symptoms, will provide HEP at next session  Person educated: Patient Education method: Explanation and Verbal cues Education  comprehension: verbalized understanding and needs further education  HOME EXERCISE PROGRAM:  GOALS: Goals reviewed with patient? Yes  SHORT TERM GOALS: Target date: 11/21/2023  Patient will demonstrate independence with initial HEP to continue to progress between physical therapy sessions.   Baseline: To be provided Goal status: INITIAL  2.  Patient will improve RPQ-3 score to </= 4 to indicate reduced severity of post-concussive symptoms to progress towards PLOF.   Baseline: 6 Goal status: INITIAL  3.  Pt will perform items on MSQ as 1-2/5 or less in order to demo improved motion sensitivity.  Baseline:  Goal status: INITIAL  4.  SOT to be assessed and LTG written Baseline: To be assessed  Goal status: INITIAL  LONG TERM GOALS: Target date: 12/12/2023  Patient will report demonstrate independence with final HEP in order to maintain current gains and continue to progress after physical therapy discharge.   Baseline: To be provided Goal status: INITIAL  2.  Patient will improve RPQ-13 score to </= 3 per item to indicate reduced severity of post-concussive symptoms to progress towards PLOF.   Baseline: 4/5 greatest Goal status: INITIAL  3.  Pt will perform head motions and turning to R/L on MSQ to a 1/5 or less to demo improved dizziness  Baseline: 2-3/5 dizziness  Goal status: INITIAL  4.  SOT to be assessed and LTG written  Baseline: To be assessed  Goal status: INITIAL  5.  Patient will reduce score on written in components on [place] (Trouble turning as walk and Trouble seated to up and walk) to 2/4 or less to indicate reduction in concussion symptoms. Baseline: 3/4 Goal status: INITIAL  ASSESSMENT:  CLINICAL IMPRESSION: Today's skilled session focused on finishing vestibular assessment from evaluation for concussion. Findings during assessment: positive HIT to the L indicating impaired VOR, motion sensitivity/dizziness based on MSQ tasks (most with head motions  and turns), and significantly decr vestibular input for balance based on mCTSIB. Pt more reliant on vision for balance. Pt negative for positional testing. Intermittent rest breaks in between tasks due to dizziness. Will provide initial HEP at next session. STG/LTGs updated as appropriate.    OBJECTIVE IMPAIRMENTS: Abnormal gait, decreased balance, difficulty walking, dizziness, and pain.   ACTIVITY LIMITATIONS: carrying, lifting, bending, standing, transfers, and locomotion level  PARTICIPATION LIMITATIONS: meal prep, shopping, community activity, and yard work  PERSONAL FACTORS: Past/current experiences, Time since onset of injury/illness/exacerbation, and 3+ comorbidities: see above  are also affecting patient's functional outcome.   REHAB POTENTIAL: Good  CLINICAL DECISION MAKING: Evolving/moderate complexity  EVALUATION COMPLEXITY: Moderate   PLAN:  PT FREQUENCY: 2x/week  PT DURATION: 7 weeks  PLANNED INTERVENTIONS: [id number]- PT Re-evaluation, 97110-Therapeutic exercises, 97530- Therapeutic activity, 97112- Neuromuscular re-education, 97535- Self Care, [id number]- Manual therapy, [id number]- Gait training, and [phone]- [place] repositioning  PLAN FOR NEXT SESSION: initiate HEP - VOR, items on MSQ, balance with EC/vestibular input, gentle head movements  assess SOT (in future session),  behavioral health if helpful  Drake Leach, PT, DPT 10/31/2023, 12:50 PM

## 2023-11-02 ENCOUNTER — Encounter: Payer: Self-pay | Admitting: Physical Therapy

## 2023-11-02 ENCOUNTER — Ambulatory Visit: Payer: Medicare Other | Admitting: Physical Therapy

## 2023-11-02 ENCOUNTER — Other Ambulatory Visit (HOSPITAL_BASED_OUTPATIENT_CLINIC_OR_DEPARTMENT_OTHER): Payer: Self-pay

## 2023-11-02 DIAGNOSIS — R42 Dizziness and giddiness: Secondary | ICD-10-CM | POA: Diagnosis not present

## 2023-11-02 DIAGNOSIS — R2681 Unsteadiness on feet: Secondary | ICD-10-CM

## 2023-11-02 NOTE — Therapy (Signed)
OUTPATIENT PHYSICAL THERAPY VESTIBULAR TREATMENT     Patient Name: Wendy Joyce MRN: [id number] DOB:02/25/1946, 77 y.o., female Today's Date: 11/02/2023  END OF SESSION:  PT End of Session - 11/02/23 1925     Visit Number 3    Number of Visits 10    Date for PT Re-Evaluation 12/12/23    Authorization Type [employer]    PT Start Time 1059    PT Stop Time 1145    PT Time Calculation (min) 46 min    Activity Tolerance Patient tolerated treatment well   limited by dizziness   Behavior During Therapy Anxious;WFL for tasks assessed/performed              Past Medical History:  Diagnosis Date   Abdominal pain, generalized 01/29/2015   Abnormal thyroid function test 02/25/2016   ADHD (attention deficit hyperactivity disorder)    Allergic rhinitis, unspecified 01/29/2015   Allergy    Anemia    DOE   Ankle sprain 03/16/2016   Anxiety 01/29/2015   Arthritis    hands   Chronic kidney disease 2012   kidney stones   Constipation 01/29/2015   Decreased libido 05/02/2018   Disorder of the skin and subcutaneous tissue, unspecified 01/29/2015   DJD (degenerative joint disease)    hand   Dysthymia    Endometriosis    External otitis    Family history of coronary artery disease 05/02/2018   Fatigue due to excessive exertion 02/24/2016   Fever blister    Fibromyalgia 01/29/2015   Frequency of micturition 01/29/2015   Functional dyspepsia 01/29/2015   Gastro-esophageal reflux disease without esophagitis 01/29/2015   Headache(784.0)    High cholesterol    Hyperlipidemia 09/12/2016   Hypersomnia    Hypothyroidism    IBS (irritable bowel syndrome) 05/02/2018   Insomnia    Internal hemorrhoids    Irritable 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 01/29/2015   Labial pain 11/30/2016   Lactose intolerance 12/14/2016   Lactose intolerance in adult 09/12/2016   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   Left ureteral stone    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    Major depressive disorder, single episode, unspecified    Migraine with aura, not intractable, without status migrainosus    Mixed hyperlipidemia    Mouth pain 01/07/2016   Osteoarthritis of hip    Osteopenia determined by x-ray 03/02/2017   Personal history of estrogen therapy    Phlebitis and thrombophlebitis of the leg    Pityriasis alba 01/29/2015   Pure hypercholesterolemia 06/29/2016   Rectum pain 09/12/2016   Recurrent sinus infections 02/24/2016   Rhinosinusitis 01/07/2016   Rosacea    Sinusitis 12/24/2015   Tinnitus of left ear 01/29/2015   Umbilical hernia without obstruction and without gangrene 01/29/2015   Umbilical pain 05/02/2018   Unspecified urinary incontinence 01/07/2016   Urticaria    UTI (urinary tract infection)    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   Vitamin D deficiency    Past Surgical History:  Procedure Laterality Date   BREAST ENHANCEMENT SURGERY     BREAST REDUCTION SURGERY Bilateral 2018   COLONOSCOPY     COLONOSCOPY WITH PROPOFOL N/A 02/25/2013   Procedure: COLONOSCOPY WITH PROPOFOL;  Surgeon: Charolett Bumpers, MD;  Location: [facility];  Service: Endoscopy;  Laterality: N/A;   left hand surgery  12/28/2022   mini facelift  2000   NASAL SINUS SURGERY     REDUCTION MAMMAPLASTY     ROTATOR  CUFF REPAIR     Right   ROTATOR CUFF REPAIR     Left   SEPTOPLASTY WITH ETHMOIDECTOMY, AND MAXILLARY ANTROSTOMY Bilateral 2006   TOTAL ABDOMINAL HYSTERECTOMY     ovaries remain   TUBAL LIGATION     umbicial hernia     Patient Active Problem List   Diagnosis Date Noted   Right lateral abdominal pain 05/17/2023   RUQ abdominal pain 04/12/2023   Depression, recurrent (HCC) 12/25/2022   Preoperative clearance 11/23/2022   Encounter for counseling 10/14/2022   Influenza 09/18/2022   Pain of left hand 06/29/2022   Vasomotor symptoms due to menopause 05/30/2022   Pre-diabetes 05/30/2022   Anxiety disorder  03/17/2022   Allergic rhinitis 03/17/2022   Constipation 03/17/2022   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   Exertional dyspnea 03/17/2022   Family history of coronary artery disease 03/17/2022   Weakness with dizziness 03/17/2022   Fever blister 03/17/2022   Frequency of micturition 03/17/2022   Irritable bowel syndrome 03/17/2022   Lactose intolerance 03/17/2022   Left anterior fascicular block 03/17/2022   Lichen planus 03/17/2022   Panniculitis 03/17/2022   Refractory migraine with aura 03/17/2022   Statin not tolerated 03/17/2022   Thrombophlebitis of superficial veins of lower extremity 03/17/2022   Bilateral tinnitus 03/17/2022   Umbilical hernia 03/17/2022   Urinary incontinence 03/17/2022   Urticaria due to drug allergy 03/17/2022   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 03/17/2022   Sexual dysfunction 03/17/2022   Imbalance 03/17/2022   Fibromyalgia 09/21/2021   Hyperlipidemia 09/21/2021   Hashimoto's thyroiditis 03/31/2021   Hypothyroidism 09/27/2013    PCP: Alyson Reedy, FNP REFERRING PROVIDER: Butch Penny, NP  REFERRING DIAG: 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  Pt requests to be treated in room with lights dimmed due to light and noise sensitivity  Pt accompanied by: self  PERTINENT HISTORY: bladder surgery September 2024 for most recent (see below for precautions), prior history of concussion 2021, PTSD (reports triggers can include loud dizzy space), and anxiety in large groups   PAIN:  Are you having pain? Headache at this time - intensity 3-4/10   PRECAUTIONS: Fall and Other: no core work for at least one more  week   RED FLAGS: Bowel or bladder incontinence: Yes: bladder incontinence stress and requires use of internal sling to support, managed and consistent with known medical history     WEIGHT BEARING RESTRICTIONS: Yes nothing over 20 lbs for lifting   FALLS: Has patient fallen in last 6 months? Yes. Number of falls 1 fall with concussion and then some close calls about 6 since then  LIVING ENVIRONMENT: Lives with: lives with their spouse Lives in: House/apartment Stairs: Yes - with railing about 15-20 to go up and down  Has following equipment at home: Single point cane  PLOF: Reports being fulling independent, works as an Tree surgeon   PATIENT GOALS: "Patient wants to feel secure when walking in store (not feeling like needs a cart) and would like to get back to dance, yoga, and exercise."   VITALS:  There were no vitals filed for this visit.   OBJECTIVE:  Note: Objective measures were completed at Evaluation unless otherwise noted.  DIAGNOSTIC FINDINGS:  MRI Brain 08/14/2023 IMPRESSION: No evidence of acute abnormality.  COGNITION: Overall cognitive status: Within functional limits for tasks assessed   SENSATION: WFL  POSTURE:  Grossly WFL  Cervical ROM:    Active A/PROM (deg) eval  Flexion WFL  Extension WFL  Right lateral flexion WFL  Left lateral flexion WFL  Right rotation WFL  Left rotation WFL  (Blank rows = not tested)  GAIT: Gait pattern:  hesitancy with initial few steps, cervical guarding and slow on turns Distance walked: short clinic distances < 60 feet Assistive device utilized: None Level of assistance: SBA  PATIENT SURVEYS:   [place] Post Concussion Symptoms Questionnaire:  Compared to before the accident, do you now (last 24 hours) suffer from:  Headaches 1 = no more of a problem  Feeling of Dizziness 3 = moderate problem  Nausea and/or vomiting 2 = mild problem  RPQ-3 (total of first 3 items) 6     Noise sensitivity (easily upset by  loud noises) 3 = moderate problem  Sleep disturbances 3 = moderate problem  Fatigue, tiring more easily 4 = severe problem  Being irritable, easily angered: 4 = severe problem  Feeling depressed or tearful 4 = severe problem  Feeling frustrated or impatient 4 = severe problem  Forgetfulness, poor memory 3 = moderate problem  Poor concentration 3 = moderate problem  Taking longer to think 3 = moderate problem  Blurred vision 3 = moderate problem  Light sensitivity (easily upset by bright light) 4 = severe problem  Double vision 1 = no more of a problem  Restlessness 1 = no more of a problem  RPQ-13 (total for next 13 items) 40    Written in components on RPQ: Trouble turning as walk: 3 Trouble seated to up and walk: 3  VESTIBULAR ASSESSMENT:  GENERAL OBSERVATION: reading glasses not donned during session, ambulates without AD, prefers room dimmed during session   SYMPTOM BEHAVIOR:  Subjective history: see above Non-Vestibular symptoms: changes in hearing, diplopia, neck pain, headaches, tinnitus, nausea/vomiting, migraine symptoms, and double vision when watching TV sometimes Type of dizziness: Blurred Vision, Diplopia, Imbalance (Disequilibrium), Unsteady with head/body turns, Lightheadedness/Faint, "Funny feeling in the head", "World moves", and "Swimmyheaded"  Frequency: every day  Duration: short time (seconds and usually when turning)    Aggravating factors: turning   Relieving factors: sitting down  Progression of symptoms: better  Given time constraints of session; unable to assess further                                                                                            MOTION SENSITIVITY:    Motion Sensitivity Quotient  Intensity: 0 = none, 1 = Lightheaded, 2 = Mild, 3 = Moderate, 4 = Severe, 5 = Vomiting  Intensity  1. Sitting to supine 0  2. Supine to L side 1  3. Supine to R side 0  4. Supine to sitting Felt a little pressure rush when sitting up   5.  L [place]Dix 2  6. Up from L  2  7. R Hallpike-Dix 0  8. Up from R  1  9.  Sitting, head  tipped to L knee 2, noted ears were ringing louder when going down  10. Head up from L  knee 2  11. Sitting, head  tipped to R knee 2  12. Head up from R  knee 2  13. Sitting head turns x5 2-3  14.Sitting head nods x5 1-2, felt a "sloshing" feeling in head afterwards  15. In stance, 180  turn to L  3  16. In stance, 180  turn to R 3     Today's Treatment:  11-02-23  X1 viewing - target on plain background, pt in standing position - target 5' away; 30 secs x 2 reps horizontal;  pt reported increased dizziness upon completion of each rep;  30 secs x 2 reps vertical with pt reporting increased dizziness after completion;  pt also reported increased tinnitus in ears after exercise completed  For Habituation; pt performed sit to stand - pivot turn toward Lt side and then to Rt side, return to seated position - pt performed 5 reps with pt reporting increased symptoms with turning toward Lt side  In seated position - Pt performed horizontal head turns 5 reps, vertical head turns 5 reps; pt reported increased dizziness and tinnitus in ears  Standing Balance:  (In corner) Surface: Floor Position: Narrow Base of Support Completed with: Eyes Open and Eyes Closed; Head Turns x 5 Reps and Head Nods x 5 Reps  TherEx: Cervical rotation stretch with use of towel for gentle overpressure - 2 reps to each side - 15 sec hold Cervical lateral flexor stretch with use of hand for increased stretch - 15 sec hold Rt side and Lt side; pt reported increased dizziness after this stretch so pt was informed that lateral flexion only could be performed without use of UE providing increased stretch/pressure   Access Code: [email or web address] URL: [email or web address] Date: 11/02/2023 Prepared by: Maebelle Munroe  Exercises - Standing Balance in Corner with Eyes Closed  - 1 x daily - 7 x weekly - 1 sets - 1-2 reps  - 15 sec hold - Standing balance with EYES OPEN  - 1 x daily - 7 x weekly - 1 sets - 1 reps - 15 hold - Single Leg Stance with Support  - 1 x daily - 7 x weekly - 1 sets - 1-2 reps - 10 sec hold - Standing Marching  - 1 x daily - 7 x weekly - 1 sets - 10 reps     PATIENT EDUCATION: Education details: [employer] [email or web address] Person educated: Patient Education method: Explanation and Verbal cues, demonstration, handouts Education comprehension: verbalized understanding and needs further education  HOME EXERCISE PROGRAM:  GOALS: Goals reviewed with patient? Yes  SHORT TERM GOALS: Target date: 11/21/2023  Patient will demonstrate independence with initial HEP to continue to progress between physical therapy sessions.   Baseline: To be provided Goal status: INITIAL  2.  Patient will improve RPQ-3 score to </= 4 to indicate reduced severity of post-concussive symptoms to progress towards PLOF.   Baseline: 6 Goal status: INITIAL  3.  Pt will perform items on MSQ as 1-2/5 or less in order to demo improved motion sensitivity.  Baseline:  Goal status: INITIAL  4.  SOT to be assessed and LTG written Baseline: To be assessed  Goal status: INITIAL  LONG TERM GOALS: Target date: 12/12/2023  Patient will report demonstrate independence with final HEP in order to maintain current gains and continue to progress after physical therapy discharge.   Baseline: To  be provided Goal status: INITIAL  2.  Patient will improve RPQ-13 score to </= 3 per item to indicate reduced severity of post-concussive symptoms to progress towards PLOF.   Baseline: 4/5 greatest Goal status: INITIAL  3.  Pt will perform head motions and turning to R/L on MSQ to a 1/5 or less to demo improved dizziness  Baseline: 2-3/5 dizziness  Goal status: INITIAL  4.  SOT to be assessed and LTG written Baseline: To be assessed  Goal status: INITIAL  5.  Patient will reduce score on written in components on [place] (Trouble  turning as walk and Trouble seated to up and walk) to 2/4 or less to indicate reduction in concussion symptoms. Baseline: 3/4 Goal status: INITIAL  ASSESSMENT:  CLINICAL IMPRESSION: PT session focused on establishing HEP for gaze stabilization, habituation, and increased vestibular input in maintaining balance.  Pt able to tolerate x1 viewing in standing, 30 secs duration for horizontal and vertical head turns, with pt reporting greater dizziness provoked with horizontal than with vertical head turns.  Pt also reported increased tinnitus in Lt ear provoked with exercises.  Pt performed standing on floor with EO and with EC:  mild sway and dizziness provoked with standing with EC with head turns.  Standing on compliant surface not attempted in today's session due to postural sway and c/o dizziness with standing on floor - plan to progress to compliant surface in next session.  Pt requested treatment in private room due to light and noise sensitivity.  Cont with POC.   OBJECTIVE IMPAIRMENTS: Abnormal gait, decreased balance, difficulty walking, dizziness, and pain.   ACTIVITY LIMITATIONS: carrying, lifting, bending, standing, transfers, and locomotion level  PARTICIPATION LIMITATIONS: meal prep, shopping, community activity, and yard work  PERSONAL FACTORS: Past/current experiences, Time since onset of injury/illness/exacerbation, and 3+ comorbidities: see above  are also affecting patient's functional outcome.   REHAB POTENTIAL: Good  CLINICAL DECISION MAKING: Evolving/moderate complexity  EVALUATION COMPLEXITY: Moderate   PLAN:  PT FREQUENCY: 2x/week  PT DURATION: 7 weeks  PLANNED INTERVENTIONS: 97164- PT Re-evaluation, 97110-Therapeutic exercises, 97530- Therapeutic activity, 97112- Neuromuscular re-education, 97535- Self Care, [id number]- Manual therapy, [phone]- Gait training, and [phone]- [place] repositioning  PLAN FOR NEXT SESSION: check HEP issued on 11-02-23 -- progress to compliant  surface if pt able to tolerate; continue vestibular exercises, habituation, etc.   assess SOT (in future session),  behavioral health if helpful  Ronae Noell, Donavan Burnet, PT 11/02/2023, 7:27 PM

## 2023-11-03 DIAGNOSIS — M1812 Unilateral primary osteoarthritis of first carpometacarpal joint, left hand: Secondary | ICD-10-CM | POA: Diagnosis not present

## 2023-11-04 ENCOUNTER — Other Ambulatory Visit (HOSPITAL_BASED_OUTPATIENT_CLINIC_OR_DEPARTMENT_OTHER): Payer: Self-pay

## 2023-11-06 ENCOUNTER — Ambulatory Visit: Payer: Medicare Other | Admitting: Physical Therapy

## 2023-11-06 ENCOUNTER — Encounter: Payer: Self-pay | Admitting: Physical Therapy

## 2023-11-06 VITALS — BP 117/57 | HR 53

## 2023-11-06 DIAGNOSIS — R2681 Unsteadiness on feet: Secondary | ICD-10-CM

## 2023-11-06 DIAGNOSIS — R42 Dizziness and giddiness: Secondary | ICD-10-CM | POA: Diagnosis not present

## 2023-11-06 NOTE — Therapy (Signed)
OUTPATIENT PHYSICAL THERAPY VESTIBULAR TREATMENT     Patient Name: Wendy Joyce MRN: [id number] DOB:1946/08/04, 77 y.o., female Today's Date: 11/06/2023  END OF SESSION:  PT End of Session - 11/06/23 1505     Visit Number 4    Number of Visits 10    Date for PT Re-Evaluation 12/12/23    Authorization Type [employer]    PT Start Time 1505    PT Stop Time 1550    PT Time Calculation (min) 45 min    Equipment Utilized During Treatment Gait belt    Activity Tolerance Patient tolerated treatment well    Behavior During Therapy Anxious;WFL for tasks assessed/performed              Past Medical History:  Diagnosis Date   Abdominal pain, generalized 01/29/2015   Abnormal thyroid function test 02/25/2016   ADHD (attention deficit hyperactivity disorder)    Allergic rhinitis, unspecified 01/29/2015   Allergy    Anemia    DOE   Ankle sprain 03/16/2016   Anxiety 01/29/2015   Arthritis    hands   Chronic kidney disease 2012   kidney stones   Constipation 01/29/2015   Decreased libido 05/02/2018   Disorder of the skin and subcutaneous tissue, unspecified 01/29/2015   DJD (degenerative joint disease)    hand   Dysthymia    Endometriosis    External otitis    Family history of coronary artery disease 05/02/2018   Fatigue due to excessive exertion 02/24/2016   Fever blister    Fibromyalgia 01/29/2015   Frequency of micturition 01/29/2015   Functional dyspepsia 01/29/2015   Gastro-esophageal reflux disease without esophagitis 01/29/2015   Headache(784.0)    High cholesterol    Hyperlipidemia 09/12/2016   Hypersomnia    Hypothyroidism    IBS (irritable bowel syndrome) 05/02/2018   Insomnia    Internal hemorrhoids    Irritable 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 01/29/2015   Labial pain 11/30/2016   Lactose intolerance 12/14/2016   Lactose intolerance in adult 09/12/2016   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   Left ureteral stone    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   Major depressive disorder, single episode, unspecified    Migraine with aura, not intractable, without status migrainosus    Mixed hyperlipidemia    Mouth pain 01/07/2016   Osteoarthritis of hip    Osteopenia determined by x-ray 03/02/2017   Personal history of estrogen therapy    Phlebitis and thrombophlebitis of the leg    Pityriasis alba 01/29/2015   Pure hypercholesterolemia 06/29/2016   Rectum pain 09/12/2016   Recurrent sinus infections 02/24/2016   Rhinosinusitis 01/07/2016   Rosacea    Sinusitis 12/24/2015   Tinnitus of left ear 01/29/2015   Umbilical hernia without obstruction and without gangrene 01/29/2015   Umbilical pain 05/02/2018   Unspecified urinary incontinence 01/07/2016   Urticaria    UTI (urinary tract infection)    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   Vitamin D deficiency    Past Surgical History:  Procedure Laterality Date   BREAST ENHANCEMENT SURGERY     BREAST REDUCTION SURGERY Bilateral 2018   COLONOSCOPY     COLONOSCOPY WITH PROPOFOL N/A 02/25/2013   Procedure: COLONOSCOPY WITH PROPOFOL;  Surgeon: Charolett Bumpers, MD;  Location: [facility];  Service: Endoscopy;  Laterality: N/A;   left hand surgery  12/28/2022   mini facelift  2000   NASAL SINUS SURGERY     REDUCTION MAMMAPLASTY  ROTATOR CUFF REPAIR     Right   ROTATOR CUFF REPAIR     Left   SEPTOPLASTY WITH ETHMOIDECTOMY, AND MAXILLARY ANTROSTOMY Bilateral 2006   TOTAL ABDOMINAL HYSTERECTOMY     ovaries remain   TUBAL LIGATION     umbicial hernia     Patient Active Problem List   Diagnosis Date Noted   Right lateral abdominal pain 05/17/2023   RUQ abdominal pain 04/12/2023   Depression, recurrent (HCC) 12/25/2022   Preoperative clearance 11/23/2022   Encounter for counseling 10/14/2022   Influenza 09/18/2022   Pain of left hand 06/29/2022   Vasomotor symptoms due to menopause 05/30/2022   Pre-diabetes  05/30/2022   Anxiety disorder 03/17/2022   Allergic rhinitis 03/17/2022   Constipation 03/17/2022   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   Exertional dyspnea 03/17/2022   Family history of coronary artery disease 03/17/2022   Weakness with dizziness 03/17/2022   Fever blister 03/17/2022   Frequency of micturition 03/17/2022   Irritable bowel syndrome 03/17/2022   Lactose intolerance 03/17/2022   Left anterior fascicular block 03/17/2022   Lichen planus 03/17/2022   Panniculitis 03/17/2022   Refractory migraine with aura 03/17/2022   Statin not tolerated 03/17/2022   Thrombophlebitis of superficial veins of lower extremity 03/17/2022   Bilateral tinnitus 03/17/2022   Umbilical hernia 03/17/2022   Urinary incontinence 03/17/2022   Urticaria due to drug allergy 03/17/2022   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 03/17/2022   Sexual dysfunction 03/17/2022   Imbalance 03/17/2022   Fibromyalgia 09/21/2021   Hyperlipidemia 09/21/2021   Hashimoto's thyroiditis 03/31/2021   Hypothyroidism 09/27/2013    PCP: Alyson Reedy, FNP REFERRING PROVIDER: Butch Penny, NP  REFERRING DIAG: 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 Denies falls/near falls.   Pt accompanied by: self  PERTINENT HISTORY: bladder surgery September 2024 for most recent (see below for precautions), prior history of concussion 2021, PTSD (reports triggers can include loud dizzy space), and anxiety in large groups   PAIN:  Are you having pain? Headache at this time - intensity 3-4/10   PRECAUTIONS: Fall and Other: no core work for at least one more  week   RED FLAGS: Bowel or bladder incontinence: Yes: bladder incontinence stress and requires use of internal sling to support, managed and consistent with known medical history     WEIGHT BEARING RESTRICTIONS: Yes nothing over 20 lbs for lifting   FALLS: Has patient fallen in last 6 months? Yes. Number of falls 1 fall with concussion and then some close calls about 6 since then  LIVING ENVIRONMENT: Lives with: lives with their spouse Lives in: House/apartment Stairs: Yes - with railing about 15-20 to go up and down  Has following equipment at home: Single point cane  PLOF: Reports being fulling independent, works as an Tree surgeon   PATIENT GOALS: "Patient wants to feel secure when walking in store (not feeling like needs a cart) and would like to get back to dance, yoga, and exercise."   VITALS:  Vitals:   11/06/23 1512  BP: (!) 117/57  Pulse: (!) 53    OBJECTIVE:  Note: Objective measures were completed at Evaluation unless otherwise noted.  DIAGNOSTIC  FINDINGS:   MRI Brain 08/14/2023 IMPRESSION: No evidence of acute abnormality.  COGNITION: Overall cognitive status: Within functional limits for tasks assessed   SENSATION: WFL  POSTURE:  Grossly WFL  Cervical ROM:    Active A/PROM (deg) eval  Flexion WFL  Extension WFL  Right lateral flexion WFL  Left lateral flexion WFL  Right rotation WFL  Left rotation WFL  (Blank rows = not tested)  GAIT: Gait pattern:  hesitancy with initial few steps, cervical guarding and slow on turns Distance walked: short clinic distances < 60 feet Assistive device utilized: None Level of assistance: SBA  PATIENT SURVEYS:   [place] Post Concussion Symptoms Questionnaire:  Compared to before the accident, do you now (last 24 hours) suffer from:  Headaches 1 = no more of a problem  Feeling of Dizziness 3 = moderate problem  Nausea and/or vomiting 2 = mild problem  RPQ-3 (total of first 3 items) 6     Noise sensitivity  (easily upset by loud noises) 3 = moderate problem  Sleep disturbances 3 = moderate problem  Fatigue, tiring more easily 4 = severe problem  Being irritable, easily angered: 4 = severe problem  Feeling depressed or tearful 4 = severe problem  Feeling frustrated or impatient 4 = severe problem  Forgetfulness, poor memory 3 = moderate problem  Poor concentration 3 = moderate problem  Taking longer to think 3 = moderate problem  Blurred vision 3 = moderate problem  Light sensitivity (easily upset by bright light) 4 = severe problem  Double vision 1 = no more of a problem  Restlessness 1 = no more of a problem  RPQ-13 (total for next 13 items) 40    Written in components on RPQ: Trouble turning as walk: 3 Trouble seated to up and walk: 3  VESTIBULAR ASSESSMENT:  GENERAL OBSERVATION: reading glasses not donned during session, ambulates without AD, prefers room dimmed during session   SYMPTOM BEHAVIOR:  Subjective history: see above Non-Vestibular symptoms: changes in hearing, diplopia, neck pain, headaches, tinnitus, nausea/vomiting, migraine symptoms, and double vision when watching TV sometimes Type of dizziness: Blurred Vision, Diplopia, Imbalance (Disequilibrium), Unsteady with head/body turns, Lightheadedness/Faint, "Funny feeling in the head", "World moves", and "Swimmyheaded"  Frequency: every day  Duration: short time (seconds and usually when turning)    Aggravating factors: turning   Relieving factors: sitting down  Progression of symptoms: better  Given time constraints of session; unable to assess further   MOTION SENSITIVITY:    Motion Sensitivity Quotient  Intensity: 0 = none, 1 = Lightheaded, 2 = Mild, 3 = Moderate, 4 = Severe, 5 = Vomiting  Intensity  1. Sitting to supine 0  2. Supine to L side 1  3. Supine to R side 0  4. Supine to sitting Felt a little pressure rush when sitting up   5. L [place]pike[place]Dix 2  6. Up from L  2  7. R Hallpike-Dix 0  8. Up from R   1  9. Sitting, head  tipped to L knee 2, noted ears were ringing louder when going down  10. Head up from L  knee 2  11. Sitting, head  tipped to R knee 2  12. Head up from R  knee 2  13. Sitting head turns x5 2-3  14.Sitting head nods x5 1-2, felt a "sloshing" feeling in head afterwards  15. In stance, 180  turn to L  3  16. In stance, 180  turn to R 3  Today's Treatment:  11-06-23 Performed in dim room for patient comfort given headache at start of today's session  Vitals:   11/06/23 1512  BP: (!) 117/57  Pulse: (!) 53  Seated on L arm  NMR:  VOR x 1 viewing progression: X1 viewing - target on plain background, pt in standing position - target 5' away; 45 secs x 2 reps horizontal;  patient initially requiring cueing to keep eyes on target, reported mild limitation due to neck pain but improved with cues for small ROM, minor sway but no increase from first rep;  45 secs x 2 reps vertical with pt reporting increased dizziness after completion;  patient reporting vertical easier than horizontal with some ringing in ears, reports increase after second rep on vertical   Glasses donned for out in gym  Standing on foam in corner balance on foam with NBOS with HART chart reading 2 X 2 lines looking up to the R and down to the L 2 X 2 lines looking up to the L and down to the R No major increase in dizziness; initially more unsteady and then gradually improves with use of deep breathing (SBA)  TherEx: Chirp wheel XR for gentle cervical mobility in supine; sustained compression x 5 min with gentle side to side and up and down rolling (trialed the round side and point side - preference towards rounder side); provided printout with information regarding device   PATIENT EDUCATION: Education details: Continue HEP Person educated: Patient Education method: Explanation and Verbal cues, demonstration, handouts Education comprehension: verbalized understanding and needs further  education  HOME EXERCISE PROGRAM: Access Code: [email or web address] URL: [email or web address] Date: 11/02/2023 Prepared by: Maebelle Munroe  Exercises - Standing Balance in Corner with Eyes Closed  - 1 x daily - 7 x weekly - 1 sets - 1-2 reps - 15 sec hold - Standing balance with EYES OPEN  - 1 x daily - 7 x weekly - 1 sets - 1 reps - 15 hold - Single Leg Stance with Support  - 1 x daily - 7 x weekly - 1 sets - 1-2 reps - 10 sec hold - Standing Marching  - 1 x daily - 7 x weekly - 1 sets - 10 reps    GOALS: Goals reviewed with patient? Yes  SHORT TERM GOALS: Target date: 11/21/2023  Patient will demonstrate independence with initial HEP to continue to progress between physical therapy sessions.   Baseline: To be provided Goal status: INITIAL  2.  Patient will improve RPQ-3 score to </= 4 to indicate reduced severity of post-concussive symptoms to progress towards PLOF.   Baseline: 6 Goal status: INITIAL  3.  Pt will perform items on MSQ as 1-2/5 or less in order to demo improved motion sensitivity.  Baseline:  Goal status: INITIAL  4.  SOT to be assessed and LTG written Baseline: To be assessed  Goal status: INITIAL  LONG TERM GOALS: Target date: 12/12/2023  Patient will report demonstrate independence with final HEP in order to maintain current gains and continue to progress after physical therapy discharge.   Baseline: To be provided Goal status: INITIAL  2.  Patient will improve RPQ-13 score to </= 3 per item to indicate reduced severity of post-concussive symptoms to progress towards PLOF.   Baseline: 4/5 greatest Goal status: INITIAL  3.  Pt will perform head motions and turning to R/L on MSQ to a 1/5 or less to demo improved dizziness  Baseline: 2-3/5 dizziness  Goal status: INITIAL  4.  SOT to be assessed and LTG written Baseline: To be assessed  Goal status: INITIAL  5.  Patient will reduce score on written in components on [place] (Trouble turning as  walk and Trouble seated to up and walk) to 2/4 or less to indicate reduction in concussion symptoms. Baseline: 3/4 Goal status: INITIAL  ASSESSMENT:  CLINICAL IMPRESSION: PT session focused on continued cervical ROM pain management to help manage cervicogenic component of dizziness as well as work on habituation with progression of VOR and corner balance with HART chart reading. Patient min-mod sway on level and unlevel surfaces but no LOB. Tolerates dim room or use of sunglasses throughout session. Cont with POC.   OBJECTIVE IMPAIRMENTS: Abnormal gait, decreased balance, difficulty walking, dizziness, and pain.   ACTIVITY LIMITATIONS: carrying, lifting, bending, standing, transfers, and locomotion level  PARTICIPATION LIMITATIONS: meal prep, shopping, community activity, and yard work  PERSONAL FACTORS: Past/current experiences, Time since onset of injury/illness/exacerbation, and 3+ comorbidities: see above  are also affecting patient's functional outcome.   REHAB POTENTIAL: Good  CLINICAL DECISION MAKING: Evolving/moderate complexity  EVALUATION COMPLEXITY: Moderate   PLAN:  PT FREQUENCY: 2x/week  PT DURATION: 7 weeks  PLANNED INTERVENTIONS: 97164- PT Re-evaluation, 97110-Therapeutic exercises, 97530- Therapeutic activity, 97112- Neuromuscular re-education, 97535- Self Care, 65784- Manual therapy, [phone]- Gait training, and [phone]- [place] repositioning  PLAN FOR NEXT SESSION: progress balance on compliant surface, continue vestibular exercises, habituation, etc, could trial Caryl Bis or other pickup tasks   assess SOT (in future session),  behavioral health if helpful Carmelia Bake, PT 11/06/2023, 4:06 PM

## 2023-11-07 ENCOUNTER — Ambulatory Visit (INDEPENDENT_AMBULATORY_CARE_PROVIDER_SITE_OTHER): Payer: Medicare Other | Admitting: Ophthalmology

## 2023-11-07 ENCOUNTER — Encounter (INDEPENDENT_AMBULATORY_CARE_PROVIDER_SITE_OTHER): Payer: Self-pay | Admitting: Ophthalmology

## 2023-11-07 DIAGNOSIS — R69 Illness, unspecified: Secondary | ICD-10-CM | POA: Diagnosis not present

## 2023-11-07 DIAGNOSIS — H3581 Retinal edema: Secondary | ICD-10-CM

## 2023-11-07 DIAGNOSIS — D3132 Benign neoplasm of left choroid: Secondary | ICD-10-CM

## 2023-11-07 DIAGNOSIS — Z961 Presence of intraocular lens: Secondary | ICD-10-CM | POA: Diagnosis not present

## 2023-11-08 ENCOUNTER — Other Ambulatory Visit (HOSPITAL_BASED_OUTPATIENT_CLINIC_OR_DEPARTMENT_OTHER): Payer: Self-pay

## 2023-11-08 MED ORDER — COMIRNATY 30 MCG/0.3ML IM SUSY
PREFILLED_SYRINGE | INTRAMUSCULAR | 0 refills | Status: DC
  Filled 2023-11-08: qty 0.3, 1d supply, fill #0

## 2023-11-09 ENCOUNTER — Ambulatory Visit: Payer: Medicare Other | Admitting: Physical Therapy

## 2023-11-09 ENCOUNTER — Encounter: Payer: Self-pay | Admitting: Physical Therapy

## 2023-11-09 DIAGNOSIS — R42 Dizziness and giddiness: Secondary | ICD-10-CM

## 2023-11-09 DIAGNOSIS — R2681 Unsteadiness on feet: Secondary | ICD-10-CM | POA: Diagnosis not present

## 2023-11-09 NOTE — Patient Instructions (Signed)
  Tip Card  1.The goal of habituation training is to assist in decreasing symptoms of vertigo, dizziness, or nausea provoked by specific head and body motions. 2.These exercises may initially increase symptoms; however, be persistent and work through symptoms. With repetition and time, the exercises will assist in reducing or eliminating symptoms. 3.Exercises should be stopped and discussed with the therapist if you experience any of the following: - Sudden change or fluctuation in hearing - New onset of ringing in the ears, or increase in current intensity - Any fluid discharge from the ear - Severe pain in neck or back - Extreme nausea     Bending / Picking Up Objects    Sitting, slowly bend head down and pick up object on the floor. Return to upright position. Hold position until symptoms subside. Repeat __5__ times per session. Do __1-2__ sessions per day.    Sit to Side-Lying    Sit on edge of bed. 1. Turn head 45 to right. 2. Maintain head position and lie down slowly on left side. Hold until symptoms subside. 3. Sit up slowly. Hold until symptoms subside. 4. Turn head 45 to left. 5. Maintain head position and lie down slowly on right side. Hold until symptoms subside. 6. Sit up slowly. Repeat sequence __5__ times per session. Do __1-2__ sessions per day.   PROGRESSED HEP:  X1 viewing exercise "letter exercise" - increase to 60 secs for both horizontal & vertical directions  Progress standing with eyes closed from FLOOR to FOAM - have chair in front for safety

## 2023-11-09 NOTE — Therapy (Signed)
OUTPATIENT PHYSICAL THERAPY VESTIBULAR TREATMENT     Patient Name: Wendy Joyce MRN: [id number] DOB:1946/12/05, 77 y.o., female Today's Date: 11/09/2023  END OF SESSION:  PT End of Session - 11/09/23 1902     Visit Number 5    Number of Visits 10    Date for PT Re-Evaluation 12/12/23    Authorization Type [employer]    PT Start Time 1355    PT Stop Time 1445    PT Time Calculation (min) 50 min    Activity Tolerance Patient tolerated treatment well    Behavior During Therapy [facility] for tasks assessed/performed               Past Medical History:  Diagnosis Date   Abdominal pain, generalized 01/29/2015   Abnormal thyroid function test 02/25/2016   ADHD (attention deficit hyperactivity disorder)    Allergic rhinitis, unspecified 01/29/2015   Allergy    Anemia    DOE   Ankle sprain 03/16/2016   Anxiety 01/29/2015   Arthritis    hands   Chronic kidney disease 2012   kidney stones   Constipation 01/29/2015   Decreased libido 05/02/2018   Disorder of the skin and subcutaneous tissue, unspecified 01/29/2015   DJD (degenerative joint disease)    hand   Dysthymia    Endometriosis    External otitis    Family history of coronary artery disease 05/02/2018   Fatigue due to excessive exertion 02/24/2016   Fever blister    Fibromyalgia 01/29/2015   Frequency of micturition 01/29/2015   Functional dyspepsia 01/29/2015   Gastro-esophageal reflux disease without esophagitis 01/29/2015   Headache(784.0)    High cholesterol    Hyperlipidemia 09/12/2016   Hypersomnia    Hypothyroidism    IBS (irritable bowel syndrome) 05/02/2018   Insomnia    Internal hemorrhoids    Irritable 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 01/29/2015   Labial pain 11/30/2016   Lactose intolerance 12/14/2016   Lactose intolerance in adult 09/12/2016   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   Left ureteral stone    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   Major depressive disorder,  single episode, unspecified    Migraine with aura, not intractable, without status migrainosus    Mixed hyperlipidemia    Mouth pain 01/07/2016   Osteoarthritis of hip    Osteopenia determined by x-ray 03/02/2017   Personal history of estrogen therapy    Phlebitis and thrombophlebitis of the leg    Pityriasis alba 01/29/2015   Pure hypercholesterolemia 06/29/2016   Rectum pain 09/12/2016   Recurrent sinus infections 02/24/2016   Rhinosinusitis 01/07/2016   Rosacea    Sinusitis 12/24/2015   Tinnitus of left ear 01/29/2015   Umbilical hernia without obstruction and without gangrene 01/29/2015   Umbilical pain 05/02/2018   Unspecified urinary incontinence 01/07/2016   Urticaria    UTI (urinary tract infection)    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   Vitamin D deficiency    Past Surgical History:  Procedure Laterality Date   BREAST ENHANCEMENT SURGERY     BREAST REDUCTION SURGERY Bilateral 2018   COLONOSCOPY     COLONOSCOPY WITH PROPOFOL N/A 02/25/2013   Procedure: COLONOSCOPY WITH PROPOFOL;  Surgeon: Charolett Bumpers, MD;  Location: [facility];  Service: Endoscopy;  Laterality: N/A;   left hand surgery  12/28/2022   mini facelift  2000   NASAL SINUS SURGERY     REDUCTION MAMMAPLASTY     ROTATOR CUFF REPAIR  Right   ROTATOR CUFF REPAIR     Left   SEPTOPLASTY WITH ETHMOIDECTOMY, AND MAXILLARY ANTROSTOMY Bilateral 2006   TOTAL ABDOMINAL HYSTERECTOMY     ovaries remain   TUBAL LIGATION     umbicial hernia     Patient Active Problem List   Diagnosis Date Noted   Right lateral abdominal pain 05/17/2023   RUQ abdominal pain 04/12/2023   Depression, recurrent (HCC) 12/25/2022   Preoperative clearance 11/23/2022   Encounter for counseling 10/14/2022   Influenza 09/18/2022   Pain of left hand 06/29/2022   Vasomotor symptoms due to menopause 05/30/2022   Pre-diabetes 05/30/2022   Anxiety disorder 03/17/2022   Allergic rhinitis  03/17/2022   Constipation 03/17/2022   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   Exertional dyspnea 03/17/2022   Family history of coronary artery disease 03/17/2022   Weakness with dizziness 03/17/2022   Fever blister 03/17/2022   Frequency of micturition 03/17/2022   Irritable bowel syndrome 03/17/2022   Lactose intolerance 03/17/2022   Left anterior fascicular block 03/17/2022   Lichen planus 03/17/2022   Panniculitis 03/17/2022   Refractory migraine with aura 03/17/2022   Statin not tolerated 03/17/2022   Thrombophlebitis of superficial veins of lower extremity 03/17/2022   Bilateral tinnitus 03/17/2022   Umbilical hernia 03/17/2022   Urinary incontinence 03/17/2022   Urticaria due to drug allergy 03/17/2022   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 03/17/2022   Sexual dysfunction 03/17/2022   Imbalance 03/17/2022   Fibromyalgia 09/21/2021   Hyperlipidemia 09/21/2021   Hashimoto's thyroiditis 03/31/2021   Hypothyroidism 09/27/2013    PCP: Alyson Reedy, FNP REFERRING PROVIDER: Butch Penny, NP  REFERRING DIAG: 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 (ICD-10-CM) - Post concussive syndrome  THERAPY DIAG:  Dizziness and giddiness  Unsteadiness on feet  ONSET DATE: 10/15/2023 (referral date)   Rationale for Evaluation and Treatment: Rehabilitation  SUBJECTIVE:   SUBJECTIVE STATEMENT:  Patient reports she doesn't feel good today - has a headache, but does not have dizziness at this moment; had some dizziness this morning when she was doing things at home and doing a lot of turning; has ordered Chirp wheel but hasn't used it yet and has also ordered a foam pad for the balance exercises  Pt accompanied by: self  PERTINENT HISTORY: bladder surgery September 2024 for most recent (see below for precautions), prior history of concussion 2021, PTSD (reports triggers can include loud dizzy space), and anxiety in large groups   PAIN:  Are you having pain? Headache at this time - intensity  3-4/10   PRECAUTIONS: Fall and Other: no core work for at least one more week   RED FLAGS: Bowel or bladder incontinence: Yes: bladder incontinence stress and requires use of internal sling to support, managed and consistent with known medical history     WEIGHT BEARING RESTRICTIONS: Yes nothing over 20 lbs for lifting   FALLS: Has patient fallen in last 6 months? Yes. Number of falls 1 fall with concussion and then some close calls about 6 since then  LIVING ENVIRONMENT: Lives with: lives with their spouse Lives in: House/apartment Stairs: Yes - with railing about 15-20 to go up and down  Has following equipment at home: Single point cane  PLOF: Reports being fulling independent, works as an Tree surgeon   PATIENT GOALS: "Patient wants to feel secure when walking in store (not feeling like needs a cart) and would like to get back to dance, yoga, and exercise."   VITALS:  There were no vitals filed for this visit.  OBJECTIVE:  Note: Objective measures were completed at Evaluation unless otherwise noted.  DIAGNOSTIC FINDINGS:   MRI Brain 08/14/2023 IMPRESSION: No evidence of acute abnormality.  COGNITION: Overall cognitive status: Within functional limits for tasks assessed   SENSATION: WFL  POSTURE:  Grossly WFL  Cervical ROM:    Active A/PROM (deg) eval  Flexion WFL  Extension WFL  Right lateral flexion WFL  Left lateral flexion WFL  Right rotation WFL  Left rotation WFL  (Blank rows = not tested)  GAIT: Gait pattern:  hesitancy with initial few steps, cervical guarding and slow on turns Distance walked: short clinic distances < 60 feet Assistive device utilized: None Level of assistance: SBA  PATIENT SURVEYS:   [place] Post Concussion Symptoms Questionnaire:  Compared to before the accident, do you now (last 24 hours) suffer from:  Headaches 1 = no more of a problem  Feeling of Dizziness 3 = moderate problem  Nausea and/or vomiting 2 = mild problem   RPQ-3 (total of first 3 items) 6     Noise sensitivity (easily upset by loud noises) 3 = moderate problem  Sleep disturbances 3 = moderate problem  Fatigue, tiring more easily 4 = severe problem  Being irritable, easily angered: 4 = severe problem  Feeling depressed or tearful 4 = severe problem  Feeling frustrated or impatient 4 = severe problem  Forgetfulness, poor memory 3 = moderate problem  Poor concentration 3 = moderate problem  Taking longer to think 3 = moderate problem  Blurred vision 3 = moderate problem  Light sensitivity (easily upset by bright light) 4 = severe problem  Double vision 1 = no more of a problem  Restlessness 1 = no more of a problem  RPQ-13 (total for next 13 items) 40    Written in components on RPQ: Trouble turning as walk: 3 Trouble seated to up and walk: 3  VESTIBULAR ASSESSMENT:  GENERAL OBSERVATION: reading glasses not donned during session, ambulates without AD, prefers room dimmed during session   SYMPTOM BEHAVIOR:  Subjective history: see above Non-Vestibular symptoms: changes in hearing, diplopia, neck pain, headaches, tinnitus, nausea/vomiting, migraine symptoms, and double vision when watching TV sometimes Type of dizziness: Blurred Vision, Diplopia, Imbalance (Disequilibrium), Unsteady with head/body turns, Lightheadedness/Faint, "Funny feeling in the head", "World moves", and "Swimmyheaded"  Frequency: every day  Duration: short time (seconds and usually when turning)    Aggravating factors: turning   Relieving factors: sitting down  Progression of symptoms: better  Given time constraints of session; unable to assess further   MOTION SENSITIVITY:    Motion Sensitivity Quotient  Intensity: 0 = none, 1 = Lightheaded, 2 = Mild, 3 = Moderate, 4 = Severe, 5 = Vomiting  Intensity  1. Sitting to supine 0  2. Supine to L side 1  3. Supine to R side 0  4. Supine to sitting Felt a little pressure rush when sitting up   5. L Hallpike-Dix  2  6. Up from L  2  7. R Hallpike-Dix 0  8. Up from R  1  9. Sitting, head  tipped to L knee 2, noted ears were ringing louder when going down  10. Head up from L  knee 2  11. Sitting, head  tipped to R knee 2  12. Head up from R  knee 2  13. Sitting head turns x5 2-3  14.Sitting head nods x5 1-2, felt a "sloshing" feeling in head afterwards  15. In stance, 180  turn to  L  3  16. In stance, 180  turn to R 3     Today's Treatment:  11-09-23 Performed in dim treatment room for patient comfort given headache at start of today's session - and also due to pt's report of light and noise sensitivity  NeuroRe-ed:  VOR x 1 viewing progression: X1 viewing - target on plain background, pt in standing position - target 5' away; 60 secs x 1 rep horizontal; cues to decrease ROM and increase speed as tolerated:  60 secs x 1 rep vertical - no significant increase in dizziness reported upon completion  Pt performed Brandt-Daroff exercise 1 rep to each side for habiutation with return to upright position (issued this exercise for HEP) Modified exercise in seated position - leaning forward and returning to upright (instructed pt she did not have to go all way down to floor to retrieve object as picture shows)  Pt performed standing on Airex in corner with EO with feet together  - horizontal head turns 5 reps, vertical head turns 5 reps - moderate unsteadiness; moved feet apart due to unsteadiness - pt performed vertical head turns EO 5 reps; increased balance with feet apart  Progressed to standing with EC on Airex - feet apart - pt held static position 10 secs; added head turns horizontal 5 reps, vertical 5 reps with CGA prn; unsteadiness noted but no total LOB Pt performed standing on Airex - ball toss straight up with eye/head movement for tracking ball; pt made circles CW with ball 5 reps, then CCW 5 reps with ball with visual tracking for improved gaze stabilization  Marching on foam 5 reps  with CGA Stepping from Airex to floor 5 reps each leg - looking straight ahead and not at feet- 5 reps each with CGA  Pt stood in corner on Airex - performed trunk rotation with moving arm and using hand as target - straight across - 5 reps each - slow movement to increase tolerance; pt reported increased tinnitus in Lt ear with this activity  TherAct: 5 cones placed on floor - pt bent over to retrieve each cone with UE support on armrest of chair prn due to unsteadiness - turned 180 degrees to Rt side to place cone in cabinet; pt rated dizziness 5/10 after this activity; pt then performed same activity with retrieving cone, turning 180 degrees to Lt side and placing cone on cabinet shelf - pt had more unsteadiness with turning to Lt than with turning to Rt side; pt rated dizziness 5/10 intensity after each repetition    HEP PROGRESSION/ADDITIONS - 11-09-23 Tip Card  1.The goal of habituation training is to assist in decreasing symptoms of vertigo, dizziness, or nausea provoked by specific head and body motions. 2.These exercises may initially increase symptoms; however, be persistent and work through symptoms. With repetition and time, the exercises will assist in reducing or eliminating symptoms. 3.Exercises should be stopped and discussed with the therapist if you experience any of the following: - Sudden change or fluctuation in hearing - New onset of ringing in the ears, or increase in current intensity - Any fluid discharge from the ear - Severe pain in neck or back - Extreme nausea     Bending / Picking Up Objects    Sitting, slowly bend head down and pick up object on the floor. Return to upright position. Hold position until symptoms subside. Repeat __5__ times per session. Do __1-2__ sessions per day.    Sit to Side-Lying  Sit on edge of bed. 1. Turn head 45 to right. 2. Maintain head position and lie down slowly on left side. Hold until symptoms subside. 3. Sit up  slowly. Hold until symptoms subside. 4. Turn head 45 to left. 5. Maintain head position and lie down slowly on right side. Hold until symptoms subside. 6. Sit up slowly. Repeat sequence __5__ times per session. Do __1-2__ sessions per day.   PROGRESSED HEP:  X1 viewing exercise "letter exercise" - increase to 60 secs for both horizontal & vertical directions  Progress standing with eyes closed from FLOOR to FOAM - have chair in front for safety     PATIENT EDUCATION: Education details: Progressed HEP - see pt instructions Person educated: Patient Education method: Explanation and Verbal cues, demonstration, handouts Education comprehension: verbalized understanding and needs further education  HOME EXERCISE PROGRAM: Access Code: [email or web address] URL: [email or web address] Date: 11/02/2023 Prepared by: Maebelle Munroe  Exercises - Standing Balance in Corner with Eyes Closed  - 1 x daily - 7 x weekly - 1 sets - 1-2 reps - 15 sec hold - Standing balance with EYES OPEN  - 1 x daily - 7 x weekly - 1 sets - 1 reps - 15 hold - Single Leg Stance with Support  - 1 x daily - 7 x weekly - 1 sets - 1-2 reps - 10 sec hold - Standing Marching  - 1 x daily - 7 x weekly - 1 sets - 10 reps    GOALS: Goals reviewed with patient? Yes  SHORT TERM GOALS: Target date: 11/21/2023  Patient will demonstrate independence with initial HEP to continue to progress between physical therapy sessions.   Baseline: To be provided Goal status: INITIAL  2.  Patient will improve RPQ-3 score to </= 4 to indicate reduced severity of post-concussive symptoms to progress towards PLOF.   Baseline: 6 Goal status: INITIAL  3.  Pt will perform items on MSQ as 1-2/5 or less in order to demo improved motion sensitivity.  Baseline:  Goal status: INITIAL  4.  SOT to be assessed and LTG written Baseline: To be assessed  Goal status: INITIAL  LONG TERM GOALS: Target date: 12/12/2023  Patient will report  demonstrate independence with final HEP in order to maintain current gains and continue to progress after physical therapy discharge.   Baseline: To be provided Goal status: INITIAL  2.  Patient will improve RPQ-13 score to </= 3 per item to indicate reduced severity of post-concussive symptoms to progress towards PLOF.   Baseline: 4/5 greatest Goal status: INITIAL  3.  Pt will perform head motions and turning to R/L on MSQ to a 1/5 or less to demo improved dizziness  Baseline: 2-3/5 dizziness  Goal status: INITIAL  4.  SOT to be assessed and LTG written Baseline: To be assessed  Goal status: INITIAL  5.  Patient will reduce score on written in components on [place] (Trouble turning as walk and Trouble seated to up and walk) to 2/4 or less to indicate reduction in concussion symptoms. Baseline: 3/4 Goal status: INITIAL  ASSESSMENT:  CLINICAL IMPRESSION: PT session focused on vestibular exercises, habituation for reduced dizziness with return to upright position and progression of x1 viewing exercise.  Pt had significant unsteadiness with bending over to pick up cone off floor and turning 180 degrees to Lt side to place on cabinet shelf - needed UE support to assist with balance during this activity.  Pt able to tolerate x1 viewing exercise to 60  secs in standing position and able to perform standing on compliant surface with EC in today's session (HEP progressed from standing on floor with EC to compliant surface with EC for HEP).  Pt did report increased tinnitus experienced with vestibular exercises in today's session but tolerated exs. Well overall.  Cont with POC.   OBJECTIVE IMPAIRMENTS: Abnormal gait, decreased balance, difficulty walking, dizziness, and pain.   ACTIVITY LIMITATIONS: carrying, lifting, bending, standing, transfers, and locomotion level  PARTICIPATION LIMITATIONS: meal prep, shopping, community activity, and yard work  PERSONAL FACTORS: Past/current  experiences, Time since onset of injury/illness/exacerbation, and 3+ comorbidities: see above  are also affecting patient's functional outcome.   REHAB POTENTIAL: Good  CLINICAL DECISION MAKING: Evolving/moderate complexity  EVALUATION COMPLEXITY: Moderate   PLAN:  PT FREQUENCY: 2x/week  PT DURATION: 7 weeks  PLANNED INTERVENTIONS: 97164- PT Re-evaluation, 97110-Therapeutic exercises, 97530- Therapeutic activity, 97112- Neuromuscular re-education, 97535- Self Care, 16109- Manual therapy, 97116- Gait training, and [phone]- [place] repositioning  PLAN FOR NEXT SESSION: How did HEP progressions go? progress balance on compliant surface, continue vestibular exercises, habituation, other pickup tasks   assess SOT (in future session),  behavioral health if helpful Nashonda Limberg, Donavan Burnet, PT 11/09/2023, 7:05 PM

## 2023-11-10 DIAGNOSIS — N393 Stress incontinence (female) (male): Secondary | ICD-10-CM | POA: Diagnosis not present

## 2023-11-10 DIAGNOSIS — R3915 Urgency of urination: Secondary | ICD-10-CM | POA: Diagnosis not present

## 2023-11-10 DIAGNOSIS — N952 Postmenopausal atrophic vaginitis: Secondary | ICD-10-CM | POA: Diagnosis not present

## 2023-11-13 ENCOUNTER — Other Ambulatory Visit (HOSPITAL_BASED_OUTPATIENT_CLINIC_OR_DEPARTMENT_OTHER): Payer: Self-pay

## 2023-11-13 ENCOUNTER — Ambulatory Visit: Payer: Medicare Other | Admitting: Physical Therapy

## 2023-11-13 ENCOUNTER — Other Ambulatory Visit (HOSPITAL_BASED_OUTPATIENT_CLINIC_OR_DEPARTMENT_OTHER): Payer: Self-pay | Admitting: Family Medicine

## 2023-11-13 ENCOUNTER — Encounter: Payer: Self-pay | Admitting: Physical Therapy

## 2023-11-13 VITALS — BP 120/67 | HR 53

## 2023-11-13 DIAGNOSIS — R2681 Unsteadiness on feet: Secondary | ICD-10-CM

## 2023-11-13 DIAGNOSIS — R42 Dizziness and giddiness: Secondary | ICD-10-CM

## 2023-11-13 MED ORDER — MONTELUKAST SODIUM 10 MG PO TABS
10.0000 mg | ORAL_TABLET | Freq: Every day | ORAL | 0 refills | Status: DC
  Filled 2024-06-29: qty 90, 90d supply, fill #0

## 2023-11-13 NOTE — Therapy (Unsigned)
OUTPATIENT PHYSICAL THERAPY VESTIBULAR TREATMENT     Patient Name: Wendy Joyce MRN: [id number] DOB:Jun 01, 1946, 77 y.o., female Today's Date: 11/14/2023  END OF SESSION:  PT End of Session - 11/13/23 1539     Visit Number 6    Number of Visits 10    Date for PT Re-Evaluation 12/12/23    Authorization Type [employer]    PT Start Time 1538    PT Stop Time 1614    PT Time Calculation (min) 36 min    Equipment Utilized During Treatment Gait belt    Activity Tolerance Patient tolerated treatment well    Behavior During Therapy WFL for tasks assessed/performed               Past Medical History:  Diagnosis Date   Abdominal pain, generalized 01/29/2015   Abnormal thyroid function test 02/25/2016   ADHD (attention deficit hyperactivity disorder)    Allergic rhinitis, unspecified 01/29/2015   Allergy    Anemia    DOE   Ankle sprain 03/16/2016   Anxiety 01/29/2015   Arthritis    hands   Chronic kidney disease 2012   kidney stones   Constipation 01/29/2015   Decreased libido 05/02/2018   Disorder of the skin and subcutaneous tissue, unspecified 01/29/2015   DJD (degenerative joint disease)    hand   Dysthymia    Endometriosis    External otitis    Family history of coronary artery disease 05/02/2018   Fatigue due to excessive exertion 02/24/2016   Fever blister    Fibromyalgia 01/29/2015   Frequency of micturition 01/29/2015   Functional dyspepsia 01/29/2015   Gastro-esophageal reflux disease without esophagitis 01/29/2015   Headache(784.0)    High cholesterol    Hyperlipidemia 09/12/2016   Hypersomnia    Hypothyroidism    IBS (irritable bowel syndrome) 05/02/2018   Insomnia    Internal hemorrhoids    Irritable 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 01/29/2015   Labial pain 11/30/2016   Lactose intolerance 12/14/2016   Lactose intolerance in adult 09/12/2016   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   Left ureteral stone    LP (lichen  planus) 01/29/2015   Major depressive disorder, single episode, unspecified    Migraine with aura, not intractable, without status migrainosus    Mixed hyperlipidemia    Mouth pain 01/07/2016   Osteoarthritis of hip    Osteopenia determined by x-ray 03/02/2017   Personal history of estrogen therapy    Phlebitis and thrombophlebitis of the leg    Pityriasis alba 01/29/2015   Pure hypercholesterolemia 06/29/2016   Rectum pain 09/12/2016   Recurrent sinus infections 02/24/2016   Rhinosinusitis 01/07/2016   Rosacea    Sinusitis 12/24/2015   Tinnitus of left ear 01/29/2015   Umbilical hernia without obstruction and without gangrene 01/29/2015   Umbilical pain 05/02/2018   Unspecified urinary incontinence 01/07/2016   Urticaria    UTI (urinary tract infection)    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   Vitamin D deficiency    Past Surgical History:  Procedure Laterality Date   BREAST ENHANCEMENT SURGERY     BREAST REDUCTION SURGERY Bilateral 2018   COLONOSCOPY     COLONOSCOPY WITH PROPOFOL N/A 02/25/2013   Procedure: COLONOSCOPY WITH PROPOFOL;  Surgeon: Charolett Bumpers, MD;  Location: [facility];  Service: Endoscopy;  Laterality: N/A;   left hand surgery  12/28/2022   mini facelift  2000   NASAL SINUS SURGERY     REDUCTION  MAMMAPLASTY     ROTATOR CUFF REPAIR     Right   ROTATOR CUFF REPAIR     Left   SEPTOPLASTY WITH ETHMOIDECTOMY, AND MAXILLARY ANTROSTOMY Bilateral 2006   TOTAL ABDOMINAL HYSTERECTOMY     ovaries remain   TUBAL LIGATION     umbicial hernia     Patient Active Problem List   Diagnosis Date Noted   Right lateral abdominal pain 05/17/2023   RUQ abdominal pain 04/12/2023   Depression, recurrent (HCC) 12/25/2022   Preoperative clearance 11/23/2022   Encounter for counseling 10/14/2022   Influenza 09/18/2022   Pain of left hand 06/29/2022   Vasomotor symptoms due to menopause 05/30/2022   Pre-diabetes 05/30/2022    Anxiety disorder 03/17/2022   Allergic rhinitis 03/17/2022   Constipation 03/17/2022   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   Exertional dyspnea 03/17/2022   Family history of coronary artery disease 03/17/2022   Weakness with dizziness 03/17/2022   Fever blister 03/17/2022   Frequency of micturition 03/17/2022   Irritable bowel syndrome 03/17/2022   Lactose intolerance 03/17/2022   Left anterior fascicular block 03/17/2022   Lichen planus 03/17/2022   Panniculitis 03/17/2022   Refractory migraine with aura 03/17/2022   Statin not tolerated 03/17/2022   Thrombophlebitis of superficial veins of lower extremity 03/17/2022   Bilateral tinnitus 03/17/2022   Umbilical hernia 03/17/2022   Urinary incontinence 03/17/2022   Urticaria due to drug allergy 03/17/2022   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 She was doing her exercises on her foam and the ones where she leans over to pick up something from the ground. Patient reports her dizziness felt like it got to 9/10 and she felt nauseous to the point of nearly needing to throw up. She did her exercises the other day and did not feel this bad. Patient arrives a few minutes late to session due to traffic and zoom call that ran over.   Pt accompanied by: self  PERTINENT HISTORY: bladder surgery September 2024 for most recent (see below  for precautions), prior history of concussion 2021, PTSD (reports triggers can include loud dizzy space), and anxiety in large groups   PAIN:  Are you having pain? Headache at this time - intensity 1/10   PRECAUTIONS: Fall and Other: no core work for at least one more week   RED FLAGS: Bowel or bladder incontinence: Yes: bladder incontinence stress and requires use of internal sling to support, managed and consistent with known medical history     WEIGHT BEARING RESTRICTIONS: Yes nothing over 20 lbs for lifting   FALLS: Has patient fallen in last 6 months? Yes. Number of falls 1 fall with concussion and then some close calls about 6 since then  LIVING ENVIRONMENT: Lives with: lives with their spouse Lives in: House/apartment Stairs: Yes - with railing about 15-20 to go up and down  Has following equipment at home: Single point cane  PLOF: Reports being fulling independent, works  as an Tree surgeon   PATIENT GOALS: "Patient wants to feel secure when walking in store (not feeling like needs a cart) and would like to get back to dance, yoga, and exercise."   VITALS:  Vitals:   11/13/23 1546  BP: 120/67  Pulse: (!) 53     OBJECTIVE:  Note: Objective measures were completed at Evaluation unless otherwise noted.  DIAGNOSTIC FINDINGS:   MRI Brain 08/14/2023 IMPRESSION: No evidence of acute abnormality.  COGNITION: Overall cognitive status: Within functional limits for tasks assessed   SENSATION: WFL  POSTURE:  Grossly WFL  Cervical ROM:    Active A/PROM (deg) eval  Flexion WFL  Extension WFL  Right lateral flexion WFL  Left lateral flexion WFL  Right rotation WFL  Left rotation WFL  (Blank rows = not tested)  GAIT: Gait pattern:  hesitancy with initial few steps, cervical guarding and slow on turns Distance walked: short clinic distances < 60 feet Assistive device utilized: None Level of assistance: SBA  PATIENT SURVEYS:   [place] Post Concussion Symptoms  Questionnaire:  Compared to before the accident, do you now (last 24 hours) suffer from:  Headaches 1 = no more of a problem  Feeling of Dizziness 3 = moderate problem  Nausea and/or vomiting 2 = mild problem  RPQ-3 (total of first 3 items) 6     Noise sensitivity (easily upset by loud noises) 3 = moderate problem  Sleep disturbances 3 = moderate problem  Fatigue, tiring more easily 4 = severe problem  Being irritable, easily angered: 4 = severe problem  Feeling depressed or tearful 4 = severe problem  Feeling frustrated or impatient 4 = severe problem  Forgetfulness, poor memory 3 = moderate problem  Poor concentration 3 = moderate problem  Taking longer to think 3 = moderate problem  Blurred vision 3 = moderate problem  Light sensitivity (easily upset by bright light) 4 = severe problem  Double vision 1 = no more of a problem  Restlessness 1 = no more of a problem  RPQ-13 (total for next 13 items) 40    Written in components on RPQ: Trouble turning as walk: 3 Trouble seated to up and walk: 3  VESTIBULAR ASSESSMENT:  GENERAL OBSERVATION: reading glasses not donned during session, ambulates without AD, prefers room dimmed during session   SYMPTOM BEHAVIOR:  Subjective history: see above Non-Vestibular symptoms: changes in hearing, diplopia, neck pain, headaches, tinnitus, nausea/vomiting, migraine symptoms, and double vision when watching TV sometimes Type of dizziness: Blurred Vision, Diplopia, Imbalance (Disequilibrium), Unsteady with head/body turns, Lightheadedness/Faint, "Funny feeling in the head", "World moves", and "Swimmyheaded"  Frequency: every day  Duration: short time (seconds and usually when turning)    Aggravating factors: turning   Relieving factors: sitting down  Progression of symptoms: better  Given time constraints of session; unable to assess further   MOTION SENSITIVITY:    Motion Sensitivity Quotient  Intensity: 0 = none, 1 = Lightheaded, 2 =  Mild, 3 = Moderate, 4 = Severe, 5 = Vomiting  Intensity  1. Sitting to supine 0  2. Supine to L side 1  3. Supine to R side 0  4. Supine to sitting Felt a little pressure rush when sitting up   5. L Hallpike[place]Dix 2  6. Up from L  2  7. R Hallpike-Dix 0  8. Up from R  1  9. Sitting, head  tipped to L knee 2, noted ears were ringing louder when going down  10. Head  up from L  knee 2  11. Sitting, head  tipped to R knee 2  12. Head up from R  knee 2  13. Sitting head turns x5 2-3  14.Sitting head nods x5 1-2, felt a "sloshing" feeling in head afterwards  15. In stance, 180  turn to L  3  16. In stance, 180  turn to R 3    Today's Treatment:  Performed in treatment room with only one light turned off for minor increase in challenge.   Vitals:   11/13/23 1546  BP: 120/67  Pulse: (!) 53  Seated on L arm; briefly discussed follow up with PCP about lower HR  NeuroRe-ed:  *Session performed primarily out in gym with glasses doffed during exercises and donned as break  SciFit level 4 x 6 min for neural priming and to assess HR response with Find it game for visual tracking and saccades (HR increases to 60s)  VOR x 1 viewing progression (glasses doffed during sets): X1 viewing - target on busy background, pt in standing position - target 5' away; 60 secs x 2 rep horizontal; no major increase in dizziness, reports major increase on second set:  60 secs x 2 rep vertical - reports increase in dizziness by about 1 point   Dynamic overground gait with visual/vestibular challenges 1 x 20 feet of each (CGA) Clockwise visual tracking ball  Counterclockwise visual tracking ball Ball toss to self with visual  Discussed at end of session symptom management with HEP and how to adjust speed of VOR without causing 9/10 dizziness  PATIENT EDUCATION: Education details: Continue HEP + modifications as noted above Person educated: Patient Education method: Explanation and Verbal cues,  demonstration, handouts Education comprehension: verbalized understanding and needs further education  HOME EXERCISE PROGRAM: Access Code: [email or web address] URL: [email or web address] Date: 11/02/2023 Prepared by: Maebelle Munroe  Exercises - Standing Balance in Corner with Eyes Closed  - 1 x daily - 7 x weekly - 1 sets - 1-2 reps - 15 sec hold - Standing balance with EYES OPEN  - 1 x daily - 7 x weekly - 1 sets - 1 reps - 15 hold - Single Leg Stance with Support  - 1 x daily - 7 x weekly - 1 sets - 1-2 reps - 10 sec hold - Standing Marching  - 1 x daily - 7 x weekly - 1 sets - 10 reps  HEP PROGRESSION/ADDITIONS - 11-09-23 Tip Card  1.The goal of habituation training is to assist in decreasing symptoms of vertigo, dizziness, or nausea provoked by specific head and body motions. 2.These exercises may initially increase symptoms; however, be persistent and work through symptoms. With repetition and time, the exercises will assist in reducing or eliminating symptoms. 3.Exercises should be stopped and discussed with the therapist if you experience any of the following: - Sudden change or fluctuation in hearing - New onset of ringing in the ears, or increase in current intensity - Any fluid discharge from the ear - Severe pain in neck or back - Extreme nausea  Bending / Picking Up Objects    Sitting, slowly bend head down and pick up object on the floor. Return to upright position. Hold position until symptoms subside. Repeat __5__ times per session. Do __1-2__ sessions per day.    Sit to Side-Lying    Sit on edge of bed. 1. Turn head 45 to right. 2. Maintain head position and lie down slowly on left side. Hold until symptoms subside. 3. Sit  up slowly. Hold until symptoms subside. 4. Turn head 45 to left. 5. Maintain head position and lie down slowly on right side. Hold until symptoms subside. 6. Sit up slowly. Repeat sequence __5__ times per session. Do __1-2__ sessions per  day.   PROGRESSED HEP:  X1 viewing exercise "letter exercise" - increase to 60 secs for both horizontal & vertical directions  Progress standing with eyes closed from FLOOR to FOAM - have chair in front for safety       GOALS: Goals reviewed with patient? Yes  SHORT TERM GOALS: Target date: 11/21/2023  Patient will demonstrate independence with initial HEP to continue to progress between physical therapy sessions.   Baseline: To be provided Goal status: INITIAL  2.  Patient will improve RPQ-3 score to </= 4 to indicate reduced severity of post-concussive symptoms to progress towards PLOF.   Baseline: 6 Goal status: INITIAL  3.  Pt will perform items on MSQ as 1-2/5 or less in order to demo improved motion sensitivity.  Baseline:  Goal status: INITIAL  4.  SOT to be assessed and LTG written Baseline: To be assessed  Goal status: INITIAL  LONG TERM GOALS: Target date: 12/12/2023  Patient will report demonstrate independence with final HEP in order to maintain current gains and continue to progress after physical therapy discharge.   Baseline: To be provided Goal status: INITIAL  2.  Patient will improve RPQ-13 score to </= 3 per item to indicate reduced severity of post-concussive symptoms to progress towards PLOF.   Baseline: 4/5 greatest Goal status: INITIAL  3.  Pt will perform head motions and turning to R/L on MSQ to a 1/5 or less to demo improved dizziness  Baseline: 2-3/5 dizziness  Goal status: INITIAL  4.  SOT to be assessed and LTG written Baseline: To be assessed  Goal status: INITIAL  5.  Patient will reduce score on written in components on [place] (Trouble turning as walk and Trouble seated to up and walk) to 2/4 or less to indicate reduction in concussion symptoms. Baseline: 3/4 Goal status: INITIAL  ASSESSMENT:  CLINICAL IMPRESSION: PT session focused on vestibular exercises, habituation and oculomotor tasks.Patient able to tolerate part of  session out in gym with glasses doffed and then donned for break. Discussed modification of HEP for tolerance. Increased challenge noted with VOR x 1 viewing on busy background particularly with second set; discussed pacing to improve tolerance. Cont with POC.   OBJECTIVE IMPAIRMENTS: Abnormal gait, decreased balance, difficulty walking, dizziness, and pain.   ACTIVITY LIMITATIONS: carrying, lifting, bending, standing, transfers, and locomotion level  PARTICIPATION LIMITATIONS: meal prep, shopping, community activity, and yard work  PERSONAL FACTORS: Past/current experiences, Time since onset of injury/illness/exacerbation, and 3+ comorbidities: see above  are also affecting patient's functional outcome.   REHAB POTENTIAL: Good  CLINICAL DECISION MAKING: Evolving/moderate complexity  EVALUATION COMPLEXITY: Moderate   PLAN:  PT FREQUENCY: 2x/week  PT DURATION: 7 weeks  PLANNED INTERVENTIONS: 97164- PT Re-evaluation, 97110-Therapeutic exercises, 97530- Therapeutic activity, 97112- Neuromuscular re-education, 97535- Self Care, 11914- Manual therapy, 97116- Gait training, and [phone]- [place] repositioning  PLAN FOR NEXT SESSION: How did HEP progressions go? progress balance on compliant surface, continue vestibular exercises, habituation, other pickup tasks   assess SOT (in future session),  behavioral health if helpful Carmelia Bake, PT, DPT 11/14/2023, 8:28 AM

## 2023-11-14 ENCOUNTER — Encounter (HOSPITAL_BASED_OUTPATIENT_CLINIC_OR_DEPARTMENT_OTHER): Payer: Self-pay | Admitting: Family Medicine

## 2023-11-14 ENCOUNTER — Ambulatory Visit (INDEPENDENT_AMBULATORY_CARE_PROVIDER_SITE_OTHER): Payer: Medicare Other | Admitting: Family Medicine

## 2023-11-14 ENCOUNTER — Encounter: Payer: Self-pay | Admitting: Physical Therapy

## 2023-11-14 VITALS — BP 115/68 | HR 57 | Ht 63.6 in | Wt 144.4 lb

## 2023-11-14 DIAGNOSIS — R42 Dizziness and giddiness: Secondary | ICD-10-CM

## 2023-11-14 DIAGNOSIS — R531 Weakness: Secondary | ICD-10-CM | POA: Diagnosis not present

## 2023-11-14 DIAGNOSIS — R001 Bradycardia, unspecified: Secondary | ICD-10-CM

## 2023-11-14 DIAGNOSIS — F5101 Primary insomnia: Secondary | ICD-10-CM | POA: Diagnosis not present

## 2023-11-14 NOTE — Progress Notes (Unsigned)
   Acute Office Visit  Subjective:     Patient ID: Wendy Joyce, female    DOB: 08-04-1946, 77 y.o.   MRN: [id number]  @PATIENTNAME @ presents today for concerns for ***.   Chief Complaint  Patient presents with   Bradycardia    Has had increased dizziness, body feels heavy but she feels lightheaded. Has felt like this since incident in July, has worsened with rehab   Reports dizziness, balance issues, headaches, diplopia  She has been in PT- has been doing exercises but reports she is still having dizziness.  She reports she had to use a cane in [place] due to dizziness. Yesterday she was nauseous & dizziness.  Denies chest pain Shortness of breath with exertion  Felt heavy, like her body is heavy  Denies weakness  Low energy   Home BP readings: have not checked in a while, reports no low readings      [place]- used to take 10mg  but reports she is  Does not take it every night    HR 50-59    ROS     Objective:    BP 115/68   Pulse (!) 57   Ht 5' 3.6" (1.615 m)   Wt 144 lb 6.4 oz (65.5 kg)   SpO2 100%   BMI 25.10 kg/m   Physical Exam    Assessment & Plan:   ***  No follow-ups on file.  Alyson Reedy, FNP

## 2023-11-15 ENCOUNTER — Ambulatory Visit: Payer: Medicare Other | Admitting: Physical Therapy

## 2023-11-15 ENCOUNTER — Encounter: Payer: Self-pay | Admitting: Physical Therapy

## 2023-11-15 DIAGNOSIS — R42 Dizziness and giddiness: Secondary | ICD-10-CM | POA: Diagnosis not present

## 2023-11-15 DIAGNOSIS — R2681 Unsteadiness on feet: Secondary | ICD-10-CM | POA: Diagnosis not present

## 2023-11-15 DIAGNOSIS — R001 Bradycardia, unspecified: Secondary | ICD-10-CM | POA: Insufficient documentation

## 2023-11-15 DIAGNOSIS — F5101 Primary insomnia: Secondary | ICD-10-CM | POA: Insufficient documentation

## 2023-11-15 DIAGNOSIS — R531 Weakness: Secondary | ICD-10-CM | POA: Diagnosis not present

## 2023-11-15 NOTE — Therapy (Signed)
OUTPATIENT PHYSICAL THERAPY VESTIBULAR TREATMENT     Patient Name: Wendy Joyce MRN: [id number] DOB:31-Jul-1946, 77 y.o., female Today's Date: 11/15/2023  END OF SESSION:  PT End of Session - 11/15/23 1535     Visit Number 7    Number of Visits 10    Date for PT Re-Evaluation 12/12/23    Authorization Type [employer]    PT Start Time [phone]    PT Stop Time [phone]    PT Time Calculation (min) 40 min    Equipment Utilized During Treatment Gait belt    Activity Tolerance Patient tolerated treatment well    Behavior During Therapy [facility] for tasks assessed/performed               Past Medical History:  Diagnosis Date   Abdominal pain, generalized 01/29/2015   Abnormal thyroid function test 02/25/2016   ADHD (attention deficit hyperactivity disorder)    Allergic rhinitis, unspecified 01/29/2015   Allergy    Anemia    DOE   Ankle sprain 03/16/2016   Anxiety 01/29/2015   Arthritis    hands   Chronic kidney disease 2012   kidney stones   Constipation 01/29/2015   Decreased libido 05/02/2018   Disorder of the skin and subcutaneous tissue, unspecified 01/29/2015   DJD (degenerative joint disease)    hand   Dysthymia    Endometriosis    External otitis    Family history of coronary artery disease 05/02/2018   Fatigue due to excessive exertion 02/24/2016   Fever blister    Fibromyalgia 01/29/2015   Frequency of micturition 01/29/2015   Functional dyspepsia 01/29/2015   Gastro-esophageal reflux disease without esophagitis 01/29/2015   Headache(784.0)    High cholesterol    Hyperlipidemia 09/12/2016   Hypersomnia    Hypothyroidism    IBS (irritable bowel syndrome) 05/02/2018   Insomnia    Internal hemorrhoids    Irritable 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 01/29/2015   Labial pain 11/30/2016   Lactose intolerance 12/14/2016   Lactose intolerance in adult 09/12/2016   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   Left ureteral stone    LP (lichen  planus) 01/29/2015   Major depressive disorder, single episode, unspecified    Migraine with aura, not intractable, without status migrainosus    Mixed hyperlipidemia    Mouth pain 01/07/2016   Osteoarthritis of hip    Osteopenia determined by x-ray 03/02/2017   Personal history of estrogen therapy    Phlebitis and thrombophlebitis of the leg    Pityriasis alba 01/29/2015   Pure hypercholesterolemia 06/29/2016   Rectum pain 09/12/2016   Recurrent sinus infections 02/24/2016   Rhinosinusitis 01/07/2016   Rosacea    Sinusitis 12/24/2015   Tinnitus of left ear 01/29/2015   Umbilical hernia without obstruction and without gangrene 01/29/2015   Umbilical pain 05/02/2018   Unspecified urinary incontinence 01/07/2016   Urticaria    UTI (urinary tract infection)    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   Vitamin D deficiency    Past Surgical History:  Procedure Laterality Date   BREAST ENHANCEMENT SURGERY     BREAST REDUCTION SURGERY Bilateral 2018   COLONOSCOPY     COLONOSCOPY WITH PROPOFOL N/A 02/25/2013   Procedure: COLONOSCOPY WITH PROPOFOL;  Surgeon: Charolett Bumpers, MD;  Location: [facility];  Service: Endoscopy;  Laterality: N/A;   left hand surgery  12/28/2022   mini facelift  2000   NASAL SINUS SURGERY     REDUCTION  MAMMAPLASTY     ROTATOR CUFF REPAIR     Right   ROTATOR CUFF REPAIR     Left   SEPTOPLASTY WITH ETHMOIDECTOMY, AND MAXILLARY ANTROSTOMY Bilateral 2006   TOTAL ABDOMINAL HYSTERECTOMY     ovaries remain   TUBAL LIGATION     umbicial hernia     Patient Active Problem List   Diagnosis Date Noted   Bradycardia, sinus 11/15/2023   Primary insomnia 11/15/2023   Right lateral abdominal pain 05/17/2023   RUQ abdominal pain 04/12/2023   Depression, recurrent (HCC) 12/25/2022   Preoperative clearance 11/23/2022   Encounter for counseling 10/14/2022   Influenza 09/18/2022   Pain of left hand 06/29/2022   Vasomotor  symptoms due to menopause 05/30/2022   Pre-diabetes 05/30/2022   Anxiety disorder 03/17/2022   Allergic rhinitis 03/17/2022   Constipation 03/17/2022   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   Exertional dyspnea 03/17/2022   Family history of coronary artery disease 03/17/2022   Weakness with dizziness 03/17/2022   Fever blister 03/17/2022   Frequency of micturition 03/17/2022   Irritable bowel syndrome 03/17/2022   Lactose intolerance 03/17/2022   Left anterior fascicular block 03/17/2022   Lichen planus 03/17/2022   Panniculitis 03/17/2022   Refractory migraine with aura 03/17/2022   Statin not tolerated 03/17/2022   Thrombophlebitis of superficial veins of lower extremity 03/17/2022   Bilateral tinnitus 03/17/2022   Umbilical hernia 03/17/2022   Urinary incontinence 03/17/2022   Urticaria due to drug allergy 03/17/2022   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 EKG obtained which was normal. Labs also done. If no clear indication for cause of bradycardia, 14-day heart monitor and possible echo will be ordered. Since Tuesday when doing the letter exercises are feeling more nauseous and dizzy. Had to slow it down and do less time. Also felt more dizzy with the eyes closed on the pad. Reports the last few days have been worse. Had to have more help walking and trying to keep up with her husband. Had  to hold onto him. Still feeling dizzy when turning or bending.   Pt accompanied by: self  PERTINENT HISTORY: bladder surgery September 2024 for most recent (see below for precautions), prior history of concussion 2021, PTSD (reports triggers can include loud dizzy space), and anxiety in large groups   PAIN:  Are you having pain? Dull headache, 1/10    PRECAUTIONS: Fall and Other: no core work for at least one more week   RED FLAGS: Bowel or bladder incontinence: Yes: bladder incontinence stress and requires use of internal sling to support, managed and consistent with known medical history     WEIGHT BEARING RESTRICTIONS: Yes nothing over 20 lbs for lifting   FALLS: Has patient fallen in last 6 months? Yes. Number of falls 1 fall with concussion and then some close calls about 6 since then  LIVING ENVIRONMENT: Lives with: lives with their spouse Lives in: House/apartment Stairs: Yes - with railing about 15-20 to go up  and down  Has following equipment at home: Single point cane  PLOF: Reports being fulling independent, works as an Tree surgeon   PATIENT GOALS: "Patient wants to feel secure when walking in store (not feeling like needs a cart) and would like to get back to dance, yoga, and exercise."   VITALS:  There were no vitals filed for this visit.    OBJECTIVE:  Note: Objective measures were completed at Evaluation unless otherwise noted.  DIAGNOSTIC FINDINGS:   MRI Brain 08/14/2023 IMPRESSION: No evidence of acute abnormality.  COGNITION: Overall cognitive status: Within functional limits for tasks assessed   SENSATION: WFL  POSTURE:  Grossly WFL  Cervical ROM:    Active A/PROM (deg) eval  Flexion WFL  Extension WFL  Right lateral flexion WFL  Left lateral flexion WFL  Right rotation WFL  Left rotation WFL  (Blank rows = not tested)  GAIT: Gait pattern:  hesitancy with initial few steps, cervical guarding and slow on turns Distance walked: short clinic  distances < 60 feet Assistive device utilized: None Level of assistance: SBA  PATIENT SURVEYS:   [place] Post Concussion Symptoms Questionnaire:  Compared to before the accident, do you now (last 24 hours) suffer from:  Headaches 1 = no more of a problem  Feeling of Dizziness 3 = moderate problem  Nausea and/or vomiting 2 = mild problem  RPQ-3 (total of first 3 items) 6     Noise sensitivity (easily upset by loud noises) 3 = moderate problem  Sleep disturbances 3 = moderate problem  Fatigue, tiring more easily 4 = severe problem  Being irritable, easily angered: 4 = severe problem  Feeling depressed or tearful 4 = severe problem  Feeling frustrated or impatient 4 = severe problem  Forgetfulness, poor memory 3 = moderate problem  Poor concentration 3 = moderate problem  Taking longer to think 3 = moderate problem  Blurred vision 3 = moderate problem  Light sensitivity (easily upset by bright light) 4 = severe problem  Double vision 1 = no more of a problem  Restlessness 1 = no more of a problem  RPQ-13 (total for next 13 items) 40    Written in components on RPQ: Trouble turning as walk: 3 Trouble seated to up and walk: 3  VESTIBULAR ASSESSMENT:  GENERAL OBSERVATION: reading glasses not donned during session, ambulates without AD, prefers room dimmed during session   SYMPTOM BEHAVIOR:  Subjective history: see above Non-Vestibular symptoms: changes in hearing, diplopia, neck pain, headaches, tinnitus, nausea/vomiting, migraine symptoms, and double vision when watching TV sometimes Type of dizziness: Blurred Vision, Diplopia, Imbalance (Disequilibrium), Unsteady with head/body turns, Lightheadedness/Faint, "Funny feeling in the head", "World moves", and "Swimmyheaded"  Frequency: every day  Duration: short time (seconds and usually when turning)    Aggravating factors: turning   Relieving factors: sitting down  Progression of symptoms: better  Given time constraints of  session; unable to assess further   MOTION SENSITIVITY:    Motion Sensitivity Quotient  Intensity: 0 = none, 1 = Lightheaded, 2 = Mild, 3 = Moderate, 4 = Severe, 5 = Vomiting  Intensity  1. Sitting to supine 0  2. Supine to L side 1  3. Supine to R side 0  4. Supine to sitting Felt a little pressure rush when sitting up   5. L Hallpike-Dix 2  6. Up from L  2  7. R Hallpike-Dix 0  8. Up from R  1  9. Sitting, head  tipped to L  knee 2, noted ears were ringing louder when going down  10. Head up from L  knee 2  11. Sitting, head  tipped to R knee 2  12. Head up from R  knee 2  13. Sitting head turns x5 2-3  14.Sitting head nods x5 1-2, felt a "sloshing" feeling in head afterwards  15. In stance, 180  turn to L  3  16. In stance, 180  turn to R 3      Today's Treatment:  Performed in therapy gym with pt wearing sunglasses for symptom management   NMR:  Habituation to bending in sitting Picking up 3 cones from R and 3 cones from L, alternating between each side, performed 2 sets, pt reporting symptoms as 3-4/10  Standing bending in front of mirror with reaching to targets on mirror (not bending all the way, pt just performing a mini squat), 10 reps, pt reporting more of her unsteadiness with bending is when she reaches more forwards. Discussed positioning when pt is standing doing tasks like reaching, trying to be mindful about widening BOS to help with balance  Habituation to turning/reaching: Turning 90 degrees to R/L and reaching outside of BOS to hand ball to therapist, performed approx 12 reps, with pt having unsteadiness and mild dizziness with this, needing a second between turning to regain balance and wait for dizziness to subside  On blue foam beam:  Alternating step forwards and back on 15 reps each side, pt challenged with balance when having to step back on, initially having to use UE support to steady herself  Alternating step backwards and back on, 5 reps each  side, pt reporting much easier this direction instead of stepping forwards  Side stepping and then feet together down and back x3 reps, pt's balance improved with incr reps  Sit <> stands with EC on level ground 5 reps, on air ex 2 x 5 reps, improved balance with 2nd set   PATIENT EDUCATION: Education details: Symptom management with exercises, discussed performing standing VOR x1 with a plain background at this time as a patterned background is currently making pt too symptomatic Person educated: Patient Education method: Explanation and Verbal cues,  Education comprehension: verbalized understanding and needs further education  HOME EXERCISE PROGRAM: Access Code: [email or web address] URL: [email or web address] Date: 11/02/2023 Prepared by: Maebelle Munroe  Exercises - Standing Balance in Corner with Eyes Closed  - 1 x daily - 7 x weekly - 1 sets - 1-2 reps - 15 sec hold - Standing balance with EYES OPEN  - 1 x daily - 7 x weekly - 1 sets - 1 reps - 15 hold - Single Leg Stance with Support  - 1 x daily - 7 x weekly - 1 sets - 1-2 reps - 10 sec hold - Standing Marching  - 1 x daily - 7 x weekly - 1 sets - 10 reps  HEP PROGRESSION/ADDITIONS - 11-09-23 Tip Card  1.The goal of habituation training is to assist in decreasing symptoms of vertigo, dizziness, or nausea provoked by specific head and body motions. 2.These exercises may initially increase symptoms; however, be persistent and work through symptoms. With repetition and time, the exercises will assist in reducing or eliminating symptoms. 3.Exercises should be stopped and discussed with the therapist if you experience any of the following: - Sudden change or fluctuation in hearing - New onset of ringing in the ears, or increase in current intensity - Any fluid discharge from the ear - Severe pain in  neck or back - Extreme nausea  Bending / Picking Up Objects    Sitting, slowly bend head down and pick up object on the floor. Return  to upright position. Hold position until symptoms subside. Repeat __5__ times per session. Do __1-2__ sessions per day.    Sit to Side-Lying    Sit on edge of bed. 1. Turn head 45 to right. 2. Maintain head position and lie down slowly on left side. Hold until symptoms subside. 3. Sit up slowly. Hold until symptoms subside. 4. Turn head 45 to left. 5. Maintain head position and lie down slowly on right side. Hold until symptoms subside. 6. Sit up slowly. Repeat sequence __5__ times per session. Do __1-2__ sessions per day.   PROGRESSED HEP:  X1 viewing exercise "letter exercise" - increase to 60 secs for both horizontal & vertical directions  Progress standing with eyes closed from FLOOR to FOAM - have chair in front for safety       GOALS: Goals reviewed with patient? Yes  SHORT TERM GOALS: Target date: 11/21/2023  Patient will demonstrate independence with initial HEP to continue to progress between physical therapy sessions.   Baseline: To be provided Goal status: INITIAL  2.  Patient will improve RPQ-3 score to </= 4 to indicate reduced severity of post-concussive symptoms to progress towards PLOF.   Baseline: 6 Goal status: INITIAL  3.  Pt will perform items on MSQ as 1-2/5 or less in order to demo improved motion sensitivity.  Baseline:  Goal status: INITIAL  4.  SOT to be assessed and LTG written Baseline: To be assessed  Goal status: INITIAL  LONG TERM GOALS: Target date: 12/12/2023  Patient will report demonstrate independence with final HEP in order to maintain current gains and continue to progress after physical therapy discharge.   Baseline: To be provided Goal status: INITIAL  2.  Patient will improve RPQ-13 score to </= 3 per item to indicate reduced severity of post-concussive symptoms to progress towards PLOF.   Baseline: 4/5 greatest Goal status: INITIAL  3.  Pt will perform head motions and turning to R/L on MSQ to a 1/5 or less to demo  improved dizziness  Baseline: 2-3/5 dizziness  Goal status: INITIAL  4.  SOT to be assessed and LTG written Baseline: To be assessed  Goal status: INITIAL  5.  Patient will reduce score on written in components on [place] (Trouble turning as walk and Trouble seated to up and walk) to 2/4 or less to indicate reduction in concussion symptoms. Baseline: 3/4 Goal status: INITIAL  ASSESSMENT:  CLINICAL IMPRESSION: Pt reports having a lot of nausea/dizziness from previous session with VOR x1 with busy background, discussed going back to a plain background at this time. Today's session focused on habituation to bending/turning/reaching tasks, and balance strategies on compliant surfaces. Pt dizzy and unsteady with turning exercises, will continue to address in session. With balance exercises on foam, pt demonstrating improved balance and ankle strategy with incr practice. Will continue per POC.    OBJECTIVE IMPAIRMENTS: Abnormal gait, decreased balance, difficulty walking, dizziness, and pain.   ACTIVITY LIMITATIONS: carrying, lifting, bending, standing, transfers, and locomotion level  PARTICIPATION LIMITATIONS: meal prep, shopping, community activity, and yard work  PERSONAL FACTORS: Past/current experiences, Time since onset of injury/illness/exacerbation, and 3+ comorbidities: see above  are also affecting patient's functional outcome.   REHAB POTENTIAL: Good  CLINICAL DECISION MAKING: Evolving/moderate complexity  EVALUATION COMPLEXITY: Moderate   PLAN:  PT FREQUENCY: 2x/week  PT DURATION: 7 weeks  PLANNED INTERVENTIONS: 97164- PT Re-evaluation, 97110-Therapeutic exercises, 97530- Therapeutic activity, 97112- Neuromuscular re-education, 97535- Self Care, 13086- Manual therapy, [phone]- Gait training, and [phone]- [place] repositioning  PLAN FOR NEXT SESSION:  progress balance on compliant surface, continue vestibular exercises, habituation, other pickup tasks  CHECK GOALS NEXT  WEEK, work on turning and reaching tasks    For SOT goal - I would re-check mCTSIB  Behavioral health if helpful  Drake Leach, PT, DPT 11/15/2023, 4:29 PM

## 2023-11-16 ENCOUNTER — Other Ambulatory Visit: Payer: Self-pay

## 2023-11-16 ENCOUNTER — Telehealth (HOSPITAL_BASED_OUTPATIENT_CLINIC_OR_DEPARTMENT_OTHER): Payer: Self-pay | Admitting: Family Medicine

## 2023-11-16 ENCOUNTER — Encounter (HOSPITAL_BASED_OUTPATIENT_CLINIC_OR_DEPARTMENT_OTHER): Payer: Self-pay | Admitting: Family Medicine

## 2023-11-16 ENCOUNTER — Other Ambulatory Visit (HOSPITAL_BASED_OUTPATIENT_CLINIC_OR_DEPARTMENT_OTHER): Payer: Self-pay | Admitting: Family Medicine

## 2023-11-16 ENCOUNTER — Other Ambulatory Visit (HOSPITAL_BASED_OUTPATIENT_CLINIC_OR_DEPARTMENT_OTHER): Payer: Self-pay

## 2023-11-16 ENCOUNTER — Encounter (HOSPITAL_BASED_OUTPATIENT_CLINIC_OR_DEPARTMENT_OTHER): Payer: Self-pay | Admitting: Emergency Medicine

## 2023-11-16 ENCOUNTER — Encounter: Payer: Self-pay | Admitting: Medical-Surgical

## 2023-11-16 ENCOUNTER — Emergency Department (HOSPITAL_BASED_OUTPATIENT_CLINIC_OR_DEPARTMENT_OTHER)
Admission: EM | Admit: 2023-11-16 | Discharge: 2023-11-16 | Disposition: A | Discharge status: Home / Self Care | Payer: Medicare Other | Attending: Emergency Medicine | Admitting: Emergency Medicine

## 2023-11-16 DIAGNOSIS — R001 Bradycardia, unspecified: Secondary | ICD-10-CM | POA: Insufficient documentation

## 2023-11-16 DIAGNOSIS — R899 Unspecified abnormal finding in specimens from other organs, systems and tissues: Secondary | ICD-10-CM

## 2023-11-16 DIAGNOSIS — E039 Hypothyroidism, unspecified: Secondary | ICD-10-CM | POA: Diagnosis not present

## 2023-11-16 DIAGNOSIS — R42 Dizziness and giddiness: Secondary | ICD-10-CM | POA: Insufficient documentation

## 2023-11-16 DIAGNOSIS — R799 Abnormal finding of blood chemistry, unspecified: Secondary | ICD-10-CM | POA: Diagnosis not present

## 2023-11-16 DIAGNOSIS — E875 Hyperkalemia: Secondary | ICD-10-CM | POA: Insufficient documentation

## 2023-11-16 DIAGNOSIS — F5101 Primary insomnia: Secondary | ICD-10-CM

## 2023-11-16 DIAGNOSIS — E063 Autoimmune thyroiditis: Secondary | ICD-10-CM

## 2023-11-16 LAB — CBC WITH DIFFERENTIAL/PLATELET
Abs Immature Granulocytes: 0.02 10*3/uL (ref 0.00–0.07)
Basophils Absolute: 0.1 10*3/uL (ref 0.0–0.2)
Basophils Absolute: 0.1 10*3/uL (ref 0.0–0.1)
Basophils Relative: 2 %
Basos: 1 %
EOS (ABSOLUTE): 0.2 10*3/uL (ref 0.0–0.4)
Eos: 3 %
Eosinophils Absolute: 0.2 10*3/uL (ref 0.0–0.5)
Eosinophils Relative: 3 %
HCT: 37.9 % (ref 36.0–46.0)
Hematocrit: 38.9 % (ref 34.0–46.6)
Hemoglobin: 12.5 g/dL (ref 11.1–15.9)
Hemoglobin: 13 g/dL (ref 12.0–15.0)
Immature Grans (Abs): 0.1 10*3/uL (ref 0.0–0.1)
Immature Granulocytes: 1 %
Immature Granulocytes: 0 %
Lymphocytes Absolute: 2.8 10*3/uL (ref 0.7–3.1)
Lymphocytes Relative: 30 %
Lymphs Abs: 1.4 10*3/uL (ref 0.7–4.0)
Lymphs: 40 %
MCH: 30.7 pg (ref 26.6–33.0)
MCH: 32.3 pg (ref 26.0–34.0)
MCHC: 32.1 g/dL (ref 31.5–35.7)
MCHC: 34.3 g/dL (ref 30.0–36.0)
MCV: 96 fL (ref 79–97)
MCV: 94 fL (ref 80.0–100.0)
Monocytes Absolute: 0.5 10*3/uL (ref 0.1–0.9)
Monocytes Absolute: 0.4 10*3/uL (ref 0.1–1.0)
Monocytes Relative: 8 %
Monocytes: 7 %
Neutro Abs: 2.7 10*3/uL (ref 1.7–7.7)
Neutrophils Absolute: 3.4 10*3/uL (ref 1.4–7.0)
Neutrophils Relative %: 57 %
Neutrophils: 48 %
Platelets: 238 10*3/uL (ref 150–450)
Platelets: 206 10*3/uL (ref 150–400)
RBC: 4.07 x10E6/uL (ref 3.77–5.28)
RBC: 4.03 MIL/uL (ref 3.87–5.11)
RDW: 12.4 % (ref 11.7–15.4)
RDW: 12.8 % (ref 11.5–15.5)
WBC: 7.1 10*3/uL (ref 3.4–10.8)
WBC: 4.7 10*3/uL (ref 4.0–10.5)
nRBC: 0 % (ref 0.0–0.2)

## 2023-11-16 LAB — T4F: T4,Free (Direct): 1.33 ng/dL (ref 0.82–1.77)

## 2023-11-16 LAB — BASIC METABOLIC PANEL
Anion gap: 8 (ref 5–15)
BUN/Creatinine Ratio: 50 — ABNORMAL HIGH (ref 12–28)
BUN: 27 mg/dL (ref 8–27)
BUN: 27 mg/dL — ABNORMAL HIGH (ref 8–23)
CO2: 20 mmol/L (ref 20–29)
CO2: 24 mmol/L (ref 22–32)
Calcium: 9.6 mg/dL (ref 8.7–10.3)
Calcium: 9.3 mg/dL (ref 8.9–10.3)
Chloride: 101 mmol/L (ref 96–106)
Chloride: 106 mmol/L (ref 98–111)
Creatinine, Ser: 0.54 mg/dL — ABNORMAL LOW (ref 0.57–1.00)
Creatinine, Ser: 0.49 mg/dL (ref 0.44–1.00)
GFR, Estimated: >60 mL/min (ref >60)
Glucose, Bld: 101 mg/dL — ABNORMAL HIGH (ref 70–99)
Glucose: 64 mg/dL — ABNORMAL LOW (ref 70–99)
Potassium: 8.6 mmol/L (ref 3.5–5.2)
Potassium: 4 mmol/L (ref 3.5–5.1)
Sodium: 136 mmol/L (ref 134–144)
Sodium: 138 mmol/L (ref 135–145)
eGFR: 97 mL/min/{1.73_m2} (ref >59)

## 2023-11-16 LAB — BRAIN NATRIURETIC PEPTIDE: BNP: 19.5 pg/mL (ref 0.0–100.0)

## 2023-11-16 LAB — TSH RFX ON ABNORMAL TO FREE T4: TSH: 4.83 u[IU]/mL — ABNORMAL HIGH (ref 0.450–4.500)

## 2023-11-16 MED ORDER — TRAZODONE HCL 50 MG PO TABS
ORAL_TABLET | ORAL | 5 refills | Status: DC
  Filled 2023-11-16: qty 30, 30d supply, fill #0
  Filled 2023-12-09 (×2): qty 30, 30d supply, fill #1
  Filled 2024-01-08: qty 30, 30d supply, fill #2
  Filled 2024-02-07: qty 30, 30d supply, fill #3
  Filled 2024-03-06 – 2024-03-15 (×3): qty 30, 30d supply, fill #4

## 2023-11-16 MED ORDER — LEVOTHYROXINE SODIUM 125 MCG PO TABS
125.0000 ug | ORAL_TABLET | Freq: Every day | ORAL | 2 refills | Status: DC
Start: 2023-11-16 — End: 2024-02-12
  Filled 2023-11-16: qty 30, 30d supply, fill #0
  Filled 2023-12-11: qty 30, 30d supply, fill #1
  Filled 2024-01-08: qty 30, 30d supply, fill #2

## 2023-11-16 MED ORDER — ZALEPLON 10 MG PO CAPS
10.0000 mg | ORAL_CAPSULE | Freq: Every evening | ORAL | 0 refills | Status: AC | PRN
Start: 2023-11-16 — End: ?
  Filled 2023-11-16: qty 30, 30d supply, fill #0

## 2023-11-16 MED ORDER — ALPRAZOLAM 0.25 MG PO TABS
0.2500 mg | ORAL_TABLET | Freq: Every day | ORAL | 2 refills | Status: DC | PRN
  Filled 2023-11-16: qty 60, 30d supply, fill #0
  Filled 2024-04-12: qty 60, 30d supply, fill #1

## 2023-11-16 NOTE — Discharge Instructions (Signed)
Your potassium was rechecked here today and is normal, potassium can frequently be falsely elevated if you have hemolysis ( some cells break open in the blood).  All of your lab work today has been reassuring.  Please follow-up with your primary care doctor for ongoing evaluation of dizziness.

## 2023-11-16 NOTE — Progress Notes (Signed)
On call nurse called to report a critical potassium levell of 8.6 at 2:26am. Patient outreach times three but the calls went straight to voicemail. Messages left to call back or to report to the ED for further evaluation and management. On review of recent note, patient symptomatic with bradycardia, dizziness, and weakness but in office EKG reassuring. Will retry outreach once the office opens.  Christen Butter, DNP, APRN, FNP-BC

## 2023-11-16 NOTE — ED Provider Notes (Signed)
[place] EMERGENCY DEPARTMENT AT [facility] Provider Note   CSN: [id number] Arrival date & time: 11/16/23  7829     History  Chief Complaint  Patient presents with   Dizziness    Wendy Joyce is a 77 y.o. female.  Wendy Joyce is a 77 y.o. female with a history of hypothyroidism, fibromyalgia, postconcussion syndrome, hyperlipidemia, migraine, who presents to the emergency department for evaluation of abnormal lab result as well as ongoing dizziness and low heart rate.  Patient was seen by her primary care provider 2 days ago and was called this morning and notified that her potassium came back very elevated at 8.6 and told to go immediately to the emergency department.  She saw her PCP for ongoing issues with dizziness since a head injury in July, patient has been diagnosed with postconcussion syndrome with vestibular symptoms and has been doing PT to try and improve this without much improvement.  She had an MRI in August that was unremarkable.  She has not had any sudden change in her dizziness over the past few days, reports is often worse with position changes and often worse after doing her PT exercises.  She also states that the physical therapist told her a few times that her heart rate was low.  Primary care provider noted that her heart rate is often between 40 and 60 during visit 2 days ago.  No syncopal episodes, patient denies chest pain or shortness of breath.  Patient is unsure if any other results on her labs were abnormal.  EKG yesterday showed sinus bradycardia in the office.  No other aggravating or alleviating factors.  The history is provided by the patient, the spouse and medical records.       Home Medications Prior to Admission medications   Medication Sig Start Date End Date Taking? Authorizing Provider  acyclovir ointment (ZOVIRAX) 5 % Apply 1 application. topically daily as needed (for fever blister).    [provider]   albuterol (VENTOLIN HFA) 108 (90 Base) MCG/ACT inhaler Inhale 2 puffs into the lungs every 6 (six) hours as needed for wheezing or shortness of breath. 08/21/23   Alyson Reedy, FNP  ALPRAZolam Prudy Feeler) 0.25 MG tablet Take 1-2 tablets (0.25-0.5 mg total) by mouth daily as needed. (must last 30 days) 08/21/23 12/02/23    cetirizine (ZYRTEC) 10 MG tablet Take 10 mg by mouth daily.    [provider]  COVID-19 mRNA vaccine, Pfizer, (COMIRNATY) syringe Inject into the muscle. 11/08/23   Judyann Munson, MD  donepezil (ARICEPT) 5 MG tablet Take 1 tablet (5 mg total) by mouth 2 (two) times daily. 10/16/23   Alyson Reedy, FNP  erythromycin ophthalmic ointment Place small amount into both eyes at bedtime. 07/05/23     estradiol (ESTRACE) 0.1 MG/GM vaginal cream Insert blueberry size amount of cream on finger (or 0.5 grams with applicator) in vagina daily for 2 weeks, THEN twice weekly. 09/02/22     estradiol (ESTRACE) 0.5 MG tablet Take 1 tablet (0.5 mg total) by mouth daily. 12/29/22   de [place], Raymond J, MD  levothyroxine (SYNTHROID) 112 MCG tablet Take 1 tablet (112 mcg total) by mouth daily. 09/18/23   Alyson Reedy, FNP  metFORMIN (GLUCOPHAGE) 500 MG tablet Take 0.5 tablet by mouth daily for 7 days, THEN 0.5 tablet twice daily for 7 days, THEN 1 tablet in AM and 0.5 tablet in PM for 7 days, THEN 1 tablet twice daily. 10/26/23     montelukast (SINGULAIR)  10 MG tablet Take 1 tablet (10 mg total) by mouth daily for allergies 11/13/23 11/13/24  de [place], Buren Kos, MD  Naltrexone HCl, Pain, 4.5 MG CAPS Prescribed by psychiatry for depression. Once a day. 11/10/22   Tollie Eth, NP  progesterone (PROMETRIUM) 100 MG capsule Take 1 capsule (100 mg total) by mouth before bedtime for 1 week. May increase to 2 capsules to help with sleep. 06/12/23     valACYclovir (VALTREX) 500 MG tablet Take 1 tablet (500 mg total) by mouth 2 (two) times daily. 04/03/23     zaleplon (SONATA) 5 MG capsule Take 1 capsule (5  mg total) by mouth at bedtime as needed for sleep. 08/23/23   Alyson Reedy, FNP  buPROPion (WELLBUTRIN XL) 150 MG 24 hr tablet Take 1 tablet (150 mg total) by mouth. 10/18/21 01/12/22    prazosin (MINIPRESS) 2 MG capsule 1 TO 3 PO QHS Patient not taking: Reported on 06/17/2022 03/03/22 08/16/22    Vilazodone HCl (VIIBRYD) 10 MG TABS 1 PO QD W/DINNER 07/12/22 08/16/22  Tollie Eth, NP      Allergies    Adhesive [tape], Atovaquone-proguanil hcl, Bioflavonoids, Citrus, Emedastine, Escitalopram, Escitalopram oxalate, Ezetimibe-simvastatin, Niacin, Niacin and related, Rosuvastatin, Versed [midazolam], Amoxicillin, and Hydrochlorothiazide    Review of Systems   Review of Systems  Constitutional:  Positive for fatigue. Negative for chills and fever.  Cardiovascular:  Negative for chest pain, palpitations and leg swelling.  Neurological:  Positive for dizziness. Negative for syncope, weakness and numbness.  All other systems reviewed and are negative.   Physical Exam Updated Vital Signs BP (!) 112/59 (BP Location: Left Arm)   Pulse (!) 47   Temp 98.6 F (37 C) (Oral)   Resp 13   Ht 5\' 3"  (1.6 m)   Wt 64.4 kg   SpO2 98%   BMI 25.15 kg/m  Physical Exam Vitals and nursing note reviewed.  Constitutional:      General: She is not in acute distress.    Appearance: Normal appearance. She is well-developed. She is not diaphoretic.  HENT:     Head: Normocephalic and atraumatic.  Eyes:     General:        Right eye: No discharge.        Left eye: No discharge.     Extraocular Movements: Extraocular movements intact.     Pupils: Pupils are equal, round, and reactive to light.     Comments: No nystagmus  Cardiovascular:     Rate and Rhythm: Regular rhythm. Bradycardia present.     Pulses: Normal pulses.     Heart sounds: Normal heart sounds.  Pulmonary:     Effort: Pulmonary effort is normal. No respiratory distress.     Breath sounds: Normal breath sounds. No wheezing or rales.      Comments: Respirations equal and unlabored, patient able to speak in full sentences, lungs clear to auscultation bilaterally  Abdominal:     General: Bowel sounds are normal. There is no distension.     Palpations: Abdomen is soft. There is no mass.     Tenderness: There is no abdominal tenderness. There is no guarding.     Comments: Abdomen soft, nondistended, nontender to palpation in all quadrants without guarding or peritoneal signs  Musculoskeletal:        General: No deformity.     Cervical back: Neck supple.  Skin:    General: Skin is warm and dry.     Capillary Refill: Capillary refill  takes less than 2 seconds.  Neurological:     Mental Status: She is alert and oriented to person, place, and time.     Coordination: Coordination normal.     Comments: Speech is clear, able to follow commands CN III-XII intact Normal strength in upper and lower extremities bilaterally including dorsiflexion and plantar flexion, strong and equal grip strength Sensation normal to light and sharp touch Moves extremities without ataxia, coordination intact  Psychiatric:        Mood and Affect: Mood normal.        Behavior: Behavior normal.     ED Results / Procedures / Treatments   Labs (all labs ordered are listed, but only abnormal results are displayed) Labs Reviewed  BASIC METABOLIC PANEL - Abnormal; Notable for the following components:      Result Value   Glucose, Bld 101 (*)    BUN 27 (*)    All other components within normal limits  CBC WITH DIFFERENTIAL/PLATELET    EKG EKG Interpretation Date/Time:  Thursday November 16 2023 09:22:20 EST Ventricular Rate:  54 PR Interval:  223 QRS Duration:  129 QT Interval:  442 QTC Calculation: 419 R Axis:   -43  Text Interpretation: Sinus rhythm Prolonged PR interval Interpretation limited secondary to artifact Otherwise no significant change Confirmed by Elayne Snare ([id number]) on 11/16/2023 9:23:23 AM  Radiology No results  found.  Procedures Procedures    Medications Ordered in ED Medications - No data to display  ED Course/ Medical Decision Making/ A&P                                 Medical Decision Making Amount and/or Complexity of Data Reviewed Labs: ordered.   77 year old female sent in for elevated potassium on outpatient labs collected on 11/19.  Has been having issues with ongoing dizziness and some noted bradycardia but the symptoms are unchanged, related to postconcussion syndrome, has had reassuring brain MRI and no new neurologic symptoms today.  Potassium on outpatient labs was 8.6 but I spoke with patient's PCP NP Alyson Reedy, all other labs were within normal limits and most importantly patient's renal function was normal with a creatinine of 0.51.  High suspicion for hemolysis, will recheck labs here today.  EKG is sinus bradycardia with no signs of hyperkalemia on EKG.  I reviewed patient's chart and it appears that her baseline heart rate is most often between 45 and 60 for several years, do not suspect that bradycardia is new or contributing to patient's symptoms or conditions today.  Patient is neurologically intact.  Dizziness is exacerbated with position change and she reports it is worse when doing her physical therapy exercises for vestibular rehab.  Do not feel this warrants further emergent evaluation.  On lab work today patient's potassium is 4.0 and there are no other significant derangements.  Provided patient and husband with reassurance and reached out to patient's primary care provider to let her know labs today were reassuring.  Patient will follow-up with her primary care provider for ongoing treatment and evaluation of dizziness.  At this time there does not appear to be any evidence of an acute emergency medical condition requiring further emergent evaluation and the patient appears stable for discharge with appropriate outpatient follow up. Diagnosis and return  precautions discussed with patient who verbalizes understanding and is agreeable to discharge.           Final  Clinical Impression(s) / ED Diagnoses Final diagnoses:  Abnormal laboratory test    Rx / DC Orders ED Discharge Orders     None         Simeon Craft, PA-C 11/16/23 1215    Elayne Snare K, DO 11/16/23 1327

## 2023-11-16 NOTE — ED Triage Notes (Signed)
Pt arrived from home POV with c/o dizziness since a head injury in the summer. Seen PCP yesterday for this and was notified this am for abnormal K level.

## 2023-11-16 NOTE — Telephone Encounter (Signed)
Messaged received from co-worker, Christen Butter, this morning, regarding critical lab value that resulted earlier this morning. Patient was called this morning and notified of critical lab results regarding her potassium and advised to go immediately to closest ED. Patient verbalized an understanding and is heading there.

## 2023-11-16 NOTE — Telephone Encounter (Signed)
Patient called to review other lab results from this morning. Discussed thyroid results, slightly elevated TSH, which may be contributing to her bradycardia. Plan to repeat thyroid function in 6 weeks. Discussed ordering 14-day heart monitor and she would prefer to not do this at this time. She reports she has had this performed in the past. I consider this reasonable since review of her chart appears that she has had a history of bradycardia.

## 2023-11-21 ENCOUNTER — Ambulatory Visit: Payer: Medicare Other | Admitting: Physical Therapy

## 2023-11-21 DIAGNOSIS — R69 Illness, unspecified: Secondary | ICD-10-CM | POA: Diagnosis not present

## 2023-11-27 ENCOUNTER — Encounter: Payer: Self-pay | Admitting: Physical Therapy

## 2023-11-27 ENCOUNTER — Ambulatory Visit: Payer: Medicare Other | Attending: Orthopedic Surgery | Admitting: Physical Therapy

## 2023-11-27 DIAGNOSIS — R42 Dizziness and giddiness: Secondary | ICD-10-CM | POA: Insufficient documentation

## 2023-11-27 DIAGNOSIS — R2681 Unsteadiness on feet: Secondary | ICD-10-CM | POA: Diagnosis not present

## 2023-11-27 NOTE — Therapy (Signed)
OUTPATIENT PHYSICAL THERAPY VESTIBULAR TREATMENT     Patient Name: Wendy Joyce MRN: [id number] DOB:1946/11/08, 77 y.o., female Today's Date: 11/27/2023  END OF SESSION:  PT End of Session - 11/27/23 1526     Visit Number 8    Number of Visits 10    Date for PT Re-Evaluation 12/12/23    Authorization Type [employer]    PT Start Time 1525    PT Stop Time 1607    PT Time Calculation (min) 42 min    Equipment Utilized During Treatment --    Activity Tolerance Patient tolerated treatment well   dizziness   Behavior During Therapy [facility] for tasks assessed/performed               Past Medical History:  Diagnosis Date   Abdominal pain, generalized 01/29/2015   Abnormal thyroid function test 02/25/2016   ADHD (attention deficit hyperactivity disorder)    Allergic rhinitis, unspecified 01/29/2015   Allergy    Anemia    DOE   Ankle sprain 03/16/2016   Anxiety 01/29/2015   Arthritis    hands   Chronic kidney disease 2012   kidney stones   Constipation 01/29/2015   Decreased libido 05/02/2018   Disorder of the skin and subcutaneous tissue, unspecified 01/29/2015   DJD (degenerative joint disease)    hand   Dysthymia    Endometriosis    External otitis    Family history of coronary artery disease 05/02/2018   Fatigue due to excessive exertion 02/24/2016   Fever blister    Fibromyalgia 01/29/2015   Frequency of micturition 01/29/2015   Functional dyspepsia 01/29/2015   Gastro-esophageal reflux disease without esophagitis 01/29/2015   Headache(784.0)    High cholesterol    Hyperlipidemia 09/12/2016   Hypersomnia    Hypothyroidism    IBS (irritable bowel syndrome) 05/02/2018   Insomnia    Internal hemorrhoids    Irritable 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 01/29/2015   Labial pain 11/30/2016   Lactose intolerance 12/14/2016   Lactose intolerance in adult 09/12/2016   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   Left ureteral stone    LP (lichen  planus) 01/29/2015   Major depressive disorder, single episode, unspecified    Migraine with aura, not intractable, without status migrainosus    Mixed hyperlipidemia    Mouth pain 01/07/2016   Osteoarthritis of hip    Osteopenia determined by x-ray 03/02/2017   Personal history of estrogen therapy    Phlebitis and thrombophlebitis of the leg    Pityriasis alba 01/29/2015   Pure hypercholesterolemia 06/29/2016   Rectum pain 09/12/2016   Recurrent sinus infections 02/24/2016   Rhinosinusitis 01/07/2016   Rosacea    Sinusitis 12/24/2015   Tinnitus of left ear 01/29/2015   Umbilical hernia without obstruction and without gangrene 01/29/2015   Umbilical pain 05/02/2018   Unspecified urinary incontinence 01/07/2016   Urticaria    UTI (urinary tract infection)    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   Vitamin D deficiency    Past Surgical History:  Procedure Laterality Date   BREAST ENHANCEMENT SURGERY     BREAST REDUCTION SURGERY Bilateral 2018   COLONOSCOPY     COLONOSCOPY WITH PROPOFOL N/A 02/25/2013   Procedure: COLONOSCOPY WITH PROPOFOL;  Surgeon: Charolett Bumpers, MD;  Location: [facility];  Service: Endoscopy;  Laterality: N/A;   left hand surgery  12/28/2022   mini facelift  2000   NASAL SINUS SURGERY  REDUCTION MAMMAPLASTY     ROTATOR CUFF REPAIR     Right   ROTATOR CUFF REPAIR     Left   SEPTOPLASTY WITH ETHMOIDECTOMY, AND MAXILLARY ANTROSTOMY Bilateral 2006   TOTAL ABDOMINAL HYSTERECTOMY     ovaries remain   TUBAL LIGATION     umbicial hernia     Patient Active Problem List   Diagnosis Date Noted   Bradycardia, sinus 11/15/2023   Primary insomnia 11/15/2023   Right lateral abdominal pain 05/17/2023   RUQ abdominal pain 04/12/2023   Depression, recurrent (HCC) 12/25/2022   Preoperative clearance 11/23/2022   Encounter for counseling 10/14/2022   Influenza 09/18/2022   Pain of left hand 06/29/2022   Vasomotor  symptoms due to menopause 05/30/2022   Pre-diabetes 05/30/2022   Anxiety disorder 03/17/2022   Allergic rhinitis 03/17/2022   Constipation 03/17/2022   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   Exertional dyspnea 03/17/2022   Family history of coronary artery disease 03/17/2022   Weakness with dizziness 03/17/2022   Fever blister 03/17/2022   Frequency of micturition 03/17/2022   Irritable bowel syndrome 03/17/2022   Lactose intolerance 03/17/2022   Left anterior fascicular block 03/17/2022   Lichen planus 03/17/2022   Panniculitis 03/17/2022   Refractory migraine with aura 03/17/2022   Statin not tolerated 03/17/2022   Thrombophlebitis of superficial veins of lower extremity 03/17/2022   Bilateral tinnitus 03/17/2022   Umbilical hernia 03/17/2022   Urinary incontinence 03/17/2022   Urticaria due to drug allergy 03/17/2022   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 03/17/2022   Sexual dysfunction 03/17/2022   Imbalance 03/17/2022   Fibromyalgia 09/21/2021   Hyperlipidemia 09/21/2021   Hashimoto's thyroiditis 03/31/2021   Hypothyroidism 09/27/2013    PCP: Alyson Reedy, FNP REFERRING PROVIDER: Butch Penny, NP  REFERRING DIAG: 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 Had further testing at the ER - all the labs were reassuring.  Er ER note "it appears that her baseline heart rate is most often between 45 and 60 for several years, do not suspect that bradycardia is new or contributing to patient's symptoms or conditions today". Reports her PCP made her Synthroid a higher dose and that has helped her feel a little more energy. Notes dizziness  is still there and it hasn't gone away. Requests to be in a dim lit room with little noise today.   Pt accompanied by: self  PERTINENT HISTORY: bladder surgery September 2024 for most recent (see below for precautions), prior history of concussion 2021, PTSD (reports triggers can include loud dizzy space), and anxiety in large groups   PAIN:  Are you having pain? Has shoulder and ankle pain with arthritis    PRECAUTIONS: Fall and Other: no core work for at least one more week   RED FLAGS: Bowel or bladder incontinence: Yes: bladder incontinence stress and requires use of internal sling to support, managed and consistent with known medical history     WEIGHT BEARING RESTRICTIONS: Yes nothing over 20 lbs for lifting   FALLS: Has patient fallen in last 6 months? Yes. Number of falls 1 fall with concussion and then some close calls about 6 since then  LIVING ENVIRONMENT: Lives with:  lives with their spouse Lives in: House/apartment Stairs: Yes - with railing about 15-20 to go up and down  Has following equipment at home: Single point cane  PLOF: Reports being fulling independent, works as an Tree surgeon   PATIENT GOALS: "Patient wants to feel secure when walking in store (not feeling like needs a cart) and would like to get back to dance, yoga, and exercise."   VITALS:  There were no vitals filed for this visit.    OBJECTIVE:  Note: Objective measures were completed at Evaluation unless otherwise noted.  DIAGNOSTIC FINDINGS:   MRI Brain 08/14/2023 IMPRESSION: No evidence of acute abnormality.  COGNITION: Overall cognitive status: Within functional limits for tasks assessed   SENSATION: WFL  POSTURE:  Grossly WFL  Cervical ROM:    Active A/PROM (deg) eval  Flexion WFL  Extension WFL  Right lateral flexion WFL  Left lateral flexion WFL  Right rotation WFL  Left rotation WFL  (Blank rows = not tested)  GAIT: Gait pattern:  hesitancy with initial few steps, cervical  guarding and slow on turns Distance walked: short clinic distances < 60 feet Assistive device utilized: None Level of assistance: SBA   Today's Treatment:  Performed in private treatment room with lights dimmed away from the noise of therapy gym.       M-CTSIB  Condition 1: Firm Surface, EO 30 Sec, Normal Sway  Condition 2: Firm Surface, EC 30 Sec, Mild Sway  Condition 3: Foam Surface, EO 30 Sec, Mild Sway  Condition 4: Foam Surface, EC 11.4 Seconds, ears feel like they are ringing more, incr dizziness and dizziness improves when sitting down      MOTION SENSITIVITY:                         Motion Sensitivity Quotient   Intensity: 0 = none, 1 = Lightheaded, 2 = Mild, 3 = Moderate, 4 = Severe, 5 = Vomiting   Intensity (10/31/23) 11/27/23  1. Sitting to supine 0 0  2. Supine to L side 1 0  3. Supine to R side 0 0  4. Supine to sitting Felt a little pressure rush when sitting up  1.5  5. L Hallpike-Dix 2   6. Up from L  2   7. R Hallpike-Dix 0   8. Up from R  1   9. Sitting, head  tipped to L knee 2, noted ears were ringing louder when going down 1  10. Head up from L  knee 2 1  11. Sitting, head  tipped to R knee 2 1  12. Head up from R  knee 2 1  13. Sitting head turns x5 2-3 3 (reports ears are ringing more)  14.Sitting head nods x5 1-2, felt a "sloshing" feeling in head afterwards 2  15. In stance, 180  turn to L  3 1.5  16. In stance, 180  turn to R 3 2-3, feels sloshy    Access Code: [email or web address] URL: [email or web address] Date: 11/27/2023 Prepared by: Sherlie Ban  Reviewed/updated HEP, see MedBridge for more details   Exercises - Single Leg Stance with Support  - 1 x daily - 5 x weekly - 1 sets - 1-2 reps - 10 sec hold - Standing Marching  - 1 x daily - 5 x weekly - 2 sets - 5 reps - Wide Stance with Eyes Closed on Foam Pad  - 1 x daily - 5 x weekly - 3 sets -  20-30 hold - Standing with Head Rotation  - 1 x daily - 5 x weekly - 2 sets - 5  reps   PATIENT EDUCATION: Education details: Results of goals, review of HEP and progressions, adding one more appt next week due to missed appt - pt would then like to go on hold and continue therapy in January due to having a lot going on for the holidays.  Person educated: Patient Education method: Programmer, multimedia, Demonstration, Verbal cues, and Handouts,  Education comprehension: verbalized understanding, returned demonstration, and needs further education  HOME EXERCISE PROGRAM: VOR x1 Standing Plain Background: 30 seconds in Horizontal and Vertical direction, progressing to 60 seconds when 30 seconds gets easy   Access Code: [email or web address] URL: [email or web address] Date: 11/27/2023 Prepared by: Sherlie Ban  Exercises - Single Leg Stance with Support  - 1 x daily - 5 x weekly - 1 sets - 1-2 reps - 10 sec hold - Standing Marching  - 1 x daily - 5 x weekly - 2 sets - 5 reps - Wide Stance with Eyes Closed on Foam Pad  - 1 x daily - 5 x weekly - 3 sets - 20-30 hold - Standing with Head Rotation  - 1 x daily - 5 x weekly - 2 sets - 5 reps   Bending / Picking Up Objects    Sitting, slowly bend head down and pick up object on the floor. Return to upright position. Hold position until symptoms subside. Repeat __5__ times per session. Do __1-2__ sessions per day.   Sit to Side-Lying    Sit on edge of bed. 1. Turn head 45 to right. 2. Maintain head position and lie down slowly on left side. Hold until symptoms subside. 3. Sit up slowly. Hold until symptoms subside. 4. Turn head 45 to left. 5. Maintain head position and lie down slowly on right side. Hold until symptoms subside. 6. Sit up slowly. Repeat sequence __5__ times per session. Do __1-2__ sessions per day.      GOALS: Goals reviewed with patient? Yes  SHORT TERM GOALS: Target date: 11/21/2023  Patient will demonstrate independence with initial HEP to continue to progress between physical therapy sessions.    Baseline: reviewed and provided appropriate updates on 12/2 Goal status: MET  2.  Patient will improve RPQ-3 score to </= 4 to indicate reduced severity of post-concussive symptoms to progress towards PLOF.   Baseline: 6 Goal status: INITIAL  3.  Pt will perform items on MSQ as 1-2/5 or less in order to demo improved motion sensitivity.  Baseline: see above chart  Goal status: PARTIALLY MET  4.  SOT to be assessed and LTG written Baseline: Not appropriate at this time due to pt's symptoms, updated LTG for mCTSIB instead   Goal status: N/A   LONG TERM GOALS: Target date: 12/12/2023  Patient will report demonstrate independence with final HEP in order to maintain current gains and continue to progress after physical therapy discharge.   Baseline: To be provided Goal status: INITIAL  2.  Patient will improve RPQ-13 score to </= 3 per item to indicate reduced severity of post-concussive symptoms to progress towards PLOF.   Baseline: 4/5 greatest Goal status: INITIAL  3.  Pt will perform head motions and turning to R/L on MSQ to a 1/5 or less to demo improved dizziness  Baseline: 2-3/5 dizziness  Goal status: INITIAL  4.  Pt will improve condition 4 of mCTSIB to at least 15 seconds to demo improved vestibular input  for balance  Baseline: 1-2 seconds > 11.4 seconds  Goal status: INITIAL  5.  Patient will reduce score on written in components on [place] (Trouble turning as walk and Trouble seated to up and walk) to 2/4 or less to indicate reduction in concussion symptoms. Baseline: 3/4 Goal status: INITIAL  ASSESSMENT:  CLINICAL IMPRESSION: Today's skilled session focused on assessing STGs. Pt partially met STG #3, improved majority of items on MSQ, but still having dizziness with head motions and turning. STG not appropriate as SOT not appropriate due to pt's current symptoms. Updated LTG with mCTSIB, as pt only able to hold condition 4 for 11.5 seconds, indicating decr  vestibular input. Remainder of session focused on reviewing and progressing pt's HEP for vestibular input and habituation to head motions. Pt needing to be in a private treatment room today due to symptoms and unable to tolerate busy gym with noise. Will continue per POC.    OBJECTIVE IMPAIRMENTS: Abnormal gait, decreased balance, difficulty walking, dizziness, and pain.   ACTIVITY LIMITATIONS: carrying, lifting, bending, standing, transfers, and locomotion level  PARTICIPATION LIMITATIONS: meal prep, shopping, community activity, and yard work  PERSONAL FACTORS: Past/current experiences, Time since onset of injury/illness/exacerbation, and 3+ comorbidities: see above  are also affecting patient's functional outcome.   REHAB POTENTIAL: Good  CLINICAL DECISION MAKING: Evolving/moderate complexity  EVALUATION COMPLEXITY: Moderate   PLAN:  PT FREQUENCY: 2x/week  PT DURATION: 7 weeks  PLANNED INTERVENTIONS: [id number]- PT Re-evaluation, 97110-Therapeutic exercises, 97530- Therapeutic activity, 97112- Neuromuscular re-education, 97535- Self Care, [id number]- Manual therapy, [id number]- Gait training, and [phone]- [place] repositioning  PLAN FOR NEXT SESSION:    progress balance on compliant surface, continue vestibular exercises, habituation, other pickup tasks  work on turning and reaching tasks   CHECK LAST STG     Drake Leach, PT, DPT 11/27/2023, 4:17 PM

## 2023-11-28 DIAGNOSIS — R69 Illness, unspecified: Secondary | ICD-10-CM | POA: Diagnosis not present

## 2023-11-29 ENCOUNTER — Ambulatory Visit: Payer: Medicare Other | Admitting: Physical Therapy

## 2023-12-05 ENCOUNTER — Encounter: Payer: Self-pay | Admitting: Physical Therapy

## 2023-12-08 DIAGNOSIS — R69 Illness, unspecified: Secondary | ICD-10-CM | POA: Diagnosis not present

## 2023-12-09 ENCOUNTER — Other Ambulatory Visit (HOSPITAL_BASED_OUTPATIENT_CLINIC_OR_DEPARTMENT_OTHER): Payer: Self-pay

## 2023-12-13 ENCOUNTER — Other Ambulatory Visit (HOSPITAL_BASED_OUTPATIENT_CLINIC_OR_DEPARTMENT_OTHER): Payer: Self-pay

## 2023-12-16 ENCOUNTER — Other Ambulatory Visit (HOSPITAL_BASED_OUTPATIENT_CLINIC_OR_DEPARTMENT_OTHER): Payer: Self-pay | Admitting: Family Medicine

## 2023-12-18 ENCOUNTER — Other Ambulatory Visit (HOSPITAL_BASED_OUTPATIENT_CLINIC_OR_DEPARTMENT_OTHER): Payer: Self-pay

## 2023-12-18 MED ORDER — METFORMIN HCL 500 MG PO TABS: ORAL_TABLET | ORAL | 0 refills | Status: AC

## 2023-12-18 MED ORDER — ESTRADIOL 0.5 MG PO TABS
0.5000 mg | ORAL_TABLET | Freq: Every day | ORAL | 3 refills | Status: DC
  Filled 2023-12-18: qty 90, 90d supply, fill #0
  Filled 2024-03-15: qty 90, 90d supply, fill #1
  Filled 2024-06-12: qty 90, 90d supply, fill #2

## 2024-01-08 ENCOUNTER — Telehealth: Payer: Self-pay | Admitting: Adult Health

## 2024-01-08 ENCOUNTER — Other Ambulatory Visit (HOSPITAL_BASED_OUTPATIENT_CLINIC_OR_DEPARTMENT_OTHER): Payer: Self-pay

## 2024-01-08 NOTE — Telephone Encounter (Signed)
Pt confirming appt.

## 2024-01-13 ENCOUNTER — Other Ambulatory Visit (HOSPITAL_BASED_OUTPATIENT_CLINIC_OR_DEPARTMENT_OTHER): Payer: Self-pay

## 2024-01-18 ENCOUNTER — Ambulatory Visit: Payer: Medicare Other | Admitting: Neurology

## 2024-01-18 ENCOUNTER — Ambulatory Visit: Payer: Medicare PPO | Admitting: Neurology

## 2024-01-18 ENCOUNTER — Other Ambulatory Visit (HOSPITAL_BASED_OUTPATIENT_CLINIC_OR_DEPARTMENT_OTHER): Payer: Self-pay

## 2024-01-18 VITALS — BP 104/57 | HR 61 | Ht 64.5 in | Wt 142.0 lb

## 2024-01-18 DIAGNOSIS — H9313 Tinnitus, bilateral: Secondary | ICD-10-CM

## 2024-01-18 DIAGNOSIS — F0781 Postconcussional syndrome: Secondary | ICD-10-CM

## 2024-01-18 DIAGNOSIS — R001 Bradycardia, unspecified: Secondary | ICD-10-CM

## 2024-01-18 MED ORDER — ATOMOXETINE HCL 10 MG PO CAPS
10.0000 mg | ORAL_CAPSULE | Freq: Every day | ORAL | 2 refills | Status: DC
Start: 2024-01-18 — End: 2024-04-11
  Filled 2024-01-18: qty 30, 30d supply, fill #0
  Filled 2024-02-11: qty 30, 30d supply, fill #1
  Filled 2024-03-12: qty 30, 30d supply, fill #2

## 2024-01-18 NOTE — Progress Notes (Signed)
Provider:  Melvyn Novas, MD  Primary Care Physician:  Alyson Reedy, FNP [address] [place] [address]     Referring Provider: Alyson Reedy, Fnp [address] [place],  [place] [address]          Chief Complaint according to patient   Patient presents with:                HISTORY OF PRESENT ILLNESS:  Wendy Joyce is a 78 y.o. female patient who is here for revisit 01/18/2024 for  follow up after a new concussion- 09-10-2023 visit with Butch Penny, NP .  Chief concern according to patient :  " I lost my balance after a fall, had hit my head,  I saw stars , was referred for MRI and rehab, which has helped". Some of my symptoms were the same as after a previous concussion.  See notes from 2021 , 2022. Also ophthalmic migraines, but none recently.  New onset : Insomnia, Balance trouble,  off balance when turning, blurred vision or distorted images  on TV- , memory: at times  confused"  The day after my July 2024 fall  I had all these symptoms of TBI : the may eyes were all red, and a dent was felt on my scalp.  Dx , we looked at MRIs , Postconcussion  syndrome:  her documented bradycardia may have let to fainting spell.  She also has been dependent on synthroid, has not tolerated a reduction.      03-09-2021: Reviist for  [name] ,a 78 y.o. year old patient with postconcussion syndrome.  I have requested a cervical spine MRI as well as a brain MRI to evaluate the patient's concern of fall, postconcussive headaches and vertigo.  Her brain MRI performed on 12/13/2020 showed mild generalized cortical atrophy in normal range for age.  Some negligible chronic microvascular ischemic changes and no acute findings normal enhancement pattern.   The MRI was interpreted by Despina Arias, the MRI of the cervical spine was performed on the same day with and without contrast it showed multilevel degenerative changes of the cervical spine but normal any evidence  of compression of the spinal canal or foramina.  Headaches have improved. aricept was given based on anecdotal evidence for improvement of cognitive function. She likes the effect and can continue to use it, I will refill. She has tried to wean off klonopin and developed RLS, was given sertraline by dr Duanne Guess, MD. .  She underwent vestibular rehab with Lauralee Evener and she felt well treated and noted improvement, dry needling increased ROM for the neck, reduced the stiffness . Vertigo is much improved. Central , not vestibular.  She continues to participate in Yoga as a training for balance and equilibrium.    We reviewed all studies.     She was seen here in a consultation, upon referral on 12/15/2-21 from Dr. Kevan Ny.  Mrs. [name] describes that on November 06, 2020 she was in a boutique's dressing room and tried on a pair of jeans while standing.  She lost balance but was unable to break the fall or respond adequately she saw herself literally falling to the ground she fell backwards as he describes it like a rock and hit the back of her head however the floor there was carpeted but in the fall she hit the wall mounted mirror in her cabin.  She feels that there was no loss of consciousness  or loss of awareness she had to maneuver on her side and then got herself up without assistance of another person. The second fall occurred on Friday, 3 December of this year she was actually sitting down on a stool in her own dressing area which is part of her bathroom and has a hard marble floor.  She was putting on leggings and stood up slowly to hang up her bathroom and during the day rotating movement to reach the clock she again fell backwards she felt no warning aura anything she just watched herself in slow motion and her head hit heart on the marble floor in the closet door.  She also injured her right shoulder elbow right hip and hand.  She just had a surgical intervention for the right hand.   She reports  her neck has remained stiff and tender, she has noted more crackling noises, she has tinnitus, stronger in the left ear- she moves very slow for no food.  Again there was no abnormal enhancement here noted either.  The patient also underwent nerve conduction studies for pain in the right arm that was related to the fall.  Needle examination of the upper extremity was normal there was no electrodiagnostic evidence of an interruption of the large neural fibers at the time.  Electromyography showed no abnormal stimulation pattern.   , is feeling at risk , and not safe, avoiding any fast head or truncal movements.  She has a remote history of whiplash, from college days.   Review of Systems: Out of a complete 14 system review, the patient complains of only the following symptoms, and all other reviewed systems are negative.:  Fatigue, sleepiness , bradycardia. -      Social History   Socioeconomic History   Marital status: Married    Spouse name: Not on file   Number of children: Not on file   Years of education: Not on file   Highest education level: Not on file  Occupational History   Not on file  Tobacco Use   Smoking status: Never   Smokeless tobacco: Never  Vaping Use   Vaping status: Never Used  Substance and Sexual Activity   Alcohol use: Yes    Alcohol/week: 2.0 standard drinks of alcohol    Types: 2 Glasses of wine per week    Comment: weekends occ   Drug use: No   Sexual activity: Yes    Partners: Male    Birth control/protection: Post-menopausal  Other Topics Concern   Not on file  Social History Narrative   Married   Regular exercise: yes; 2 x a week with a trainer   Caffeine use: coffee daily   Social Drivers of Corporate investment banker Strain: Low Risk  (02/28/2023)   Overall Financial Resource Strain (CARDIA)    Difficulty of Paying Living Expenses: Not hard at all  Food Insecurity: Low Risk  (11/03/2023)   Received from [facility]   Hunger Vital Sign     Worried About Running Out of Food in the Last Year: Never true    Ran Out of Food in the Last Year: Never true  Transportation Needs: No Transportation Needs (11/03/2023)   Received from [facility]    In the past 12 months, has lack of reliable transportation kept you from medical appointments, meetings, work or from getting things needed for daily living? : No  Physical Activity: Inactive (02/28/2023)   Exercise Vital Sign  Days of Exercise per Week: 0 days    Minutes of Exercise per Session: 0 min  Stress: No Stress Concern Present (02/28/2023)   [employer] of Occupational Health - Occupational Stress Questionnaire    Feeling of Stress : Not at all  Social Connections: Socially Integrated (02/28/2023)   Social Connection and Isolation Panel [NHANES]    Frequency of Communication with Friends and Family: More than three times a week    Frequency of Social Gatherings with Friends and Family: More than three times a week    Attends Religious Services: More than 4 times per year    Active Member of Clubs or Organizations: Yes    Attends Engineer, structural: More than 4 times per year    Marital Status: Married    Family History  Problem Relation Age of Onset   Stroke Father    Hypertension Father    Cancer Father    Breast cancer Maternal Grandmother    Colon cancer Neg Hx    Colon polyps Neg Hx    Esophageal cancer Neg Hx    Rectal cancer Neg Hx    Stomach cancer Neg Hx     Past Medical History:  Diagnosis Date   Abdominal pain, generalized 01/29/2015   Abnormal thyroid function test 02/25/2016   ADHD (attention deficit hyperactivity disorder)    Allergic rhinitis, unspecified 01/29/2015   Allergy    Anemia    DOE   Ankle sprain 03/16/2016   Anxiety 01/29/2015   Arthritis    hands   Chronic kidney disease 2012   kidney stones   Constipation 01/29/2015   Decreased libido 05/02/2018   Disorder of the skin and subcutaneous tissue,  unspecified 01/29/2015   DJD (degenerative joint disease)    hand   Dysthymia    Endometriosis    External otitis    Family history of coronary artery disease 05/02/2018   Fatigue due to excessive exertion 02/24/2016   Fever blister    Fibromyalgia 01/29/2015   Frequency of micturition 01/29/2015   Functional dyspepsia 01/29/2015   Gastro-esophageal reflux disease without esophagitis 01/29/2015   Headache(784.0)    High cholesterol    Hyperlipidemia 09/12/2016   Hypersomnia    Hypothyroidism    IBS (irritable bowel syndrome) 05/02/2018   Insomnia    Internal hemorrhoids    Irritable 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 01/29/2015   Labial pain 11/30/2016   Lactose intolerance 12/14/2016   Lactose intolerance in adult 09/12/2016   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   Left ureteral stone    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   Major depressive disorder, single episode, unspecified    Migraine with aura, not intractable, without status migrainosus    Mixed hyperlipidemia    Mouth pain 01/07/2016   Osteoarthritis of hip    Osteopenia determined by x-ray 03/02/2017   Personal history of estrogen therapy    Phlebitis and thrombophlebitis of the leg    Pityriasis alba 01/29/2015   Pure hypercholesterolemia 06/29/2016   Rectum pain 09/12/2016   Recurrent sinus infections 02/24/2016   Rhinosinusitis 01/07/2016   Rosacea    Sinusitis 12/24/2015   Tinnitus of left ear 01/29/2015   Umbilical hernia without obstruction and without gangrene 01/29/2015   Umbilical pain 05/02/2018   Unspecified urinary incontinence 01/07/2016   Urticaria    UTI (urinary tract infection)    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   Vitamin D deficiency     Past Surgical History:  Procedure Laterality Date   BREAST ENHANCEMENT SURGERY     BREAST REDUCTION SURGERY Bilateral 2018   COLONOSCOPY     COLONOSCOPY WITH PROPOFOL N/A 02/25/2013   Procedure: COLONOSCOPY  WITH PROPOFOL;  Surgeon: Charolett Bumpers, MD;  Location: [facility];  Service: Endoscopy;  Laterality: N/A;   left hand surgery  12/28/2022   mini facelift  2000   NASAL SINUS SURGERY     REDUCTION MAMMAPLASTY     ROTATOR CUFF REPAIR     Right   ROTATOR CUFF REPAIR     Left   SEPTOPLASTY WITH ETHMOIDECTOMY, AND MAXILLARY ANTROSTOMY Bilateral 2006   TOTAL ABDOMINAL HYSTERECTOMY     ovaries remain   TUBAL LIGATION     umbicial hernia       Current Outpatient Medications on File Prior to Visit  Medication Sig Dispense Refill   acyclovir ointment (ZOVIRAX) 5 % Apply 1 application. topically daily as needed (for fever blister).     albuterol (VENTOLIN HFA) 108 (90 Base) MCG/ACT inhaler Inhale 2 puffs into the lungs every 6 (six) hours as needed for wheezing or shortness of breath. 18 g 1   ALPRAZolam (XANAX) 0.25 MG tablet Take 1-2 tablets (0.25-0.5 mg total) by mouth daily as needed. 60 tablet 2   cetirizine (ZYRTEC) 10 MG tablet Take 10 mg by mouth daily.     COVID-19 mRNA vaccine, Pfizer, (COMIRNATY) syringe Inject into the muscle. 0.3 mL 0   donepezil (ARICEPT) 5 MG tablet Take 1 tablet (5 mg total) by mouth 2 (two) times daily. 180 tablet 1   erythromycin ophthalmic ointment Place small amount into both eyes at bedtime. 3.5 g 1   estradiol (ESTRACE) 0.1 MG/GM vaginal cream Insert blueberry size amount of cream on finger (or 0.5 grams with applicator) in vagina daily for 2 weeks, THEN twice weekly. 42.5 g 3   estradiol (ESTRACE) 0.5 MG tablet Take 1 tablet (0.5 mg total) by mouth daily. 90 tablet 3   levothyroxine (SYNTHROID) 125 MCG tablet Take 1 tablet (125 mcg total) by mouth daily. 30 tablet 2   metFORMIN (GLUCOPHAGE) 500 MG tablet Take 0.5 tablet by mouth daily for 7 days, THEN 0.5 tablet twice daily for 7 days, THEN 1 tablet in AM and 0.5 tablet in PM for 7 days, THEN 1 tablet twice daily. 90 tablet 0   montelukast (SINGULAIR) 10 MG tablet Take 1 tablet (10 mg total) by mouth  daily for allergies 90 tablet 0   Naltrexone HCl, Pain, 4.5 MG CAPS Prescribed by psychiatry for depression. Once a day. 30 capsule 0   progesterone (PROMETRIUM) 100 MG capsule Take 1 capsule (100 mg total) by mouth before bedtime for 1 week. May increase to 2 capsules to help with sleep. 90 capsule 1   traZODone (DESYREL) 50 MG tablet Take ONE-QUARTER to 1 tablet (12.5-50 mg total) by mouth at bedtime. 30 tablet 5   valACYclovir (VALTREX) 500 MG tablet Take 1 tablet (500 mg total) by mouth 2 (two) times daily. 180 tablet 2   zaleplon (SONATA) 10 MG capsule Take 1 capsule (10 mg total) by mouth at bedtime as needed for sleep. 30 capsule 0   [DISCONTINUED] buPROPion (WELLBUTRIN XL) 150 MG 24 hr tablet Take 1 tablet (150 mg total) by mouth. 90 tablet 2   [DISCONTINUED] prazosin (MINIPRESS) 2 MG capsule 1 TO 3 PO QHS (Patient not taking: Reported on 06/17/2022) 90 capsule 5   [DISCONTINUED] Vilazodone HCl (VIIBRYD) 10 MG TABS 1 PO QD  W/DINNER 90 tablet 3   No current facility-administered medications on file prior to visit.    Allergies  Allergen Reactions   Adhesive [Tape] Dermatitis   Atovaquone-Proguanil Hcl Other (See Comments)   Bioflavonoids    Citrus Nausea And Vomiting   Emedastine Nausea And Vomiting   Escitalopram Hives   Escitalopram Oxalate Hives   Ezetimibe-Simvastatin Other (See Comments)   Niacin Other (See Comments)   Niacin And Related     Rash and breathing breathing problems   Rosuvastatin Other (See Comments)   Versed [Midazolam] Nausea And Vomiting    For a week per pt   Amoxicillin Rash    Unsure of reaction/ was instructed not to take drug. Unknown reaction    Hydrochlorothiazide Rash     DIAGNOSTIC DATA (LABS, IMAGING, TESTING) - I reviewed patient records, labs, notes, testing and imaging myself where available.  Lab Results  Component Value Date   WBC 4.7 11/16/2023   HGB 13.0 11/16/2023   HCT 37.9 11/16/2023   MCV 94.0 11/16/2023   PLT 206  11/16/2023      Component Value Date/Time   NA 138 11/16/2023 0941   NA 136 11/15/2023 0841   K 4.0 11/16/2023 0941   CL 106 11/16/2023 0941   CO2 24 11/16/2023 0941   GLUCOSE 101 (H) 11/16/2023 0941   BUN 27 (H) 11/16/2023 0941   BUN 27 11/15/2023 0841   CREATININE 0.49 11/16/2023 0941   CALCIUM 9.3 11/16/2023 0941   PROT 6.4 04/12/2023 1343   ALBUMIN 4.2 04/12/2023 1343   AST 16 04/12/2023 1343   ALT 18 04/12/2023 1343   ALKPHOS 49 04/12/2023 1343   BILITOT <0.2 04/12/2023 1343   GFRNONAA >60 11/16/2023 0941   GFRAA 111 08/13/2018 1001   Lab Results  Component Value Date   CHOL 207 (H) 06/17/2022   HDL 68 06/17/2022   LDLCALC 114 (H) 06/17/2022   TRIG 145 06/17/2022   CHOLHDL 3.0 06/17/2022   Lab Results  Component Value Date   HGBA1C 5.6 11/10/2022   Lab Results  Component Value Date   VITAMINB12 >2000 (H) 11/10/2022   Lab Results  Component Value Date   TSH 4.830 (H) 11/15/2023    PHYSICAL EXAM:  Today's Vitals   01/18/24 1416  BP: (!) 104/57  Pulse: 61  Weight: 142 lb (64.4 kg)  Height: 5' 4.5" (1.638 m)   Body mass index is 24 kg/m.   Wt Readings from Last 3 Encounters:  01/18/24 142 lb (64.4 kg)  11/16/23 142 lb (64.4 kg)  11/14/23 144 lb 6.4 oz (65.5 kg)     Ht Readings from Last 3 Encounters:  01/18/24 5' 4.5" (1.638 m)  11/16/23 5\' 3"  (1.6 m)  11/14/23 5' 3.6" (1.615 m)      General: The patient is awake, alert and appears not in acute distress.   The patient is well groomed. Head: Normocephalic, atraumatic. Neck is supple. Dental status:  Cardiovascular:  Regular rate and cardiac rhythm by pulse,  without distended neck veins. Respiratory: Lungs are clear to auscultation.  Skin:  Without evidence of ankle edema, or rash. Trunk: The patient's posture is erect.   NEUROLOGIC EXAM: The patient is awake and alert, oriented to place and time.   Memory subjective described as intact.  Attention span & concentration ability appears  normal.  Speech is fluent, low volume -  without  dysarthria, dysphonia .   Cranial nerves: no loss of smell or taste reported  Pupils are  equal and briskly reactive to light. Funduscopic exam no palor , had cataract surgery .  Extraocular movements in vertical and horizontal planes were intact and without nystagmus. No Diplopia. Visual fields by finger perimetry are intact. Hearing was intact to soft voice and finger rubbing.    Facial sensation intact to fine touch.  Facial motor strength is symmetric and tongue and uvula move midline.  Neck ROM : rotation, tilt and flexion extension were normal for age and shoulder shrug was symmetrical.    Motor exam:  Symmetric bulk, tone and ROM.   Normal tone.   Sensory:  Fine touch, pinprick and vibration were intact . Coordination: Rapid alternating movements in the fingers/hands were of normal speed.  The Finger-to-nose maneuver was intact without evidence of ataxia, dysmetria or tremor.   Gait and station: Patient could rise unassisted from a seated position, walked without assistive device.  Stance is of normal width/ base  Toe and heel walk were deferred.  Deep tendon reflexes: in the  upper and lower extremities are symmetric and intact.  Babinski response was deferred .    ASSESSMENT AND PLAN 78 y.o. year old female  here with: Reviewed MRI with the patient.    1) postconcussion syndrome.  July 2024 ws this last TBI   2) she has been dx with hypothyroidism and Bradycardia , Dr Anne Fu.   I have often used Aricept based on anecdotal evidence, but I hesitate in the setting of bradycardia.  I have had symptomatic improvement with Strattera, and will offer that medication for 30 days,  I want her to continue with rehab ad exercises.   I plan to follow up  prn either personally or through our NP within 6 months.   I would like to thank  Alyson Reedy, Fnp [address] [place],  [place] [address] for allowing me to meet with and to take  care of this pleasant patient.   After spending a total time of  35  minutes face to face and additional time for physical and neurologic examination, review of laboratory studies,  personal review of imaging studies, reports and results of other testing and review of referral information / records as far as provided in visit,   Electronically signed by: Melvyn Novas, MD 01/18/2024 2:28 PM  [facility] Board certified by The [employer] of Sleep Medicine and Diplomate of the [employer] of Sleep Medicine. Board certified In Neurology through the ABPN, Fellow of the [employer] of Neu[employer].

## 2024-01-18 NOTE — Patient Instructions (Addendum)
ASSESSMENT AND PLAN 78 y.o. year old female  here with: Reviewed MRI with the patient.    1) postconcussion syndrome.  July 2024 ws this last TBI   2) she has been dx with hypothyroidism and Bradycardia , Dr Anne Fu.   I have often used Aricept based on anecdotal evidence, but I hesitate in the setting of bradycardia.  I have had symptomatic improvement with Strattera, and will offer that medication for 30 days,  I want her to continue with rehab ad exercises.   I plan to follow up  prn either personally or through our NP within 6 months.   I would like to thank  Alyson Reedy, [facility] [address] [place],  [place] [address] for allowing me to meet with and to take care of this pleasant patient. Atomoxetine Capsules What is this medication? ATOMOXETINE (AT oh mox e teen) treats attention-deficit hyperactivity disorder (ADHD). It works by improving focus and reducing impulsive behavior. It belongs to a group of medications called selective norepinephrine reuptake inhibitors. This medicine may be used for other purposes; ask your health care provider or pharmacist if you have questions. COMMON BRAND NAME(S): Strattera What should I tell my care team before I take this medication? They need to know if you have any of these conditions: Glaucoma High or low blood pressure History of stroke Irregular heartbeat or other cardiac disease Liver disease Mania or bipolar disorder Pheochromocytoma Suicidal thoughts, plans, or attempt by you or a family member An unusual or allergic reaction to atomoxetine, other medications, foods, dyes, or preservatives Pregnant or trying to get pregnant Breastfeeding How should I use this medication? Take this medication by mouth with water. Take it as directed on the prescription label at the same time every day. Do not cut, crush, or chew this medication. Swallow the capsules whole. You can take it with or without food. If it upsets your stomach, take it with  food. If you have difficulty sleeping, and you take more than 1 dose per day, take your last dose before 6 PM. Keep taking it unless your care team tells you to stop. A special MedGuide will be given to you by the pharmacist with each prescription and refill. Be sure to read this information carefully each time. Talk to your care team about the use of this medication in children. While it may be prescribed for children as young as 6 years for selected conditions, precautions do apply. Overdosage: If you think you have taken too much of this medicine contact a poison control center or emergency room at once. NOTE: This medicine is only for you. Do not share this medicine with others. What if I miss a dose? If you miss a dose, take it as soon as you can. If it is almost time for your next dose, take only that dose. Do not take double or extra doses. What may interact with this medication? Do not take this medication with any of the following: Cisapride Dronedarone MAOIs, such as Carbex, Eldepryl, Marplan, Nardil, and Parnate Pimozide Reboxetine Thioridazine This medication may also interact with the following: Certain medications for blood pressure, heart disease, irregular heart beat Certain medications for lung disease, such as albuterol Certain medications for mental heath conditions Cold or allergy medications Dofetilide Fluoxetine Medications that increase blood pressure, such as dopamine, dobutamine, or ephedrine Other medications that cause heart rhythm changes Paroxetine Quinidine Stimulant medications for ADHD, weight loss, or staying awake Ziprasidone This list may not describe all possible interactions.  Give your health care provider a list of all the medicines, herbs, non-prescription drugs, or dietary supplements you use. Also tell them if you smoke, drink alcohol, or use illegal drugs. Some items may interact with your medicine. What should I watch for while using this  medication? Visit your care team for regular checks on your progress. It may take a week or more before you see the benefit from this medication. This is why it is very important to continue taking the medication and not miss any doses. If you have been taking this medication regularly for some time, do not suddenly stop taking it. Ask your care team for advice. Rarely, this medication may increase thoughts of suicide or suicide attempts in children and teenagers. Call your child's care team right away if your child or teenager has new or increased thoughts of suicide or has changes in mood or behavior like becoming irritable or anxious. Regularly monitor your child for these behavioral changes. Contact you care team right away if you have an erection that lasts longer than 4 hours or if it becomes painful. This may be a sign of serious problem and must be treated right away to prevent permanent damage. This medication may affect your coordination, reaction time, or judgment. Do not drive or operate machinery until you know how this medication affects you. Sit up or stand slowly to reduce the risk of dizzy or fainting spells. Drinking alcohol with this medication can increase the risk of these side effects. Do not treat yourself for coughs, colds, or allergies without asking your care team for advice. Some ingredients can increase possible side effects. Your mouth may get dry. Chewing sugarless gum or sucking hard candy and drinking plenty of water will help. What side effects may I notice from receiving this medication? Side effects that you should report to your care team as soon as possible: Allergic reactions--skin rash, itching, hives, swelling of the face, lips, tongue, or throat Heart rhythm changes--fast or irregular heartbeat, dizziness, feeling faint or lightheaded, chest pain, trouble breathing Increase in blood pressure Liver injury-- right upper belly pain, loss of appetite, nausea,  light-colored stool, dark yellow or brown urine, yellowing skin or eyes, unusual weakness or fatigue Mood and behavior changes--anxiety, nervousness, confusion, hallucinations, irritability, hostility, thoughts of suicide or self-harm, worsening mood, feelings of depression Painful or prolonged erection Stroke in adults--sudden numbness or weakness of the face, arm, or leg, trouble speaking, confusion, trouble walking, loss of balance or coordination, dizziness, severe headache, change in vision Trouble passing urine Side effects that usually do not require medical attention (report to your care team if they continue or are bothersome): Change in sex drive or performance Constipation Dizziness Dry mouth Loss of appetite Nausea Stomach pain Vomiting This list may not describe all possible side effects. Call your doctor for medical advice about side effects. You may report side effects to FDA at 1-800-FDA-1088. Where should I keep my medication? Keep out of the reach of children and pets. Store at room temperature between 15 and 30 degrees C (59 and 86 degrees F). Throw away any unused medication after the expiration date. NOTE: This sheet is a summary. It may not cover all possible information. If you have questions about this medicine, talk to your doctor, pharmacist, or health care provider.  2024 Elsevier/Gold Standard (2023-11-24 00:00:00)  Post-Concussion Syndrome  A concussion is a brain injury from a direct hit to the head or body. This hit causes the brain to shake  back and forth fast inside the skull. The shaking can damage brain cells and cause chemical changes in the brain. Concussions are normally not life-threatening but can cause serious symptoms. Post-concussion syndrome is when symptoms that happen after a concussion last longer than normal. These symptoms can last from weeks to months. What are the causes? The cause of this condition is not known. It can happen whether your  head injury was mild or severe. What increases the risk? You are more likely to get this condition if: You are female. You are a child, teen, or young adult. You have had a head injury before. You have a history of headaches. You have depression or anxiety. You have more than one symptom or severe symptoms when your concussion occurs. You faint or cannot remember the event (have amnesia of the event). What are the signs or symptoms? Symptoms of this condition include: Physical symptoms, such as: Headaches. Tiredness. Dizziness and weakness. Blurred vision and sensitivity to light. Trouble hearing. Problems with balance. Mental and emotional symptoms, such as: Memory problems and trouble focusing. Trouble falling asleep or staying asleep (insomnia). Feeling irritable. Anxiety or depression. Trouble learning new things. How is this diagnosed? This condition may be diagnosed based on: Your symptoms. A description of your injury. Your medical history. Testing your strength, balance, and nerve function (neurological exam). Your health care provider may order other tests. These may include: Brain imaging, such as a CT scan or an MRI. Memory testing (neuropsychological testing). How is this treated? Treatment for this condition may depend on your symptoms. Symptoms normally go away on their own with time. If you need treatment, it may include: Medicines for headaches, anxiety, depression, and insomnia. Resting your brain and body for a few days after your injury. Rehab therapy, such as: Physical or occupational therapy. This may include exercises to help with balance and dizziness. Mental health counseling. A form of talk therapy called cognitive behavioral therapy (CBT) can be especially helpful. This therapy helps you set goals and follow up on the changes that you make. Speech therapy. Vision therapy. A brain and eye specialist can recommend treatments for vision  problems. Follow these instructions at home: Medicines Take over-the-counter and prescription medicines only as told by your health care provider. Avoid opioid prescription pain medicines when getting over a concussion. Activity Limit your mental activities for the first few days after your injury. This may include not doing these things: Homework or work for your job. Complex thinking. Watching TV. Using a computer or phone. Playing memory games and puzzles. Slowly return to your normal activity level. If a certain activity brings on your symptoms, stop or slow down until you can do the activity without getting symptoms again. Limit physical activity, such as sports or strenuous activities, for the first few days after a concussion. Slowly return to normal activity as told by your health care provider. Light exercise may be helpful. Rest helps your brain heal. Make sure you: Get plenty of sleep at night. Most adults should get at least 7-9 hours of sleep each night. Rest during the day. Take naps or rest breaks when you feel tired. Do not do high-risk activities that could cause a second concussion, such as riding a bike or playing sports. Having another concussion before the first one has healed can be harmful. General instructions  Do not drink alcohol until your health care provider says that you can. Keep track of how severe your symptoms are and how often they  happen. Give this information to your health care provider. Keep all follow-up visits. Your health care provider may need to check you for new or serious symptoms. Where to find more information [employer]: concussionfoundation.org Contact a health care provider if: Your symptoms do not improve. You get injured again. You have unusual behavior changes. Get help right away if: You have a severe or worsening headache. You have weakness or numbness in any part of your body. You vomit repeatedly. You have  mental status changes, such as: Confusion. Trouble speaking. Trouble staying awake. Fainting. You have a seizure. These symptoms may be an emergency. Get help right away. Call 911. Do not wait to see if the symptoms will go away. Do not drive yourself to the hospital. Also, get help right away if: You think about hurting yourself or others. Take one of these steps if you feel like you may hurt yourself or others, or have thoughts about taking your own life: Go to your nearest emergency room. Call 911. Call the National Suicide Prevention Lifeline at (579)878-9773 or 988. This is open 24 hours a day. Text the Crisis Text Line at 775-705-1811. This information is not intended to replace advice given to you by your health care provider. Make sure you discuss any questions you have with your health care provider. Document Revised: 05/06/2022 Document Reviewed: 05/06/2022 Elsevier Patient Education  2024 [employer].

## 2024-01-29 ENCOUNTER — Other Ambulatory Visit (HOSPITAL_BASED_OUTPATIENT_CLINIC_OR_DEPARTMENT_OTHER): Payer: Self-pay

## 2024-01-29 MED ORDER — ONDANSETRON 4 MG PO TBDP
4.0000 mg | ORAL_TABLET | Freq: Four times a day (QID) | ORAL | 0 refills | Status: DC | PRN
  Filled 2024-01-29: qty 12, 3d supply, fill #0

## 2024-01-29 MED ORDER — PROGESTERONE MICRONIZED 100 MG PO CAPS
100.0000 mg | ORAL_CAPSULE | Freq: Every day | ORAL | 1 refills | Status: DC
  Filled 2024-01-29: qty 90, 90d supply, fill #0
  Filled 2024-01-31: qty 90, 45d supply, fill #0
  Filled 2024-03-11: qty 90, 45d supply, fill #1

## 2024-01-30 ENCOUNTER — Other Ambulatory Visit (HOSPITAL_BASED_OUTPATIENT_CLINIC_OR_DEPARTMENT_OTHER): Payer: Self-pay

## 2024-01-30 DIAGNOSIS — R69 Illness, unspecified: Secondary | ICD-10-CM | POA: Diagnosis not present

## 2024-01-30 MED ORDER — CLINDAMYCIN HCL 300 MG PO CAPS
300.0000 mg | ORAL_CAPSULE | Freq: Three times a day (TID) | ORAL | 0 refills | Status: DC
  Filled 2024-01-30: qty 15, 5d supply, fill #0

## 2024-01-30 MED ORDER — OXYCODONE HCL 5 MG PO TABS
5.0000 mg | ORAL_TABLET | ORAL | 0 refills | Status: DC | PRN
  Filled 2024-01-30: qty 15, 3d supply, fill #0

## 2024-01-31 ENCOUNTER — Other Ambulatory Visit (HOSPITAL_BASED_OUTPATIENT_CLINIC_OR_DEPARTMENT_OTHER): Payer: Self-pay

## 2024-01-31 ENCOUNTER — Other Ambulatory Visit (HOSPITAL_COMMUNITY): Payer: Self-pay

## 2024-02-01 ENCOUNTER — Other Ambulatory Visit (HOSPITAL_BASED_OUTPATIENT_CLINIC_OR_DEPARTMENT_OTHER): Payer: Self-pay

## 2024-02-01 ENCOUNTER — Encounter: Payer: Self-pay | Admitting: Physical Therapy

## 2024-02-01 DIAGNOSIS — M1812 Unilateral primary osteoarthritis of first carpometacarpal joint, left hand: Secondary | ICD-10-CM | POA: Diagnosis not present

## 2024-02-01 DIAGNOSIS — H04123 Dry eye syndrome of bilateral lacrimal glands: Secondary | ICD-10-CM | POA: Diagnosis not present

## 2024-02-01 MED ORDER — XIIDRA 5 % OP SOLN
1.0000 [drp] | Freq: Two times a day (BID) | OPHTHALMIC | 4 refills | Status: DC
  Filled 2024-02-01: qty 60, 30d supply, fill #0
  Filled 2024-02-28 – 2024-04-24 (×2): qty 60, 30d supply, fill #1

## 2024-02-02 ENCOUNTER — Other Ambulatory Visit: Payer: Self-pay

## 2024-02-03 ENCOUNTER — Other Ambulatory Visit (HOSPITAL_BASED_OUTPATIENT_CLINIC_OR_DEPARTMENT_OTHER): Payer: Self-pay

## 2024-02-05 ENCOUNTER — Other Ambulatory Visit (HOSPITAL_BASED_OUTPATIENT_CLINIC_OR_DEPARTMENT_OTHER): Payer: Self-pay

## 2024-02-05 MED ORDER — CAPVAXIVE 0.5 ML IM SOSY
0.5000 mL | PREFILLED_SYRINGE | Freq: Once | INTRAMUSCULAR | 0 refills | Status: AC
  Filled 2024-02-05: qty 0.5, 1d supply, fill #0

## 2024-02-06 ENCOUNTER — Other Ambulatory Visit (HOSPITAL_BASED_OUTPATIENT_CLINIC_OR_DEPARTMENT_OTHER): Payer: Self-pay

## 2024-02-08 ENCOUNTER — Other Ambulatory Visit: Payer: Self-pay

## 2024-02-08 ENCOUNTER — Ambulatory Visit: Payer: Medicare Other | Attending: Urology

## 2024-02-08 ENCOUNTER — Other Ambulatory Visit (HOSPITAL_BASED_OUTPATIENT_CLINIC_OR_DEPARTMENT_OTHER): Payer: Self-pay | Admitting: Family Medicine

## 2024-02-08 ENCOUNTER — Other Ambulatory Visit (HOSPITAL_BASED_OUTPATIENT_CLINIC_OR_DEPARTMENT_OTHER): Payer: Self-pay

## 2024-02-08 DIAGNOSIS — R293 Abnormal posture: Secondary | ICD-10-CM | POA: Diagnosis not present

## 2024-02-08 DIAGNOSIS — R279 Unspecified lack of coordination: Secondary | ICD-10-CM | POA: Insufficient documentation

## 2024-02-08 DIAGNOSIS — E063 Autoimmune thyroiditis: Secondary | ICD-10-CM

## 2024-02-08 DIAGNOSIS — M6281 Muscle weakness (generalized): Secondary | ICD-10-CM | POA: Diagnosis not present

## 2024-02-08 NOTE — Therapy (Signed)
OUTPATIENT PHYSICAL THERAPY FEMALE PELVIC EVALUATION   Patient Name: Wendy Joyce MRN: [id number] DOB:April 19, 1946, 78 y.o., female Today's Date: 02/08/2024  END OF SESSION:  PT End of Session - 02/08/24 1145     Visit Number 1    Date for PT Re-Evaluation 05/02/24    Authorization Type BC[employer] Medicare    PT Start Time 1145    PT Stop Time 1225    PT Time Calculation (min) 40 min    Activity Tolerance Patient tolerated treatment well    Behavior During Therapy [facility] for tasks assessed/performed             Past Medical History:  Diagnosis Date   Abdominal pain, generalized 01/29/2015   Abnormal thyroid function test 02/25/2016   ADHD (attention deficit hyperactivity disorder)    Allergic rhinitis, unspecified 01/29/2015   Allergy    Anemia    DOE   Ankle sprain 03/16/2016   Anxiety 01/29/2015   Arthritis    hands   Chronic kidney disease 2012   kidney stones   Constipation 01/29/2015   Decreased libido 05/02/2018   Disorder of the skin and subcutaneous tissue, unspecified 01/29/2015   DJD (degenerative joint disease)    hand   Dysthymia    Endometriosis    External otitis    Family history of coronary artery disease 05/02/2018   Fatigue due to excessive exertion 02/24/2016   Fever blister    Fibromyalgia 01/29/2015   Frequency of micturition 01/29/2015   Functional dyspepsia 01/29/2015   Gastro-esophageal reflux disease without esophagitis 01/29/2015   Headache(784.0)    High cholesterol    Hyperlipidemia 09/12/2016   Hypersomnia    Hypothyroidism    IBS (irritable bowel syndrome) 05/02/2018   Insomnia    Internal hemorrhoids    Irritable 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 01/29/2015   Labial pain 11/30/2016   Lactose intolerance 12/14/2016   Lactose intolerance in adult 09/12/2016   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   Left ureteral stone    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   Major depressive disorder, single episode, unspecified    Migraine  with aura, not intractable, without status migrainosus    Mixed hyperlipidemia    Mouth pain 01/07/2016   Osteoarthritis of hip    Osteopenia determined by x-ray 03/02/2017   Personal history of estrogen therapy    Phlebitis and thrombophlebitis of the leg    Pityriasis alba 01/29/2015   Pure hypercholesterolemia 06/29/2016   Rectum pain 09/12/2016   Recurrent sinus infections 02/24/2016   Rhinosinusitis 01/07/2016   Rosacea    Sinusitis 12/24/2015   Tinnitus of left ear 01/29/2015   Umbilical hernia without obstruction and without gangrene 01/29/2015   Umbilical pain 05/02/2018   Unspecified urinary incontinence 01/07/2016   Urticaria    UTI (urinary tract infection)    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   Vitamin D deficiency    Past Surgical History:  Procedure Laterality Date   BREAST ENHANCEMENT SURGERY     BREAST REDUCTION SURGERY Bilateral 2018   COLONOSCOPY     COLONOSCOPY WITH PROPOFOL N/A 02/25/2013   Procedure: COLONOSCOPY WITH PROPOFOL;  Surgeon: Charolett Bumpers, MD;  Location: [facility];  Service: Endoscopy;  Laterality: N/A;   left hand surgery  12/28/2022   mini facelift  2000   NASAL SINUS SURGERY     REDUCTION MAMMAPLASTY     ROTATOR CUFF REPAIR     Right   ROTATOR CUFF REPAIR  Left   SEPTOPLASTY WITH ETHMOIDECTOMY, AND MAXILLARY ANTROSTOMY Bilateral 2006   TOTAL ABDOMINAL HYSTERECTOMY     ovaries remain   TUBAL LIGATION     umbicial hernia     Patient Active Problem List   Diagnosis Date Noted   Post concussive syndrome 01/18/2024   Bradycardia, sinus 11/15/2023   Primary insomnia 11/15/2023   Right lateral abdominal pain 05/17/2023   RUQ abdominal pain 04/12/2023   Depression, recurrent (HCC) 12/25/2022   Preoperative clearance 11/23/2022   Encounter for counseling 10/14/2022   Influenza 09/18/2022   Pain of left hand 06/29/2022   Vasomotor symptoms due to menopause 05/30/2022   Pre-diabetes  05/30/2022   Anxiety disorder 03/17/2022   Allergic rhinitis 03/17/2022   Constipation 03/17/2022   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   Exertional dyspnea 03/17/2022   Family history of coronary artery disease 03/17/2022   Weakness with dizziness 03/17/2022   Fever blister 03/17/2022   Frequency of micturition 03/17/2022   Irritable bowel syndrome 03/17/2022   Lactose intolerance 03/17/2022   Left anterior fascicular block 03/17/2022   Lichen planus 03/17/2022   Panniculitis 03/17/2022   Refractory migraine with aura 03/17/2022   Statin not tolerated 03/17/2022   Thrombophlebitis of superficial veins of lower extremity 03/17/2022   Bilateral tinnitus 03/17/2022   Umbilical hernia 03/17/2022   Urinary incontinence 03/17/2022   Urticaria due to drug allergy 03/17/2022   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 03/17/2022   Sexual dysfunction 03/17/2022   Imbalance 03/17/2022   Fibromyalgia 09/21/2021   Hyperlipidemia 09/21/2021   Hashimoto's thyroiditis 03/31/2021   Hypothyroidism 09/27/2013    PCP: Alyson Reedy, FNP  REFERRING PROVIDER: Lolita Patella, MD  REFERRING DIAG: R39.15 (ICD-10-CM) - Urgency of urination N39.3 (ICD-10-CM) - Stress incontinence (female) (female)  THERAPY DIAG:  Muscle weakness (generalized)  Unspecified lack of coordination  Abnormal posture  Rationale for Evaluation and Treatment: Rehabilitation  ONSET DATE: 09/19/23  SUBJECTIVE:                                                                                                                                                                                           SUBJECTIVE STATEMENT: Pt states that she had bladder sling in December. She feels like bladder is better, but she is still having a lot of difficulty.  Fluid intake: not enough;   PAIN:  Are you having pain? No   PRECAUTIONS: None  RED FLAGS: None   WEIGHT BEARING RESTRICTIONS: No  FALLS:  Has  patient fallen in last 6 months? No  OCCUPATION: retired; working as an Tree surgeon  ACTIVITY LEVEL : works out with Psychologist, educational 1x/week;  treadmill daily; stretching   PLOF: Independent  PATIENT GOALS: stop wearing panty liner underwear  PERTINENT HISTORY:  Abdominal hysterectomy, tubal ligation; umbilical hernia, endometriosis, fibromyalgia, GERD, IBS, labial pain, lichens planus, anxiety/depression, fissures  BOWEL MOVEMENT: Pain with bowel movement: No Type of bowel movement:1x/day Fully empty rectum: Yes: - Leakage: No Pads: No Fiber supplement/laative No  URINATION: Pain with urination: No Fully empty bladder: No Stream: Weak Urgency: Yes: only if she has waited too long, but much better since surgery  Frequency: every hour when active; nocturia 1x/night with slow emptying Leakage: Coughing, Sneezing, Laughing, Exercise, and fast walking; sit>stand Pads: Yes: panty liner underwear - would like to get rid of   INTERCOURSE:  Ability to have vaginal penetration Yes.   Pain with intercourse:  discomfort DrynessYes; not using lubricant Climax: WNL Marinoff Scale: 1/3  PREGNANCY: Vaginal deliveries 2 Tearing Yes: - Episiotomy No C-section deliveries 0 Currently pregnant No  PROLAPSE: None   OBJECTIVE:  Note: Objective measures were completed at Evaluation unless otherwise noted. 02/08/24  COGNITION: Overall cognitive status: Within functional limits for tasks assessed     SENSATION: Light touch: Appears intact   FUNCTIONAL TESTS:  Squat: WNL - some wobbling Single leg stance:  Rt: pelvic drop  Lt: pelvic drop Curl-up test: no diastasis; lower abdominal distortion   GAIT: Assistive device utilized: None Comments: WNL  POSTURE: rounded shoulders, forward head, decreased lumbar lordosis, decreased thoracic kyphosis, posterior pelvic tilt, and right pelvic obliquity   LUMBARAROM/PROM:  A/PROM A/PROM  Eval (% available)  Flexion 50  Extension 25  Right  lateral flexion 50  Left lateral flexion 50  Right rotation 50  Left rotation 50   (Blank rows = not tested)  PALPATION:   General: restriction throughout thoracic and lumbar spine  Pelvic Alignment: Rt posterior rotation  Abdominal: scar tissue restriction; tightness in upper right quadrant with tenderness; decreased lower rib cage mobility                External Perineal Exam: some increase in redness                             Internal Pelvic Floor: tenderness at posterior fourchette  Patient confirms identification and approves PT to assess internal pelvic floor and treatment Yes  PELVIC MMT:   MMT eval  Vaginal 1-2/5, 2 second hold, 6 repeat contractions, but bearing down instead of active contraction; very hard time with consistent coordination  Diastasis Recti No diastasis; some lower abdominal distortion  (Blank rows = not tested)        TONE: low  PROLAPSE: WNL in supine  TODAY'S TREATMENT:                                                                                                                              DATE:  02/08/24  EVAL  Neuromuscular re-education: Pt provides verbal consent for  internal vaginal/rectal pelvic floor exam. Internal vaginal pelvic floor muscle contraction training Quick flicks Long holds  Urge drill The knack  PATIENT EDUCATION:  Education details: See above  Person educated: Patient Education method: Explanation, Demonstration, Tactile cues, Verbal cues, and Handouts Education comprehension: verbalized understanding  HOME EXERCISE PROGRAM: [id number]  ASSESSMENT:  CLINICAL IMPRESSION: Patient is a 78 y.o. female who was seen today for physical therapy evaluation and treatment for urinary incontinence. Exam findings notable for abnormal posture, decreased lumbar A/ROM, pelvic instability in single leg stance, abdominal scar tissue restriction, decreased rib cage mobility, abdominal tenderness, pelvic floor muscle weakness,  decreased pelvic floor muscle endurance, and poor coordination of pelvic floor muscle contraction. Signs and symptoms are most consistent with pelvic floor muscle weakness and poor abdominal pressure management. Initial treatment consisted of pelvic floor muscle contraction training, the knack, and urge drill. She will continue to benefit from skilled PT intervention in order to decrease urinary incontinence and urgency, improve pressure management, and begin/progress functional strengthening program.   OBJECTIVE IMPAIRMENTS: decreased activity tolerance, decreased coordination, decreased endurance, decreased mobility, decreased strength, increased fascial restrictions, increased muscle spasms, impaired tone, postural dysfunction, and pain.   ACTIVITY LIMITATIONS: continence  PARTICIPATION LIMITATIONS: community activity  PERSONAL FACTORS: 1-2 comorbidities: medical history  are also affecting patient's functional outcome.   REHAB POTENTIAL: Good  CLINICAL DECISION MAKING: Evolving/moderate complexity  EVALUATION COMPLEXITY: Moderate   GOALS: Goals reviewed with patient? Yes  SHORT TERM GOALS: Target date: 03/07/2024   Pt will be independent with HEP.   Baseline: Goal status: INITIAL  2.  Pt will be independent with the knack, urge suppression technique, and double voiding in order to improve bladder habits and decrease urinary incontinence.   Baseline:  Goal status: INITIAL  3.  Pt will be able to correctly perform diaphragmatic breathing and appropriate pressure management in order to prevent worsening vaginal wall laxity and improve pelvic floor A/ROM.   Baseline:  Goal status: INITIAL  4.  Pt will report 25% improvement in urinary incontinence.  Baseline:  Goal status: INITIAL  5.  Pt will improve pelvic floor muscle strength to consistently 2/5 with appropriate coordination.  Baseline:  Goal status: INITIAL   LONG TERM GOALS: Target date: 05/02/24  Pt will be  independent with advanced HEP.   Baseline:  Goal status: INITIAL  2.  Pt will report 75% improvement in urinary incontinence.  Baseline:  Goal status: INITIAL  3.  Pt will demonstrate consistent 3/5 pelvic floor muscle strength with appropriate coordination.  Baseline:  Goal status: INITIAL  4.  Pt will report no leaks with laughing, coughing, sneezing in order to improve comfort with interpersonal relationships and community activities.   Baseline:  Goal status: INITIAL  5.  Pt will feel confident not using panty liner underwear.  Baseline:  Goal status: INITIAL  6.  Pt will increase pelvic floor muscle endurance to greater than 10 seconds.  Baseline:  Goal status: INITIAL  PLAN:  PT FREQUENCY: 1-2x/week  PT DURATION: 12 weeks  PLANNED INTERVENTIONS: 97110-Therapeutic exercises, 97530- Therapeutic activity, 97112- Neuromuscular re-education, 97535- Self Care, 54098- Manual therapy, Dry Needling, and Biofeedback  PLAN FOR NEXT SESSION: Begin core training; mobility activities.    Julio Alm, PT, DPT02/13/251:01 PM

## 2024-02-08 NOTE — Patient Instructions (Signed)
The knack: Use this technique while coughing, laughing, sneezing, or with any activities that causes you to leak urine a little. Right before you perform one of these activities that increase pressure in the abdomen and pushes a little urine out, perform a pelvic floor muscle contraction and hold. If that does not completely stop the leaking, try tightening your thighs together in addition to performing a pelvic floor muscle contraction. Make sure you are not trying to stifle a cough, sneeze, or laugh; allow these activities in full as it will cause less pressure down into the bladder and pelvic floor muscles.   Urge Incontinence  Ideal urination frequency is every 2-4 wakeful hours, which equates to 5-8 times within a 24-hour period.   Urge incontinence is leakage that occurs when the bladder muscle contracts, creating a sudden need to go before getting to the bathroom.   Going too often when your bladder isn't actually full can disrupt the body's automatic signals to store and hold urine longer, which will increase urgency/frequency.  In this case, the bladder "is running the show" and strategies can be learned to retrain this pattern.   One should be able to control the first urge to urinate, at around .  The bladder can hold up to a "grande latte," or . To help you gain control, practice the Urge Drill below when urgency strikes.  This drill will help retrain your bladder signals and allow you to store and hold urine longer.  The overall goal is to stretch out your time between voids to reach a more manageable voiding schedule.    Practice your "quick flicks" often throughout the day (each waking hour) even when you don't need feel the urge to go.  This will help strengthen your pelvic floor muscles, making them more effective in controlling leakage.  Urge Drill  When you feel an urge to go, follow these steps to regain control: Stop what you are doing and be still Take one  deep breath, directing your air into your abdomen Think an affirming thought, such as "I've got this." Do 5 quick flicks of your pelvic floor Walk with control to the bathroom to void, or delay voiding     [facility] [address], [address] [place], [place] [address] Phone # [phone] Fax [phone]

## 2024-02-09 ENCOUNTER — Other Ambulatory Visit (HOSPITAL_BASED_OUTPATIENT_CLINIC_OR_DEPARTMENT_OTHER): Payer: Self-pay

## 2024-02-12 ENCOUNTER — Other Ambulatory Visit: Payer: Self-pay | Admitting: Family Medicine

## 2024-02-12 ENCOUNTER — Other Ambulatory Visit (HOSPITAL_BASED_OUTPATIENT_CLINIC_OR_DEPARTMENT_OTHER): Payer: Self-pay

## 2024-02-12 ENCOUNTER — Other Ambulatory Visit (HOSPITAL_BASED_OUTPATIENT_CLINIC_OR_DEPARTMENT_OTHER): Payer: Self-pay | Admitting: Family Medicine

## 2024-02-12 ENCOUNTER — Other Ambulatory Visit: Payer: Self-pay

## 2024-02-12 DIAGNOSIS — E063 Autoimmune thyroiditis: Secondary | ICD-10-CM

## 2024-02-12 MED ORDER — LEVOTHYROXINE SODIUM 125 MCG PO TABS
125.0000 ug | ORAL_TABLET | Freq: Every day | ORAL | 2 refills | Status: DC
  Filled 2024-02-12: qty 30, 30d supply, fill #0
  Filled 2024-03-06: qty 30, 30d supply, fill #1
  Filled 2024-04-05: qty 30, 30d supply, fill #2

## 2024-02-12 NOTE — Telephone Encounter (Signed)
Copied from CRM [id number]. Topic: Clinical - Medication Refill >> Feb 12, 2024 11:22 AM Gery Pray wrote: Most Recent Primary Care Visit:  Provider: Alyson Reedy  Department: [facility]  Visit Type: OFFICE VISIT  Date: 11/14/2023  Medication: levothyroxine (SYNTHROID) 125 MCG tablet  Has the patient contacted their pharmacy? Yes (Agent: If no, request that the patient contact the pharmacy for the refill. If patient does not wish to contact the pharmacy document the reason why and proceed with request.) (Agent: If yes, when and what did the pharmacy advise?)  Is this the correct pharmacy for this prescription? Yes If no, delete pharmacy and type the correct one.  This is the patient's preferred pharmacy:  [facility] [address] [place] [address] Phone: [phone] Fax: [phone]   Has the prescription been filled recently? No  Is the patient out of the medication? Yes  Has the patient been seen for an appointment in the last year OR does the patient have an upcoming appointment? Yes  Can we respond through MyChart? Yes  Agent: Please be advised that Rx refills may take up to 3 business days. We ask that you follow-up with your pharmacy.

## 2024-02-13 ENCOUNTER — Other Ambulatory Visit (HOSPITAL_BASED_OUTPATIENT_CLINIC_OR_DEPARTMENT_OTHER): Payer: Self-pay

## 2024-02-13 DIAGNOSIS — R69 Illness, unspecified: Secondary | ICD-10-CM | POA: Diagnosis not present

## 2024-02-28 ENCOUNTER — Other Ambulatory Visit (HOSPITAL_BASED_OUTPATIENT_CLINIC_OR_DEPARTMENT_OTHER): Payer: Self-pay

## 2024-02-29 ENCOUNTER — Other Ambulatory Visit: Payer: Self-pay

## 2024-03-02 ENCOUNTER — Other Ambulatory Visit (HOSPITAL_BASED_OUTPATIENT_CLINIC_OR_DEPARTMENT_OTHER): Payer: Self-pay

## 2024-03-05 ENCOUNTER — Encounter (HOSPITAL_BASED_OUTPATIENT_CLINIC_OR_DEPARTMENT_OTHER): Payer: BC Managed Care – PPO

## 2024-03-05 DIAGNOSIS — R69 Illness, unspecified: Secondary | ICD-10-CM | POA: Diagnosis not present

## 2024-03-06 ENCOUNTER — Other Ambulatory Visit (HOSPITAL_BASED_OUTPATIENT_CLINIC_OR_DEPARTMENT_OTHER): Payer: Self-pay

## 2024-03-06 MED ORDER — CYCLOSPORINE 0.05 % OP EMUL
1.0000 [drp] | Freq: Two times a day (BID) | OPHTHALMIC | 4 refills | Status: DC
  Filled 2024-03-06: qty 60, 30d supply, fill #0
  Filled 2024-03-12 – 2024-04-24 (×3): qty 180, 90d supply, fill #0

## 2024-03-08 DIAGNOSIS — R69 Illness, unspecified: Secondary | ICD-10-CM | POA: Diagnosis not present

## 2024-03-11 ENCOUNTER — Other Ambulatory Visit (HOSPITAL_BASED_OUTPATIENT_CLINIC_OR_DEPARTMENT_OTHER): Payer: Self-pay

## 2024-03-12 ENCOUNTER — Ambulatory Visit (HOSPITAL_BASED_OUTPATIENT_CLINIC_OR_DEPARTMENT_OTHER): Payer: Medicare Other | Admitting: Family Medicine

## 2024-03-12 ENCOUNTER — Other Ambulatory Visit (HOSPITAL_BASED_OUTPATIENT_CLINIC_OR_DEPARTMENT_OTHER): Payer: Self-pay

## 2024-03-12 DIAGNOSIS — M1812 Unilateral primary osteoarthritis of first carpometacarpal joint, left hand: Secondary | ICD-10-CM | POA: Diagnosis not present

## 2024-03-13 DIAGNOSIS — R69 Illness, unspecified: Secondary | ICD-10-CM | POA: Diagnosis not present

## 2024-03-14 ENCOUNTER — Other Ambulatory Visit (HOSPITAL_BASED_OUTPATIENT_CLINIC_OR_DEPARTMENT_OTHER): Payer: Self-pay

## 2024-03-15 ENCOUNTER — Other Ambulatory Visit: Payer: Self-pay

## 2024-03-15 ENCOUNTER — Other Ambulatory Visit (HOSPITAL_BASED_OUTPATIENT_CLINIC_OR_DEPARTMENT_OTHER): Payer: Self-pay

## 2024-03-18 DIAGNOSIS — G8929 Other chronic pain: Secondary | ICD-10-CM | POA: Diagnosis not present

## 2024-03-18 DIAGNOSIS — R29898 Other symptoms and signs involving the musculoskeletal system: Secondary | ICD-10-CM | POA: Diagnosis not present

## 2024-03-18 DIAGNOSIS — M79645 Pain in left finger(s): Secondary | ICD-10-CM | POA: Diagnosis not present

## 2024-03-19 ENCOUNTER — Ambulatory Visit: Payer: Self-pay | Attending: Urology

## 2024-03-19 DIAGNOSIS — R293 Abnormal posture: Secondary | ICD-10-CM | POA: Diagnosis not present

## 2024-03-19 DIAGNOSIS — M6281 Muscle weakness (generalized): Secondary | ICD-10-CM

## 2024-03-19 DIAGNOSIS — R279 Unspecified lack of coordination: Secondary | ICD-10-CM

## 2024-03-19 NOTE — Therapy (Signed)
 OUTPATIENT PHYSICAL THERAPY FEMALE PELVIC TREATMENT   Patient Name: Wendy Joyce MRN: [id number] DOB:[birth year], 78 y.o., female Today's Date: 03/19/2024  END OF SESSION:  PT End of Session - 03/19/24 1237     Visit Number 2    Date for PT Re-Evaluation 05/02/24    Authorization Type [employer] Medicare    Progress Note Due on Visit 10    PT Start Time 1237    PT Stop Time 1315    PT Time Calculation (min) 38 min    Activity Tolerance Patient tolerated treatment well    Behavior During Therapy [facility] for tasks assessed/performed              Past Medical History:  Diagnosis Date   Abdominal pain, generalized 01/29/2015   Abnormal thyroid function test 02/25/2016   ADHD (attention deficit hyperactivity disorder)    Allergic rhinitis, unspecified 01/29/2015   Allergy    Anemia    DOE   Ankle sprain 03/16/2016   Anxiety 01/29/2015   Arthritis    hands   Chronic kidney disease 2012   kidney stones   Constipation 01/29/2015   Decreased libido 05/02/2018   Disorder of the skin and subcutaneous tissue, unspecified 01/29/2015   DJD (degenerative joint disease)    hand   Dysthymia    Endometriosis    External otitis    Family history of coronary artery disease 05/02/2018   Fatigue due to excessive exertion 02/24/2016   Fever blister    Fibromyalgia 01/29/2015   Frequency of micturition 01/29/2015   Functional dyspepsia 01/29/2015   Gastro-esophageal reflux disease without esophagitis 01/29/2015   Headache(784.0)    High cholesterol    Hyperlipidemia 09/12/2016   Hypersomnia    Hypothyroidism    IBS (irritable bowel syndrome) 05/02/2018   Insomnia    Internal hemorrhoids    Irritable 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 01/29/2015   Labial pain 11/30/2016   Lactose intolerance 12/14/2016   Lactose intolerance in adult 09/12/2016   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   Left ureteral stone    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   Major depressive disorder, single  episode, unspecified    Migraine with aura, not intractable, without status migrainosus    Mixed hyperlipidemia    Mouth pain 01/07/2016   Osteoarthritis of hip    Osteopenia determined by x-ray 03/02/2017   Personal history of estrogen therapy    Phlebitis and thrombophlebitis of the leg    Pityriasis alba 01/29/2015   Pure hypercholesterolemia 06/29/2016   Rectum pain 09/12/2016   Recurrent sinus infections 02/24/2016   Rhinosinusitis 01/07/2016   Rosacea    Sinusitis 12/24/2015   Tinnitus of left ear 01/29/2015   Umbilical hernia without obstruction and without gangrene 01/29/2015   Umbilical pain 05/02/2018   Unspecified urinary incontinence 01/07/2016   Urticaria    UTI (urinary tract infection)    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   Vitamin D deficiency    Past Surgical History:  Procedure Laterality Date   BREAST ENHANCEMENT SURGERY     BREAST REDUCTION SURGERY Bilateral 2018   COLONOSCOPY     COLONOSCOPY WITH PROPOFOL N/A 02/25/2013   Procedure: COLONOSCOPY WITH PROPOFOL;  Surgeon: Charolett Bumpers, MD;  Location: [facility];  Service: Endoscopy;  Laterality: N/A;   left hand surgery  12/28/2022   mini facelift  2000   NASAL SINUS SURGERY     REDUCTION MAMMAPLASTY     ROTATOR CUFF REPAIR  Right   ROTATOR CUFF REPAIR     Left   SEPTOPLASTY WITH ETHMOIDECTOMY, AND MAXILLARY ANTROSTOMY Bilateral 2006   TOTAL ABDOMINAL HYSTERECTOMY     ovaries remain   TUBAL LIGATION     umbicial hernia     Patient Active Problem List   Diagnosis Date Noted   Post concussive syndrome 01/18/2024   Bradycardia, sinus 11/15/2023   Primary insomnia 11/15/2023   Right lateral abdominal pain 05/17/2023   RUQ abdominal pain 04/12/2023   Depression, recurrent (HCC) 12/25/2022   Preoperative clearance 11/23/2022   Encounter for counseling 10/14/2022   Influenza 09/18/2022   Pain of left hand 06/29/2022   Vasomotor symptoms due to  menopause 05/30/2022   Pre-diabetes 05/30/2022   Anxiety disorder 03/17/2022   Allergic rhinitis 03/17/2022   Constipation 03/17/2022   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   Exertional dyspnea 03/17/2022   Family history of coronary artery disease 03/17/2022   Weakness with dizziness 03/17/2022   Fever blister 03/17/2022   Frequency of micturition 03/17/2022   Irritable bowel syndrome 03/17/2022   Lactose intolerance 03/17/2022   Left anterior fascicular block 03/17/2022   Lichen planus 03/17/2022   Panniculitis 03/17/2022   Refractory migraine with aura 03/17/2022   Statin not tolerated 03/17/2022   Thrombophlebitis of superficial veins of lower extremity 03/17/2022   Bilateral tinnitus 03/17/2022   Umbilical hernia 03/17/2022   Urinary incontinence 03/17/2022   Urticaria due to drug allergy 03/17/2022   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 03/17/2022   Sexual dysfunction 03/17/2022   Imbalance 03/17/2022   Fibromyalgia 09/21/2021   Hyperlipidemia 09/21/2021   Hashimoto's thyroiditis 03/31/2021   Hypothyroidism 09/27/2013    PCP: Alyson Reedy, FNP  REFERRING PROVIDER: Lolita Patella, MD  REFERRING DIAG: R39.15 (ICD-10-CM) - Urgency of urination N39.3 (ICD-10-CM) - Stress incontinence (female) (female)  THERAPY DIAG:  Muscle weakness (generalized)  Unspecified lack of coordination  Abnormal posture  Rationale for Evaluation and Treatment: Rehabilitation  ONSET DATE: 09/19/23  SUBJECTIVE:                                                                                                                                                                                           SUBJECTIVE STATEMENT: Pt states that she has not been able to do many of her initial exercises.   PAIN:  Are you having pain? No   PRECAUTIONS: None  RED FLAGS: None   WEIGHT BEARING RESTRICTIONS: No  FALLS:  Has patient fallen in last 6 months?  No  OCCUPATION: retired; working as an Tree surgeon  ACTIVITY LEVEL : works out with Psychologist, educational 1x/week; treadmill daily; stretching  PLOF: Independent  PATIENT GOALS: stop wearing panty liner underwear  PERTINENT HISTORY:  Abdominal hysterectomy, tubal ligation; umbilical hernia, endometriosis, fibromyalgia, GERD, IBS, labial pain, lichens planus, anxiety/depression, fissures  BOWEL MOVEMENT: Pain with bowel movement: No Type of bowel movement:1x/day Fully empty rectum: Yes: - Leakage: No Pads: No Fiber supplement/laative No  URINATION: Pain with urination: No Fully empty bladder: No Stream: Weak Urgency: Yes: only if she has waited too long, but much better since surgery  Frequency: every hour when active; nocturia 1x/night with slow emptying Leakage: Coughing, Sneezing, Laughing, Exercise, and fast walking; sit>stand Pads: Yes: panty liner underwear - would like to get rid of   INTERCOURSE:  Ability to have vaginal penetration Yes.   Pain with intercourse:  discomfort DrynessYes; not using lubricant Climax: WNL Marinoff Scale: 1/3  PREGNANCY: Vaginal deliveries 2 Tearing Yes: - Episiotomy No C-section deliveries 0 Currently pregnant No  PROLAPSE: None   OBJECTIVE:  Note: Objective measures were completed at Evaluation unless otherwise noted. 02/08/24  COGNITION: Overall cognitive status: Within functional limits for tasks assessed     SENSATION: Light touch: Appears intact   FUNCTIONAL TESTS:  Squat: WNL - some wobbling Single leg stance:  Rt: pelvic drop  Lt: pelvic drop Curl-up test: no diastasis; lower abdominal distortion   GAIT: Assistive device utilized: None Comments: WNL  POSTURE: rounded shoulders, forward head, decreased lumbar lordosis, decreased thoracic kyphosis, posterior pelvic tilt, and right pelvic obliquity   LUMBARAROM/PROM:  A/PROM A/PROM  Eval (% available)  Flexion 50  Extension 25  Right lateral flexion 50  Left lateral  flexion 50  Right rotation 50  Left rotation 50   (Blank rows = not tested)  PALPATION:   General: restriction throughout thoracic and lumbar spine  Pelvic Alignment: Rt posterior rotation  Abdominal: scar tissue restriction; tightness in upper right quadrant with tenderness; decreased lower rib cage mobility                External Perineal Exam: some increase in redness                             Internal Pelvic Floor: tenderness at posterior fourchette  Patient confirms identification and approves PT to assess internal pelvic floor and treatment Yes  PELVIC MMT:   MMT eval  Vaginal 1-2/5, 2 second hold, 6 repeat contractions, but bearing down instead of active contraction; very hard time with consistent coordination  Diastasis Recti No diastasis; some lower abdominal distortion  (Blank rows = not tested)        TONE: low  PROLAPSE: WNL in supine  TODAY'S TREATMENT:                                                                                                                              DATE:  03/19/24 Neuromuscular re-education: Review of pelvic floor muscle strengthening HEP and the urge drill and the  knack Transversus abdominus training with multimodal cues for improved motor control and breath coordination Transversus abdominus isometrics 10x Bil supine UE ball press with transversus abdominus and pelvic floor muscle contractions and breath coordination 10x Supine hip adduction ball press with transversus abdominus and pelvic floor muscle contractions and breath coordination 10x Unilateral side lying UE ball press with transversus abdominus and pelvic floor muscle contractions and breath coordination 10x Unilateral side lying UE ball press in clam shell with transversus abdominus and pelvic floor muscle contractions and breath coordination 10x Bridge with hip adduction, transversus abdominus, and pelvic floor muscle 2 x 10 Seated hip adduction ball press with  transversus abdominus and pelvic floor muscle 2 x 10 Seated hip abduction red band with transversus abdominus and pelvic floor muscle 2 x 10 Seated resisted march red band with transversus abdominus and pelvic floor muscle 2 x 10   02/08/24  EVAL  Neuromuscular re-education: Pt provides verbal consent for internal vaginal/rectal pelvic floor exam. Internal vaginal pelvic floor muscle contraction training Quick flicks Long holds  Urge drill The knack  PATIENT EDUCATION:  Education details: See above  Person educated: Patient Education method: Programmer, multimedia, Demonstration, Actor cues, Verbal cues, and Handouts Education comprehension: verbalized understanding  HOME EXERCISE PROGRAM: [id number]  ASSESSMENT:  CLINICAL IMPRESSION: Pt has not seen change with bladder due to being unable to begin HEP. She has had many life stressors occurring, but feels more prepare to begin at this time. She was able to review initial HEP and feel confident with exercises. We started deep core training and she demosntrates very good coordination of transversus abdominus and pelvic floor muscles with breath coordination. She was able to progress through exercises to provide greater challenge and incorporate UE/LE. HEP updated. She will continue to benefit from skilled PT intervention in order to decrease urinary incontinence and urgency, improve pressure management, and begin/progress functional strengthening program.   OBJECTIVE IMPAIRMENTS: decreased activity tolerance, decreased coordination, decreased endurance, decreased mobility, decreased strength, increased fascial restrictions, increased muscle spasms, impaired tone, postural dysfunction, and pain.   ACTIVITY LIMITATIONS: continence  PARTICIPATION LIMITATIONS: community activity  PERSONAL FACTORS: 1-2 comorbidities: medical history  are also affecting patient's functional outcome.   REHAB POTENTIAL: Good  CLINICAL DECISION MAKING:  Evolving/moderate complexity  EVALUATION COMPLEXITY: Moderate   GOALS: Goals reviewed with patient? Yes  SHORT TERM GOALS: Target date: 03/07/2024   Pt will be independent with HEP.   Baseline: Goal status: INITIAL  2.  Pt will be independent with the knack, urge suppression technique, and double voiding in order to improve bladder habits and decrease urinary incontinence.   Baseline:  Goal status: INITIAL  3.  Pt will be able to correctly perform diaphragmatic breathing and appropriate pressure management in order to prevent worsening vaginal wall laxity and improve pelvic floor A/ROM.   Baseline:  Goal status: INITIAL  4.  Pt will report 25% improvement in urinary incontinence.  Baseline:  Goal status: INITIAL  5.  Pt will improve pelvic floor muscle strength to consistently 2/5 with appropriate coordination.  Baseline:  Goal status: INITIAL   LONG TERM GOALS: Target date: 05/02/24  Pt will be independent with advanced HEP.   Baseline:  Goal status: INITIAL  2.  Pt will report 75% improvement in urinary incontinence.  Baseline:  Goal status: INITIAL  3.  Pt will demonstrate consistent 3/5 pelvic floor muscle strength with appropriate coordination.  Baseline:  Goal status: INITIAL  4.  Pt will report no leaks with laughing, coughing,  sneezing in order to improve comfort with interpersonal relationships and community activities.   Baseline:  Goal status: INITIAL  5.  Pt will feel confident not using panty liner underwear.  Baseline:  Goal status: INITIAL  6.  Pt will increase pelvic floor muscle endurance to greater than 10 seconds.  Baseline:  Goal status: INITIAL  PLAN:  PT FREQUENCY: 1-2x/week  PT DURATION: 12 weeks  PLANNED INTERVENTIONS: 97110-Therapeutic exercises, 97530- Therapeutic activity, 97112- Neuromuscular re-education, 97535- Self Care, [id number]- Manual therapy, Dry Needling, and Biofeedback  PLAN FOR NEXT SESSION: Begin core training;  mobility activities.    Julio Alm, PT, DPT03/25/251:58 PM

## 2024-03-21 DIAGNOSIS — Z1331 Encounter for screening for depression: Secondary | ICD-10-CM | POA: Diagnosis not present

## 2024-03-21 DIAGNOSIS — M8580 Other specified disorders of bone density and structure, unspecified site: Secondary | ICD-10-CM | POA: Diagnosis not present

## 2024-03-21 DIAGNOSIS — K589 Irritable 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: Secondary | ICD-10-CM | POA: Diagnosis not present

## 2024-03-21 DIAGNOSIS — E039 Hypothyroidism, unspecified: Secondary | ICD-10-CM | POA: Diagnosis not present

## 2024-03-21 DIAGNOSIS — E782 Mixed hyperlipidemia: Secondary | ICD-10-CM | POA: Diagnosis not present

## 2024-03-21 DIAGNOSIS — Z0000 Encounter for general adult medical examination without abnormal findings: Secondary | ICD-10-CM | POA: Diagnosis not present

## 2024-03-21 DIAGNOSIS — Z1339 Encounter for screening examination for other mental health and behavioral disorders: Secondary | ICD-10-CM | POA: Diagnosis not present

## 2024-03-22 ENCOUNTER — Other Ambulatory Visit: Payer: Self-pay | Admitting: Family Medicine

## 2024-03-22 DIAGNOSIS — M8580 Other specified disorders of bone density and structure, unspecified site: Secondary | ICD-10-CM

## 2024-03-26 DIAGNOSIS — R69 Illness, unspecified: Secondary | ICD-10-CM | POA: Diagnosis not present

## 2024-03-27 DIAGNOSIS — F0390 Unspecified dementia without behavioral disturbance: Secondary | ICD-10-CM | POA: Diagnosis not present

## 2024-03-27 DIAGNOSIS — I10 Essential (primary) hypertension: Secondary | ICD-10-CM | POA: Diagnosis not present

## 2024-03-27 DIAGNOSIS — M940 Chondrocostal junction syndrome [Tietze]: Secondary | ICD-10-CM | POA: Diagnosis not present

## 2024-03-27 DIAGNOSIS — K589 Irritable 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: Secondary | ICD-10-CM | POA: Diagnosis not present

## 2024-03-27 DIAGNOSIS — K5900 Constipation, unspecified: Secondary | ICD-10-CM | POA: Diagnosis not present

## 2024-03-28 ENCOUNTER — Other Ambulatory Visit (HOSPITAL_BASED_OUTPATIENT_CLINIC_OR_DEPARTMENT_OTHER): Payer: Self-pay

## 2024-03-28 DIAGNOSIS — H00022 Hordeolum internum right lower eyelid: Secondary | ICD-10-CM | POA: Diagnosis not present

## 2024-03-28 DIAGNOSIS — H02881 Meibomian gland dysfunction right upper eyelid: Secondary | ICD-10-CM | POA: Diagnosis not present

## 2024-03-28 DIAGNOSIS — H02884 Meibomian gland dysfunction left upper eyelid: Secondary | ICD-10-CM | POA: Diagnosis not present

## 2024-03-28 MED ORDER — MAXITROL 3.5-10000-0.1 OP OINT
TOPICAL_OINTMENT | OPHTHALMIC | 2 refills | Status: AC
  Filled 2024-03-28: qty 3.5, 30d supply, fill #0
  Filled 2024-04-20: qty 3.5, 30d supply, fill #1
  Filled 2024-05-20: qty 3.5, 30d supply, fill #2

## 2024-03-29 ENCOUNTER — Ambulatory Visit: Payer: Medicare Other | Admitting: Physical Therapy

## 2024-04-01 ENCOUNTER — Ambulatory Visit: Payer: Self-pay | Attending: Urology

## 2024-04-01 DIAGNOSIS — R279 Unspecified lack of coordination: Secondary | ICD-10-CM

## 2024-04-01 DIAGNOSIS — R293 Abnormal posture: Secondary | ICD-10-CM | POA: Diagnosis not present

## 2024-04-01 DIAGNOSIS — M6281 Muscle weakness (generalized): Secondary | ICD-10-CM

## 2024-04-01 DIAGNOSIS — R69 Illness, unspecified: Secondary | ICD-10-CM | POA: Diagnosis not present

## 2024-04-01 NOTE — Patient Instructions (Addendum)
  Urge Drill  When you feel an urge to go, follow these steps to regain control: Stop what you are doing and be still Take one deep breath, directing your air into your abdomen Think an affirming thought, such as "I've got this." Do 5 quick flicks of your pelvic floor Walk with control to the bathroom to void, or delay voiding

## 2024-04-01 NOTE — Therapy (Signed)
 OUTPATIENT PHYSICAL THERAPY FEMALE PELVIC TREATMENT   Patient Name: Wendy Joyce MRN: [id number] DOB:04-23-1946, 78 y.o., female Today's Date: 04/01/2024  END OF SESSION:  PT End of Session - 04/01/24 1101     Visit Number 3    Date for PT Re-Evaluation 05/02/24    Authorization Type [employer] Medicare    Progress Note Due on Visit 10    PT Start Time 1100    PT Stop Time 1140    PT Time Calculation (min) 40 min    Activity Tolerance Patient tolerated treatment well    Behavior During Therapy [facility] for tasks assessed/performed               Past Medical History:  Diagnosis Date   Abdominal pain, generalized 01/29/2015   Abnormal thyroid function test 02/25/2016   ADHD (attention deficit hyperactivity disorder)    Allergic rhinitis, unspecified 01/29/2015   Allergy    Anemia    DOE   Ankle sprain 03/16/2016   Anxiety 01/29/2015   Arthritis    hands   Chronic kidney disease 2012   kidney stones   Constipation 01/29/2015   Decreased libido 05/02/2018   Disorder of the skin and subcutaneous tissue, unspecified 01/29/2015   DJD (degenerative joint disease)    hand   Dysthymia    Endometriosis    External otitis    Family history of coronary artery disease 05/02/2018   Fatigue due to excessive exertion 02/24/2016   Fever blister    Fibromyalgia 01/29/2015   Frequency of micturition 01/29/2015   Functional dyspepsia 01/29/2015   Gastro-esophageal reflux disease without esophagitis 01/29/2015   Headache(784.0)    High cholesterol    Hyperlipidemia 09/12/2016   Hypersomnia    Hypothyroidism    IBS (irritable bowel syndrome) 05/02/2018   Insomnia    Internal hemorrhoids    Irritable 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 01/29/2015   Labial pain 11/30/2016   Lactose intolerance 12/14/2016   Lactose intolerance in adult 09/12/2016   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   Left ureteral stone    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   Major depressive disorder, single  episode, unspecified    Migraine with aura, not intractable, without status migrainosus    Mixed hyperlipidemia    Mouth pain 01/07/2016   Osteoarthritis of hip    Osteopenia determined by x-ray 03/02/2017   Personal history of estrogen therapy    Phlebitis and thrombophlebitis of the leg    Pityriasis alba 01/29/2015   Pure hypercholesterolemia 06/29/2016   Rectum pain 09/12/2016   Recurrent sinus infections 02/24/2016   Rhinosinusitis 01/07/2016   Rosacea    Sinusitis 12/24/2015   Tinnitus of left ear 01/29/2015   Umbilical hernia without obstruction and without gangrene 01/29/2015   Umbilical pain 05/02/2018   Unspecified urinary incontinence 01/07/2016   Urticaria    UTI (urinary tract infection)    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   Vitamin D deficiency    Past Surgical History:  Procedure Laterality Date   BREAST ENHANCEMENT SURGERY     BREAST REDUCTION SURGERY Bilateral 2018   COLONOSCOPY     COLONOSCOPY WITH PROPOFOL N/A 02/25/2013   Procedure: COLONOSCOPY WITH PROPOFOL;  Surgeon: Charolett Bumpers, MD;  Location: [facility];  Service: Endoscopy;  Laterality: N/A;   left hand surgery  12/28/2022   mini facelift  2000   NASAL SINUS SURGERY     REDUCTION MAMMAPLASTY     ROTATOR CUFF REPAIR  Right   ROTATOR CUFF REPAIR     Left   SEPTOPLASTY WITH ETHMOIDECTOMY, AND MAXILLARY ANTROSTOMY Bilateral 2006   TOTAL ABDOMINAL HYSTERECTOMY     ovaries remain   TUBAL LIGATION     umbicial hernia     Patient Active Problem List   Diagnosis Date Noted   Post concussive syndrome 01/18/2024   Bradycardia, sinus 11/15/2023   Primary insomnia 11/15/2023   Right lateral abdominal pain 05/17/2023   RUQ abdominal pain 04/12/2023   Depression, recurrent (HCC) 12/25/2022   Preoperative clearance 11/23/2022   Encounter for counseling 10/14/2022   Influenza 09/18/2022   Pain of left hand 06/29/2022   Vasomotor symptoms due to  menopause 05/30/2022   Pre-diabetes 05/30/2022   Anxiety disorder 03/17/2022   Allergic rhinitis 03/17/2022   Constipation 03/17/2022   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   Exertional dyspnea 03/17/2022   Family history of coronary artery disease 03/17/2022   Weakness with dizziness 03/17/2022   Fever blister 03/17/2022   Frequency of micturition 03/17/2022   Irritable bowel syndrome 03/17/2022   Lactose intolerance 03/17/2022   Left anterior fascicular block 03/17/2022   Lichen planus 03/17/2022   Panniculitis 03/17/2022   Refractory migraine with aura 03/17/2022   Statin not tolerated 03/17/2022   Thrombophlebitis of superficial veins of lower extremity 03/17/2022   Bilateral tinnitus 03/17/2022   Umbilical hernia 03/17/2022   Urinary incontinence 03/17/2022   Urticaria due to drug allergy 03/17/2022   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 03/17/2022   Sexual dysfunction 03/17/2022   Imbalance 03/17/2022   Fibromyalgia 09/21/2021   Hyperlipidemia 09/21/2021   Hashimoto's thyroiditis 03/31/2021   Hypothyroidism 09/27/2013    PCP: Alyson Reedy, FNP  REFERRING PROVIDER: Lolita Patella, MD  REFERRING DIAG: R39.15 (ICD-10-CM) - Urgency of urination N39.3 (ICD-10-CM) - Stress incontinence (female) (female)  THERAPY DIAG:  Muscle weakness (generalized)  Unspecified lack of coordination  Abnormal posture  Rationale for Evaluation and Treatment: Rehabilitation  ONSET DATE: 09/19/23  SUBJECTIVE:                                                                                                                                                                                           SUBJECTIVE STATEMENT: Pt states that she has been having to get up more at night.   PAIN:  Are you having pain? No   PRECAUTIONS: None  RED FLAGS: None   WEIGHT BEARING RESTRICTIONS: No  FALLS:  Has patient fallen in last 6 months? No  OCCUPATION: retired;  working as an Tree surgeon  ACTIVITY LEVEL : works out with Psychologist, educational 1x/week; treadmill daily; stretching   PLOF:  Independent  PATIENT GOALS: stop wearing panty liner underwear  PERTINENT HISTORY:  Abdominal hysterectomy, tubal ligation; umbilical hernia, endometriosis, fibromyalgia, GERD, IBS, labial pain, lichens planus, anxiety/depression, fissures  BOWEL MOVEMENT: Pain with bowel movement: No Type of bowel movement:1x/day Fully empty rectum: Yes: - Leakage: No Pads: No Fiber supplement/laative No  URINATION: Pain with urination: No Fully empty bladder: No Stream: Weak Urgency: Yes: only if she has waited too long, but much better since surgery  Frequency: every hour when active; nocturia 1x/night with slow emptying Leakage: Coughing, Sneezing, Laughing, Exercise, and fast walking; sit>stand Pads: Yes: panty liner underwear - would like to get rid of   INTERCOURSE:  Ability to have vaginal penetration Yes.   Pain with intercourse:  discomfort DrynessYes; not using lubricant Climax: WNL Marinoff Scale: 1/3  PREGNANCY: Vaginal deliveries 2 Tearing Yes: - Episiotomy No C-section deliveries 0 Currently pregnant No  PROLAPSE: None   OBJECTIVE:  Note: Objective measures were completed at Evaluation unless otherwise noted. 02/08/24  COGNITION: Overall cognitive status: Within functional limits for tasks assessed     SENSATION: Light touch: Appears intact   FUNCTIONAL TESTS:  Squat: WNL - some wobbling Single leg stance:  Rt: pelvic drop  Lt: pelvic drop Curl-up test: no diastasis; lower abdominal distortion   GAIT: Assistive device utilized: None Comments: WNL  POSTURE: rounded shoulders, forward head, decreased lumbar lordosis, decreased thoracic kyphosis, posterior pelvic tilt, and right pelvic obliquity   LUMBARAROM/PROM:  A/PROM A/PROM  Eval (% available)  Flexion 50  Extension 25  Right lateral flexion 50  Left lateral flexion 50  Right  rotation 50  Left rotation 50   (Blank rows = not tested)  PALPATION:   General: restriction throughout thoracic and lumbar spine  Pelvic Alignment: Rt posterior rotation  Abdominal: scar tissue restriction; tightness in upper right quadrant with tenderness; decreased lower rib cage mobility                External Perineal Exam: some increase in redness                             Internal Pelvic Floor: tenderness at posterior fourchette  Patient confirms identification and approves PT to assess internal pelvic floor and treatment Yes  PELVIC MMT:   MMT eval  Vaginal 1-2/5, 2 second hold, 6 repeat contractions, but bearing down instead of active contraction; very hard time with consistent coordination  Diastasis Recti No diastasis; some lower abdominal distortion  (Blank rows = not tested)        TONE: low  PROLAPSE: WNL in supine  TODAY'S TREATMENT:                                                                                                                              DATE:  04/01/24 Neuromuscular re-education: Transversus abdominus isometrics 10x Bil supine UE ball press with transversus abdominus and pelvic  floor muscle contractions and breath coordination 10x Supine hip adduction ball press with transversus abdominus and pelvic floor muscle contractions and breath coordination 10x Unilateral side lying UE ball press with transversus abdominus and pelvic floor muscle contractions and breath coordination 10x Unilateral side lying UE ball press in clam shell with transversus abdominus and pelvic floor muscle contractions and breath coordination 10x Bridge with hip adduction, transversus abdominus, and pelvic floor muscle 2 x 10 Supine leg extensions 10x bil Supine dead bug with ball press 10x bil Seated hip adduction ball press with transversus abdominus and pelvic floor muscle 2 x 10 Seated hip abduction red band with transversus abdominus and pelvic floor muscle 2 x  10 Seated resisted march red band with transversus abdominus and pelvic floor muscle 2 x 10 Squats to table with cues for good form and deep core activation 2 x 10 Therapeutic exercise: Seated forward fold 10 breaths Seated thoracic openers 5x bil Seated piriformis stretch 60 sec bil Therapeutic activities: Nightly timing of fluids Using urge drill when she wakes    03/19/24 Neuromuscular re-education: Review of pelvic floor muscle strengthening HEP and the urge drill and the knack Transversus abdominus training with multimodal cues for improved motor control and breath coordination Transversus abdominus isometrics 10x Bil supine UE ball press with transversus abdominus and pelvic floor muscle contractions and breath coordination 10x Supine hip adduction ball press with transversus abdominus and pelvic floor muscle contractions and breath coordination 10x Unilateral side lying UE ball press with transversus abdominus and pelvic floor muscle contractions and breath coordination 10x Unilateral side lying UE ball press in clam shell with transversus abdominus and pelvic floor muscle contractions and breath coordination 10x Bridge with hip adduction, transversus abdominus, and pelvic floor muscle 2 x 10 Seated hip adduction ball press with transversus abdominus and pelvic floor muscle 2 x 10 Seated hip abduction red band with transversus abdominus and pelvic floor muscle 2 x 10 Seated resisted march red band with transversus abdominus and pelvic floor muscle 2 x 10   02/08/24  EVAL  Neuromuscular re-education: Pt provides verbal consent for internal vaginal/rectal pelvic floor exam. Internal vaginal pelvic floor muscle contraction training Quick flicks Long holds  Urge drill The knack  PATIENT EDUCATION:  Education details: See above  Person educated: Patient Education method: Explanation, Demonstration, Tactile cues, Verbal cues, and Handouts Education comprehension: verbalized  understanding  HOME EXERCISE PROGRAM: [id number]  ASSESSMENT:  CLINICAL IMPRESSION: Pt states that she has been having more nocturia, 2-3x/night. This could be due to stress and bladder being retrained to empty at night. We went over nightly fluid timing and urge drill to help retrain bladder to hold through the night. We went over all progressions from last visit due to being unable to practice in the last two weeks (death in the family). Due to this, we reviewed deep core training and exercises from last session with several progressions in supine and seated positions. She will continue to benefit from skilled PT intervention in order to decrease urinary incontinence and urgency, improve pressure management, and begin/progress functional strengthening program.   OBJECTIVE IMPAIRMENTS: decreased activity tolerance, decreased coordination, decreased endurance, decreased mobility, decreased strength, increased fascial restrictions, increased muscle spasms, impaired tone, postural dysfunction, and pain.   ACTIVITY LIMITATIONS: continence  PARTICIPATION LIMITATIONS: community activity  PERSONAL FACTORS: 1-2 comorbidities: medical history  are also affecting patient's functional outcome.   REHAB POTENTIAL: Good  CLINICAL DECISION MAKING: Evolving/moderate complexity  EVALUATION COMPLEXITY: Moderate   GOALS: Goals  reviewed with patient? Yes  SHORT TERM GOALS: Target date: 03/07/2024 - updated 04/01/2024   Pt will be independent with HEP.   Baseline: Goal status: MET 04/01/24  2.  Pt will be independent with the knack, urge suppression technique, and double voiding in order to improve bladder habits and decrease urinary incontinence.   Baseline:  Goal status: MET 04/01/24  3.  Pt will be able to correctly perform diaphragmatic breathing and appropriate pressure management in order to prevent worsening vaginal wall laxity and improve pelvic floor A/ROM.   Baseline:  Goal status: IN  PROGRESS 04/01/24  4.  Pt will report 25% improvement in urinary incontinence.  Baseline:  Goal status: IN PROGRESS 04/01/24  5.  Pt will improve pelvic floor muscle strength to consistently 2/5 with appropriate coordination.  Baseline:  Goal status: IN PROGRESS 04/01/24   LONG TERM GOALS: Target date: 05/02/24 - updated 04/01/24  Pt will be independent with advanced HEP.   Baseline:  Goal status: IN PROGRESS 04/01/24  2.  Pt will report 75% improvement in urinary incontinence.  Baseline:  Goal status: IN PROGRESS 04/01/24  3.  Pt will demonstrate consistent 3/5 pelvic floor muscle strength with appropriate coordination.  Baseline:  Goal status: IN PROGRESS 04/01/24  4.  Pt will report no leaks with laughing, coughing, sneezing in order to improve comfort with interpersonal relationships and community activities.   Baseline:  Goal status: IN PROGRESS 04/01/24  5.  Pt will feel confident not using panty liner underwear.  Baseline:  Goal status: IN PROGRESS 04/01/24  6.  Pt will increase pelvic floor muscle endurance to greater than 10 seconds.  Baseline:  Goal status: IN PROGRESS 04/01/24  PLAN:  PT FREQUENCY: 1-2x/week  PT DURATION: 12 weeks  PLANNED INTERVENTIONS: 97110-Therapeutic exercises, 97530- Therapeutic activity, 97112- Neuromuscular re-education, 97535- Self Care, [id number]- Manual therapy, Dry Needling, and Biofeedback  PLAN FOR NEXT SESSION: Progress core training; mobility activities.    Julio Alm, PT, DPT04/06/2510:40 AM

## 2024-04-04 ENCOUNTER — Encounter: Payer: Medicare Other | Admitting: Physical Therapy

## 2024-04-08 ENCOUNTER — Other Ambulatory Visit (HOSPITAL_BASED_OUTPATIENT_CLINIC_OR_DEPARTMENT_OTHER): Payer: Self-pay

## 2024-04-08 DIAGNOSIS — R69 Illness, unspecified: Secondary | ICD-10-CM | POA: Diagnosis not present

## 2024-04-11 ENCOUNTER — Ambulatory Visit: Payer: Medicare Other

## 2024-04-11 ENCOUNTER — Other Ambulatory Visit: Payer: Self-pay | Admitting: Neurology

## 2024-04-11 DIAGNOSIS — R293 Abnormal posture: Secondary | ICD-10-CM | POA: Diagnosis not present

## 2024-04-11 DIAGNOSIS — R279 Unspecified lack of coordination: Secondary | ICD-10-CM | POA: Diagnosis not present

## 2024-04-11 DIAGNOSIS — M6281 Muscle weakness (generalized): Secondary | ICD-10-CM

## 2024-04-11 NOTE — Therapy (Signed)
 OUTPATIENT PHYSICAL THERAPY FEMALE PELVIC TREATMENT   Patient Name: Wendy Joyce MRN: [id number] DOB:1946-09-09, 78 y.o., female Today's Date: 04/11/2024  END OF SESSION:  PT End of Session - 04/11/24 1235     Visit Number 4    Date for PT Re-Evaluation 05/02/24    Authorization Type [employer] Medicare    Progress Note Due on Visit 10    PT Start Time 1233    PT Stop Time 1311    PT Time Calculation (min) 38 min    Activity Tolerance Patient tolerated treatment well    Behavior During Therapy WFL for tasks assessed/performed               Past Medical History:  Diagnosis Date   Abdominal pain, generalized 01/29/2015   Abnormal thyroid function test 02/25/2016   ADHD (attention deficit hyperactivity disorder)    Allergic rhinitis, unspecified 01/29/2015   Allergy    Anemia    DOE   Ankle sprain 03/16/2016   Anxiety 01/29/2015   Arthritis    hands   Chronic kidney disease 2012   kidney stones   Constipation 01/29/2015   Decreased libido 05/02/2018   Disorder of the skin and subcutaneous tissue, unspecified 01/29/2015   DJD (degenerative joint disease)    hand   Dysthymia    Endometriosis    External otitis    Family history of coronary artery disease 05/02/2018   Fatigue due to excessive exertion 02/24/2016   Fever blister    Fibromyalgia 01/29/2015   Frequency of micturition 01/29/2015   Functional dyspepsia 01/29/2015   Gastro-esophageal reflux disease without esophagitis 01/29/2015   Headache(784.0)    High cholesterol    Hyperlipidemia 09/12/2016   Hypersomnia    Hypothyroidism    IBS (irritable bowel syndrome) 05/02/2018   Insomnia    Internal hemorrhoids    Irritable 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 01/29/2015   Labial pain 11/30/2016   Lactose intolerance 12/14/2016   Lactose intolerance in adult 09/12/2016   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   Left ureteral stone    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   Major depressive disorder,  single episode, unspecified    Migraine with aura, not intractable, without status migrainosus    Mixed hyperlipidemia    Mouth pain 01/07/2016   Osteoarthritis of hip    Osteopenia determined by x-ray 03/02/2017   Personal history of estrogen therapy    Phlebitis and thrombophlebitis of the leg    Pityriasis alba 01/29/2015   Pure hypercholesterolemia 06/29/2016   Rectum pain 09/12/2016   Recurrent sinus infections 02/24/2016   Rhinosinusitis 01/07/2016   Rosacea    Sinusitis 12/24/2015   Tinnitus of left ear 01/29/2015   Umbilical hernia without obstruction and without gangrene 01/29/2015   Umbilical pain 05/02/2018   Unspecified urinary incontinence 01/07/2016   Urticaria    UTI (urinary tract infection)    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   Vitamin D deficiency    Past Surgical History:  Procedure Laterality Date   BREAST ENHANCEMENT SURGERY     BREAST REDUCTION SURGERY Bilateral 2018   COLONOSCOPY     COLONOSCOPY WITH PROPOFOL N/A 02/25/2013   Procedure: COLONOSCOPY WITH PROPOFOL;  Surgeon: Charolett Bumpers, MD;  Location: [facility];  Service: Endoscopy;  Laterality: N/A;   left hand surgery  12/28/2022   mini facelift  2000   NASAL SINUS SURGERY     REDUCTION MAMMAPLASTY     ROTATOR CUFF REPAIR  Right   ROTATOR CUFF REPAIR     Left   SEPTOPLASTY WITH ETHMOIDECTOMY, AND MAXILLARY ANTROSTOMY Bilateral 2006   TOTAL ABDOMINAL HYSTERECTOMY     ovaries remain   TUBAL LIGATION     umbicial hernia     Patient Active Problem List   Diagnosis Date Noted   Post concussive syndrome 01/18/2024   Bradycardia, sinus 11/15/2023   Primary insomnia 11/15/2023   Right lateral abdominal pain 05/17/2023   RUQ abdominal pain 04/12/2023   Depression, recurrent (HCC) 12/25/2022   Preoperative clearance 11/23/2022   Encounter for counseling 10/14/2022   Influenza 09/18/2022   Pain of left hand 06/29/2022   Vasomotor symptoms due to  menopause 05/30/2022   Pre-diabetes 05/30/2022   Anxiety disorder 03/17/2022   Allergic rhinitis 03/17/2022   Constipation 03/17/2022   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   Exertional dyspnea 03/17/2022   Family history of coronary artery disease 03/17/2022   Weakness with dizziness 03/17/2022   Fever blister 03/17/2022   Frequency of micturition 03/17/2022   Irritable bowel syndrome 03/17/2022   Lactose intolerance 03/17/2022   Left anterior fascicular block 03/17/2022   Lichen planus 03/17/2022   Panniculitis 03/17/2022   Refractory migraine with aura 03/17/2022   Statin not tolerated 03/17/2022   Thrombophlebitis of superficial veins of lower extremity 03/17/2022   Bilateral tinnitus 03/17/2022   Umbilical hernia 03/17/2022   Urinary incontinence 03/17/2022   Urticaria due to drug allergy 03/17/2022   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 03/17/2022   Sexual dysfunction 03/17/2022   Imbalance 03/17/2022   Fibromyalgia 09/21/2021   Hyperlipidemia 09/21/2021   Hashimoto's thyroiditis 03/31/2021   Hypothyroidism 09/27/2013    PCP: Wilhelmena Hanson, FNP  REFERRING PROVIDER: Sharlyne Deans, MD  REFERRING DIAG: R39.15 (ICD-10-CM) - Urgency of urination N39.3 (ICD-10-CM) - Stress incontinence (female) (female)  THERAPY DIAG:  Muscle weakness (generalized)  Unspecified lack of coordination  Abnormal posture  Rationale for Evaluation and Treatment: Rehabilitation  ONSET DATE: 09/19/23  SUBJECTIVE:                                                                                                                                                                                           SUBJECTIVE STATEMENT: Pt states that she has been having to get up more at night.   PAIN:  Are you having pain? No   PRECAUTIONS: None  RED FLAGS: None   WEIGHT BEARING RESTRICTIONS: No  FALLS:  Has patient fallen in last 6 months? No  OCCUPATION: retired;  working as an Tree surgeon  ACTIVITY LEVEL : works out with Psychologist, educational 1x/week; treadmill daily; stretching   PLOF:  Independent  PATIENT GOALS: stop wearing panty liner underwear  PERTINENT HISTORY:  Abdominal hysterectomy, tubal ligation; umbilical hernia, endometriosis, fibromyalgia, GERD, IBS, labial pain, lichens planus, anxiety/depression, fissures  BOWEL MOVEMENT: Pain with bowel movement: No Type of bowel movement:1x/day Fully empty rectum: Yes: - Leakage: No Pads: No Fiber supplement/laative No  URINATION: Pain with urination: No Fully empty bladder: No Stream: Weak Urgency: Yes: only if she has waited too long, but much better since surgery  Frequency: every hour when active; nocturia 1x/night with slow emptying Leakage: Coughing, Sneezing, Laughing, Exercise, and fast walking; sit>stand Pads: Yes: panty liner underwear - would like to get rid of   INTERCOURSE:  Ability to have vaginal penetration Yes.   Pain with intercourse:  discomfort DrynessYes; not using lubricant Climax: WNL Marinoff Scale: 1/3  PREGNANCY: Vaginal deliveries 2 Tearing Yes: - Episiotomy No C-section deliveries 0 Currently pregnant No  PROLAPSE: None   OBJECTIVE:  Note: Objective measures were completed at Evaluation unless otherwise noted. 02/08/24  COGNITION: Overall cognitive status: Within functional limits for tasks assessed     SENSATION: Light touch: Appears intact   FUNCTIONAL TESTS:  Squat: WNL - some wobbling Single leg stance:  Rt: pelvic drop  Lt: pelvic drop Curl-up test: no diastasis; lower abdominal distortion   GAIT: Assistive device utilized: None Comments: WNL  POSTURE: rounded shoulders, forward head, decreased lumbar lordosis, decreased thoracic kyphosis, posterior pelvic tilt, and right pelvic obliquity   LUMBARAROM/PROM:  A/PROM A/PROM  Eval (% available)  Flexion 50  Extension 25  Right lateral flexion 50  Left lateral flexion 50  Right  rotation 50  Left rotation 50   (Blank rows = not tested)  PALPATION:   General: restriction throughout thoracic and lumbar spine  Pelvic Alignment: Rt posterior rotation  Abdominal: scar tissue restriction; tightness in upper right quadrant with tenderness; decreased lower rib cage mobility                External Perineal Exam: some increase in redness                             Internal Pelvic Floor: tenderness at posterior fourchette  Patient confirms identification and approves PT to assess internal pelvic floor and treatment Yes  PELVIC MMT:   MMT eval  Vaginal 1-2/5, 2 second hold, 6 repeat contractions, but bearing down instead of active contraction; very hard time with consistent coordination  Diastasis Recti No diastasis; some lower abdominal distortion  (Blank rows = not tested)        TONE: low  PROLAPSE: WNL in supine  TODAY'S TREATMENT:                                                                                                                              DATE:  04/11/24 Neuromuscular re-education: Seated hip abduction green band with transversus abdominus and pelvic floor muscle 2 x  10 Seated resisted rotation green band 10x bil Seated horizontal abduction green band 2 x 10 Seated unilateral hip abduction green band 10x bil Exercises: Seated hamstring stretch 60 seconds bil Seated piriformis stretch 60 seconds bil Therapeutic activities: Squats to table 2 x 10 Pallof press red band 10x bil Shoulder extensions red band 2 x 10 3-way kick 10x each, bil   04/01/24 Neuromuscular re-education: Transversus abdominus isometrics 10x Bil supine UE ball press with transversus abdominus and pelvic floor muscle contractions and breath coordination 10x Supine hip adduction ball press with transversus abdominus and pelvic floor muscle contractions and breath coordination 10x Unilateral side lying UE ball press with transversus abdominus and pelvic floor muscle  contractions and breath coordination 10x Unilateral side lying UE ball press in clam shell with transversus abdominus and pelvic floor muscle contractions and breath coordination 10x Bridge with hip adduction, transversus abdominus, and pelvic floor muscle 2 x 10 Supine leg extensions 10x bil Supine dead bug with ball press 10x bil Seated hip adduction ball press with transversus abdominus and pelvic floor muscle 2 x 10 Seated hip abduction red band with transversus abdominus and pelvic floor muscle 2 x 10 Seated resisted march red band with transversus abdominus and pelvic floor muscle 2 x 10 Squats to table with cues for good form and deep core activation 2 x 10 Therapeutic exercise: Seated forward fold 10 breaths Seated thoracic openers 5x bil Seated piriformis stretch 60 sec bil Therapeutic activities: Nightly timing of fluids Using urge drill when she wakes    03/19/24 Neuromuscular re-education: Review of pelvic floor muscle strengthening HEP and the urge drill and the knack Transversus abdominus training with multimodal cues for improved motor control and breath coordination Transversus abdominus isometrics 10x Bil supine UE ball press with transversus abdominus and pelvic floor muscle contractions and breath coordination 10x Supine hip adduction ball press with transversus abdominus and pelvic floor muscle contractions and breath coordination 10x Unilateral side lying UE ball press with transversus abdominus and pelvic floor muscle contractions and breath coordination 10x Unilateral side lying UE ball press in clam shell with transversus abdominus and pelvic floor muscle contractions and breath coordination 10x Bridge with hip adduction, transversus abdominus, and pelvic floor muscle 2 x 10 Seated hip adduction ball press with transversus abdominus and pelvic floor muscle 2 x 10 Seated hip abduction red band with transversus abdominus and pelvic floor muscle 2 x 10 Seated  resisted march red band with transversus abdominus and pelvic floor muscle 2 x 10   PATIENT EDUCATION:  Education details: See above  Person educated: Patient Education method: Programmer, multimedia, Demonstration, Actor cues, Verbal cues, and Handouts Education comprehension: verbalized understanding  HOME EXERCISE PROGRAM: [id number]  ASSESSMENT:  CLINICAL IMPRESSION: Pt seeing some progress in the last week with bladder control, but she is still waking up 1-2x a night. Due to performing the urge drill slightly wrong, we went over and believe she will see some improved benefit with corrections. We also reviewed performing at night when she wakes if she can remember to help perform better habits of sleeping through the night. She did very well with exercise progressions today and HEP updated so she can bring exercises on trip. She did have some increase in Rt hip discomfort with 3 way kick, but she tolerated stretches to area well. She will continue to benefit from skilled PT intervention in order to decrease urinary incontinence and urgency, improve pressure management, and begin/progress functional strengthening program.   OBJECTIVE IMPAIRMENTS: decreased  activity tolerance, decreased coordination, decreased endurance, decreased mobility, decreased strength, increased fascial restrictions, increased muscle spasms, impaired tone, postural dysfunction, and pain.   ACTIVITY LIMITATIONS: continence  PARTICIPATION LIMITATIONS: community activity  PERSONAL FACTORS: 1-2 comorbidities: medical history  are also affecting patient's functional outcome.   REHAB POTENTIAL: Good  CLINICAL DECISION MAKING: Evolving/moderate complexity  EVALUATION COMPLEXITY: Moderate   GOALS: Goals reviewed with patient? Yes  SHORT TERM GOALS: Target date: 03/07/2024 - updated 04/01/2024   Pt will be independent with HEP.   Baseline: Goal status: MET 04/01/24  2.  Pt will be independent with the knack, urge  suppression technique, and double voiding in order to improve bladder habits and decrease urinary incontinence.   Baseline:  Goal status: MET 04/01/24  3.  Pt will be able to correctly perform diaphragmatic breathing and appropriate pressure management in order to prevent worsening vaginal wall laxity and improve pelvic floor A/ROM.   Baseline:  Goal status: IN PROGRESS 04/01/24  4.  Pt will report 25% improvement in urinary incontinence.  Baseline:  Goal status: IN PROGRESS 04/01/24  5.  Pt will improve pelvic floor muscle strength to consistently 2/5 with appropriate coordination.  Baseline:  Goal status: IN PROGRESS 04/01/24   LONG TERM GOALS: Target date: 05/02/24 - updated 04/01/24  Pt will be independent with advanced HEP.   Baseline:  Goal status: IN PROGRESS 04/01/24  2.  Pt will report 75% improvement in urinary incontinence.  Baseline:  Goal status: IN PROGRESS 04/01/24  3.  Pt will demonstrate consistent 3/5 pelvic floor muscle strength with appropriate coordination.  Baseline:  Goal status: IN PROGRESS 04/01/24  4.  Pt will report no leaks with laughing, coughing, sneezing in order to improve comfort with interpersonal relationships and community activities.   Baseline:  Goal status: IN PROGRESS 04/01/24  5.  Pt will feel confident not using panty liner underwear.  Baseline:  Goal status: IN PROGRESS 04/01/24  6.  Pt will increase pelvic floor muscle endurance to greater than 10 seconds.  Baseline:  Goal status: IN PROGRESS 04/01/24  PLAN:  PT FREQUENCY: 1-2x/week  PT DURATION: 12 weeks  PLANNED INTERVENTIONS: 97110-Therapeutic exercises, 97530- Therapeutic activity, 97112- Neuromuscular re-education, 97535- Self Care, [id number]- Manual therapy, Dry Needling, and Biofeedback  PLAN FOR NEXT SESSION: Progress core training; mobility activities. 2 more visits.    Verlena Glenn, PT, DPT04/17/251:18 PM

## 2024-04-12 ENCOUNTER — Other Ambulatory Visit (HOSPITAL_BASED_OUTPATIENT_CLINIC_OR_DEPARTMENT_OTHER): Payer: Self-pay

## 2024-04-12 ENCOUNTER — Other Ambulatory Visit: Payer: Self-pay

## 2024-04-15 ENCOUNTER — Other Ambulatory Visit: Payer: Self-pay

## 2024-04-15 ENCOUNTER — Other Ambulatory Visit (HOSPITAL_BASED_OUTPATIENT_CLINIC_OR_DEPARTMENT_OTHER): Payer: Self-pay

## 2024-04-15 MED ORDER — ATOMOXETINE HCL 10 MG PO CAPS
10.0000 mg | ORAL_CAPSULE | Freq: Every day | ORAL | 2 refills | Status: DC
  Filled 2024-04-15: qty 30, 30d supply, fill #0

## 2024-04-16 DIAGNOSIS — R69 Illness, unspecified: Secondary | ICD-10-CM | POA: Diagnosis not present

## 2024-04-17 ENCOUNTER — Other Ambulatory Visit (HOSPITAL_BASED_OUTPATIENT_CLINIC_OR_DEPARTMENT_OTHER): Payer: Self-pay

## 2024-04-17 ENCOUNTER — Encounter (HOSPITAL_BASED_OUTPATIENT_CLINIC_OR_DEPARTMENT_OTHER): Payer: Self-pay

## 2024-04-17 DIAGNOSIS — E785 Hyperlipidemia, unspecified: Secondary | ICD-10-CM | POA: Diagnosis not present

## 2024-04-17 DIAGNOSIS — B009 Herpesviral infection, unspecified: Secondary | ICD-10-CM | POA: Diagnosis not present

## 2024-04-17 DIAGNOSIS — T753XXA Motion sickness, initial encounter: Secondary | ICD-10-CM | POA: Diagnosis not present

## 2024-04-17 DIAGNOSIS — E559 Vitamin D deficiency, unspecified: Secondary | ICD-10-CM | POA: Diagnosis not present

## 2024-04-17 DIAGNOSIS — G4700 Insomnia, unspecified: Secondary | ICD-10-CM | POA: Diagnosis not present

## 2024-04-17 DIAGNOSIS — R5383 Other fatigue: Secondary | ICD-10-CM | POA: Diagnosis not present

## 2024-04-17 DIAGNOSIS — R739 Hyperglycemia, unspecified: Secondary | ICD-10-CM | POA: Diagnosis not present

## 2024-04-17 DIAGNOSIS — F909 Attention-deficit hyperactivity disorder, unspecified type: Secondary | ICD-10-CM | POA: Diagnosis not present

## 2024-04-17 MED ORDER — SCOPOLAMINE 1 MG/3DAYS TD PT72
MEDICATED_PATCH | TRANSDERMAL | 0 refills | Status: DC
Start: 2024-04-17 — End: 2024-05-21
  Filled 2024-04-17: qty 8, 24d supply, fill #0

## 2024-04-17 MED ORDER — VALACYCLOVIR HCL 500 MG PO TABS
500.0000 mg | ORAL_TABLET | Freq: Every day | ORAL | 3 refills | Status: DC
Start: 2024-04-17 — End: 2024-07-31
  Filled 2024-04-17: qty 90, 90d supply, fill #0
  Filled 2024-07-10 – 2024-07-11 (×2): qty 90, 90d supply, fill #1

## 2024-04-17 MED ORDER — ZALEPLON 10 MG PO CAPS
10.0000 mg | ORAL_CAPSULE | Freq: Every evening | ORAL | 1 refills | Status: DC | PRN
  Filled 2024-04-17: qty 30, 30d supply, fill #0

## 2024-04-18 ENCOUNTER — Other Ambulatory Visit (HOSPITAL_BASED_OUTPATIENT_CLINIC_OR_DEPARTMENT_OTHER): Payer: Self-pay

## 2024-04-18 ENCOUNTER — Ambulatory Visit: Payer: Medicare Other

## 2024-04-18 DIAGNOSIS — R279 Unspecified lack of coordination: Secondary | ICD-10-CM

## 2024-04-18 DIAGNOSIS — M6281 Muscle weakness (generalized): Secondary | ICD-10-CM | POA: Diagnosis not present

## 2024-04-18 DIAGNOSIS — R293 Abnormal posture: Secondary | ICD-10-CM

## 2024-04-18 NOTE — Therapy (Signed)
 OUTPATIENT PHYSICAL THERAPY FEMALE PELVIC TREATMENT   Patient Name: Erie Sica MRN: [id number] DOB:1946-05-10, 78 y.o., female Today's Date: 04/18/2024  END OF SESSION:  PT End of Session - 04/18/24 1233     Visit Number 5    Date for PT Re-Evaluation 05/02/24    Authorization Type [employer] Medicare    Progress Note Due on Visit 10    PT Start Time 1232    PT Stop Time 1259    PT Time Calculation (min) 27 min    Activity Tolerance Patient tolerated treatment well    Behavior During Therapy [facility] for tasks assessed/performed                Past Medical History:  Diagnosis Date   Abdominal pain, generalized 01/29/2015   Abnormal thyroid  function test 02/25/2016   ADHD (attention deficit hyperactivity disorder)    Allergic rhinitis, unspecified 01/29/2015   Allergy     Anemia    DOE   Ankle sprain 03/16/2016   Anxiety 01/29/2015   Arthritis    hands   Chronic kidney disease 2012   kidney stones   Constipation 01/29/2015   Decreased libido 05/02/2018   Disorder of the skin and subcutaneous tissue, unspecified 01/29/2015   DJD (degenerative joint disease)    hand   Dysthymia    Endometriosis    External otitis    Family history of coronary artery disease 05/02/2018   Fatigue due to excessive exertion 02/24/2016   Fever blister    Fibromyalgia 01/29/2015   Frequency of micturition 01/29/2015   Functional dyspepsia 01/29/2015   Gastro-esophageal reflux disease without esophagitis 01/29/2015   Headache(784.0)    High cholesterol    Hyperlipidemia 09/12/2016   Hypersomnia    Hypothyroidism    IBS (irritable bowel syndrome) 05/02/2018   Insomnia    Internal hemorrhoids    Irritable 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 01/29/2015   Labial pain 11/30/2016   Lactose intolerance 12/14/2016   Lactose intolerance in adult 09/12/2016   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   Left ureteral stone    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   Major depressive disorder,  single episode, unspecified    Migraine with aura, not intractable, without status migrainosus    Mixed hyperlipidemia    Mouth pain 01/07/2016   Osteoarthritis of hip    Osteopenia determined by x-ray 03/02/2017   Personal history of estrogen therapy    Phlebitis and thrombophlebitis of the leg    Pityriasis alba 01/29/2015   Pure hypercholesterolemia 06/29/2016   Rectum pain 09/12/2016   Recurrent sinus infections 02/24/2016   Rhinosinusitis 01/07/2016   Rosacea    Sinusitis 12/24/2015   Tinnitus of left ear 01/29/2015   Umbilical hernia without obstruction and without gangrene 01/29/2015   Umbilical pain 05/02/2018   Unspecified urinary incontinence 01/07/2016   Urticaria    UTI (urinary tract infection)    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   Vitamin D  deficiency    Past Surgical History:  Procedure Laterality Date   BREAST ENHANCEMENT SURGERY     BREAST REDUCTION SURGERY Bilateral 2018   COLONOSCOPY     COLONOSCOPY WITH PROPOFOL  N/A 02/25/2013   Procedure: COLONOSCOPY WITH PROPOFOL ;  Surgeon: Garrett Kallman, MD;  Location: [facility];  Service: Endoscopy;  Laterality: N/A;   left hand surgery  12/28/2022   mini facelift  2000   NASAL SINUS SURGERY     REDUCTION MAMMAPLASTY     ROTATOR CUFF REPAIR  Right   ROTATOR CUFF REPAIR     Left   SEPTOPLASTY WITH ETHMOIDECTOMY, AND MAXILLARY ANTROSTOMY Bilateral 2006   TOTAL ABDOMINAL HYSTERECTOMY     ovaries remain   TUBAL LIGATION     umbicial hernia     Patient Active Problem List   Diagnosis Date Noted   Post concussive syndrome 01/18/2024   Bradycardia, sinus 11/15/2023   Primary insomnia 11/15/2023   Right lateral abdominal pain 05/17/2023   RUQ abdominal pain 04/12/2023   Depression, recurrent (HCC) 12/25/2022   Preoperative clearance 11/23/2022   Encounter for counseling 10/14/2022   Influenza 09/18/2022   Pain of left hand 06/29/2022   Vasomotor symptoms due to  menopause 05/30/2022   Pre-diabetes 05/30/2022   Anxiety disorder 03/17/2022   Allergic rhinitis 03/17/2022   Constipation 03/17/2022   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   Exertional dyspnea 03/17/2022   Family history of coronary artery disease 03/17/2022   Weakness with dizziness 03/17/2022   Fever blister 03/17/2022   Frequency of micturition 03/17/2022   Irritable bowel syndrome 03/17/2022   Lactose intolerance 03/17/2022   Left anterior fascicular block 03/17/2022   Lichen planus 03/17/2022   Panniculitis 03/17/2022   Refractory migraine with aura 03/17/2022   Statin not tolerated 03/17/2022   Thrombophlebitis of superficial veins of lower extremity 03/17/2022   Bilateral tinnitus 03/17/2022   Umbilical hernia 03/17/2022   Urinary incontinence 03/17/2022   Urticaria due to drug allergy  03/17/2022   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 03/17/2022   Sexual dysfunction 03/17/2022   Imbalance 03/17/2022   Fibromyalgia 09/21/2021   Hyperlipidemia 09/21/2021   Hashimoto's thyroiditis 03/31/2021   Hypothyroidism 09/27/2013    PCP: Wilhelmena Hanson, FNP  REFERRING PROVIDER: Sharlyne Deans, MD  REFERRING DIAG: R39.15 (ICD-10-CM) - Urgency of urination N39.3 (ICD-10-CM) - Stress incontinence (female) (female)  THERAPY DIAG:  Muscle weakness (generalized)  Unspecified lack of coordination  Abnormal posture  Rationale for Evaluation and Treatment: Rehabilitation  ONSET DATE: 09/19/23  SUBJECTIVE:                                                                                                                                                                                           SUBJECTIVE STATEMENT: Pt states that she slept better last night and is feeling better due to this. She did not wake up to urinate at all last night. Pt states that bladder is overall about the same. She still feels like she has to go too often.   PAIN:  Are you having  pain? No   PRECAUTIONS: None  RED FLAGS: None   WEIGHT BEARING RESTRICTIONS: No  FALLS:  Has patient fallen in last 6 months? No  OCCUPATION: retired; working as an Tree surgeon  ACTIVITY LEVEL : works out with Psychologist, educational 1x/week; treadmill daily; stretching   PLOF: Independent  PATIENT GOALS: stop wearing panty liner underwear  PERTINENT HISTORY:  Abdominal hysterectomy, tubal ligation; umbilical hernia, endometriosis, fibromyalgia, GERD, IBS, labial pain, lichens planus, anxiety/depression, fissures  BOWEL MOVEMENT: Pain with bowel movement: No Type of bowel movement:1x/day Fully empty rectum: Yes: - Leakage: No Pads: No Fiber supplement/laative No  URINATION: Pain with urination: No Fully empty bladder: No Stream: Weak Urgency: Yes: only if she has waited too long, but much better since surgery  Frequency: every hour when active; nocturia 1x/night with slow emptying Leakage: Coughing, Sneezing, Laughing, Exercise, and fast walking; sit>stand Pads: Yes: panty liner underwear - would like to get rid of   INTERCOURSE:  Ability to have vaginal penetration Yes.   Pain with intercourse:  discomfort DrynessYes; not using lubricant Climax: WNL Marinoff Scale: 1/3  PREGNANCY: Vaginal deliveries 2 Tearing Yes: - Episiotomy No C-section deliveries 0 Currently pregnant No  PROLAPSE: None   OBJECTIVE:  Note: Objective measures were completed at Evaluation unless otherwise noted.  04/18/24               Internal Pelvic Floor: tenderness at posterior fourchette  Patient confirms identification and approves PT to assess internal pelvic floor and treatment Yes  PELVIC MMT:   MMT eval  Vaginal 1/5, 2 second hold, 2 repeat contractions; not achieving pelvic floor muscle contraction on her own without cuing  (Blank rows = not tested)        TONE: low  PROLAPSE: Palpated minor anterior vaginal wall laxity in supine today  02/08/24  COGNITION: Overall cognitive  status: Within functional limits for tasks assessed     SENSATION: Light touch: Appears intact   FUNCTIONAL TESTS:  Squat: WNL - some wobbling Single leg stance:  Rt: pelvic drop  Lt: pelvic drop Curl-up test: no diastasis; lower abdominal distortion   GAIT: Assistive device utilized: None Comments: WNL  POSTURE: rounded shoulders, forward head, decreased lumbar lordosis, decreased thoracic kyphosis, posterior pelvic tilt, and right pelvic obliquity   LUMBARAROM/PROM:  A/PROM A/PROM  Eval (% available)  Flexion 50  Extension 25  Right lateral flexion 50  Left lateral flexion 50  Right rotation 50  Left rotation 50   (Blank rows = not tested)  PALPATION:   General: restriction throughout thoracic and lumbar spine  Pelvic Alignment: Rt posterior rotation  Abdominal: scar tissue restriction; tightness in upper right quadrant with tenderness; decreased lower rib cage mobility                External Perineal Exam: some increase in redness                             Internal Pelvic Floor: tenderness at posterior fourchette  Patient confirms identification and approves PT to assess internal pelvic floor and treatment Yes  PELVIC MMT:   MMT eval  Vaginal 1-2/5, 2 second hold, 6 repeat contractions, but bearing down instead of active contraction; very hard time with consistent coordination  Diastasis Recti No diastasis; some lower abdominal distortion  (Blank rows = not tested)        TONE: low  PROLAPSE: WNL in supine  TODAY'S TREATMENT:  DATE:  04/18/24 Manual: Pt provides verbal consent for internal vaginal/rectal pelvic floor exam. Internal pelvic floor muscle reassessment Neuromuscular re-education: Internal vaginal pelvic floor muscle contraction training Quick flicks Urge drill Therapeutic activities: Discussing how to use  urge drill functionally with standing to help decrease trips to the bathroom and urge with putting bladder in gravity dependent position Difference between strengthening vs as needed pelvic floor muscle contractions    04/11/24 Neuromuscular re-education: Seated hip abduction green band with transversus abdominus and pelvic floor muscle 2 x 10 Seated resisted rotation green band 10x bil Seated horizontal abduction green band 2 x 10 Seated unilateral hip abduction green band 10x bil Exercises: Seated hamstring stretch 60 seconds bil Seated piriformis stretch 60 seconds bil Therapeutic activities: Squats to table 2 x 10 Pallof press red band 10x bil Shoulder extensions red band 2 x 10 3-way kick 10x each, bil   04/01/24 Neuromuscular re-education: Transversus abdominus isometrics 10x Bil supine UE ball press with transversus abdominus and pelvic floor muscle contractions and breath coordination 10x Supine hip adduction ball press with transversus abdominus and pelvic floor muscle contractions and breath coordination 10x Unilateral side lying UE ball press with transversus abdominus and pelvic floor muscle contractions and breath coordination 10x Unilateral side lying UE ball press in clam shell with transversus abdominus and pelvic floor muscle contractions and breath coordination 10x Bridge with hip adduction, transversus abdominus, and pelvic floor muscle 2 x 10 Supine leg extensions 10x bil Supine dead bug with ball press 10x bil Seated hip adduction ball press with transversus abdominus and pelvic floor muscle 2 x 10 Seated hip abduction red band with transversus abdominus and pelvic floor muscle 2 x 10 Seated resisted march red band with transversus abdominus and pelvic floor muscle 2 x 10 Squats to table with cues for good form and deep core activation 2 x 10 Therapeutic exercise: Seated forward fold 10 breaths Seated thoracic openers 5x bil Seated piriformis stretch 60 sec  bil Therapeutic activities: Nightly timing of fluids Using urge drill when she wakes     PATIENT EDUCATION:  Education details: See above  Person educated: Patient Education method: Programmer, multimedia, Demonstration, Tactile cues, Verbal cues, and Handouts Education comprehension: verbalized understanding  HOME EXERCISE PROGRAM: [id number]  ASSESSMENT:  CLINICAL IMPRESSION: Due to feeling like she is not seeing the progress that she wants, we performed internal vaginal pelvic floor muscle exam today. We found that she is not achieving active pelvic floor muscle contraction when she is attempting one. She was able to correct, but then struggled wit hthe coordination to achieve multiple contractions with good coordination. She did make some improvements has we went through contractions. She also demonstrated some minor anterior vaginal wall laxity today; this could help expalin some of her urgency with change from sitting to standing. We discussed performing pelvic floor muscle contraction during this transition and then urge drill once she stands. We clarified strengthening HEP and what to perform as needed. She agrees to our new plan and also reports that she will use finger vaginally to help improve her sensation of performing pelvic floor muscle contraction. She will continue to benefit from skilled PT intervention in order to decrease urinary incontinence and urgency, improve pressure management, and begin/progress functional strengthening program.   OBJECTIVE IMPAIRMENTS: decreased activity tolerance, decreased coordination, decreased endurance, decreased mobility, decreased strength, increased fascial restrictions, increased muscle spasms, impaired tone, postural dysfunction, and pain.   ACTIVITY LIMITATIONS: continence  PARTICIPATION LIMITATIONS: community activity  PERSONAL FACTORS:  1-2 comorbidities: medical history  are also affecting patient's functional outcome.   REHAB POTENTIAL:  Good  CLINICAL DECISION MAKING: Evolving/moderate complexity  EVALUATION COMPLEXITY: Moderate   GOALS: Goals reviewed with patient? Yes  SHORT TERM GOALS: Target date: 03/07/2024 - updated 04/01/2024   Pt will be independent with HEP.   Baseline: Goal status: MET 04/01/24  2.  Pt will be independent with the knack, urge suppression technique, and double voiding in order to improve bladder habits and decrease urinary incontinence.   Baseline:  Goal status: MET 04/01/24  3.  Pt will be able to correctly perform diaphragmatic breathing and appropriate pressure management in order to prevent worsening vaginal wall laxity and improve pelvic floor A/ROM.   Baseline:  Goal status: IN PROGRESS 04/01/24  4.  Pt will report 25% improvement in urinary incontinence.  Baseline:  Goal status: IN PROGRESS 04/01/24  5.  Pt will improve pelvic floor muscle strength to consistently 2/5 with appropriate coordination.  Baseline:  Goal status: IN PROGRESS 04/01/24   LONG TERM GOALS: Target date: 05/02/24 - updated 04/01/24  Pt will be independent with advanced HEP.   Baseline:  Goal status: IN PROGRESS 04/01/24  2.  Pt will report 75% improvement in urinary incontinence.  Baseline:  Goal status: IN PROGRESS 04/01/24  3.  Pt will demonstrate consistent 3/5 pelvic floor muscle strength with appropriate coordination.  Baseline:  Goal status: IN PROGRESS 04/01/24  4.  Pt will report no leaks with laughing, coughing, sneezing in order to improve comfort with interpersonal relationships and community activities.   Baseline:  Goal status: IN PROGRESS 04/01/24  5.  Pt will feel confident not using panty liner underwear.  Baseline:  Goal status: IN PROGRESS 04/01/24  6.  Pt will increase pelvic floor muscle endurance to greater than 10 seconds.  Baseline:  Goal status: IN PROGRESS 04/01/24  PLAN:  PT FREQUENCY: 1-2x/week  PT DURATION: 12 weeks  PLANNED INTERVENTIONS: 97110-Therapeutic exercises, 97530-  Therapeutic activity, 97112- Neuromuscular re-education, 97535- Self Care, [id number]- Manual therapy, Dry Needling, and Biofeedback  PLAN FOR NEXT SESSION: Progress core training; mobility activities. 1 more visits.    Verlena Glenn, PT, DPT04/24/251:12 PM

## 2024-04-23 ENCOUNTER — Other Ambulatory Visit (HOSPITAL_BASED_OUTPATIENT_CLINIC_OR_DEPARTMENT_OTHER): Payer: Self-pay

## 2024-04-23 ENCOUNTER — Ambulatory Visit: Payer: Medicare Other

## 2024-04-23 DIAGNOSIS — E559 Vitamin D deficiency, unspecified: Secondary | ICD-10-CM | POA: Diagnosis not present

## 2024-04-23 DIAGNOSIS — E782 Mixed hyperlipidemia: Secondary | ICD-10-CM | POA: Diagnosis not present

## 2024-04-23 DIAGNOSIS — Z789 Other specified health status: Secondary | ICD-10-CM | POA: Diagnosis not present

## 2024-04-23 DIAGNOSIS — R69 Illness, unspecified: Secondary | ICD-10-CM | POA: Diagnosis not present

## 2024-04-23 DIAGNOSIS — E039 Hypothyroidism, unspecified: Secondary | ICD-10-CM | POA: Diagnosis not present

## 2024-04-23 MED ORDER — SYNTHROID 112 MCG PO TABS
112.0000 ug | ORAL_TABLET | ORAL | 1 refills | Status: DC
  Filled 2024-04-23: qty 17, 30d supply, fill #0
  Filled 2024-05-17: qty 17, 30d supply, fill #1
  Filled 2024-06-17 – 2024-07-09 (×3): qty 17, 30d supply, fill #2

## 2024-04-23 MED ORDER — SYNTHROID 125 MCG PO TABS
125.0000 ug | ORAL_TABLET | ORAL | 2 refills | Status: DC
  Filled 2024-04-23: qty 13, 30d supply, fill #0
  Filled 2024-04-24: qty 40, 90d supply, fill #0
  Filled 2024-04-25: qty 39, 90d supply, fill #0
  Filled 2024-05-06: qty 38, 88d supply, fill #0
  Filled 2024-06-24 – 2024-07-09 (×2): qty 38, 88d supply, fill #1

## 2024-04-24 ENCOUNTER — Other Ambulatory Visit (HOSPITAL_BASED_OUTPATIENT_CLINIC_OR_DEPARTMENT_OTHER): Payer: Self-pay

## 2024-04-25 ENCOUNTER — Other Ambulatory Visit (HOSPITAL_BASED_OUTPATIENT_CLINIC_OR_DEPARTMENT_OTHER): Payer: Self-pay

## 2024-04-25 MED ORDER — PROGESTERONE MICRONIZED 100 MG PO CAPS
100.0000 mg | ORAL_CAPSULE | Freq: Every day | ORAL | 3 refills | Status: DC
Start: 2024-04-25 — End: 2024-10-16
  Filled 2024-04-25: qty 90, 45d supply, fill #0
  Filled 2024-06-08 (×2): qty 90, 45d supply, fill #1
  Filled 2024-07-18: qty 90, 45d supply, fill #2
  Filled 2024-09-02: qty 90, 45d supply, fill #3

## 2024-05-06 ENCOUNTER — Other Ambulatory Visit: Payer: Self-pay

## 2024-05-06 ENCOUNTER — Other Ambulatory Visit (HOSPITAL_BASED_OUTPATIENT_CLINIC_OR_DEPARTMENT_OTHER): Payer: Self-pay | Admitting: Family Medicine

## 2024-05-06 DIAGNOSIS — E063 Autoimmune thyroiditis: Secondary | ICD-10-CM

## 2024-05-15 ENCOUNTER — Other Ambulatory Visit (HOSPITAL_BASED_OUTPATIENT_CLINIC_OR_DEPARTMENT_OTHER): Payer: Self-pay

## 2024-05-16 ENCOUNTER — Other Ambulatory Visit (HOSPITAL_BASED_OUTPATIENT_CLINIC_OR_DEPARTMENT_OTHER): Payer: Self-pay

## 2024-05-16 DIAGNOSIS — B880 Other acariasis: Secondary | ICD-10-CM | POA: Diagnosis not present

## 2024-05-16 DIAGNOSIS — H16223 Keratoconjunctivitis sicca, not specified as Sjogren's, bilateral: Secondary | ICD-10-CM | POA: Diagnosis not present

## 2024-05-16 MED ORDER — ALPRAZOLAM 0.25 MG PO TABS
0.2500 mg | ORAL_TABLET | Freq: Every day | ORAL | 2 refills | Status: AC | PRN
  Filled 2024-05-16: qty 60, 30d supply, fill #0
  Filled 2024-10-23: qty 60, 30d supply, fill #1

## 2024-05-21 ENCOUNTER — Other Ambulatory Visit (HOSPITAL_BASED_OUTPATIENT_CLINIC_OR_DEPARTMENT_OTHER): Payer: Self-pay

## 2024-05-21 DIAGNOSIS — K589 Irritable 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: Secondary | ICD-10-CM | POA: Diagnosis not present

## 2024-05-21 DIAGNOSIS — R69 Illness, unspecified: Secondary | ICD-10-CM | POA: Diagnosis not present

## 2024-05-21 DIAGNOSIS — F411 Generalized anxiety disorder: Secondary | ICD-10-CM | POA: Diagnosis not present

## 2024-05-21 DIAGNOSIS — F331 Major depressive disorder, recurrent, moderate: Secondary | ICD-10-CM | POA: Diagnosis not present

## 2024-05-21 DIAGNOSIS — G4700 Insomnia, unspecified: Secondary | ICD-10-CM | POA: Diagnosis not present

## 2024-05-21 MED ORDER — BUPROPION HCL ER (XL) 150 MG PO TB24
150.0000 mg | ORAL_TABLET | Freq: Every morning | ORAL | 3 refills | Status: DC
  Filled 2024-05-21 – 2024-05-23 (×2): qty 90, 90d supply, fill #0

## 2024-05-21 MED ORDER — LINZESS 72 MCG PO CAPS
72.0000 ug | ORAL_CAPSULE | Freq: Every morning | ORAL | 1 refills | Status: DC
  Filled 2024-05-21 – 2024-05-23 (×2): qty 90, 90d supply, fill #0

## 2024-05-22 ENCOUNTER — Other Ambulatory Visit (HOSPITAL_BASED_OUTPATIENT_CLINIC_OR_DEPARTMENT_OTHER): Payer: Self-pay

## 2024-05-23 ENCOUNTER — Other Ambulatory Visit (HOSPITAL_BASED_OUTPATIENT_CLINIC_OR_DEPARTMENT_OTHER): Payer: Self-pay

## 2024-05-27 ENCOUNTER — Other Ambulatory Visit (HOSPITAL_BASED_OUTPATIENT_CLINIC_OR_DEPARTMENT_OTHER): Payer: Self-pay

## 2024-05-27 ENCOUNTER — Ambulatory Visit: Admitting: Family Medicine

## 2024-05-29 ENCOUNTER — Other Ambulatory Visit (HOSPITAL_BASED_OUTPATIENT_CLINIC_OR_DEPARTMENT_OTHER): Payer: Self-pay

## 2024-05-29 MED ORDER — VALACYCLOVIR HCL 500 MG PO TABS
500.0000 mg | ORAL_TABLET | Freq: Two times a day (BID) | ORAL | 2 refills | Status: AC
  Filled 2024-05-29: qty 102, 51d supply, fill #0
  Filled 2024-05-29: qty 78, 39d supply, fill #0
  Filled 2024-06-03 (×2): qty 180, 90d supply, fill #0
  Filled 2024-08-27: qty 180, 90d supply, fill #1
  Filled 2024-11-25: qty 180, 90d supply, fill #2

## 2024-05-29 MED ORDER — ESTRADIOL 0.025 MG/24HR TD PTTW
1.0000 | MEDICATED_PATCH | TRANSDERMAL | 1 refills | Status: DC
  Filled 2024-05-29: qty 16, 56d supply, fill #0
  Filled 2024-07-22: qty 16, 56d supply, fill #1

## 2024-06-03 ENCOUNTER — Other Ambulatory Visit (HOSPITAL_BASED_OUTPATIENT_CLINIC_OR_DEPARTMENT_OTHER): Payer: Self-pay

## 2024-06-04 ENCOUNTER — Other Ambulatory Visit (HOSPITAL_BASED_OUTPATIENT_CLINIC_OR_DEPARTMENT_OTHER): Payer: Self-pay

## 2024-06-04 ENCOUNTER — Ambulatory Visit: Admitting: Family Medicine

## 2024-06-05 ENCOUNTER — Other Ambulatory Visit (HOSPITAL_BASED_OUTPATIENT_CLINIC_OR_DEPARTMENT_OTHER): Payer: Self-pay

## 2024-06-08 ENCOUNTER — Other Ambulatory Visit (HOSPITAL_BASED_OUTPATIENT_CLINIC_OR_DEPARTMENT_OTHER): Payer: Self-pay

## 2024-06-10 ENCOUNTER — Ambulatory Visit: Attending: Urology

## 2024-06-10 DIAGNOSIS — R293 Abnormal posture: Secondary | ICD-10-CM | POA: Insufficient documentation

## 2024-06-10 DIAGNOSIS — M6281 Muscle weakness (generalized): Secondary | ICD-10-CM | POA: Diagnosis not present

## 2024-06-10 DIAGNOSIS — R279 Unspecified lack of coordination: Secondary | ICD-10-CM | POA: Diagnosis not present

## 2024-06-10 NOTE — Therapy (Signed)
 OUTPATIENT PHYSICAL THERAPY FEMALE PELVIC TREATMENT   Patient Name: Wendy Joyce MRN: [id number] DOB:Dec 11, 1946, 78 y.o., female Today's Date: 06/10/2024  END OF SESSION:  PT End of Session - 06/10/24 1233     Visit Number 6    Date for PT Re-Evaluation 06/17/24    Authorization Type [employer] Medicare    Progress Note Due on Visit 10    PT Start Time 1231    PT Stop Time 1310    PT Time Calculation (min) 39 min              Past Medical History:  Diagnosis Date   Abdominal pain, generalized 01/29/2015   Abnormal thyroid  function test 02/25/2016   ADHD (attention deficit hyperactivity disorder)    Allergic rhinitis, unspecified 01/29/2015   Allergy     Anemia    DOE   Ankle sprain 03/16/2016   Anxiety 01/29/2015   Arthritis    hands   Chronic kidney disease 2012   kidney stones   Constipation 01/29/2015   Decreased libido 05/02/2018   Disorder of the skin and subcutaneous tissue, unspecified 01/29/2015   DJD (degenerative joint disease)    hand   Dysthymia    Endometriosis    External otitis    Family history of coronary artery disease 05/02/2018   Fatigue due to excessive exertion 02/24/2016   Fever blister    Fibromyalgia 01/29/2015   Frequency of micturition 01/29/2015   Functional dyspepsia 01/29/2015   Gastro-esophageal reflux disease without esophagitis 01/29/2015   Headache(784.0)    High cholesterol    Hyperlipidemia 09/12/2016   Hypersomnia    Hypothyroidism    IBS (irritable bowel syndrome) 05/02/2018   Insomnia    Internal hemorrhoids    Irritable 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 01/29/2015   Labial pain 11/30/2016   Lactose intolerance 12/14/2016   Lactose intolerance in adult 09/12/2016   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   Left ureteral stone    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   Major depressive disorder, single episode, unspecified    Migraine with aura, not intractable, without status migrainosus    Mixed hyperlipidemia     Mouth pain 01/07/2016   Osteoarthritis of hip    Osteopenia determined by x-ray 03/02/2017   Personal history of estrogen therapy    Phlebitis and thrombophlebitis of the leg    Pityriasis alba 01/29/2015   Pure hypercholesterolemia 06/29/2016   Rectum pain 09/12/2016   Recurrent sinus infections 02/24/2016   Rhinosinusitis 01/07/2016   Rosacea    Sinusitis 12/24/2015   Tinnitus of left ear 01/29/2015   Umbilical hernia without obstruction and without gangrene 01/29/2015   Umbilical pain 05/02/2018   Unspecified urinary incontinence 01/07/2016   Urticaria    UTI (urinary tract infection)    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   Vitamin D  deficiency    Past Surgical History:  Procedure Laterality Date   BREAST ENHANCEMENT SURGERY     BREAST REDUCTION SURGERY Bilateral 2018   COLONOSCOPY     COLONOSCOPY WITH PROPOFOL  N/A 02/25/2013   Procedure: COLONOSCOPY WITH PROPOFOL ;  Surgeon: Garrett Kallman, MD;  Location: [facility];  Service: Endoscopy;  Laterality: N/A;   left hand surgery  12/28/2022   mini facelift  2000   NASAL SINUS SURGERY     REDUCTION MAMMAPLASTY     ROTATOR CUFF REPAIR     Right   ROTATOR CUFF REPAIR     Left   SEPTOPLASTY WITH ETHMOIDECTOMY, AND MAXILLARY  ANTROSTOMY Bilateral 2006   TOTAL ABDOMINAL HYSTERECTOMY     ovaries remain   TUBAL LIGATION     umbicial hernia     Patient Active Problem List   Diagnosis Date Noted   Post concussive syndrome 01/18/2024   Bradycardia, sinus 11/15/2023   Primary insomnia 11/15/2023   Right lateral abdominal pain 05/17/2023   RUQ abdominal pain 04/12/2023   Depression, recurrent (HCC) 12/25/2022   Preoperative clearance 11/23/2022   Encounter for counseling 10/14/2022   Influenza 09/18/2022   Pain of left hand 06/29/2022   Vasomotor symptoms due to menopause 05/30/2022   Pre-diabetes 05/30/2022   Anxiety disorder 03/17/2022   Allergic rhinitis 03/17/2022    Constipation 03/17/2022   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   Exertional dyspnea 03/17/2022   Family history of coronary artery disease 03/17/2022   Weakness with dizziness 03/17/2022   Fever blister 03/17/2022   Frequency of micturition 03/17/2022   Irritable bowel syndrome 03/17/2022   Lactose intolerance 03/17/2022   Left anterior fascicular block 03/17/2022   Lichen planus 03/17/2022   Panniculitis 03/17/2022   Refractory migraine with aura 03/17/2022   Statin not tolerated 03/17/2022   Thrombophlebitis of superficial veins of lower extremity 03/17/2022   Bilateral tinnitus 03/17/2022   Umbilical hernia 03/17/2022   Urinary incontinence 03/17/2022   Urticaria due to drug allergy  03/17/2022   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 03/17/2022   Sexual dysfunction 03/17/2022   Imbalance 03/17/2022   Fibromyalgia 09/21/2021   Hyperlipidemia 09/21/2021   Hashimoto's thyroiditis 03/31/2021   Hypothyroidism 09/27/2013    PCP: Wilhelmena Hanson, FNP  REFERRING PROVIDER: Sharlyne Deans, MD  REFERRING DIAG: R39.15 (ICD-10-CM) - Urgency of urination N39.3 (ICD-10-CM) - Stress incontinence (female) (female)  THERAPY DIAG:  Muscle weakness (generalized) - Plan: PT plan of care cert/re-cert  Unspecified lack of coordination - Plan: PT plan of care cert/re-cert  Abnormal posture - Plan: PT plan of care cert/re-cert  Rationale for Evaluation and Treatment: Rehabilitation  ONSET DATE: 09/19/23  SUBJECTIVE:                                                                                                                                                                                           SUBJECTIVE STATEMENT: Pt did very well on her trip, only waking about 1x/night. She states that since she returned she has been waking 3x/night. She has done some of her exercises, but states that she has not been able to be regular. She would like to go ahead and discharge  today and feels like she has the tools to get back to where she was.   PAIN:  Are you having pain? No   PRECAUTIONS: None  RED FLAGS: None   WEIGHT BEARING RESTRICTIONS: No  FALLS:  Has patient fallen in last 6 months? No  OCCUPATION: retired; working as an Tree surgeon  ACTIVITY LEVEL : works out with Psychologist, educational 1x/week; treadmill daily; stretching   PLOF: Independent  PATIENT GOALS: stop wearing panty liner underwear  PERTINENT HISTORY:  Abdominal hysterectomy, tubal ligation; umbilical hernia, endometriosis, fibromyalgia, GERD, IBS, labial pain, lichens planus, anxiety/depression, fissures  BOWEL MOVEMENT: Pain with bowel movement: No Type of bowel movement:1x/day Fully empty rectum: Yes: - Leakage: No Pads: No Fiber supplement/laative No  URINATION: Pain with urination: No Fully empty bladder: No Stream: Weak Urgency: Yes: only if she has waited too long, but much better since surgery  Frequency: every hour when active; nocturia 1x/night with slow emptying Leakage: Coughing, Sneezing, Laughing, Exercise, and fast walking; sit>stand Pads: Yes: panty liner underwear - would like to get rid of   INTERCOURSE:  Ability to have vaginal penetration Yes.   Pain with intercourse: discomfort DrynessYes; not using lubricant Climax: WNL Marinoff Scale: 1/3  PREGNANCY: Vaginal deliveries 2 Tearing Yes: - Episiotomy No C-section deliveries 0 Currently pregnant No  PROLAPSE: None   OBJECTIVE:  Note: Objective measures were completed at Evaluation unless otherwise noted.  04/18/24               Internal Pelvic Floor: tenderness at posterior fourchette  Patient confirms identification and approves PT to assess internal pelvic floor and treatment Yes  PELVIC MMT:   MMT eval  Vaginal 1/5, 2 second hold, 2 repeat contractions; not achieving pelvic floor muscle contraction on her own without cuing  (Blank rows = not tested)        TONE: low  PROLAPSE: Palpated  minor anterior vaginal wall laxity in supine today  02/08/24  COGNITION: Overall cognitive status: Within functional limits for tasks assessed     SENSATION: Light touch: Appears intact   FUNCTIONAL TESTS:  Squat: WNL - some wobbling Single leg stance:  Rt: pelvic drop  Lt: pelvic drop Curl-up test: no diastasis; lower abdominal distortion   GAIT: Assistive device utilized: None Comments: WNL  POSTURE: rounded shoulders, forward head, decreased lumbar lordosis, decreased thoracic kyphosis, posterior pelvic tilt, and right pelvic obliquity   LUMBARAROM/PROM:  A/PROM A/PROM  Eval (% available)  Flexion 50  Extension 25  Right lateral flexion 50  Left lateral flexion 50  Right rotation 50  Left rotation 50   (Blank rows = not tested)  PALPATION:   General: restriction throughout thoracic and lumbar spine  Pelvic Alignment: Rt posterior rotation  Abdominal: scar tissue restriction; tightness in upper right quadrant with tenderness; decreased lower rib cage mobility                External Perineal Exam: some increase in redness                             Internal Pelvic Floor: tenderness at posterior fourchette  Patient confirms identification and approves PT to assess internal pelvic floor and treatment Yes  PELVIC MMT:   MMT eval  Vaginal 1-2/5, 2 second hold, 6 repeat contractions, but bearing down instead of active contraction; very hard time with consistent coordination  Diastasis Recti No diastasis; some lower abdominal distortion  (Blank rows = not tested)        TONE: low  PROLAPSE: WNL in supine  TODAY'S TREATMENT:  DATE:  06/10/24 Neuromuscular re-education: Seated pelvic tilts 10x  Supine pelvic tilts 10x Bridge with hip adduction, transversus abdominus, and pelvic floor muscle 2 x 10 Bridge with hip abduction red  band Seated hip abduction red band with transversus abdominus and pelvic floor muscle 2 x 10 Therapeutic activities: Squats to table 2 x 10 Pallof press red band 10x bil Shoulder extensions red band 2 x 10 3-way kick 10x each, bil   04/18/24 Manual: Pt provides verbal consent for internal vaginal/rectal pelvic floor exam. Internal pelvic floor muscle reassessment Neuromuscular re-education: Internal vaginal pelvic floor muscle contraction training Quick flicks Urge drill Therapeutic activities: Discussing how to use urge drill functionally with standing to help decrease trips to the bathroom and urge with putting bladder in gravity dependent position Difference between strengthening vs as needed pelvic floor muscle contractions    04/11/24 Neuromuscular re-education: Seated hip abduction green band with transversus abdominus and pelvic floor muscle 2 x 10 Seated resisted rotation green band 10x bil Seated horizontal abduction green band 2 x 10 Seated unilateral hip abduction green band 10x bil Exercises: Seated hamstring stretch 60 seconds bil Seated piriformis stretch 60 seconds bil Therapeutic activities: Squats to table 2 x 10 Pallof press red band 10x bil Shoulder extensions red band 2 x 10 3-way kick 10x each, bil     PATIENT EDUCATION:  Education details: See above  Person educated: Patient Education method: Explanation, Demonstration, Tactile cues, Verbal cues, and Handouts Education comprehension: verbalized understanding  HOME EXERCISE PROGRAM: [id number]  ASSESSMENT:  CLINICAL IMPRESSION: Pt was doing very well with nocturia and improved bladder control. Since coming home from her trip to [place], she has had more difficulty with nocturia. However, she has not been able to work on exercises and reports being very busy since returning; this could help explain the increase in symptoms. Due to schedule and having made great progress previously, she would like to  try and return to her exercises on her own and see if she can get back to the progress she had seen. Due to patient request, we are discharging today; she was encouraged to call with any questions or concerns.  OBJECTIVE IMPAIRMENTS: decreased activity tolerance, decreased coordination, decreased endurance, decreased mobility, decreased strength, increased fascial restrictions, increased muscle spasms, impaired tone, postural dysfunction, and pain.   ACTIVITY LIMITATIONS: continence  PARTICIPATION LIMITATIONS: community activity  PERSONAL FACTORS: 1-2 comorbidities: medical history are also affecting patient's functional outcome.   REHAB POTENTIAL: Good  CLINICAL DECISION MAKING: Evolving/moderate complexity  EVALUATION COMPLEXITY: Moderate   GOALS: Goals reviewed with patient? Yes  SHORT TERM GOALS: Updated 06/10/24   Pt will be independent with HEP.   Baseline: Goal status: MET 04/01/24  2.  Pt will be independent with the knack, urge suppression technique, and double voiding in order to improve bladder habits and decrease urinary incontinence.   Baseline:  Goal status: MET 04/01/24  3.  Pt will be able to correctly perform diaphragmatic breathing and appropriate pressure management in order to prevent worsening vaginal wall laxity and improve pelvic floor A/ROM.   Baseline:  Goal status: MET 06/10/24  4.  Pt will report 25% improvement in urinary incontinence.  Baseline:  Goal status: MET 06/10/24  5.  Pt will improve pelvic floor muscle strength to consistently 2/5 with appropriate coordination.  Baseline:  Goal status: DISCHARGED 06/10/24   LONG TERM GOALS: Updated 06/10/24  Pt will be independent with advanced HEP.   Baseline:  Goal status:  MET 06/10/24  2.  Pt will report 75% improvement in urinary incontinence.  Baseline:  Goal status: DISCHARGED 06/10/24  3.  Pt will demonstrate consistent 3/5 pelvic floor muscle strength with appropriate coordination.   Baseline:  Goal status: DISCHARGED 06/10/24  4.  Pt will report no leaks with laughing, coughing, sneezing in order to improve comfort with interpersonal relationships and community activities.   Baseline:  Goal status: MET 06/10/24  5.  Pt will feel confident not using panty liner underwear.  Baseline:  Goal status: IN PROGRESS 04/01/24  6.  Pt will increase pelvic floor muscle endurance to greater than 10 seconds.  Baseline:  Goal status: DISCHARGED 06/10/24  PLAN:  PT FREQUENCY: -  PT DURATION: -  PLANNED INTERVENTIONS: -  PLAN FOR NEXT SESSION: D/C  PHYSICAL THERAPY DISCHARGE SUMMARY  Visits from Start of Care: 6  Current functional level related to goals / functional outcomes: Independent   Remaining deficits: See above   Education / Equipment: HEP   Patient agrees to discharge. Patient goals were partially met. Patient is being discharged due to the patient's request.  Verlena Glenn, PT, DPT06/16/251:23 PM

## 2024-06-12 ENCOUNTER — Other Ambulatory Visit (HOSPITAL_BASED_OUTPATIENT_CLINIC_OR_DEPARTMENT_OTHER): Payer: Self-pay

## 2024-06-17 ENCOUNTER — Other Ambulatory Visit (HOSPITAL_BASED_OUTPATIENT_CLINIC_OR_DEPARTMENT_OTHER): Payer: Self-pay

## 2024-06-24 ENCOUNTER — Other Ambulatory Visit (HOSPITAL_BASED_OUTPATIENT_CLINIC_OR_DEPARTMENT_OTHER): Payer: Self-pay

## 2024-06-29 ENCOUNTER — Other Ambulatory Visit (HOSPITAL_BASED_OUTPATIENT_CLINIC_OR_DEPARTMENT_OTHER): Payer: Self-pay

## 2024-07-04 NOTE — Progress Notes (Signed)
 [facility] - Clinic Note  07/09/2024   CHIEF COMPLAINT Patient presents for Retina Follow Up  HISTORY OF PRESENT ILLNESS: Wendy Joyce is a 78 y.o. 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  This started 9 months ago.  Severity is moderate.  Duration of 9 months.  Since onset it is stable.  I, the attending physician,  performed the HPI with the patient and updated documentation appropriately.        Comments   Pt states no changes in vision. Pt denies FOL/floaters. Pt has begun treatment for extreme dry eye. Pt has had one IPL treatment and 2 Oculift treatments.      Last edited by Valdemar Rogue, MD on 07/12/2024 12:49 PM.    Pt states she is having a little blurriness when watching TV, but she thinks it's due to dry eye, she is using AT's    Referring physician: Frazier, [place], OD [address] [place],  [place] [address]  HISTORICAL INFORMATION:  Selected notes from the MEDICAL RECORD NUMBER Referred by Dr. Vivian for choroidal nevus OS LEE:  Ocular Hx- PMH-   CURRENT MEDICATIONS: Current Outpatient Medications (Ophthalmic Drugs)  Medication Sig   cycloSPORINE  (RESTASIS ) 0.05 % ophthalmic emulsion Place 1 drop into both eyes 2 (two) times daily.   erythromycin  ophthalmic ointment Place small amount into both eyes at bedtime.   Lifitegrast  (XIIDRA ) 5 % SOLN Instill 1 drop into both eyes twice a day   neomycin -polymyxin-dexameth (MAXITROL ) 0.1 % OINT Apply a small amount on eyelid twice a day   No current facility-administered medications for this visit. (Ophthalmic Drugs)   Current Outpatient Medications (Other)  Medication Sig   acyclovir  ointment (ZOVIRAX ) 5 % Apply 1 application. topically daily as needed (for fever blister).   albuterol  (VENTOLIN  HFA) 108 (90 Base) MCG/ACT inhaler Inhale 2 puffs into the lungs every 6 (six) hours as needed for wheezing  or shortness of breath.   ALPRAZolam  (XANAX ) 0.25 MG tablet Take 1-2 tablets (0.25-0.5 mg total) by mouth daily as needed.   atomoxetine  (STRATTERA ) 10 MG capsule Take 1 capsule (10 mg total) by mouth daily.   buPROPion  (WELLBUTRIN  XL) 150 MG 24 hr tablet Take 1 tablet (150 mg total) by mouth in the morning.   cetirizine (ZYRTEC) 10 MG tablet Take 10 mg by mouth daily.   clindamycin  (CLEOCIN ) 300 MG capsule Take 1 capsule (300 mg total) by mouth 3 (three) times daily.   COVID-19 mRNA vaccine, Pfizer, (COMIRNATY ) syringe Inject into the muscle.   estradiol  (ESTRACE ) 0.1 MG/GM vaginal cream Insert blueberry size amount of cream on finger (or 0.5 grams with applicator) in vagina daily for 2 weeks, THEN twice weekly.   estradiol  (VIVELLE -DOT) 0.025 MG/24HR Place 1 patch onto the skin 2 (two) times a week.   levothyroxine  (SYNTHROID ) 125 MCG tablet Take 1 tablet (125 mcg total) by mouth daily. Take in the morning on an empty stomach, wait 1 hour to have food or coffee.   linaclotide  (LINZESS ) 72 MCG capsule Take 1 capsule (72 mcg total) by mouth in the morning at least 30 minutes before the first meal of the day on an empty stomach.   metFORMIN  (GLUCOPHAGE ) 500 MG tablet Take 0.5 tablet by mouth daily for 7 days, THEN 0.5 tablet twice daily for 7 days, THEN 1 tablet in AM and 0.5 tablet in  PM for 7 days, THEN 1 tablet twice daily.   montelukast  (SINGULAIR ) 10 MG tablet Take 1 tablet (10 mg total) by mouth daily for allergies   Naltrexone  HCl, Pain, 4.5 MG CAPS Prescribed by psychiatry for depression. Once a day.   ondansetron  (ZOFRAN -ODT) 4 MG disintegrating tablet place 1 tablet (4 mg total) under the tongur every 6 (six) hours as needed for nausea   oxyCODONE  (OXY IR/ROXICODONE ) 5 MG immediate release tablet Take 1 tablet (5 mg total) by mouth every 4 (four) to 6 (six) hours as needed for 5 days.   progesterone  (PROMETRIUM ) 100 MG capsule Take 1 capsule (100 mg total) by mouth before bedtime for 1 week.  May increase to 2 capsules to help with sleep.   SYNTHROID  112 MCG tablet Take 1 tablet (112 mcg total) by mouth every Tuesday, Thursday, Saturday, and Sunday morning on an empty stomach   SYNTHROID  125 MCG tablet Take 1 tablet (125 mcg total) by mouth every Monday, Wednesday, and Friday.   traZODone  (DESYREL ) 50 MG tablet Take ONE-QUARTER to 1 tablet (12.5-50 mg total) by mouth at bedtime.   valACYclovir  (VALTREX ) 500 MG tablet Take 1 tablet (500 mg total) by mouth daily.   valACYclovir  (VALTREX ) 500 MG tablet Take 1 tablet (500 mg total) by mouth 2 (two) times daily.   zaleplon  (SONATA ) 10 MG capsule Take 1 capsule (10 mg total) by mouth at bedtime as needed for sleep.   No current facility-administered medications for this visit. (Other)   REVIEW OF SYSTEMS: ROS   Positive for: Gastrointestinal, Neurological, Eyes, Respiratory Negative for: Constitutional Last edited by Elnor Avelina RAMAN, COT on 07/09/2024 10:20 AM.      ALLERGIES Allergies  Allergen Reactions   Adhesive [Tape] Dermatitis   Atovaquone-Proguanil Hcl Other (See Comments)   Bioflavonoids    Citrus Nausea And Vomiting   Emedastine Nausea And Vomiting   Escitalopram Hives   Escitalopram Oxalate Hives   Ezetimibe-Simvastatin Other (See Comments)   Niacin Other (See Comments)   Niacin And Related     Rash and breathing breathing problems   Rosuvastatin Other (See Comments)   Versed  [Midazolam ] Nausea And Vomiting    For a week per pt   Amoxicillin Rash    Unsure of reaction/ was instructed not to take drug. Unknown reaction    Hydrochlorothiazide Rash   PAST MEDICAL HISTORY Past Medical History:  Diagnosis Date   Abdominal pain, generalized 01/29/2015   Abnormal thyroid  function test 02/25/2016   ADHD (attention deficit hyperactivity disorder)    Allergic rhinitis, unspecified 01/29/2015   Allergy     Anemia    DOE   Ankle sprain 03/16/2016   Anxiety 01/29/2015   Arthritis    hands   Chronic kidney  disease 2012   kidney stones   Constipation 01/29/2015   Decreased libido 05/02/2018   Disorder of the skin and subcutaneous tissue, unspecified 01/29/2015   DJD (degenerative joint disease)    hand   Dysthymia    Endometriosis    External otitis    Family history of coronary artery disease 05/02/2018   Fatigue due to excessive exertion 02/24/2016   Fever blister    Fibromyalgia 01/29/2015   Frequency of micturition 01/29/2015   Functional dyspepsia 01/29/2015   Gastro-esophageal reflux disease without esophagitis 01/29/2015   Headache(784.0)    High cholesterol    Hyperlipidemia 09/12/2016   Hypersomnia    Hypothyroidism    IBS (irritable bowel syndrome) 05/02/2018   Insomnia  Internal hemorrhoids    Irritable 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 01/29/2015   Labial pain 11/30/2016   Lactose intolerance 12/14/2016   Lactose intolerance in adult 09/12/2016   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   Left ureteral stone    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   Major depressive disorder, single episode, unspecified    Migraine with aura, not intractable, without status migrainosus    Mixed hyperlipidemia    Mouth pain 01/07/2016   Osteoarthritis of hip    Osteopenia determined by x-ray 03/02/2017   Personal history of estrogen therapy    Phlebitis and thrombophlebitis of the leg    Pityriasis alba 01/29/2015   Pure hypercholesterolemia 06/29/2016   Rectum pain 09/12/2016   Recurrent sinus infections 02/24/2016   Rhinosinusitis 01/07/2016   Rosacea    Sinusitis 12/24/2015   Tinnitus of left ear 01/29/2015   Umbilical hernia without obstruction and without gangrene 01/29/2015   Umbilical pain 05/02/2018   Unspecified urinary incontinence 01/07/2016   Urticaria    UTI (urinary tract infection)    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   Vitamin D  deficiency    Past Surgical History:  Procedure Laterality Date   BREAST ENHANCEMENT  SURGERY     BREAST REDUCTION SURGERY Bilateral 2018   COLONOSCOPY     COLONOSCOPY WITH PROPOFOL  N/A 02/25/2013   Procedure: COLONOSCOPY WITH PROPOFOL ;  Surgeon: Gladis MARLA Louder, MD;  Location: [facility];  Service: Endoscopy;  Laterality: N/A;   left hand surgery  12/28/2022   mini facelift  2000   NASAL SINUS SURGERY     REDUCTION MAMMAPLASTY     ROTATOR CUFF REPAIR     Right   ROTATOR CUFF REPAIR     Left   SEPTOPLASTY WITH ETHMOIDECTOMY, AND MAXILLARY ANTROSTOMY Bilateral 2006   TOTAL ABDOMINAL HYSTERECTOMY     ovaries remain   TUBAL LIGATION     umbicial hernia     FAMILY HISTORY Family History  Problem Relation Age of Onset   Stroke Father    Hypertension Father    Cancer Father    Breast cancer Maternal Grandmother    Colon cancer Neg Hx    Colon polyps Neg Hx    Esophageal cancer Neg Hx    Rectal cancer Neg Hx    Stomach cancer Neg Hx    SOCIAL HISTORY Social History   Tobacco Use   Smoking status: Never   Smokeless tobacco: Never  Vaping Use   Vaping status: Never Used  Substance Use Topics   Alcohol  use: Yes    Alcohol /week: 2.0 standard drinks of alcohol     Types: 2 Glasses of wine per week    Comment: weekends occ   Drug use: No       OPHTHALMIC EXAM:  Base Eye Exam     Visual Acuity (Snellen - Linear)       Right Left   Dist [place] 20/20 20/25 -2   Dist ph [place]  20/20 -2         Tonometry (Tonopen, 10:13 AM)       Right Left   Pressure 13 14         Pupils       Pupils Dark Light Shape React APD   Right PERRL 3 2 Round Brisk None   Left PERRL 3 2 Round Brisk None         Visual Fields       Left Right    Full  Full         Extraocular Movement       Right Left    Full Full         Neuro/Psych     Oriented x3: Yes         Dilation     Both eyes: 1.0% Mydriacyl, 2.5% Phenylephrine  @ 10:13 AM           Slit Lamp and Fundus Exam     Slit Lamp Exam       Right Left   Lids/Lashes Normal Normal    Conjunctiva/Sclera White and quiet White and quiet   Cornea mild arcus, well healed cataract wound, trace tear film debris, trace PEE mild arcus, well healed cataract wound, mild tear film debris, 1+ inferior Punctate epithelial erosions   Anterior Chamber deep and clear deep and clear   Iris Round and dilated Round and dilated   Lens PC IOL in good position with open PC PC IOL in good position with open PC   Anterior Vitreous mild syneresis, Posterior vitreous detachment, vitreous condensations mild syneresis, Posterior vitreous detachment         Fundus Exam       Right Left   Disc Pink and Sharp Pink and Sharp, Compact   C/D Ratio 0.5 0.3   Macula Flat, Blunted foveal reflex, RPE mottling, No heme or edema Flat, good foveal reflex, RPE mottling, No heme or edema   Vessels attenuated, mild copper  wiring, mild tortuosity mild attenuation, mild tortuosity   Periphery Attached, No heme Attached, pigmented choroidal lesion at 1015 midzone with minimal elevation, +drusen, no SRF or orange pigment (approx 2DD in size) -- stable from prior, No heme           IMAGING AND PROCEDURES  Imaging and Procedures for 07/09/2024  OCT, Retina - OU - Both Eyes       Right Eye Quality was good. Central Foveal Thickness: 283. Progression has been stable. Findings include normal foveal contour, no IRF, no SRF.   Left Eye Quality was good. Central Foveal Thickness: 290. Progression has been stable. Findings include normal foveal contour, no IRF, no SRF (Focal Hyper reflective choroidal lesion SN periphery -- caught on widefield).   Notes *Images captured and stored on drive  Diagnosis / Impression:  OD: NFP, no IRF/SRF OS: Focal Hyper reflective choroidal lesion SN periphery -- caught on widefield  Clinical management:  See below  Abbreviations: NFP - Normal foveal profile. CME - cystoid macular edema. PED - pigment epithelial detachment. IRF - intraretinal fluid. SRF - subretinal fluid. EZ -  ellipsoid zone. ERM - epiretinal membrane. ORA - outer retinal atrophy. ORT - outer retinal tubulation. SRHM - subretinal hyper-reflective material. IRHM - intraretinal hyper-reflective material            ASSESSMENT/PLAN:   ICD-10-CM   1. Nevus of choroid of left eye  D31.32 OCT, Retina - OU - Both Eyes    2. Pseudophakia of both eyes  Z96.1      Choroidal Nevus, OS  - ~2DD pigmented lesion at 1015 midzone  - no visual symptoms, SRF or orange pigment  - +drusen  - thickness < 2mm  - baseline Optos images obtained today, 11.12.24  - discussed findings, prognosis  - recommend monitoring  - f/u 6-9 months, DFE, OCT, optos colors  2. Pseudophakia OU  - s/p CE/IOL OU (Dr. Roz)  - IOLs in good position, doing well  - monitor  Ophthalmic Meds  Ordered this visit:  No orders of the defined types were placed in this encounter.    Return in about 1 year (around 07/09/2025) for f/u nevus OS, DFE, OCT, OPTOS colors.  There are no Patient Instructions on file for this visit.  Explained the diagnoses, plan, and follow up with the patient and they expressed understanding.  Patient expressed understanding of the importance of proper follow up care.   This document serves as a record of services personally performed by Redell JUDITHANN Hans, MD, PhD. It was created on their behalf by Auston Muzzy, COMT. The creation of this record is the provider's dictation and/or activities during the visit.  Electronically signed by: Auston Muzzy, COMT 07/12/24 12:52 PM  This document serves as a record of services personally performed by Redell JUDITHANN Hans, MD, PhD. It was created on their behalf by Alan PARAS. Delores, OA an ophthalmic technician. The creation of this record is the provider's dictation and/or activities during the visit.    Electronically signed by: Alan PARAS. Delores, OA 07/12/24 12:52 PM  Redell JUDITHANN Hans, M.D., Ph.D. Diseases & Surgery of the Retina and Vitreous [facility]  I have reviewed the above documentation for accuracy and completeness, and I agree with the above. Redell JUDITHANN Hans, M.D., Ph.D. 07/12/24 12:53 PM   Abbreviations: M myopia (nearsighted); A astigmatism; H hyperopia (farsighted); P presbyopia; Mrx spectacle prescription;  CTL contact lenses; OD right eye; OS left eye; OU both eyes  XT exotropia; ET esotropia; PEK punctate epithelial keratitis; PEE punctate epithelial erosions; DES dry eye syndrome; MGD meibomian gland dysfunction; ATs artificial tears; PFAT's preservative free artificial tears; NSC nuclear sclerotic cataract; PSC posterior subcapsular cataract; ERM epi-retinal membrane; PVD posterior vitreous detachment; RD retinal detachment; DM diabetes mellitus; DR diabetic retinopathy; NPDR non-proliferative diabetic retinopathy; PDR proliferative diabetic retinopathy; CSME clinically significant macular edema; DME diabetic macular edema; dbh dot blot hemorrhages; CWS cotton wool spot; POAG primary open angle glaucoma; C/D cup-to-disc ratio; HVF humphrey visual field; GVF goldmann visual field; OCT optical coherence tomography; IOP intraocular pressure; BRVO Branch retinal vein occlusion; CRVO central retinal vein occlusion; CRAO central retinal artery occlusion; BRAO branch retinal artery occlusion; RT retinal tear; SB scleral buckle; PPV pars plana vitrectomy; VH Vitreous hemorrhage; PRP panretinal laser photocoagulation; IVK intravitreal kenalog ; VMT vitreomacular traction; MH Macular hole;  NVD neovascularization of the disc; NVE neovascularization elsewhere; AREDS age related eye disease study; ARMD age related macular degeneration; POAG primary open angle glaucoma; EBMD epithelial/anterior basement membrane dystrophy; ACIOL anterior chamber intraocular lens; IOL intraocular lens; PCIOL posterior chamber intraocular lens; Phaco/IOL phacoemulsification with intraocular lens placement; PRK photorefractive keratectomy; LASIK laser assisted in situ  keratomileusis; HTN hypertension; DM diabetes mellitus; COPD chronic obstructive pulmonary disease

## 2024-07-08 DIAGNOSIS — H01005 Unspecified blepharitis left lower eyelid: Secondary | ICD-10-CM | POA: Diagnosis not present

## 2024-07-08 DIAGNOSIS — H0288B Meibomian gland dysfunction left eye, upper and lower eyelids: Secondary | ICD-10-CM | POA: Diagnosis not present

## 2024-07-08 DIAGNOSIS — H0288A Meibomian gland dysfunction right eye, upper and lower eyelids: Secondary | ICD-10-CM | POA: Diagnosis not present

## 2024-07-08 DIAGNOSIS — H16223 Keratoconjunctivitis sicca, not specified as Sjogren's, bilateral: Secondary | ICD-10-CM | POA: Diagnosis not present

## 2024-07-08 DIAGNOSIS — B880 Other acariasis: Secondary | ICD-10-CM | POA: Diagnosis not present

## 2024-07-08 DIAGNOSIS — H01001 Unspecified blepharitis right upper eyelid: Secondary | ICD-10-CM | POA: Diagnosis not present

## 2024-07-09 ENCOUNTER — Encounter (INDEPENDENT_AMBULATORY_CARE_PROVIDER_SITE_OTHER): Payer: Self-pay | Admitting: Ophthalmology

## 2024-07-09 ENCOUNTER — Other Ambulatory Visit (HOSPITAL_BASED_OUTPATIENT_CLINIC_OR_DEPARTMENT_OTHER): Payer: Self-pay | Admitting: Family Medicine

## 2024-07-09 ENCOUNTER — Ambulatory Visit (INDEPENDENT_AMBULATORY_CARE_PROVIDER_SITE_OTHER): Payer: Medicare Other | Admitting: Ophthalmology

## 2024-07-09 ENCOUNTER — Other Ambulatory Visit: Payer: Self-pay

## 2024-07-09 ENCOUNTER — Other Ambulatory Visit (HOSPITAL_BASED_OUTPATIENT_CLINIC_OR_DEPARTMENT_OTHER): Payer: Self-pay

## 2024-07-09 DIAGNOSIS — Z961 Presence of intraocular lens: Secondary | ICD-10-CM

## 2024-07-09 DIAGNOSIS — E063 Autoimmune thyroiditis: Secondary | ICD-10-CM

## 2024-07-09 DIAGNOSIS — D3132 Benign neoplasm of left choroid: Secondary | ICD-10-CM

## 2024-07-09 MED ORDER — LEVOTHYROXINE SODIUM 125 MCG PO TABS
125.0000 ug | ORAL_TABLET | Freq: Every day | ORAL | 1 refills | Status: DC
  Filled 2024-07-09: qty 90, 90d supply, fill #0

## 2024-07-10 ENCOUNTER — Other Ambulatory Visit (HOSPITAL_BASED_OUTPATIENT_CLINIC_OR_DEPARTMENT_OTHER): Payer: Self-pay

## 2024-07-11 ENCOUNTER — Other Ambulatory Visit (HOSPITAL_BASED_OUTPATIENT_CLINIC_OR_DEPARTMENT_OTHER): Payer: Self-pay

## 2024-07-12 ENCOUNTER — Encounter (INDEPENDENT_AMBULATORY_CARE_PROVIDER_SITE_OTHER): Payer: Self-pay | Admitting: Ophthalmology

## 2024-07-22 ENCOUNTER — Other Ambulatory Visit (HOSPITAL_BASED_OUTPATIENT_CLINIC_OR_DEPARTMENT_OTHER): Payer: Self-pay

## 2024-07-31 ENCOUNTER — Other Ambulatory Visit (HOSPITAL_BASED_OUTPATIENT_CLINIC_OR_DEPARTMENT_OTHER): Payer: Self-pay

## 2024-07-31 ENCOUNTER — Ambulatory Visit: Payer: Medicare PPO | Admitting: Adult Health

## 2024-07-31 ENCOUNTER — Encounter: Payer: Self-pay | Admitting: Neurology

## 2024-07-31 ENCOUNTER — Ambulatory Visit: Admitting: Neurology

## 2024-07-31 ENCOUNTER — Ambulatory Visit (INDEPENDENT_AMBULATORY_CARE_PROVIDER_SITE_OTHER): Admitting: Neurology

## 2024-07-31 VITALS — BP 132/62 | HR 64 | Ht 63.0 in | Wt 137.2 lb

## 2024-07-31 DIAGNOSIS — S0990XS Unspecified injury of head, sequela: Secondary | ICD-10-CM | POA: Diagnosis not present

## 2024-07-31 DIAGNOSIS — R42 Dizziness and giddiness: Secondary | ICD-10-CM

## 2024-07-31 DIAGNOSIS — G9089 Other disorders of autonomic nervous system: Secondary | ICD-10-CM | POA: Insufficient documentation

## 2024-07-31 DIAGNOSIS — F0781 Postconcussional syndrome: Secondary | ICD-10-CM

## 2024-07-31 MED ORDER — PYRIDOSTIGMINE BROMIDE 60 MG PO TABS
30.0000 mg | ORAL_TABLET | Freq: Every day | ORAL | 3 refills | Status: DC
  Filled 2024-07-31: qty 45, 90d supply, fill #0

## 2024-07-31 NOTE — Patient Instructions (Addendum)
 ASSESSMENT AND PLAN :   78 y.o. year old female  here with:  Components of dysautonomia.   Little perspiration, IBS- constipation, gastroparesis, orthostatic hypotension, latent dizziness.     1)  slight feeing of swaying, of propulsion.  Strattera  had helped but caused worsening of dry mouth and dry eyes.   2) I like for her to hydrate well,  to consider regular exercise.   She can't swim so the aqua gym is out.   Plan : its far out but I fee its worth an attempt :  trial of mestinon .  This is for AM use only, helping with ortho stasis.  RV with NP in 6 months.  Get orthostatic BP and perform Romberg.      Pyridostigmine  Tablets What is this medication? PYRIDOSTIGMINE  (peer id oh STIG meen) treats myasthenia gravis, a condition that causes muscles to easily weaken or fatigue. It works by decreasing muscle weakness and loss of movement. This medicine may be used for other purposes; ask your health care provider or pharmacist if you have questions. COMMON BRAND NAME(S): Mestinon  What should I tell my care team before I take this medication? They need to know if you have any of these conditions: Asthma Difficulty passing urine Heart disease Infection in abdomen, peritonitis Irregular, slow heartbeat Kidney disease Seizures Stomach or bowel obstruction or ulcers Thyroid  disease An unusual or allergic reaction to pyridostigmine , bromides, other medications, foods, dyes, or preservatives Pregnant or trying to get pregnant Breast-feeding How should I use this medication? Take this medication by mouth with a glass of water. Take it as directed on the prescription label. Keep taking it unless your care team tells you to stop. Talk to your care team about the use of this medication in children. Special care may be needed. Overdosage: If you think you have taken too much of this medicine contact a poison control center or emergency room at once. NOTE: This medicine is only for  you. Do not share this medicine with others. What if I miss a dose? If you miss a dose, take it as soon as you can. If it is almost time for your next dose, take only that dose. Do not take double or extra doses. What may interact with this medication? Do not take this medication with any of the following: Other medications for myasthenia gravis like neostigmine Quinine This medication may also interact with the following: Atropine Bethanechol Disopyramide Edrophonium Guanadrel Guanethidine Mecamylamine Medications that block muscle or nerve pain This list may not describe all possible interactions. Give your health care provider a list of all the medicines, herbs, non-prescription drugs, or dietary supplements you use. Also tell them if you smoke, drink alcohol , or use illegal drugs. Some items may interact with your medicine. What should I watch for while using this medication? Visit your care team for regular checks on your progress. Tell your care team if your symptoms do not start to get better or if they get worse. Wear a medical ID bracelet or chain. Carry a card that describes your condition. List the medications and doses you take on the card. What side effects may I notice from receiving this medication? Side effects that you should report to your care team as soon as possible: Allergic reactions--skin rash, itching, hives, swelling of the face, lips, tongue, or throat Side effects that usually do not require medical attention (report to your care team if they continue or are bothersome): Diarrhea Excessive sweating Muscle  pain or cramps Muscle weakness Nausea Stomach pain This list may not describe all possible side effects. Call your doctor for medical advice about side effects. You may report side effects to FDA at 1-800-FDA-1088. Where should I keep my medication? Keep out of the reach of children and pets. Store between 15 and 30 degrees C (59 and 86 degrees F).  Protect from moisture. Keep the container tightly closed. Get rid of any unused medication after the expiration date. To get rid of medications that are no longer needed or have expired: Take the medication to a medication take-back program. Check with your pharmacy or law enforcement to find a location. If you cannot return the medication, check the label or package insert to see if the medication should be thrown out in the garbage or flushed down the toilet. If you are not sure, ask your care team. If it is safe to put it in the trash, pour the medication out of the container. Mix the medication with cat litter, dirt, coffee grounds, or other unwanted substance. Seal the mixture in a bag or container. Put it in the trash. NOTE: This sheet is a summary. It may not cover all possible information. If you have questions about this medicine, talk to your doctor, pharmacist, or health care provider.  2024 Elsevier/Gold Standard (2022-01-31 00:00:00)

## 2024-07-31 NOTE — Progress Notes (Signed)
 Provider:  Dedra Gores, MD  Primary Care Physician:  Waylan Almarie SAUNDERS, MD [address] [place] [address]     Referring Provider: Towana Small, Fnp [address] [place],  [place] [address]          Chief Complaint according to patient   Patient presents with:                HISTORY OF PRESENT ILLNESS:  Wendy Joyce is a 78 y.o. female patient who is seen here for a revisit 07/31/2024 for  post-concussive syndrome/ symptoms have waxed and some returned recently. She described the feeling of propulsion while standing at her kitchen counter - after standing for a while.  Also some echo of vertigo with rapid movement of the body- she likes to dance and this gets her dizzy.   Sometimes she just walks around a corner and gets the symptom. Not as severe as it was 3 years ago, it is milder.   Chief concern according to patient :   symptoms have waxed and some returned recently. She described the feeling of propulsion while standing at her kitchen counter - after standing for a while.  Also some echo of vertigo with rapid movement of the body- she likes to dance and this gets her dizzy.   Sometimes she just walks around a corner and gets the symptom. Not as severe as it was 3 years ago, it is milder.    Med hx: patient has been dx with a fungal infection in oral cavity/ throat and uses a herbal  therapy.  Dr  Sharyne Matte, Functional medicine practice .    Fam Hx : daughter has breast cancer ,  lumpectomy , 5 LN positive, dx at age 51  , dx last year.  She has 3 children and not enrolled in traditional cancer therapies.  Children were born at home,  schooled at home.   Maternal aunt  died of breast cancer - dx age 66, passed 32.   Social HX;   retired. Living with spouse.    Painting, drawing, art works.     2021_ Wendy Joyce is a 78 y.o. year old White or Caucasian female patient seen here in a consultation, upon referral on 12/09/2020 from Dr.  Delice.  Chief concern according to patient :  [name] describes that on November 06, 2020 she was in a boutique's dressing room and tried on a pair of jeans while standing.  She lost balance but was unable to break the fall or respond adequately she saw herself literally falling to the ground she fell backwards as he describes it like a rock and hit the back of her head however the floor there was carpeted but in the fall she hit the wall mounted mirror in her cabin.  She feels that there was no loss of consciousness or loss of awareness she had to maneuver on her side and then got herself up without assistance of another person. The second fall occurred on Friday, 3 December of this year she was actually sitting down on a stool in her own dressing area which is part of her bathroom and has a hard marble floor.  She was putting on leggings and stood up slowly to hang up her bathroom and during the day rotating movement to reach the clock she again fell backwards she felt no warning aura anything she just watched herself in slow motion and  her head hit heart on the marble floor in the closet door.  She also injured her right shoulder elbow right hip and hand.  She just had a surgical intervention for the right hand.   She reports her neck has remained stiff and tender, she has noted more crackling noises, she has tinnitus, stronger in the left ear- she moves very slow now, is feeling at risk , and not safe, avoiding any fast head or truncal movements.  She has a remote history of whiplash, from college days.  She had not yet seen her cardiologist Dr. Adine, has not yet undergone cardiac monitoring to see if that could be an arrhythmia.      Wendy Joyce is seen on 12-09-2020, a right -handed White or Caucasian female who  has a past medical history of Abdominal pain, generalized (01/29/2015), Abnormal thyroid  function test (02/25/2016), ADHD (attention deficit hyperactivity disorder), Allergic  rhinitis, unspecified (01/29/2015), Ankle sprain (03/16/2016), Anxiety (01/29/2015), Arthritis, Chronic kidney disease (2012), Constipation (01/29/2015), Decreased libido (05/02/2018), Disorder of the skin and subcutaneous tissue, unspecified (01/29/2015), DJD (degenerative joint disease), Dysthymia, Endometriosis, External otitis, Family history of coronary artery disease (05/02/2018), Fatigue due to excessive exertion (02/24/2016), Fever blister, Fibromyalgia (01/29/2015), Frequency of micturition (01/29/2015), Functional dyspepsia (01/29/2015), Gastro-esophageal reflux disease without esophagitis (01/29/2015), Headache(784.0), High cholesterol, Hyperlipidemia (09/12/2016), Hypersomnia, Hypothyroidism, IBS (irritable bowel syndrome) (05/02/2018), Insomnia, Internal hemorrhoids, Irritable 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 (01/29/2015), Labial pain (11/30/2016), Lactose intolerance (12/14/2016), Lactose intolerance in adult (09/12/2016), 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), Left ureteral stone, LP (lichen planus) (01/29/2015), Major depressive disorder, single episode, unspecified, Migraine with aura, not intractable, without status migrainosus, Mixed hyperlipidemia, Mouth pain (01/07/2016), Osteoarthritis of hip, Osteopenia determined by x-ray (03/02/2017), Personal history of estrogen therapy, Phlebitis and thrombophlebitis of the leg, Pityriasis alba (01/29/2015), Pure hypercholesterolemia (06/29/2016), Rectum pain (09/12/2016), Recurrent sinus infections (02/24/2016), Rhinosinusitis (01/07/2016), Rosacea, Sinusitis (12/24/2015), Tinnitus of left ear (01/29/2015), Umbilical hernia without obstruction and without gangrene (01/29/2015), Umbilical pain (05/02/2018), Unspecified urinary incontinence (01/07/2016), Urticaria, UTI (urinary tract infection), 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 (01/29/2015), and Vitamin D  deficiency.  Review of Systems: Out of a complete 14 system  review, the patient complains of only the following symptoms, and all other reviewed systems are negative.:   Sonata  prn or xanax  prn for insomnia, if anxious.       Social History   Socioeconomic History   Marital status: Married    Spouse name: Not on file   Number of children: Not on file   Years of education: Not on file   Highest education level: Bachelor's degree (e.g., BA, AB, BS)  Occupational History   Not on file  Tobacco Use   Smoking status: Never   Smokeless tobacco: Never  Vaping Use   Vaping status: Never Used  Substance and Sexual Activity   Alcohol  use: Yes    Alcohol /week: 2.0 standard drinks of alcohol     Types: 2 Glasses of wine per week    Comment: weekends occ   Drug use: No    Comment: CBD cream for pain twice weekly   Sexual activity: Yes    Partners: Male    Birth control/protection: Post-menopausal  Other Topics Concern   Not on file  Social History Narrative   Married   Regular exercise: yes; 2 x a week with a trainer   Caffeine use: coffee daily   Social Drivers of Corporate investment banker Strain: Low Risk  (05/24/2024)   Overall Financial  Resource Strain (CARDIA)    Difficulty of Paying Living Expenses: Not hard at all  Food Insecurity: No Food Insecurity (05/24/2024)   Hunger Vital Sign    Worried About Running Out of Food in the Last Year: Never true    Ran Out of Food in the Last Year: Never true  Transportation Needs: No Transportation Needs (05/24/2024)   PRAPARE - Administrator, Civil Service (Medical): No    Lack of Transportation (Non-Medical): No  Physical Activity: Sufficiently Active (05/24/2024)   Exercise Vital Sign    Days of Exercise per Week: 7 days    Minutes of Exercise per Session: 30 min  Stress: Stress Concern Present (05/24/2024)   [employer] of Occupational Health - Occupational Stress Questionnaire    Feeling of Stress : To some extent  Social Connections: Socially Integrated (05/24/2024)    Social Connection and Isolation Panel    Frequency of Communication with Friends and Family: More than three times a week    Frequency of Social Gatherings with Friends and Family: Twice a week    Attends Religious Services: More than 4 times per year    Active Member of Clubs or Organizations: Yes    Attends Engineer, structural: More than 4 times per year    Marital Status: Married    Family History  Problem Relation Age of Onset   Stroke Father    Hypertension Father    Cancer Father    Breast cancer Maternal Grandmother    Colon cancer Neg Hx    Colon polyps Neg Hx    Esophageal cancer Neg Hx    Rectal cancer Neg Hx    Stomach cancer Neg Hx     Past Medical History:  Diagnosis Date   Abdominal pain, generalized 01/29/2015   Abnormal thyroid  function test 02/25/2016   ADHD (attention deficit hyperactivity disorder)    Allergic rhinitis, unspecified 01/29/2015   Allergy     Anemia    DOE   Ankle sprain 03/16/2016   Anxiety 01/29/2015   Arthritis    hands   Chronic kidney disease 2012   kidney stones   Constipation 01/29/2015   Decreased libido 05/02/2018   Disorder of the skin and subcutaneous tissue, unspecified 01/29/2015   DJD (degenerative joint disease)    hand   Dysthymia    Endometriosis    External otitis    Family history of coronary artery disease 05/02/2018   Fatigue due to excessive exertion 02/24/2016   Fever blister    Fibromyalgia 01/29/2015   Frequency of micturition 01/29/2015   Functional dyspepsia 01/29/2015   Gastro-esophageal reflux disease without esophagitis 01/29/2015   Headache(784.0)    High cholesterol    Hyperlipidemia 09/12/2016   Hypersomnia    Hypothyroidism    IBS (irritable bowel syndrome) 05/02/2018   Insomnia    Internal hemorrhoids    Irritable 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 01/29/2015   Labial pain 11/30/2016   Lactose intolerance 12/14/2016   Lactose intolerance in adult 09/12/2016   Left anterior  fascicular block 05/02/2018   Left ureteral stone    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   Major depressive disorder, single episode, unspecified    Migraine with aura, not intractable, without status migrainosus    Mixed hyperlipidemia    Mouth pain 01/07/2016   Osteoarthritis of hip    Osteopenia determined by x-ray 03/02/2017   Personal history of estrogen therapy    Phlebitis and thrombophlebitis of the leg  Pityriasis alba 01/29/2015   Pure hypercholesterolemia 06/29/2016   Rectum pain 09/12/2016   Recurrent sinus infections 02/24/2016   Rhinosinusitis 01/07/2016   Rosacea    Sinusitis 12/24/2015   Tinnitus of left ear 01/29/2015   Umbilical hernia without obstruction and without gangrene 01/29/2015   Umbilical pain 05/02/2018   Unspecified urinary incontinence 01/07/2016   Urticaria    UTI (urinary tract infection)    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   Vitamin D  deficiency     Past Surgical History:  Procedure Laterality Date   BREAST ENHANCEMENT SURGERY     BREAST REDUCTION SURGERY Bilateral 2018   COLONOSCOPY     COLONOSCOPY WITH PROPOFOL  N/A 02/25/2013   Procedure: COLONOSCOPY WITH PROPOFOL ;  Surgeon: Gladis MARLA Louder, MD;  Location: [facility];  Service: Endoscopy;  Laterality: N/A;   left hand surgery  12/28/2022   mini facelift  2000   NASAL SINUS SURGERY     REDUCTION MAMMAPLASTY     ROTATOR CUFF REPAIR     Right   ROTATOR CUFF REPAIR     Left   SEPTOPLASTY WITH ETHMOIDECTOMY, AND MAXILLARY ANTROSTOMY Bilateral 2006   TOTAL ABDOMINAL HYSTERECTOMY     ovaries remain   TUBAL LIGATION     umbicial hernia       Current Outpatient Medications on File Prior to Visit  Medication Sig Dispense Refill   acyclovir  ointment (ZOVIRAX ) 5 % Apply 1 application. topically daily as needed (for fever blister).     albuterol  (VENTOLIN  HFA) 108 (90 Base) MCG/ACT inhaler Inhale 2 puffs into the lungs every 6 (six) hours as needed  for wheezing or shortness of breath. 18 g 1   ALPRAZolam  (XANAX ) 0.25 MG tablet Take 1-2 tablets (0.25-0.5 mg total) by mouth daily as needed. 60 tablet 2   estradiol  (VIVELLE -DOT) 0.025 MG/24HR Place 1 patch onto the skin 2 (two) times a week. 16 patch 1   metFORMIN  (GLUCOPHAGE ) 500 MG tablet Take 0.5 tablet by mouth daily for 7 days, THEN 0.5 tablet twice daily for 7 days, THEN 1 tablet in AM and 0.5 tablet in PM for 7 days, THEN 1 tablet twice daily. 90 tablet 0   MIEBO 1.338 GM/ML SOLN Place 1 drop into both eyes in the morning, at noon, and at bedtime.     montelukast  (SINGULAIR ) 10 MG tablet Take 1 tablet (10 mg total) by mouth daily for allergies 90 tablet 0   Naltrexone  HCl, Pain, 4.5 MG CAPS Prescribed by psychiatry for depression. Once a day. 30 capsule 0   neomycin -polymyxin-dexameth (MAXITROL ) 0.1 % OINT Apply a small amount on eyelid twice a day 3.5 g 2   Polyethyl Glycol-Propyl Glycol (SYSTANE) 0.4-0.3 % SOLN Apply 1 drop to eye daily.     progesterone  (PROMETRIUM ) 100 MG capsule Take 1 capsule (100 mg total) by mouth before bedtime for 1 week. May increase to 2 capsules to help with sleep. 90 capsule 3   SYNTHROID  125 MCG tablet Take 1 tablet (125 mcg total) by mouth every Monday, Wednesday, and Friday. 40 tablet 2   valACYclovir  (VALTREX ) 500 MG tablet Take 1 tablet (500 mg total) by mouth 2 (two) times daily. (Patient taking differently: Take 500 mg by mouth daily.) 180 tablet 2   zaleplon  (SONATA ) 10 MG capsule Take 1 capsule (10 mg total) by mouth at bedtime as needed for sleep. 30 capsule 0   atomoxetine  (STRATTERA ) 10 MG capsule Take 1 capsule (10 mg total) by mouth  daily. 30 capsule 2   cycloSPORINE  (RESTASIS ) 0.05 % ophthalmic emulsion Place 1 drop into both eyes 2 (two) times daily. (Patient not taking: Reported on 07/31/2024) 180 each 4   Lifitegrast  (XIIDRA ) 5 % SOLN Instill 1 drop into both eyes twice a day 180 each 4   ondansetron  (ZOFRAN -ODT) 4 MG disintegrating tablet  place 1 tablet (4 mg total) under the tongur every 6 (six) hours as needed for nausea 12 tablet 0   oxyCODONE  (OXY IR/ROXICODONE ) 5 MG immediate release tablet Take 1 tablet (5 mg total) by mouth every 4 (four) to 6 (six) hours as needed for 5 days. 15 tablet 0   [DISCONTINUED] donepezil  (ARICEPT ) 5 MG tablet Take 1 tablet (5 mg total) by mouth 2 (two) times daily. 180 tablet 1   [DISCONTINUED] prazosin  (MINIPRESS ) 2 MG capsule 1 TO 3 PO QHS (Patient not taking: Reported on 06/17/2022) 90 capsule 5   [DISCONTINUED] Vilazodone  HCl (VIIBRYD ) 10 MG TABS 1 PO QD W/DINNER 90 tablet 3   No current facility-administered medications on file prior to visit.    Allergies  Allergen Reactions   Adhesive [Tape] Dermatitis   Atovaquone-Proguanil Hcl Other (See Comments)   Bioflavonoids    Citrus Nausea And Vomiting   Emedastine Nausea And Vomiting   Escitalopram Hives   Escitalopram Oxalate Hives   Ezetimibe-Simvastatin Other (See Comments)   Niacin Other (See Comments)   Niacin And Related     Rash and breathing breathing problems   Rosuvastatin Other (See Comments)   Versed  [Midazolam ] Nausea And Vomiting    For a week per pt   Amoxicillin Rash    Unsure of reaction/ was instructed not to take drug. Unknown reaction    Hydrochlorothiazide Rash     DIAGNOSTIC DATA (LABS, IMAGING, TESTING) - I reviewed patient records, labs, notes, testing and imaging myself where available.  Lab Results  Component Value Date   WBC 4.7 11/16/2023   HGB 13.0 11/16/2023   HCT 37.9 11/16/2023   MCV 94.0 11/16/2023   PLT 206 11/16/2023      Component Value Date/Time   NA 138 11/16/2023 0941   NA 136 11/15/2023 0841   K 4.0 11/16/2023 0941   CL 106 11/16/2023 0941   CO2 24 11/16/2023 0941   GLUCOSE 101 (H) 11/16/2023 0941   BUN 27 (H) 11/16/2023 0941   BUN 27 11/15/2023 0841   CREATININE 0.49 11/16/2023 0941   CALCIUM  9.3 11/16/2023 0941   PROT 6.4 04/12/2023 1343   ALBUMIN 4.2 04/12/2023 1343    AST 16 04/12/2023 1343   ALT 18 04/12/2023 1343   ALKPHOS 49 04/12/2023 1343   BILITOT <0.2 04/12/2023 1343   GFRNONAA >60 11/16/2023 0941   GFRAA 111 08/13/2018 1001   Lab Results  Component Value Date   CHOL 207 (H) 06/17/2022   HDL 68 06/17/2022   LDLCALC 114 (H) 06/17/2022   TRIG 145 06/17/2022   CHOLHDL 3.0 06/17/2022   Lab Results  Component Value Date   HGBA1C 5.6 11/10/2022   Lab Results  Component Value Date   VITAMINB12 >2000 (H) 11/10/2022   Lab Results  Component Value Date   TSH 4.830 (H) 11/15/2023    PHYSICAL EXAM:  Vitals:   07/31/24 1053  BP: 132/62  Pulse: 64   No data found. Body mass index is 24.3 kg/m.   Wt Readings from Last 3 Encounters:  07/31/24 137 lb 3.2 oz (62.2 kg)  01/18/24 142 lb (64.4 kg)  11/16/23 142  lb (64.4 kg)     Ht Readings from Last 3 Encounters:  07/31/24 5' 3 (1.6 m)  01/18/24 5' 4.5 (1.638 m)  11/16/23 5' 3 (1.6 m)      General: The patient is awake, alert and appears not in acute distress and groomed. Head: Normocephalic, atraumatic.  Neck is supple. The patient is well groomed.  Dental status: biological  Cardiovascular:  Regular rate and cardiac rhythm by pulse,  without distended neck veins. Respiratory: Lungs are clear to auscultation.  Skin:  Without evidence of ankle edema, or rash. Trunk: The patient's posture is erect.     NEUROLOGIC EXAM: The patient is awake and alert, oriented to place and time.   Memory subjective described as intact.  Attention span & concentration ability appears normal.  Speech is fluent, low volume -  without  dysarthria, dysphonia .   Cranial nerves: no loss of smell or taste reported  Pupils are equal and briskly reactive to light.  Funduscopic exam no palor , had cataract surgery .  Extraocular movements in vertical and horizontal planes were intact and without nystagmus. No Diplopia. Visual fields by finger perimetry are intact. Hearing was intact to soft voice  and finger rubbing.    Facial sensation intact to fine touch.  Facial motor strength is symmetric and tongue and uvula move midline.  Neck ROM : rotation, tilt and flexion extension were normal for age and shoulder shrug was symmetrical.    Motor exam:  Symmetric bulk, tone and ROM.   Normal tone.   Sensory:  Fine touch, pinprick and vibration were intact . Coordination: Rapid alternating movements in the fingers/hands were of normal speed.  The Finger-to-nose maneuver was intact without evidence of ataxia, dysmetria or tremor.   Gait and station: Patient could rise unassisted from a seated position, walked without assistive device.  Stance is of normal width/ base  Toe and heel walk were deferred.    ROMBERG POSITIVE - swaying retro- and propulsive  Deep tendon reflexes: in the  upper and lower extremities are symmetric and intact.  Babinski response was deferred .      ASSESSMENT AND PLAN :   78 y.o. year old female  here with:  Components of dysautonomia.   Little perspiration, IBS- constipation, gastroparesis, orthostatic hypotension, latent dizziness.     1)  slight feeing of swaying, of propulsion.  Strattera  had helped but caused worsening of dry mouth and dry eyes.   2) I like for her to hydrate well,  to consider regular exercise.   She can't swim so the aqua gym is out.   Plan : its far out but I fee its worth an attempt :  trial of mestinon .  This is for AM use only, helping with ortho stasis.  RV with NP in 6 months.  Get orthostatic BP and perform Romberg.     I would like to thank Waylan Almarie SAUNDERS, MD and Towana Small, Fnp [address] [place],  [place] [address] for allowing me to meet with this pleasant patient.    The patient's condition requires frequent monitoring and adjustments in the treatment plan, reflecting the ongoing complexity of care.  This provider is the continuing focal point for all needed services for this condition.  After spending a  total time of  39  minutes face to face and time for  history taking, physical and neurologic examination, review of laboratory studies,  personal review of imaging studies, reports and results of other  testing and review of referral information / records as far as provided in visit,   Electronically signed by: Dedra Gores, MD 07/31/2024 10:57 AM  [facility] Sleep Board certified by The [employer] of Sleep Medicine and Diplomate of the [employer] of Sleep Medicine. Board certified In Neurology through the ABPN, Fellow of the [employer] of Neu[employer].

## 2024-08-05 ENCOUNTER — Other Ambulatory Visit (HOSPITAL_BASED_OUTPATIENT_CLINIC_OR_DEPARTMENT_OTHER): Payer: Self-pay

## 2024-08-06 ENCOUNTER — Ambulatory Visit (HOSPITAL_BASED_OUTPATIENT_CLINIC_OR_DEPARTMENT_OTHER)
Admission: RE | Admit: 2024-08-06 | Discharge: 2024-08-06 | Disposition: A | Discharge status: Home / Self Care | Source: Ambulatory Visit | Attending: Family Medicine | Admitting: Family Medicine

## 2024-08-06 DIAGNOSIS — Z13820 Encounter for screening for osteoporosis: Secondary | ICD-10-CM | POA: Diagnosis not present

## 2024-08-06 DIAGNOSIS — Z780 Asymptomatic menopausal state: Secondary | ICD-10-CM | POA: Insufficient documentation

## 2024-08-06 DIAGNOSIS — M8589 Other specified disorders of bone density and structure, multiple sites: Secondary | ICD-10-CM | POA: Diagnosis not present

## 2024-08-06 DIAGNOSIS — M8580 Other specified disorders of bone density and structure, unspecified site: Secondary | ICD-10-CM | POA: Insufficient documentation

## 2024-08-12 ENCOUNTER — Other Ambulatory Visit (HOSPITAL_BASED_OUTPATIENT_CLINIC_OR_DEPARTMENT_OTHER): Payer: Self-pay

## 2024-08-12 DIAGNOSIS — E039 Hypothyroidism, unspecified: Secondary | ICD-10-CM | POA: Diagnosis not present

## 2024-08-12 DIAGNOSIS — E782 Mixed hyperlipidemia: Secondary | ICD-10-CM | POA: Diagnosis not present

## 2024-08-12 DIAGNOSIS — Z91199 Patient's noncompliance with other medical treatment and regimen due to unspecified reason: Secondary | ICD-10-CM | POA: Diagnosis not present

## 2024-08-12 DIAGNOSIS — J329 Chronic sinusitis, unspecified: Secondary | ICD-10-CM | POA: Diagnosis not present

## 2024-08-12 DIAGNOSIS — B9689 Other specified bacterial agents as the cause of diseases classified elsewhere: Secondary | ICD-10-CM | POA: Diagnosis not present

## 2024-08-12 DIAGNOSIS — J069 Acute upper respiratory infection, unspecified: Secondary | ICD-10-CM | POA: Diagnosis not present

## 2024-08-12 DIAGNOSIS — M8580 Other specified disorders of bone density and structure, unspecified site: Secondary | ICD-10-CM | POA: Diagnosis not present

## 2024-08-12 MED ORDER — ALBUTEROL SULFATE HFA 108 (90 BASE) MCG/ACT IN AERS
2.0000 | INHALATION_SPRAY | RESPIRATORY_TRACT | 0 refills | Status: DC | PRN
  Filled 2024-08-12: qty 6.7, 17d supply, fill #0

## 2024-08-12 MED ORDER — BENZONATATE 200 MG PO CAPS
200.0000 mg | ORAL_CAPSULE | Freq: Three times a day (TID) | ORAL | 0 refills | Status: DC | PRN
  Filled 2024-08-12: qty 21, 7d supply, fill #0

## 2024-08-12 MED ORDER — IPRATROPIUM BROMIDE 0.06 % NA SOLN
2.0000 | Freq: Three times a day (TID) | NASAL | 0 refills | Status: DC
  Filled 2024-08-12: qty 15, 25d supply, fill #0

## 2024-08-12 MED ORDER — COVID-19 FLU A+B ANTIGEN TEST VI KIT
PACK | 0 refills | Status: AC
  Filled 2024-08-13 – 2024-08-14 (×2): qty 1, fill #0

## 2024-08-12 MED ORDER — DOXYCYCLINE HYCLATE 100 MG PO TABS
100.0000 mg | ORAL_TABLET | Freq: Two times a day (BID) | ORAL | 0 refills | Status: AC
  Filled 2024-08-12: qty 14, 7d supply, fill #0

## 2024-08-13 ENCOUNTER — Other Ambulatory Visit (HOSPITAL_BASED_OUTPATIENT_CLINIC_OR_DEPARTMENT_OTHER): Payer: Self-pay

## 2024-08-14 ENCOUNTER — Other Ambulatory Visit (HOSPITAL_BASED_OUTPATIENT_CLINIC_OR_DEPARTMENT_OTHER): Payer: Self-pay

## 2024-08-19 ENCOUNTER — Other Ambulatory Visit (HOSPITAL_BASED_OUTPATIENT_CLINIC_OR_DEPARTMENT_OTHER): Payer: Self-pay

## 2024-08-19 DIAGNOSIS — J189 Pneumonia, unspecified organism: Secondary | ICD-10-CM | POA: Diagnosis not present

## 2024-08-19 DIAGNOSIS — R5383 Other fatigue: Secondary | ICD-10-CM | POA: Diagnosis not present

## 2024-08-19 DIAGNOSIS — J069 Acute upper respiratory infection, unspecified: Secondary | ICD-10-CM | POA: Diagnosis not present

## 2024-08-19 DIAGNOSIS — Z1159 Encounter for screening for other viral diseases: Secondary | ICD-10-CM | POA: Diagnosis not present

## 2024-08-19 MED ORDER — BUDESONIDE-FORMOTEROL FUMARATE 160-4.5 MCG/ACT IN AERO
2.0000 | INHALATION_SPRAY | Freq: Two times a day (BID) | RESPIRATORY_TRACT | 0 refills | Status: AC
  Filled 2024-08-19: qty 10.2, 30d supply, fill #0

## 2024-08-19 MED ORDER — LEVOFLOXACIN 750 MG PO TABS
750.0000 mg | ORAL_TABLET | Freq: Every day | ORAL | 0 refills | Status: AC
  Filled 2024-08-19: qty 7, 7d supply, fill #0

## 2024-08-19 MED ORDER — BENZONATATE 200 MG PO CAPS
200.0000 mg | ORAL_CAPSULE | Freq: Three times a day (TID) | ORAL | 0 refills | Status: AC | PRN
  Filled 2024-08-19 – 2024-09-02 (×2): qty 21, 7d supply, fill #0

## 2024-08-20 ENCOUNTER — Encounter: Payer: Self-pay | Admitting: Neurology

## 2024-08-20 ENCOUNTER — Telehealth: Payer: Self-pay | Admitting: Adult Health

## 2024-08-20 NOTE — Telephone Encounter (Signed)
 Pt called to see if MD or Nurse can send to PCP a copy of dementia test so  Pt can explain she doesn't have dementia  Pt would like to know how she can obtain copy of those notes  to give to PCP

## 2024-08-20 NOTE — Telephone Encounter (Signed)
 Called pt and she stated that her pcp put dementia on her chart and would like all of our records sent to dr. Nasario office especially the one from 09/09/21 w/[facility] where she scored 30/30. Will route to debra and get her to send ALL records to pcp

## 2024-08-21 ENCOUNTER — Telehealth: Payer: Self-pay | Admitting: *Deleted

## 2024-08-21 NOTE — Telephone Encounter (Signed)
 Medical records faxed on 08/21/2024 To Dr Almarie Scala

## 2024-08-22 ENCOUNTER — Other Ambulatory Visit (HOSPITAL_BASED_OUTPATIENT_CLINIC_OR_DEPARTMENT_OTHER): Payer: Self-pay

## 2024-08-23 ENCOUNTER — Other Ambulatory Visit (HOSPITAL_BASED_OUTPATIENT_CLINIC_OR_DEPARTMENT_OTHER): Payer: Self-pay

## 2024-08-23 DIAGNOSIS — J189 Pneumonia, unspecified organism: Secondary | ICD-10-CM | POA: Diagnosis not present

## 2024-08-27 ENCOUNTER — Other Ambulatory Visit (HOSPITAL_BASED_OUTPATIENT_CLINIC_OR_DEPARTMENT_OTHER): Payer: Self-pay

## 2024-08-28 ENCOUNTER — Other Ambulatory Visit (HOSPITAL_BASED_OUTPATIENT_CLINIC_OR_DEPARTMENT_OTHER): Payer: Self-pay

## 2024-08-29 ENCOUNTER — Other Ambulatory Visit (HOSPITAL_BASED_OUTPATIENT_CLINIC_OR_DEPARTMENT_OTHER): Payer: Self-pay

## 2024-08-29 DIAGNOSIS — E039 Hypothyroidism, unspecified: Secondary | ICD-10-CM | POA: Diagnosis not present

## 2024-08-29 DIAGNOSIS — I2510 Atherosclerotic heart disease of native coronary artery without angina pectoris: Secondary | ICD-10-CM | POA: Diagnosis not present

## 2024-08-29 DIAGNOSIS — Z79890 Hormone replacement therapy (postmenopausal): Secondary | ICD-10-CM | POA: Diagnosis not present

## 2024-08-30 ENCOUNTER — Other Ambulatory Visit (HOSPITAL_BASED_OUTPATIENT_CLINIC_OR_DEPARTMENT_OTHER): Payer: Self-pay

## 2024-08-30 LAB — LAB REPORT - SCANNED
Free T4: 1.57 ng/dL
TSH: 0.229

## 2024-09-01 MED ORDER — IPRATROPIUM BROMIDE 0.06 % NA SOLN
2.0000 | Freq: Three times a day (TID) | NASAL | 0 refills | Status: AC
  Filled 2024-09-01 – 2024-09-03 (×2): qty 15, 25d supply, fill #0

## 2024-09-02 ENCOUNTER — Other Ambulatory Visit (HOSPITAL_BASED_OUTPATIENT_CLINIC_OR_DEPARTMENT_OTHER): Payer: Self-pay

## 2024-09-02 ENCOUNTER — Other Ambulatory Visit: Payer: Self-pay

## 2024-09-03 ENCOUNTER — Other Ambulatory Visit (HOSPITAL_BASED_OUTPATIENT_CLINIC_OR_DEPARTMENT_OTHER): Payer: Self-pay

## 2024-09-10 ENCOUNTER — Other Ambulatory Visit (HOSPITAL_BASED_OUTPATIENT_CLINIC_OR_DEPARTMENT_OTHER): Payer: Self-pay

## 2024-09-11 ENCOUNTER — Other Ambulatory Visit (HOSPITAL_BASED_OUTPATIENT_CLINIC_OR_DEPARTMENT_OTHER): Payer: Self-pay

## 2024-09-12 ENCOUNTER — Other Ambulatory Visit (HOSPITAL_BASED_OUTPATIENT_CLINIC_OR_DEPARTMENT_OTHER): Payer: Self-pay

## 2024-09-12 MED ORDER — ESTRADIOL 0.025 MG/24HR TD PTTW
1.0000 | MEDICATED_PATCH | TRANSDERMAL | 5 refills | Status: DC
  Filled 2024-09-12: qty 16, 56d supply, fill #0
  Filled 2024-11-11: qty 16, 56d supply, fill #1

## 2024-09-12 MED ORDER — LEVOTHYROXINE SODIUM 125 MCG PO TABS
125.0000 ug | ORAL_TABLET | Freq: Every morning | ORAL | 1 refills | Status: AC
  Filled 2024-09-12 – 2024-09-17 (×3): qty 90, 90d supply, fill #0
  Filled 2024-12-10 – 2024-12-14 (×2): qty 90, 90d supply, fill #1

## 2024-09-14 ENCOUNTER — Other Ambulatory Visit (HOSPITAL_BASED_OUTPATIENT_CLINIC_OR_DEPARTMENT_OTHER): Payer: Self-pay

## 2024-09-16 ENCOUNTER — Other Ambulatory Visit (HOSPITAL_BASED_OUTPATIENT_CLINIC_OR_DEPARTMENT_OTHER): Payer: Self-pay

## 2024-09-17 ENCOUNTER — Other Ambulatory Visit (HOSPITAL_BASED_OUTPATIENT_CLINIC_OR_DEPARTMENT_OTHER): Payer: Self-pay

## 2024-09-17 ENCOUNTER — Encounter: Payer: Self-pay | Admitting: Family Medicine

## 2024-09-17 ENCOUNTER — Encounter: Admitting: Family Medicine

## 2024-09-17 ENCOUNTER — Ambulatory Visit: Admitting: Family Medicine

## 2024-09-17 VITALS — BP 116/66 | HR 69 | Temp 98.0°F | Resp 16 | Ht 62.6 in | Wt 135.0 lb

## 2024-09-17 DIAGNOSIS — E039 Hypothyroidism, unspecified: Secondary | ICD-10-CM | POA: Diagnosis not present

## 2024-09-17 DIAGNOSIS — R0609 Other forms of dyspnea: Secondary | ICD-10-CM

## 2024-09-17 DIAGNOSIS — S069X9A Unspecified intracranial injury with loss of consciousness of unspecified duration, initial encounter: Secondary | ICD-10-CM | POA: Insufficient documentation

## 2024-09-17 DIAGNOSIS — Z1239 Encounter for other screening for malignant neoplasm of breast: Secondary | ICD-10-CM

## 2024-09-17 DIAGNOSIS — G9089 Other disorders of autonomic nervous system: Secondary | ICD-10-CM | POA: Diagnosis not present

## 2024-09-17 DIAGNOSIS — F331 Major depressive disorder, recurrent, moderate: Secondary | ICD-10-CM

## 2024-09-17 DIAGNOSIS — T466X5A Adverse effect of antihyperlipidemic and antiarteriosclerotic drugs, initial encounter: Secondary | ICD-10-CM

## 2024-09-17 DIAGNOSIS — F5101 Primary insomnia: Secondary | ICD-10-CM

## 2024-09-17 DIAGNOSIS — E063 Autoimmune thyroiditis: Secondary | ICD-10-CM

## 2024-09-17 DIAGNOSIS — R7303 Prediabetes: Secondary | ICD-10-CM

## 2024-09-17 DIAGNOSIS — E785 Hyperlipidemia, unspecified: Secondary | ICD-10-CM

## 2024-09-17 DIAGNOSIS — G720 Drug-induced myopathy: Secondary | ICD-10-CM

## 2024-09-17 NOTE — Patient Instructions (Addendum)
 A few things to remember from today's visit:  Encounter for screening for malignant neoplasm of breast, unspecified screening modality - Plan: MM 3D SCREENING MAMMOGRAM BILATERAL BREAST  Moderate episode of recurrent major depressive disorder (HCC), Chronic  Unspecified intracranial injury with loss of consciousness of unspecified duration, initial encounter (HCC), Chronic  Hyperlipidemia, unspecified hyperlipidemia type  Hypothyroidism due to Hashimoto's thyroiditis  No changes today.  If you need refills for medications you take chronically, please call your pharmacy. Do not use My Chart to request refills or for acute issues that need immediate attention. If you send a my chart message, it may take a few days to be addressed, specially if I am not in the office.  Please be sure medication list is accurate. If a new problem present, please set up appointment sooner than planned today.
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
## 2024-09-18 ENCOUNTER — Ambulatory Visit (INDEPENDENT_AMBULATORY_CARE_PROVIDER_SITE_OTHER): Admitting: Orthopaedic Surgery

## 2024-09-18 ENCOUNTER — Other Ambulatory Visit (HOSPITAL_BASED_OUTPATIENT_CLINIC_OR_DEPARTMENT_OTHER): Payer: Self-pay

## 2024-09-18 ENCOUNTER — Ambulatory Visit (HOSPITAL_BASED_OUTPATIENT_CLINIC_OR_DEPARTMENT_OTHER)

## 2024-09-18 DIAGNOSIS — M25511 Pain in right shoulder: Secondary | ICD-10-CM | POA: Diagnosis not present

## 2024-09-18 DIAGNOSIS — M19019 Primary osteoarthritis, unspecified shoulder: Secondary | ICD-10-CM | POA: Diagnosis not present

## 2024-09-18 DIAGNOSIS — M25519 Pain in unspecified shoulder: Secondary | ICD-10-CM | POA: Diagnosis not present

## 2024-09-18 DIAGNOSIS — G8929 Other chronic pain: Secondary | ICD-10-CM

## 2024-09-18 MED ORDER — TRIAMCINOLONE ACETONIDE 40 MG/ML IJ SUSP
80.0000 mg | INTRAMUSCULAR | Status: AC | PRN
  Administered 2024-09-18: 80 mg via INTRA_ARTICULAR

## 2024-09-18 MED ORDER — LIDOCAINE HCL 1 % IJ SOLN
4.0000 mL | INTRAMUSCULAR | Status: AC | PRN
  Administered 2024-09-18: 4 mL

## 2024-09-18 NOTE — Progress Notes (Signed)
 Chief Complaint: Right shoulder pain     History of Present Illness:   09/18/2024: Right shoulder follow-up.  She has status post rotator cuff repair in the early 2000's with Dr. Jacklin.  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  She is having a hard time sleeping    Surgical History:   None  PMH/PSH/Family History/Social History/Meds/Allergies:    Past Medical History:  Diagnosis Date  . Abdominal pain, generalized 01/29/2015  . Abnormal thyroid  function test 02/25/2016  . ADHD (attention deficit hyperactivity disorder)   . Allergic rhinitis, unspecified 01/29/2015  . Allergy    . Anemia    DOE  . Ankle sprain 03/16/2016  . Anxiety 01/29/2015  . Arthritis    hands  . Chronic kidney disease 2012   kidney stones  . Constipation 01/29/2015  . Decreased libido 05/02/2018  . Disorder of the skin and subcutaneous tissue, unspecified 01/29/2015  . DJD (degenerative joint disease)    hand  . Dysthymia   . Endometriosis   . External otitis   . Family history of coronary artery disease 05/02/2018  . Fatigue due to excessive exertion 02/24/2016  . Fever blister   . Fibromyalgia 01/29/2015  . Frequency of micturition 01/29/2015  . Functional dyspepsia 01/29/2015  . Gastro-esophageal reflux disease without esophagitis 01/29/2015  . Headache(784.0)   . High cholesterol   . Hyperlipidemia 09/12/2016  . Hypersomnia   . Hypothyroidism   . IBS (irritable bowel syndrome) 05/02/2018  . Insomnia   . Internal hemorrhoids   . 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 Labial pain 11/30/2016  . Lactose intolerance 12/14/2016  . Lactose intolerance in adult 09/12/2016  . 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 Left ureteral stone   . 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 Major depressive disorder, single episode,  unspecified   . Migraine with aura, not intractable, without status migrainosus   . Mixed hyperlipidemia   . Mouth pain 01/07/2016  . Osteoarthritis of hip   . Osteopenia determined by x-ray 03/02/2017  . Personal history of estrogen therapy   . Phlebitis and thrombophlebitis of the leg   . Pityriasis alba 01/29/2015  . Pure hypercholesterolemia 06/29/2016  . Rectum pain 09/12/2016  . Recurrent sinus infections 02/24/2016  . Rhinosinusitis 01/07/2016  . Rosacea   . Sinusitis 12/24/2015  . 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 Umbilical hernia without obstruction and without gangrene 01/29/2015  . Umbilical pain 05/02/2018  . Unspecified urinary incontinence 01/07/2016  . Urticaria   . UTI (urinary tract infection)   . 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 Vitamin D  deficiency    Past Surgical History:  Procedure Laterality Date  . BREAST ENHANCEMENT SURGERY    . BREAST REDUCTION SURGERY Bilateral 2018  . COLONOSCOPY    . COLONOSCOPY WITH PROPOFOL  N/A 02/25/2013   Procedure: COLONOSCOPY WITH PROPOFOL ;  Surgeon: Gladis MARLA Louder, MD;  Location: [facility];  Service: Endoscopy;  Laterality: N/A;  . left hand surgery  12/28/2022  . mini facelift  2000  . NASAL SINUS SURGERY    . REDUCTION MAMMAPLASTY    . ROTATOR CUFF REPAIR  Right  . ROTATOR CUFF REPAIR     Left  . SEPTOPLASTY WITH ETHMOIDECTOMY, AND MAXILLARY ANTROSTOMY Bilateral 2006  . TOTAL ABDOMINAL HYSTERECTOMY     ovaries remain  . TUBAL LIGATION    . umbicial hernia     Social History   Socioeconomic History  . Marital status: Married    Spouse name: Not on file  . Number of children: Not on file  . Years of education: Not on file  . Highest education level: Bachelor's degree (e.g., BA, AB, BS)  Occupational History  . Not on file  Tobacco Use  . Smoking status: Never  . Smokeless tobacco: Never  Vaping Use  . Vaping status: Never Used  Substance and Sexual  Activity  . Alcohol  use: Yes    Alcohol /week: 2.0 standard drinks of alcohol     Types: 2 Glasses of wine per week    Comment: weekends occ  . Drug use: No    Comment: CBD cream for pain twice weekly  . Sexual activity: Yes    Partners: Male    Birth control/protection: Post-menopausal  Other Topics Concern  . Not on file  Social History Narrative   Married   Regular exercise: yes; 2 x a week with a trainer   Caffeine use: coffee daily   Social Drivers of Corporate investment banker Strain: Low Risk  (05/24/2024)   Overall Financial Resource Strain (CARDIA)   . Difficulty of Paying Living Expenses: Not hard at all  Food Insecurity: No Food Insecurity (05/24/2024)   Hunger Vital Sign   . Worried About Programme researcher, broadcasting/film/video in the Last Year: Never true   . Ran Out of Food in the Last Year: Never true  Transportation Needs: No Transportation Needs (05/24/2024)   PRAPARE - Transportation   . Lack of Transportation (Medical): No   . Lack of Transportation (Non-Medical): No  Physical Activity: Sufficiently Active (05/24/2024)   Exercise Vital Sign   . Days of Exercise per Week: 7 days   . Minutes of Exercise per Session: 30 min  Stress: Stress Concern Present (05/24/2024)   [employer] of Occupational Health - Occupational Stress Questionnaire   . Feeling of Stress : To some extent  Social Connections: Socially Integrated (05/24/2024)   Social Connection and Isolation Panel   . Frequency of Communication with Friends and Family: More than three times a week   . Frequency of Social Gatherings with Friends and Family: Twice a week   . Attends Religious Services: More than 4 times per year   . Active Member of Clubs or Organizations: Yes   . Attends Banker Meetings: More than 4 times per year   . Marital Status: Married   Family History  Problem Relation Age of Onset  . Stroke Father   . Hypertension Father   . Cancer Father   . Breast cancer Maternal Grandmother    . Colon cancer Neg Hx   . Colon polyps Neg Hx   . Esophageal cancer Neg Hx   . Rectal cancer Neg Hx   . Stomach cancer Neg Hx    Allergies  Allergen Reactions  . Adhesive [Tape] Dermatitis  . Atovaquone-Proguanil Hcl Other (See Comments)  . Bioflavonoids   . Citrus Nausea And Vomiting  . Emedastine Nausea And Vomiting  . Escitalopram Hives  . Escitalopram Oxalate Hives  . Ezetimibe-Simvastatin Other (See Comments)  . Niacin Other (See Comments)  . Niacin And Related  Rash and breathing breathing problems  . Rosuvastatin Other (See Comments)  . Versed  [Midazolam ] Nausea And Vomiting    For a week per pt  . Amoxicillin Rash    Unsure of reaction/ was instructed not to take drug. Unknown reaction   . Hydrochlorothiazide Rash   Current Outpatient Medications  Medication Sig Dispense Refill  . acyclovir  ointment (ZOVIRAX ) 5 % Apply 1 application. topically daily as needed (for fever blister).    . ALPRAZolam  (XANAX ) 0.25 MG tablet Take 1-2 tablets (0.25-0.5 mg total) by mouth daily as needed. 60 tablet 2  . benzonatate  (TESSALON ) 200 MG capsule Take 1 capsule (200 mg total) by mouth 3 (three) times daily as needed for cough. 21 capsule 0  . budesonide -formoterol  (SYMBICORT ) 160-4.5 MCG/ACT inhaler Inhale 2 puffs into the lungs 2 (two) times daily. 10.2 g 0  . estradiol  (VIVELLE -DOT) 0.025 MG/24HR Place 1 patch onto the skin on abdomen 2 (two) times a week. 16 patch 5  . Influenza-SARS At Home Antigen (COVID-19 FLU A+B ANTIGEN TEST) KIT Use as directed. 1 kit 0  . ipratropium (ATROVENT ) 0.06 % nasal spray Place 2 sprays into both nostrils 3 (three) times daily for 4 days, then stop. Not for long term use. 15 mL 0  . levothyroxine  (SYNTHROID ) 125 MCG tablet Take 1 tablet (125 mcg total) by mouth in the morning on an empty stomach. Wait 1 hour to have food or coffee. 90 tablet 1  . metFORMIN  (GLUCOPHAGE ) 500 MG tablet Take 0.5 tablet by mouth daily for 7 days, THEN 0.5 tablet twice  daily for 7 days, THEN 1 tablet in AM and 0.5 tablet in PM for 7 days, THEN 1 tablet twice daily. (Patient taking differently: 250 mg. Take 0.5 tablet by mouth daily for 7 days, THEN 0.5 tablet twice daily for 7 days, THEN 1 tablet in AM and 0.5 tablet in PM for 7 days, THEN 1 tablet twice daily.) 90 tablet 0  . MIEBO 1.338 GM/ML SOLN Place 1 drop into both eyes in the morning, at noon, and at bedtime.    . montelukast  (SINGULAIR ) 10 MG tablet Take 1 tablet (10 mg total) by mouth daily for allergies 90 tablet 0  . Naltrexone  HCl, Pain, 4.5 MG CAPS Prescribed by psychiatry for depression. Once a day. 30 capsule 0  . neomycin -polymyxin-dexameth (MAXITROL ) 0.1 % OINT Apply a small amount on eyelid twice a day 3.5 g 2  . Polyethyl Glycol-Propyl Glycol (SYSTANE) 0.4-0.3 % SOLN Apply 1 drop to eye daily.    . progesterone  (PROMETRIUM ) 100 MG capsule Take 1 capsule (100 mg total) by mouth before bedtime for 1 week. May increase to 2 capsules to help with sleep. 90 capsule 3  . SYNTHROID  125 MCG tablet Take 1 tablet (125 mcg total) by mouth every Monday, Wednesday, and Friday. 40 tablet 2  . valACYclovir  (VALTREX ) 500 MG tablet Take 1 tablet (500 mg total) by mouth 2 (two) times daily. (Patient taking differently: Take 500 mg by mouth daily.) 180 tablet 2  . zaleplon  (SONATA ) 10 MG capsule Take 1 capsule (10 mg total) by mouth at bedtime as needed for sleep. 30 capsule 0   No current facility-administered medications for this visit.   No results found.  Review of Systems:   A ROS was performed including pertinent positives and negatives as documented in the HPI.  Physical Exam :   Constitutional: NAD and appears stated age Neurological: Alert and oriented Psych: Appropriate affect and cooperative There were no  vitals taken for this visit.   Comprehensive Musculoskeletal Exam:    Right shoulder forward elevation is 165 degrees bilaterally.  External rotation at the side is 60 degrees.  Internal  rotation is L1 bilaterally.  Positive Neer impingement distal neurosensory exam is intact  Imaging:   Xray (3 views right shoulder): Normal   I personally reviewed and interpreted the radiographs.   Assessment:   78 y.o. female with right shoulder pain consistent with possible subacromial bursitis.  I did describe that her x-rays today overall look quite good without evidence of rotator cuff arthropathy.  At this time I do believe it would be ideal to start with a subacromial injection and I will see her back in 6 weeks.  If she is still having pain we will consider an MRI at that time Plan :    - Right shoulder ultrasound-guided injection provided after verbal consent obtained    Procedure Note  Patient: Wendy Joyce             Date of Birth: 06-05-1946           MRN: [id number]             Visit Date: 09/18/2024  Procedures: Visit Diagnoses:  1. Chronic right shoulder pain     Large Joint Inj: R subacromial bursa on 09/18/2024 11:58 AM Indications: pain Details: 22 G 1.5 in needle, ultrasound-guided anterior approach  Arthrogram: No  Medications: 4 mL lidocaine  1 %; 80 mg triamcinolone  acetonide 40 MG/ML Outcome: tolerated well, no immediate complications Procedure, treatment alternatives, risks and benefits explained, specific risks discussed. Consent was given by the patient. Immediately prior to procedure a time out was called to verify the correct patient, procedure, equipment, support staff and site/side marked as required. Patient was prepped and draped in the usual sterile fashion.        I personally saw and evaluated the patient, and participated in the management and treatment plan.  Elspeth Parker, MD Attending Physician, Orthopedic Surgery  This document was dictated using Dragon voice recognition software. A reasonable attempt at proof reading has been made to minimize errors.

## 2024-09-19 ENCOUNTER — Encounter: Payer: Self-pay | Admitting: Family Medicine

## 2024-09-19 NOTE — Assessment & Plan Note (Signed)
 Severe. She has not tolerated several medications. Currently on natural supplements prescribed by functional med provider.

## 2024-09-19 NOTE — Assessment & Plan Note (Signed)
 She is on Metformin  500 mg 1/2 tab daily. Followed by Functional medicine provider.

## 2024-09-19 NOTE — Assessment & Plan Note (Signed)
 Stable overall. Follows with neurologist annually.

## 2024-09-19 NOTE — Assessment & Plan Note (Signed)
 Currently she is not on antidepressants. Follows with psychiatrist and psychotherapist.

## 2024-09-19 NOTE — Assessment & Plan Note (Signed)
 Chronic. She has seen cardiologist and pulmonologist. It seems to be related to seasonal allergies. Symbicort  160-4.5 mcg daily prn has helped.

## 2024-09-19 NOTE — Assessment & Plan Note (Signed)
 TSH has been abnormal, improving after adjustments of Levothyroxine . Currently on Levothyroxine  125 mcg 6 times per week. Follows with Functional Medicine provider.

## 2024-09-19 NOTE — Assessment & Plan Note (Signed)
 Takes Sonata  10 mg at bedtime as needed, not frequent. Continue good sleep hygiene.

## 2024-09-20 ENCOUNTER — Telehealth: Payer: Self-pay | Admitting: Family Medicine

## 2024-09-20 DIAGNOSIS — R682 Dry mouth, unspecified: Secondary | ICD-10-CM

## 2024-09-20 NOTE — Telephone Encounter (Signed)
 Copied from CRM #[id number]. Topic: Referral - Question >> Sep 20, 2024 10:37 AM Viola F wrote: Patient seen Dr. [place] yesterday 09/19/24, she would like a referral to [facility]/Dr. Tobie for dry mouth. Please call her when referral is in at [phone] (M)

## 2024-09-23 NOTE — Telephone Encounter (Signed)
 Referral placed per Dr. Gib verbal order. Spoke with patient and advised her referral has been placed. Patient voiced understanding.

## 2024-09-23 NOTE — Telephone Encounter (Signed)
 Referral can be placed. Diagnosis: Dry mouth and patient request. Thanks, BJ

## 2024-09-24 ENCOUNTER — Other Ambulatory Visit (HOSPITAL_BASED_OUTPATIENT_CLINIC_OR_DEPARTMENT_OTHER): Payer: Self-pay | Admitting: Family Medicine

## 2024-09-25 ENCOUNTER — Other Ambulatory Visit (HOSPITAL_BASED_OUTPATIENT_CLINIC_OR_DEPARTMENT_OTHER): Payer: Self-pay

## 2024-09-25 MED ORDER — COMIRNATY 30 MCG/0.3ML IM SUSY
0.3000 mL | PREFILLED_SYRINGE | Freq: Once | INTRAMUSCULAR | 0 refills | Status: AC
Start: 2024-09-25 — End: 2024-09-26
  Filled 2024-09-25: qty 0.3, 1d supply, fill #0

## 2024-09-25 MED ORDER — FLUZONE HIGH-DOSE 0.5 ML IM SUSY
0.5000 mL | PREFILLED_SYRINGE | Freq: Once | INTRAMUSCULAR | 0 refills | Status: AC
  Filled 2024-09-25: qty 0.5, 1d supply, fill #0

## 2024-09-30 ENCOUNTER — Other Ambulatory Visit (HOSPITAL_BASED_OUTPATIENT_CLINIC_OR_DEPARTMENT_OTHER): Payer: Self-pay

## 2024-09-30 ENCOUNTER — Other Ambulatory Visit (HOSPITAL_BASED_OUTPATIENT_CLINIC_OR_DEPARTMENT_OTHER): Payer: Self-pay | Admitting: Family Medicine

## 2024-09-30 MED ORDER — MONTELUKAST SODIUM 10 MG PO TABS
10.0000 mg | ORAL_TABLET | Freq: Every day | ORAL | 1 refills | Status: AC
  Filled 2024-09-30: qty 90, 90d supply, fill #0
  Filled 2025-01-22: qty 90, 90d supply, fill #1

## 2024-10-01 ENCOUNTER — Encounter: Payer: Self-pay | Admitting: Family Medicine

## 2024-10-04 ENCOUNTER — Other Ambulatory Visit (HOSPITAL_BASED_OUTPATIENT_CLINIC_OR_DEPARTMENT_OTHER): Payer: Self-pay

## 2024-10-04 ENCOUNTER — Other Ambulatory Visit (HOSPITAL_BASED_OUTPATIENT_CLINIC_OR_DEPARTMENT_OTHER): Payer: Self-pay | Admitting: Family Medicine

## 2024-10-04 DIAGNOSIS — F5101 Primary insomnia: Secondary | ICD-10-CM

## 2024-10-04 MED ORDER — MIEBO 1.338 GM/ML OP SOLN
1.0000 [drp] | Freq: Four times a day (QID) | OPHTHALMIC | 3 refills | Status: DC
  Filled 2024-10-04: qty 3, 7d supply, fill #0
  Filled 2024-11-25: qty 3, 7d supply, fill #1

## 2024-10-11 ENCOUNTER — Other Ambulatory Visit (HOSPITAL_BASED_OUTPATIENT_CLINIC_OR_DEPARTMENT_OTHER): Payer: Self-pay | Admitting: Family Medicine

## 2024-10-11 ENCOUNTER — Other Ambulatory Visit (HOSPITAL_BASED_OUTPATIENT_CLINIC_OR_DEPARTMENT_OTHER): Payer: Self-pay

## 2024-10-11 DIAGNOSIS — F5101 Primary insomnia: Secondary | ICD-10-CM

## 2024-10-11 MED ORDER — ZALEPLON 5 MG PO CAPS
5.0000 mg | ORAL_CAPSULE | Freq: Every evening | ORAL | 0 refills | Status: DC | PRN
  Filled 2024-10-11: qty 20, 20d supply, fill #0

## 2024-10-14 ENCOUNTER — Other Ambulatory Visit (HOSPITAL_BASED_OUTPATIENT_CLINIC_OR_DEPARTMENT_OTHER): Payer: Self-pay | Admitting: Family Medicine

## 2024-10-14 ENCOUNTER — Other Ambulatory Visit (HOSPITAL_BASED_OUTPATIENT_CLINIC_OR_DEPARTMENT_OTHER): Payer: Self-pay

## 2024-10-15 ENCOUNTER — Telehealth: Payer: Self-pay

## 2024-10-15 ENCOUNTER — Other Ambulatory Visit: Payer: Self-pay | Admitting: Family Medicine

## 2024-10-15 ENCOUNTER — Other Ambulatory Visit (HOSPITAL_BASED_OUTPATIENT_CLINIC_OR_DEPARTMENT_OTHER): Payer: Self-pay

## 2024-10-15 DIAGNOSIS — G4700 Insomnia, unspecified: Secondary | ICD-10-CM

## 2024-10-15 MED ORDER — ZALEPLON 10 MG PO CAPS
10.0000 mg | ORAL_CAPSULE | Freq: Every evening | ORAL | 1 refills | Status: AC | PRN
  Filled 2024-10-15 (×2): qty 30, 30d supply, fill #0
  Filled 2024-12-12: qty 30, 30d supply, fill #1

## 2024-10-15 NOTE — Telephone Encounter (Signed)
 Per patient, she did not pick up medication. Is requesting 10mg  RX sent to pharmacy.

## 2024-10-15 NOTE — Telephone Encounter (Signed)
 Refill for 5mg  sent on 10/11/2024. Patient states she is taking 10mg . Please advise. Per 09/17/2024 note, patient takes 10mg .     Copied from CRM #[id number]. Topic: Clinical - Prescription Issue >> Oct 15, 2024 10:09 AM Macario HERO wrote: Reason for CRM: Patient stated that she received the wrong dose of prescription: zaleplon  (SONATA ) 5 MG capsule [[id number]] - it should be 10MG  not 5MG . --  Patient requesting a call back with clarity.

## 2024-10-15 NOTE — Telephone Encounter (Signed)
New Rx sent. Thanks, BJ 

## 2024-10-15 NOTE — Telephone Encounter (Signed)
Patient notified new RX sent to pharmacy. 

## 2024-10-15 NOTE — Telephone Encounter (Signed)
 This was the order sent to me as a refill request. If she picked up Rx, she can take 2 at the same time and the next Rx will be 10 mg. Thanks, BJ

## 2024-10-16 ENCOUNTER — Other Ambulatory Visit (HOSPITAL_BASED_OUTPATIENT_CLINIC_OR_DEPARTMENT_OTHER): Payer: Self-pay

## 2024-10-16 MED ORDER — PROGESTERONE MICRONIZED 100 MG PO CAPS
100.0000 mg | ORAL_CAPSULE | Freq: Every day | ORAL | 3 refills | Status: AC
  Filled 2024-10-16: qty 90, 45d supply, fill #0
  Filled 2024-11-27: qty 90, 45d supply, fill #1
  Filled 2025-01-08: qty 90, 45d supply, fill #2

## 2024-10-23 ENCOUNTER — Ambulatory Visit (INDEPENDENT_AMBULATORY_CARE_PROVIDER_SITE_OTHER): Admitting: Orthopaedic Surgery

## 2024-10-23 ENCOUNTER — Other Ambulatory Visit (HOSPITAL_BASED_OUTPATIENT_CLINIC_OR_DEPARTMENT_OTHER): Payer: Self-pay

## 2024-10-23 DIAGNOSIS — M25511 Pain in right shoulder: Secondary | ICD-10-CM | POA: Diagnosis not present

## 2024-10-23 DIAGNOSIS — G8929 Other chronic pain: Secondary | ICD-10-CM | POA: Diagnosis not present

## 2024-10-23 NOTE — Progress Notes (Signed)
 Chief Complaint: Right shoulder pain     History of Present Illness:   10/23/2024: Right shoulder follow-up.  She get extremely good relief from her injection for approximately 6 to 8 weeks although this is subsequently worn off   Surgical History:   None  PMH/PSH/Family History/Social History/Meds/Allergies:    Past Medical History:  Diagnosis Date   Abdominal pain, generalized 01/29/2015   Abnormal thyroid  function test 02/25/2016   ADHD (attention deficit hyperactivity disorder)    Allergic rhinitis, unspecified 01/29/2015   Allergy     Anemia    DOE   Ankle sprain 03/16/2016   Anxiety 01/29/2015   Arthritis    hands   Chronic kidney disease 2012   kidney stones   Constipation 01/29/2015   Decreased libido 05/02/2018   Disorder of the skin and subcutaneous tissue, unspecified 01/29/2015   DJD (degenerative joint disease)    hand   Dysthymia    Endometriosis    External otitis    Family history of coronary artery disease 05/02/2018   Fatigue due to excessive exertion 02/24/2016   Fever blister    Fibromyalgia 01/29/2015   Frequency of micturition 01/29/2015   Functional dyspepsia 01/29/2015   Gastro-esophageal reflux disease without esophagitis 01/29/2015   Headache(784.0)    High cholesterol    Hyperlipidemia 09/12/2016   Hypersomnia    Hypothyroidism    IBS (irritable bowel syndrome) 05/02/2018   Insomnia    Internal hemorrhoids    Irritable 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 01/29/2015   Labial pain 11/30/2016   Lactose intolerance 12/14/2016   Lactose intolerance in adult 09/12/2016   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   Left ureteral stone    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   Major depressive disorder, single episode, unspecified    Migraine with aura, not intractable, without status migrainosus    Mixed hyperlipidemia    Mouth pain 01/07/2016   Osteoarthritis of hip    Osteopenia determined by x-ray  03/02/2017   Personal history of estrogen therapy    Phlebitis and thrombophlebitis of the leg    Pityriasis alba 01/29/2015   Pure hypercholesterolemia 06/29/2016   Rectum pain 09/12/2016   Recurrent sinus infections 02/24/2016   Rhinosinusitis 01/07/2016   Rosacea    Sinusitis 12/24/2015   Tinnitus of left ear 01/29/2015   Umbilical hernia without obstruction and without gangrene 01/29/2015   Umbilical pain 05/02/2018   Unspecified urinary incontinence 01/07/2016   Urticaria    UTI (urinary tract infection)    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   Vitamin D  deficiency    Past Surgical History:  Procedure Laterality Date   BREAST ENHANCEMENT SURGERY     BREAST REDUCTION SURGERY Bilateral 2018   COLONOSCOPY     COLONOSCOPY WITH PROPOFOL  N/A 02/25/2013   Procedure: COLONOSCOPY WITH PROPOFOL ;  Surgeon: Gladis MARLA Louder, MD;  Location: [facility];  Service: Endoscopy;  Laterality: N/A;   left hand surgery  12/28/2022   mini facelift  2000   NASAL SINUS SURGERY     REDUCTION MAMMAPLASTY     ROTATOR CUFF REPAIR     Right   ROTATOR CUFF REPAIR     Left   SEPTOPLASTY WITH ETHMOIDECTOMY, AND MAXILLARY ANTROSTOMY Bilateral 2006   TOTAL ABDOMINAL HYSTERECTOMY     ovaries remain  TUBAL LIGATION     umbicial hernia     Social History   Socioeconomic History   Marital status: Married    Spouse name: Not on file   Number of children: Not on file   Years of education: Not on file   Highest education level: Bachelor's degree (e.g., BA, AB, BS)  Occupational History   Not on file  Tobacco Use   Smoking status: Never   Smokeless tobacco: Never  Vaping Use   Vaping status: Never Used  Substance and Sexual Activity   Alcohol  use: Yes    Alcohol /week: 2.0 standard drinks of alcohol     Types: 2 Glasses of wine per week    Comment: weekends occ   Drug use: No    Comment: CBD cream for pain twice weekly   Sexual activity: Yes     Partners: Male    Birth control/protection: Post-menopausal  Other Topics Concern   Not on file  Social History Narrative   Married   Regular exercise: yes; 2 x a week with a trainer   Caffeine use: coffee daily   Social Drivers of Corporate Investment Banker Strain: Low Risk  (05/24/2024)   Overall Financial Resource Strain (CARDIA)    Difficulty of Paying Living Expenses: Not hard at all  Food Insecurity: No Food Insecurity (05/24/2024)   Hunger Vital Sign    Worried About Running Out of Food in the Last Year: Never true    Ran Out of Food in the Last Year: Never true  Transportation Needs: No Transportation Needs (05/24/2024)   PRAPARE - Administrator, Civil Service (Medical): No    Lack of Transportation (Non-Medical): No  Physical Activity: Sufficiently Active (05/24/2024)   Exercise Vital Sign    Days of Exercise per Week: 7 days    Minutes of Exercise per Session: 30 min  Stress: Stress Concern Present (05/24/2024)   [employer] of Occupational Health - Occupational Stress Questionnaire    Feeling of Stress : To some extent  Social Connections: Socially Integrated (05/24/2024)   Social Connection and Isolation Panel    Frequency of Communication with Friends and Family: More than three times a week    Frequency of Social Gatherings with Friends and Family: Twice a week    Attends Religious Services: More than 4 times per year    Active Member of [employer] or Organizations: Yes    Attends Engineer, Structural: More than 4 times per year    Marital Status: Married   Family History  Problem Relation Age of Onset   Stroke Father    Hypertension Father    Cancer Father    Breast cancer Maternal Grandmother    Colon cancer Neg Hx    Colon polyps Neg Hx    Esophageal cancer Neg Hx    Rectal cancer Neg Hx    Stomach cancer Neg Hx    Allergies  Allergen Reactions   Adhesive [Tape] Dermatitis   Atovaquone-Proguanil Hcl Other (See Comments)    Bioflavonoids    Citrus Nausea And Vomiting   Emedastine Nausea And Vomiting   Escitalopram Hives   Escitalopram Oxalate Hives   Ezetimibe-Simvastatin Other (See Comments)   Niacin Other (See Comments)   Niacin And Related     Rash and breathing breathing problems   Rosuvastatin Other (See Comments)   Versed  [Midazolam ] Nausea And Vomiting    For a week per pt   Amoxicillin Rash    Unsure  of reaction/ was instructed not to take drug. Unknown reaction    Hydrochlorothiazide Rash   Current Outpatient Medications  Medication Sig Dispense Refill   acyclovir  ointment (ZOVIRAX ) 5 % Apply 1 application. topically daily as needed (for fever blister).     ALPRAZolam  (XANAX ) 0.25 MG tablet Take 1-2 tablets (0.25-0.5 mg total) by mouth daily as needed. 60 tablet 2   benzonatate  (TESSALON ) 200 MG capsule Take 1 capsule (200 mg total) by mouth 3 (three) times daily as needed for cough. 21 capsule 0   budesonide -formoterol  (SYMBICORT ) 160-4.5 MCG/ACT inhaler Inhale 2 puffs into the lungs 2 (two) times daily. 10.2 g 0   estradiol  (VIVELLE -DOT) 0.025 MG/24HR Place 1 patch onto the skin on abdomen 2 (two) times a week. 16 patch 5   Influenza-SARS At Home Antigen (COVID-19 FLU A+B ANTIGEN TEST) KIT Use as directed. 1 kit 0   ipratropium (ATROVENT ) 0.06 % nasal spray Place 2 sprays into both nostrils 3 (three) times daily for 4 days, then stop. Not for long term use. 15 mL 0   levothyroxine  (SYNTHROID ) 125 MCG tablet Take 1 tablet (125 mcg total) by mouth in the morning on an empty stomach. Wait 1 hour to have food or coffee. 90 tablet 1   metFORMIN  (GLUCOPHAGE ) 500 MG tablet Take 0.5 tablet by mouth daily for 7 days, THEN 0.5 tablet twice daily for 7 days, THEN 1 tablet in AM and 0.5 tablet in PM for 7 days, THEN 1 tablet twice daily. (Patient taking differently: 250 mg. Take 0.5 tablet by mouth daily for 7 days, THEN 0.5 tablet twice daily for 7 days, THEN 1 tablet in AM and 0.5 tablet in PM for 7 days,  THEN 1 tablet twice daily.) 90 tablet 0   MIEBO 1.338 GM/ML SOLN Place 1 drop into both eyes in the morning, at noon, and at bedtime.     montelukast  (SINGULAIR ) 10 MG tablet Take 1 tablet (10 mg total) by mouth daily. During seasons symptoms are more frequent. 90 tablet 1   Naltrexone  HCl, Pain, 4.5 MG CAPS Prescribed by psychiatry for depression. Once a day. 30 capsule 0   neomycin -polymyxin-dexameth (MAXITROL ) 0.1 % OINT Apply a small amount on eyelid twice a day 3.5 g 2   Perfluorohexyloctane (MIEBO) 1.338 GM/ML SOLN Place 1 drop into both eyes 2 (two) to 4 (four) times daily. (Must see package insert for administration instructions.) 3 mL 3   Polyethyl Glycol-Propyl Glycol (SYSTANE) 0.4-0.3 % SOLN Apply 1 drop to eye daily.     progesterone  (PROMETRIUM ) 100 MG capsule Take 1 capsule (100 mg total) by mouth before bedtime for 1 week. May increase to 2 capsules to help with sleep. 90 capsule 3   SYNTHROID  125 MCG tablet Take 1 tablet (125 mcg total) by mouth every Monday, Wednesday, and Friday. 40 tablet 2   valACYclovir  (VALTREX ) 500 MG tablet Take 1 tablet (500 mg total) by mouth 2 (two) times daily. (Patient taking differently: Take 500 mg by mouth daily.) 180 tablet 2   zaleplon  (SONATA ) 10 MG capsule Take 1 capsule (10 mg total) by mouth at bedtime as needed for sleep. 30 capsule 1   No current facility-administered medications for this visit.   No results found.  Review of Systems:   A ROS was performed including pertinent positives and negatives as documented in the HPI.  Physical Exam :   Constitutional: NAD and appears stated age Neurological: Alert and oriented Psych: Appropriate affect and cooperative There were no  vitals taken for this visit.   Comprehensive Musculoskeletal Exam:    Right shoulder forward elevation is 165 degrees bilaterally.  External rotation at the side is 60 degrees.  Internal rotation is L1 bilaterally.  Positive Neer impingement distal neurosensory  exam is intact  Imaging:   Xray (3 views right shoulder): Normal   I personally reviewed and interpreted the radiographs.   Assessment:   78 y.o. female with right shoulder pain consistent with possible subacromial bursitis.  Unfortunately she is now having increased pain after most recent injection.  Given the fact that she has had prior surgery on the right shoulder and that she has now failed an injection I did describe that I would like to obtain an MRI of the right shoulder so that we can discuss treatment options I will see her back following Plan :    - Plan for MRI right shoulder and follow-up discuss results    I personally saw and evaluated the patient, and participated in the management and treatment plan.  Elspeth Parker, MD Attending Physician, Orthopedic Surgery  This document was dictated using Dragon voice recognition software. A reasonable attempt at proof reading has been made to minimize errors.

## 2024-10-24 ENCOUNTER — Other Ambulatory Visit (HOSPITAL_BASED_OUTPATIENT_CLINIC_OR_DEPARTMENT_OTHER): Payer: Self-pay

## 2024-10-28 ENCOUNTER — Encounter: Payer: Self-pay | Admitting: Radiology

## 2024-10-30 ENCOUNTER — Institutional Professional Consult (permissible substitution) (INDEPENDENT_AMBULATORY_CARE_PROVIDER_SITE_OTHER): Admitting: Otolaryngology

## 2024-10-30 DIAGNOSIS — K080 Exfoliation of teeth due to systemic causes: Secondary | ICD-10-CM | POA: Diagnosis not present

## 2024-10-31 ENCOUNTER — Other Ambulatory Visit (HOSPITAL_BASED_OUTPATIENT_CLINIC_OR_DEPARTMENT_OTHER): Payer: Self-pay

## 2024-10-31 ENCOUNTER — Ambulatory Visit (INDEPENDENT_AMBULATORY_CARE_PROVIDER_SITE_OTHER): Admitting: Otolaryngology

## 2024-10-31 ENCOUNTER — Other Ambulatory Visit: Payer: Self-pay

## 2024-10-31 ENCOUNTER — Encounter (INDEPENDENT_AMBULATORY_CARE_PROVIDER_SITE_OTHER): Payer: Self-pay | Admitting: Otolaryngology

## 2024-10-31 VITALS — BP 115/69 | HR 69 | Ht 62.6 in | Wt 135.0 lb

## 2024-10-31 DIAGNOSIS — K117 Disturbances of salivary secretion: Secondary | ICD-10-CM | POA: Diagnosis not present

## 2024-10-31 DIAGNOSIS — H04123 Dry eye syndrome of bilateral lacrimal glands: Secondary | ICD-10-CM | POA: Diagnosis not present

## 2024-10-31 MED ORDER — SALINE SPRAY 0.65 % NA SOLN
2.0000 | Freq: Four times a day (QID) | NASAL | 5 refills | Status: AC
  Filled 2024-10-31: qty 44, 110d supply, fill #0

## 2024-10-31 MED ORDER — PILOCARPINE HCL 5 MG PO TABS
5.0000 mg | ORAL_TABLET | Freq: Three times a day (TID) | ORAL | 1 refills | Status: AC
  Filled 2024-10-31: qty 90, 30d supply, fill #0
  Filled 2024-11-27: qty 90, 30d supply, fill #1

## 2024-10-31 NOTE — Patient Instructions (Addendum)
 Get labs at any [facility] that you'd like (there is one on [address]) Rinse/gargle with salt water before bed Use nasal saline spray  Stay hydrated, chew gum or eat candy Take salagen 5mg  tablet 3 times per day for 30 days.

## 2024-10-31 NOTE — Progress Notes (Signed)
 Dear Dr. Jordan, Here is my assessment for our mutual patient, Wendy Joyce. Thank you for allowing me the opportunity to care for your patient. Please do not hesitate to contact me should you have any other questions. Sincerely, Dr. Eldora Blanch  Otolaryngology Clinic Note Referring provider: Dr. Jordan HPI:  Initial visit (10/2024): Discussed the use of AI scribe software for clinical note transcription with the patient, who gave verbal consent to proceed.  History of Present Illness Wendy Joyce is a 78 year old female with Hashimoto's disease who presents with severe dry mouth.  She has experienced dry mouth for several years, with significant worsening over the past year. Her tongue sticks to the roof of her mouth, especially at night, disrupting her sleep. She uses cough drops or mints to keep her mouth hydrated during the day. Treatments attempted include Biotene mouthwash, and Xylamelts lozenges, but symptoms persist, particularly at night.  She also experiences dry eyes and some pain when swallowing. No shortness of breath or bloody cough is present.  She uses a saline nasal spray and occasionally Flonase. Atrovent  nasal spray is also used, which might contribute to nasal dryness. She denies any radiation to the head or neck or salivary gland procedures.  Her past medical history includes Hashimoto's disease. She has not been tested for Sjogren's disease and is unaware of any other autoimmune conditions or rashes/joint pains. She maintains her weight and tries to stay hydrated, although it does not alleviate her symptoms. No LPR/GERD sx  No h/o glaucoma  Patient otherwise denies: - dysphagia, odynophagia, need for Heimlich, unintentional weight loss - changes in voice, shortness of breath, hemoptysis - ear pain, neck masses  Personal or FHx of bleeding dz or anesthesia difficulty: no  Tobacco: no  Pmhx: Refractory Migraine, Tbplebitis, Hypothyroidism,  Fibromyalgia  Independent Review of Additional Tests or Records:  Dr. Gib office (09/20/2024): noted dry mouth, chronic; Dx: xerostomia; Rx: ref to [facility] CBC and CMP and TSH 10/2023: TSH 4.8; BUN/Cr 27/0.49; WBC 4.7, Eos 200  PMH/Meds/All/SocHx/FamHx/ROS:   Past Medical History:  Diagnosis Date   Abdominal pain, generalized 01/29/2015   Abnormal thyroid  function test 02/25/2016   ADHD (attention deficit hyperactivity disorder)    Allergic rhinitis, unspecified 01/29/2015   Allergy     Anemia    DOE   Ankle sprain 03/16/2016   Anxiety 01/29/2015   Arthritis    hands   Chronic kidney disease 2012   kidney stones   Constipation 01/29/2015   Decreased libido 05/02/2018   Disorder of the skin and subcutaneous tissue, unspecified 01/29/2015   DJD (degenerative joint disease)    hand   Dysthymia    Endometriosis    External otitis    Family history of coronary artery disease 05/02/2018   Fatigue due to excessive exertion 02/24/2016   Fever blister    Fibromyalgia 01/29/2015   Frequency of micturition 01/29/2015   Functional dyspepsia 01/29/2015   Gastro-esophageal reflux disease without esophagitis 01/29/2015   Headache(784.0)    High cholesterol    Hyperlipidemia 09/12/2016   Hypersomnia    Hypothyroidism    IBS (irritable bowel syndrome) 05/02/2018   Insomnia    Internal hemorrhoids    Irritable 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 01/29/2015   Labial pain 11/30/2016   Lactose intolerance 12/14/2016   Lactose intolerance in adult 09/12/2016   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   Left ureteral stone    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   Major depressive disorder, single episode, unspecified  Migraine with aura, not intractable, without status migrainosus    Mixed hyperlipidemia    Mouth pain 01/07/2016   Osteoarthritis of hip    Osteopenia determined by x-ray 03/02/2017   Personal history of estrogen therapy    Phlebitis and thrombophlebitis of the leg     Pityriasis alba 01/29/2015   Pure hypercholesterolemia 06/29/2016   Rectum pain 09/12/2016   Recurrent sinus infections 02/24/2016   Rhinosinusitis 01/07/2016   Rosacea    Sinusitis 12/24/2015   Tinnitus of left ear 01/29/2015   Umbilical hernia without obstruction and without gangrene 01/29/2015   Umbilical pain 05/02/2018   Unspecified urinary incontinence 01/07/2016   Urticaria    UTI (urinary tract infection)    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   Vitamin D  deficiency      Past Surgical History:  Procedure Laterality Date   BREAST ENHANCEMENT SURGERY     BREAST REDUCTION SURGERY Bilateral 2018   COLONOSCOPY     COLONOSCOPY WITH PROPOFOL  N/A 02/25/2013   Procedure: COLONOSCOPY WITH PROPOFOL ;  Surgeon: Gladis MARLA Louder, MD;  Location: [facility];  Service: Endoscopy;  Laterality: N/A;   left hand surgery  12/28/2022   mini facelift  2000   NASAL SINUS SURGERY     REDUCTION MAMMAPLASTY     ROTATOR CUFF REPAIR     Right   ROTATOR CUFF REPAIR     Left   SEPTOPLASTY WITH ETHMOIDECTOMY, AND MAXILLARY ANTROSTOMY Bilateral 2006   TOTAL ABDOMINAL HYSTERECTOMY     ovaries remain   TUBAL LIGATION     umbicial hernia      Family History  Problem Relation Age of Onset   Stroke Father    Hypertension Father    Cancer Father    Breast cancer Maternal Grandmother    Colon cancer Neg Hx    Colon polyps Neg Hx    Esophageal cancer Neg Hx    Rectal cancer Neg Hx    Stomach cancer Neg Hx      Social Connections: Socially Integrated (11/05/2024)   Social Connection and Isolation Panel    Frequency of Communication with Friends and Family: More than three times a week    Frequency of Social Gatherings with Friends and Family: Three times a week    Attends Religious Services: More than 4 times per year    Active Member of Clubs or Organizations: Yes    Attends Banker Meetings: More than 4 times per year    Marital Status:  Married      Current Outpatient Medications:    acyclovir  ointment (ZOVIRAX ) 5 %, Apply 1 application. topically daily as needed (for fever blister)., Disp: , Rfl:    ALPRAZolam  (XANAX ) 0.25 MG tablet, Take 1-2 tablets (0.25-0.5 mg total) by mouth daily as needed., Disp: 60 tablet, Rfl: 2   benzonatate  (TESSALON ) 200 MG capsule, Take 1 capsule (200 mg total) by mouth 3 (three) times daily as needed for cough., Disp: 21 capsule, Rfl: 0   budesonide -formoterol  (SYMBICORT ) 160-4.5 MCG/ACT inhaler, Inhale 2 puffs into the lungs 2 (two) times daily., Disp: 10.2 g, Rfl: 0   estradiol  (VIVELLE -DOT) 0.025 MG/24HR, Place 1 patch onto the skin on abdomen 2 (two) times a week., Disp: 16 patch, Rfl: 5   Influenza-SARS At Home Antigen (COVID-19 FLU A+B ANTIGEN TEST) KIT, Use as directed., Disp: 1 kit, Rfl: 0   ipratropium (ATROVENT ) 0.06 % nasal spray, Place 2 sprays into both nostrils 3 (three) times daily  for 4 days, then stop. Not for long term use., Disp: 15 mL, Rfl: 0   levothyroxine  (SYNTHROID ) 125 MCG tablet, Take 1 tablet (125 mcg total) by mouth in the morning on an empty stomach. Wait 1 hour to have food or coffee., Disp: 90 tablet, Rfl: 1   metFORMIN  (GLUCOPHAGE ) 500 MG tablet, Take 0.5 tablet by mouth daily for 7 days, THEN 0.5 tablet twice daily for 7 days, THEN 1 tablet in AM and 0.5 tablet in PM for 7 days, THEN 1 tablet twice daily. (Patient taking differently: Take 250 mg by mouth. Patient taking 1/2 of 500 mg daily), Disp: 90 tablet, Rfl: 0   MIEBO 1.338 GM/ML SOLN, Place 1 drop into both eyes in the morning, at noon, and at bedtime., Disp: , Rfl:    montelukast  (SINGULAIR ) 10 MG tablet, Take 1 tablet (10 mg total) by mouth daily. During seasons symptoms are more frequent., Disp: 90 tablet, Rfl: 1   Naltrexone  HCl, Pain, 4.5 MG CAPS, Prescribed by psychiatry for depression. Once a day., Disp: 30 capsule, Rfl: 0   neomycin -polymyxin-dexameth (MAXITROL ) 0.1 % OINT, Apply a small amount on eyelid  twice a day (Patient taking differently: Place 1 Application into both eyes as needed.), Disp: 3.5 g, Rfl: 2   Perfluorohexyloctane (MIEBO) 1.338 GM/ML SOLN, Place 1 drop into both eyes 2 (two) to 4 (four) times daily. (Must see package insert for administration instructions.), Disp: 3 mL, Rfl: 3   pilocarpine (SALAGEN) 5 MG tablet, Take 1 tablet (5 mg total) by mouth in the morning, at noon, and at bedtime., Disp: 90 tablet, Rfl: 1   Polyethyl Glycol-Propyl Glycol (SYSTANE) 0.4-0.3 % SOLN, Apply 1 drop to eye daily. (Patient taking differently: Apply 1 drop to eye as needed.), Disp: , Rfl:    progesterone  (PROMETRIUM ) 100 MG capsule, Take 1 capsule (100 mg total) by mouth before bedtime for 1 week. May increase to 2 capsules to help with sleep., Disp: 90 capsule, Rfl: 3   sodium chloride  (OCEAN) 0.65 % SOLN nasal spray, Place 2 sprays into both nostrils every 6 (six) hours., Disp: 44 mL, Rfl: 5   SYNTHROID  125 MCG tablet, Take 1 tablet (125 mcg total) by mouth every Monday, Wednesday, and Friday. (Patient taking differently: Take 125 mcg by mouth daily. Does not take on Sunday), Disp: 40 tablet, Rfl: 2   valACYclovir  (VALTREX ) 500 MG tablet, Take 1 tablet (500 mg total) by mouth 2 (two) times daily., Disp: 180 tablet, Rfl: 2   zaleplon  (SONATA ) 10 MG capsule, Take 1 capsule (10 mg total) by mouth at bedtime as needed for sleep., Disp: 30 capsule, Rfl: 1   Physical Exam:   BP 115/69 (BP Location: Left Arm, Patient Position: Sitting, Cuff Size: Normal)   Pulse 69   Ht 5' 2.6 (1.59 m)   Wt 135 lb (61.2 kg)   SpO2 93%   BMI 24.22 kg/m   Salient findings:  CN II-XII intact Bilateral EAC clear and TM intact with well pneumatized middle ear spaces Anterior rhinoscopy: Septum intact; no purulence; nasal cavity does not appear significantly dry No lesions of oral cavity/oropharynx; modest dryness, easily expressible saliva b/l parotid and submandibular ducts No obviously palpable neck  masses/lymphadenopathy/thyromegaly No respiratory distress or stridor  Seprately Identifiable Procedures:  Prior to initiating any procedures, risks/benefits/alternatives were explained to the patient and verbal consent obtained.    Impression & Plans:  Nakiea Metzner is a 78 y.o. female with:  1. Dry mouth and eyes  Chronic xerostomia with nocturnal exacerbation and associated dry eyes. We discussed wide DDX. Current management with biotin mouthwash and Xylamelts provides limited relief.  - Will check labs first to ensure No AI Dx/Sjogren's - Prescribed Salagen to increase saliva production - Advised gargling with salt water before bed. - Recommended nasal saline spray multiple times daily. - Encouraged hydration and candy to stimulate saliva. - Continue biotin mouthwash and Xylamelts. - Follow-up in two months to assess treatment efficacy.   See below regarding exact medications prescribed this encounter including dosages and route: Meds ordered this encounter  Medications   sodium chloride  (OCEAN) 0.65 % SOLN nasal spray    Sig: Place 2 sprays into both nostrils every 6 (six) hours.    Dispense:  44 mL    Refill:  5   pilocarpine (SALAGEN) 5 MG tablet    Sig: Take 1 tablet (5 mg total) by mouth in the morning, at noon, and at bedtime.    Dispense:  90 tablet    Refill:  1      Thank you for allowing me the opportunity to care for your patient. Please do not hesitate to contact me should you have any other questions.  Sincerely, Eldora Blanch, MD Otolaryngologist (ENT), [facility] Phone: [phone] Fax: [phone]  11/09/2024, 3:27 PM   MDM:  Level 4 - [phone] Complexity/Problems addressed: low Data complexity: mod - independent review of note, labs, ordering test/labs - Morbidity: mod  - Prescription Drug prescribed or managed: y

## 2024-11-04 ENCOUNTER — Other Ambulatory Visit (HOSPITAL_BASED_OUTPATIENT_CLINIC_OR_DEPARTMENT_OTHER): Payer: Self-pay

## 2024-11-04 ENCOUNTER — Encounter (HOSPITAL_BASED_OUTPATIENT_CLINIC_OR_DEPARTMENT_OTHER): Payer: Self-pay | Admitting: Orthopaedic Surgery

## 2024-11-05 ENCOUNTER — Ambulatory Visit

## 2024-11-05 VITALS — Ht 62.6 in | Wt 135.0 lb

## 2024-11-05 DIAGNOSIS — Z0000 Encounter for general adult medical examination without abnormal findings: Secondary | ICD-10-CM | POA: Diagnosis not present

## 2024-11-05 NOTE — Progress Notes (Signed)
 Subjective:   Wendy Joyce is a 78 y.o. female who presents for a Medicare Annual Wellness Visit.  Allergies (verified) Adhesive [tape], Atovaquone-proguanil hcl, Bioflavonoids, Citrus, Emedastine, Escitalopram, Escitalopram oxalate, Ezetimibe-simvastatin, Niacin, Niacin and related, Rosuvastatin, Versed  [midazolam ], Amoxicillin, and Hydrochlorothiazide   History: Past Medical History:  Diagnosis Date   Abdominal pain, generalized 01/29/2015   Abnormal thyroid  function test 02/25/2016   ADHD (attention deficit hyperactivity disorder)    Allergic rhinitis, unspecified 01/29/2015   Allergy     Anemia    DOE   Ankle sprain 03/16/2016   Anxiety 01/29/2015   Arthritis    hands   Chronic kidney disease 2012   kidney stones   Constipation 01/29/2015   Decreased libido 05/02/2018   Disorder of the skin and subcutaneous tissue, unspecified 01/29/2015   DJD (degenerative joint disease)    hand   Dysthymia    Endometriosis    External otitis    Family history of coronary artery disease 05/02/2018   Fatigue due to excessive exertion 02/24/2016   Fever blister    Fibromyalgia 01/29/2015   Frequency of micturition 01/29/2015   Functional dyspepsia 01/29/2015   Gastro-esophageal reflux disease without esophagitis 01/29/2015   Headache(784.0)    High cholesterol    Hyperlipidemia 09/12/2016   Hypersomnia    Hypothyroidism    IBS (irritable bowel syndrome) 05/02/2018   Insomnia    Internal hemorrhoids    Irritable 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 01/29/2015   Labial pain 11/30/2016   Lactose intolerance 12/14/2016   Lactose intolerance in adult 09/12/2016   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   Left ureteral stone    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   Major depressive disorder, single episode, unspecified    Migraine with aura, not intractable, without status migrainosus    Mixed hyperlipidemia    Mouth pain 01/07/2016   Osteoarthritis of hip    Osteopenia  determined by x-ray 03/02/2017   Personal history of estrogen therapy    Phlebitis and thrombophlebitis of the leg    Pityriasis alba 01/29/2015   Pure hypercholesterolemia 06/29/2016   Rectum pain 09/12/2016   Recurrent sinus infections 02/24/2016   Rhinosinusitis 01/07/2016   Rosacea    Sinusitis 12/24/2015   Tinnitus of left ear 01/29/2015   Umbilical hernia without obstruction and without gangrene 01/29/2015   Umbilical pain 05/02/2018   Unspecified urinary incontinence 01/07/2016   Urticaria    UTI (urinary tract infection)    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   Vitamin D  deficiency    Past Surgical History:  Procedure Laterality Date   BREAST ENHANCEMENT SURGERY     BREAST REDUCTION SURGERY Bilateral 2018   COLONOSCOPY     COLONOSCOPY WITH PROPOFOL  N/A 02/25/2013   Procedure: COLONOSCOPY WITH PROPOFOL ;  Surgeon: Gladis MARLA Louder, MD;  Location: [facility];  Service: Endoscopy;  Laterality: N/A;   left hand surgery  12/28/2022   mini facelift  2000   NASAL SINUS SURGERY     REDUCTION MAMMAPLASTY     ROTATOR CUFF REPAIR     Right   ROTATOR CUFF REPAIR     Left   SEPTOPLASTY WITH ETHMOIDECTOMY, AND MAXILLARY ANTROSTOMY Bilateral 2006   TOTAL ABDOMINAL HYSTERECTOMY     ovaries remain   TUBAL LIGATION     umbicial hernia     Family History  Problem Relation Age of Onset   Stroke Father    Hypertension Father    Cancer Father  Breast cancer Maternal Grandmother    Colon cancer Neg Hx    Colon polyps Neg Hx    Esophageal cancer Neg Hx    Rectal cancer Neg Hx    Stomach cancer Neg Hx    Social History   Occupational History   Not on file  Tobacco Use   Smoking status: Never   Smokeless tobacco: Never  Vaping Use   Vaping status: Never Used  Substance and Sexual Activity   Alcohol  use: Yes    Alcohol /week: 2.0 standard drinks of alcohol     Types: 2 Glasses of wine per week    Comment: weekends occ   Drug use:  No    Comment: CBD cream for pain twice weekly   Sexual activity: Yes    Partners: Male    Birth control/protection: Post-menopausal   Tobacco Counseling Counseling given: Not Answered  SDOH Screenings   Food Insecurity: No Food Insecurity (11/05/2024)  Housing: Low Risk  (11/05/2024)  Transportation Needs: No Transportation Needs (11/05/2024)  Utilities: Not At Risk (11/05/2024)  Alcohol  Screen: Low Risk  (11/05/2024)  Depression (PHQ2-9): Medium Risk (11/05/2024)  Financial Resource Strain: Low Risk  (11/05/2024)  Physical Activity: Sufficiently Active (11/05/2024)  Social Connections: Socially Integrated (11/05/2024)  Stress: Stress Concern Present (11/05/2024)  Tobacco Use: Low Risk  (10/31/2024)  Health Literacy: Adequate Health Literacy (11/05/2024)   Depression Screen    11/05/2024   11:12 AM 08/14/2023   11:50 AM 04/12/2023    1:12 PM 02/28/2023   10:40 AM 11/23/2022    2:48 PM 05/30/2022   10:57 AM 03/25/2022    9:05 AM  PHQ 2/9 Scores  PHQ - 2 Score 2 2 0 0 1 0 1  PHQ- 9 Score 5 9    6   0    Exception Documentation   Medical reason  Medical reason Medical reason Medical reason     Data saved with a previous flowsheet row definition      Goals Addressed               This Visit's Progress     Lose weight (pt-stated)   On track      Visit info / Clinical Intake: Medicare Wellness Visit Type:: Subsequent Annual Wellness Visit Persons participating in visit:: patient Medicare Wellness Visit Mode:: Video Because this visit was a virtual/telehealth visit:: vitals recorded from last visit If Telephone or Video please confirm:: I connected with the patient using audio enabled telemedicine application and verified that I am speaking with the correct person using two identifiers; I discussed the limitations of evaluation and management by telemedicine; The patient expressed understanding and agreed to proceed Patient Location:: Home Provider Location::  Home Information given by:: patient Interpreter Needed?: No Pre-visit prep was completed: no AWV questionnaire completed by patient prior to visit?: yes Date:: 11/05/24 Living arrangements:: lives with spouse/significant other Patient's Overall Health Status Rating: very good Typical amount of pain: none Does pain affect daily life?: no Are you currently prescribed opioids?: no  Dietary Habits and Nutritional Risks How many meals a day?: 3 Eats fruit and vegetables daily?: yes Most meals are obtained by: preparing own meals (eat out on weekends) Diabetic:: no  Functional Status Activities of Daily Living (to include ambulation/medication): (Patient-Rptd) Independent Ambulation: Independent Medication Administration: Independent Home Management: (Patient-Rptd) Independent Manage your own finances?: yes Primary transportation is: driving Concerns about vision?: no *vision screening is required for [facility]* Concerns about hearing?: (!) yes (Lt ear ringing/worsening) Uses hearing aids?: no  Hear whispered voice?: yes  Fall Screening Falls in the past year?: (Patient-Rptd) 0 Number of falls in past year: (Patient-Rptd) 0 Was there an injury with Fall?: (Patient-Rptd) 0 Fall Risk Category Calculator: (Patient-Rptd) 0 Patient Fall Risk Level: (Patient-Rptd) Low Fall Risk  Fall Risk Patient at Risk for Falls Due to: No Fall Risks Fall risk Follow up: Falls evaluation completed; Falls prevention discussed  Home and Transportation Safety: All rugs have non-skid backing?: yes All stairs or steps have railings?: yes Grab bars in the bathtub or shower?: yes Have non-skid surface in bathtub or shower?: yes Good home lighting?: yes Regular seat belt use?: yes Hospital stays in the last year:: no  Cognitive Assessment Difficulty concentrating, remembering, or making decisions? : yes (sometimes-per pt) Will 6CIT or Mini Cog be Completed: yes What year is it?: 0 points What month is  it?: 0 points Give patient an address phrase to remember (5 components): [address], [place] About what time is it?: 0 points Count backwards from 20 to 1: 0 points (10-1) Say the months of the year in reverse: 0 points Repeat the address phrase from earlier: 0 points 6 CIT Score: 0 points  Advance Directives (For Healthcare) Does Patient Have a Medical Advance Directive?: Yes Does patient want to make changes to medical advance directive?: No - Patient declined Type of Advance Directive: Healthcare Power of [place]; Out of facility DNR (pink MOST or yellow form) Copy of Healthcare Power of Attorney in Chart?: No - copy requested Copy of Living Will in Chart?: No - copy requested  Reviewed/Updated  Reviewed/Updated: Reviewed All (Medical, Surgical, Family, Medications, Allergies, Care Teams, Patient Goals)        Objective:    Today's Vitals   11/05/24 1042  Weight: 135 lb (61.2 kg)  Height: 5' 2.6 (1.59 m)   Body mass index is 24.22 kg/m.  Current Medications (verified) Outpatient Encounter Medications as of 11/05/2024  Medication Sig   acyclovir  ointment (ZOVIRAX ) 5 % Apply 1 application. topically daily as needed (for fever blister).   ALPRAZolam  (XANAX ) 0.25 MG tablet Take 1-2 tablets (0.25-0.5 mg total) by mouth daily as needed.   benzonatate  (TESSALON ) 200 MG capsule Take 1 capsule (200 mg total) by mouth 3 (three) times daily as needed for cough.   budesonide -formoterol  (SYMBICORT ) 160-4.5 MCG/ACT inhaler Inhale 2 puffs into the lungs 2 (two) times daily.   estradiol  (VIVELLE -DOT) 0.025 MG/24HR Place 1 patch onto the skin on abdomen 2 (two) times a week.   levothyroxine  (SYNTHROID ) 125 MCG tablet Take 1 tablet (125 mcg total) by mouth in the morning on an empty stomach. Wait 1 hour to have food or coffee.   metFORMIN  (GLUCOPHAGE ) 500 MG tablet Take 0.5 tablet by mouth daily for 7 days, THEN 0.5 tablet twice daily for 7 days, THEN 1 tablet in AM and 0.5 tablet in PM  for 7 days, THEN 1 tablet twice daily. (Patient taking differently: Take 250 mg by mouth. Patient taking 1/2 of 500 mg daily)   MIEBO 1.338 GM/ML SOLN Place 1 drop into both eyes in the morning, at noon, and at bedtime.   montelukast  (SINGULAIR ) 10 MG tablet Take 1 tablet (10 mg total) by mouth daily. During seasons symptoms are more frequent.   Naltrexone  HCl, Pain, 4.5 MG CAPS Prescribed by psychiatry for depression. Once a day.   neomycin -polymyxin-dexameth (MAXITROL ) 0.1 % OINT Apply a small amount on eyelid twice a day (Patient taking differently: Place 1 Application into both eyes  as needed.)   Perfluorohexyloctane (MIEBO) 1.338 GM/ML SOLN Place 1 drop into both eyes 2 (two) to 4 (four) times daily. (Must see package insert for administration instructions.)   pilocarpine (SALAGEN) 5 MG tablet Take 1 tablet (5 mg total) by mouth in the morning, at noon, and at bedtime.   Polyethyl Glycol-Propyl Glycol (SYSTANE) 0.4-0.3 % SOLN Apply 1 drop to eye daily. (Patient taking differently: Apply 1 drop to eye as needed.)   progesterone  (PROMETRIUM ) 100 MG capsule Take 1 capsule (100 mg total) by mouth before bedtime for 1 week. May increase to 2 capsules to help with sleep.   sodium chloride  (OCEAN) 0.65 % SOLN nasal spray Place 2 sprays into both nostrils every 6 (six) hours.   SYNTHROID  125 MCG tablet Take 1 tablet (125 mcg total) by mouth every Monday, Wednesday, and Friday. (Patient taking differently: Take 125 mcg by mouth daily. Does not take on Sunday)   valACYclovir  (VALTREX ) 500 MG tablet Take 1 tablet (500 mg total) by mouth 2 (two) times daily.   zaleplon  (SONATA ) 10 MG capsule Take 1 capsule (10 mg total) by mouth at bedtime as needed for sleep.   Influenza-SARS At Home Antigen (COVID-19 FLU A+B ANTIGEN TEST) KIT Use as directed.   ipratropium (ATROVENT ) 0.06 % nasal spray Place 2 sprays into both nostrils 3 (three) times daily for 4 days, then stop. Not for long term use.   [DISCONTINUED]  donepezil  (ARICEPT ) 5 MG tablet Take 1 tablet (5 mg total) by mouth 2 (two) times daily.   [DISCONTINUED] prazosin  (MINIPRESS ) 2 MG capsule 1 TO 3 PO QHS (Patient not taking: Reported on 10/31/2024)   [DISCONTINUED] Vilazodone  HCl (VIIBRYD ) 10 MG TABS 1 PO QD W/DINNER   No facility-administered encounter medications on file as of 11/05/2024.   Hearing/Vision screen Hearing Screening - Comments:: Lt ear ringing/worsening Vision Screening - Comments:: Had cataract surgery/[facility]/UTD Immunizations and Health Maintenance Health Maintenance  Topic Date Due   Medicare Annual Wellness (AWV)  03/21/2025   COVID-19 Vaccine (7 - Pfizer risk 2025-26 season) 03/26/2025   Colonoscopy  07/17/2030   DTaP/Tdap/Td (3 - Td or Tdap) 04/11/2033   Pneumococcal Vaccine: 50+ Years  Completed   Influenza Vaccine  Completed   DEXA SCAN  Completed   Zoster Vaccines- Shingrix  Completed   Meningococcal B Vaccine  Aged Out   Hepatitis B Vaccines 19-59 Average Risk  Discontinued   Mammogram  Discontinued   Hepatitis C Screening  Discontinued        Assessment/Plan:  This is a routine wellness examination for [facility].  Patient Care Team: Jordan, Betty G, MD as PCP - General (Family Medicine) Jeffrie Oneil BROCKS, MD as PCP - Cardiology (Cardiology) Gail Bergeron, MD as Referring Physician (Family Medicine) Pa, [facility]  I have personally reviewed and noted the following in the patient's chart:   Medical and social history Use of alcohol , tobacco or illicit drugs  Current medications and supplements including opioid prescriptions. Functional ability and status Nutritional status Physical activity Advanced directives List of other physicians Hospitalizations, surgeries, and ER visits in previous 12 months Vitals Screenings to include cognitive, depression, and falls Referrals and appointments  No orders of the defined types were placed in this encounter.  In addition, I have  reviewed and discussed with patient certain preventive protocols, quality metrics, and best practice recommendations. A written personalized care plan for preventive services as well as general preventive health recommendations were provided to patient.   Imaad Reuss L  Tashaun Obey, CMA   11/05/2024   No follow-ups on file.  After Visit Summary: (MyChart) Due to this being a telephonic visit, the after visit summary with patients personalized plan was offered to patient via MyChart   Nurse Notes: Patient stated that she has had a UTI x 2 days now and that she had started a OTC medication, AZO.  Patient has taken the medication x 2 days as directed and she stated that she has no symptoms today.  I informed patient that I would put information in note today and also advised her that if the symptoms come back or worsen then to contact provider via Mychart.  Patient also wanted the provider to know that she has recently had pneumonia in August of this year and that she is now having a cough at night starting about 2 weeks ago.  I advised patient to the Tessalon  200 mg PRN, Rx that she has and if cough worsens or does not cease, then to send a message to provider to let PCP know and to be evaluated for cough.

## 2024-11-05 NOTE — Patient Instructions (Addendum)
 Wendy Joyce,  Thank you for taking the time for your Medicare Wellness Visit. I appreciate your continued commitment to your health goals. Please review the care plan we discussed, and feel free to reach out if I can assist you further.  Please note that Annual Wellness Visits do not include a physical exam. Some assessments may be limited, especially if the visit was conducted virtually. If needed, we may recommend an in-person follow-up with your provider.  Ongoing Care Seeing your primary care provider every 3 to 6 months helps us  monitor your health and provide consistent, personalized care. Last office visit on 09/17/2024.  Keep up the good work.  Remember to send a message via Mychart if any of your symptoms persist or worsen.    Referrals If a referral was made during today's visit and you haven't received any updates within two weeks, please contact the referred provider directly to check on the status.  Recommended Screenings:  Health Maintenance  Topic Date Due   COVID-19 Vaccine (7 - Pfizer risk 2025-26 season) 03/26/2025   Medicare Annual Wellness Visit  11/05/2025   Colon Cancer Screening  07/17/2030   DTaP/Tdap/Td vaccine (3 - Td or Tdap) 04/11/2033   Pneumococcal Vaccine for age over 32  Completed   Flu Shot  Completed   DEXA scan (bone density measurement)  Completed   Zoster (Shingles) Vaccine  Completed   Meningitis B Vaccine  Aged Out   Hepatitis B Vaccine  Discontinued   Breast Cancer Screening  Discontinued   Hepatitis C Screening  Discontinued       11/05/2024   10:38 AM  Advanced Directives  Does Patient Have a Medical Advance Directive? Yes  Type of Estate Agent of [place];Out of facility DNR (pink MOST or yellow form)    Vision: Annual vision screenings are recommended for early detection of glaucoma, cataracts, and diabetic retinopathy. These exams can also reveal signs of chronic conditions such as diabetes and high blood  pressure.  Dental: Annual dental screenings help detect early signs of oral cancer, gum disease, and other conditions linked to overall health, including heart disease and diabetes.  Please see the attached documents for additional preventive care recommendations.

## 2024-11-06 DIAGNOSIS — H04123 Dry eye syndrome of bilateral lacrimal glands: Secondary | ICD-10-CM | POA: Diagnosis not present

## 2024-11-06 DIAGNOSIS — R682 Dry mouth, unspecified: Secondary | ICD-10-CM | POA: Diagnosis not present

## 2024-11-07 DIAGNOSIS — H16223 Keratoconjunctivitis sicca, not specified as Sjogren's, bilateral: Secondary | ICD-10-CM | POA: Diagnosis not present

## 2024-11-07 LAB — RHEUMATOID FACTOR: Rheumatoid fact SerPl-aCnc: <10 [IU]/mL (ref <14.0)

## 2024-11-07 LAB — SJOGRENS SYNDROME-A EXTRACTABLE NUCLEAR ANTIBODY: ENA SSA (RO) Ab: <0.2 AI (ref 0.0–0.9)

## 2024-11-07 LAB — SJOGRENS SYNDROME-B EXTRACTABLE NUCLEAR ANTIBODY: ENA SSB (LA) Ab: <0.2 AI (ref 0.0–0.9)

## 2024-11-07 LAB — ANA: Anti Nuclear Antibody (ANA): NEGATIVE

## 2024-11-08 ENCOUNTER — Ambulatory Visit: Payer: Self-pay

## 2024-11-08 NOTE — Telephone Encounter (Signed)
 FYI Only or Action Required?: Action required by provider: request for appointment and clinical question for provider.  Patient was last seen in primary care on 09/17/2024 by Jordan, Betty G, MD.  Called Nurse Triage reporting Cough and Dysuria.  Symptoms began 2 weeks.  Interventions attempted: OTC medications: AZO and Vagisil and Other: Tessalon  (previous prescription).  Symptoms are: gradually worsening.  Triage Disposition: See Physician Within 24 Hours  Patient/caregiver understands and will follow disposition?: No, wishes to speak with PCP                            Copied from CRM #[id number]. Topic: Clinical - Red Word Triage >> Nov 08, 2024  9:03 AM Larissa RAMAN wrote: [facility] that prompted transfer to Nurse Triage: cough w/ congestion, burning with urination- Worsening  Reason for Disposition  Age > 50 years  Answer Assessment - Initial Assessment Questions 1. ONSET: When did the cough begin?      2 weeks 2. SEVERITY: How bad is the cough today?      Moderate-severe, states cough interrupts sleep 3. SPUTUM: Describe the color of your sputum (e.g., none, dry cough; clear, white, yellow, green)     Unsure 4. HEMOPTYSIS: Are you coughing up any blood? If Yes, ask: How much? (e.g., flecks, streaks, tablespoons, etc.)     Denies 5. DIFFICULTY BREATHING: Are you having difficulty breathing? If Yes, ask: How bad is it? (e.g., mild, moderate, severe)      Denies 6. FEVER: Do you have a fever? If Yes, ask: What is your temperature, how was it measured, and when did it start?     Denies, states her face is flushed/red 7. CARDIAC HISTORY: Do you have any history of heart disease? (e.g., heart attack, congestive heart failure)      Denies 8. LUNG HISTORY: Do you have any history of lung disease?  (e.g., pulmonary embolus, asthma, emphysema)     PNA in August, history of DOE 9. PE RISK FACTORS: Do you have a history of blood clots? (or:  recent major surgery, recent prolonged travel, bedridden)     Denies history of blood clots 10. OTHER SYMPTOMS: Do you have any other symptoms? (e.g., runny nose, wheezing, chest pain)     Vague wheezing, denies chest pain 11. PREGNANCY: Is there any chance you are pregnant? When was your last menstrual period?     N/A 12. TRAVEL: Have you traveled out of the country in the last month? (e.g., travel history, exposures)     N/A  Answer Assessment - Initial Assessment Questions This RN advised in-person evaluation today, for both cough and urinary symptoms. No availability in PCP office or alternate offices within region. This RN advised UC. Patient declined and stated she only wanted to be seen in her PCP office. Patient asked this RN to ask PCP if she would be willing to prescribe an antibiotic for urinary symptoms, until she can come in for an appointment. Please advise. Patient would like a call back.     1. SEVERITY: How bad is the pain?  (e.g., Scale 1-10; mild, moderate, or severe)     Mild, rates pain 2-3 2. FREQUENCY: How many times have you had painful urination today?      N/A 3. PATTERN: Is pain present every time you urinate or just sometimes?      Every time 4. ONSET: When did the painful urination start?  Last weekend 5. FEVER: Do you have a fever? If Yes, ask: What is your temperature, how was it measured, and when did it start?     Denies 6. PAST UTI: Have you had a urine infection before? If Yes, ask: When was the last time? and What happened that time?      A year ago  7. CAUSE: What do you think is causing the painful urination?  (e.g., UTI, scratch, Herpes sore)     UTI 8. OTHER SYMPTOMS: Do you have any other symptoms? (e.g., blood in urine, flank pain, genital sores, urgency, vaginal discharge)     Urinary urgency, vaginal itching, denies flank pain, denies abdominal pain, denies hematuria 9. PREGNANCY: Is there any chance you  are pregnant? When was your last menstrual period?     N/A  Protocols used: Urination Pain - Female-A-AH, Cough - Acute Productive-A-AH

## 2024-11-09 ENCOUNTER — Ambulatory Visit (INDEPENDENT_AMBULATORY_CARE_PROVIDER_SITE_OTHER): Payer: Self-pay | Admitting: Otolaryngology

## 2024-11-11 ENCOUNTER — Encounter: Payer: Self-pay | Admitting: Family Medicine

## 2024-11-11 ENCOUNTER — Other Ambulatory Visit: Payer: Self-pay

## 2024-11-11 ENCOUNTER — Ambulatory Visit (INDEPENDENT_AMBULATORY_CARE_PROVIDER_SITE_OTHER): Admitting: Family Medicine

## 2024-11-11 ENCOUNTER — Ambulatory Visit
Admission: RE | Admit: 2024-11-11 | Discharge: 2024-11-11 | Disposition: A | Source: Ambulatory Visit | Attending: Orthopaedic Surgery

## 2024-11-11 ENCOUNTER — Other Ambulatory Visit (HOSPITAL_BASED_OUTPATIENT_CLINIC_OR_DEPARTMENT_OTHER): Payer: Self-pay

## 2024-11-11 ENCOUNTER — Ambulatory Visit (INDEPENDENT_AMBULATORY_CARE_PROVIDER_SITE_OTHER)

## 2024-11-11 VITALS — BP 120/70 | HR 69 | Temp 97.9°F | Resp 16 | Ht 62.6 in | Wt 132.6 lb

## 2024-11-11 DIAGNOSIS — G8929 Other chronic pain: Secondary | ICD-10-CM

## 2024-11-11 DIAGNOSIS — Z7184 Encounter for health counseling related to travel: Secondary | ICD-10-CM | POA: Diagnosis not present

## 2024-11-11 DIAGNOSIS — R300 Dysuria: Secondary | ICD-10-CM | POA: Diagnosis not present

## 2024-11-11 DIAGNOSIS — R059 Cough, unspecified: Secondary | ICD-10-CM

## 2024-11-11 DIAGNOSIS — M67813 Other specified disorders of tendon, right shoulder: Secondary | ICD-10-CM | POA: Diagnosis not present

## 2024-11-11 LAB — POCT URINALYSIS DIPSTICK
Bilirubin, UA: NEGATIVE
Blood, UA: NEGATIVE
Glucose, UA: NEGATIVE
Ketones, UA: NEGATIVE
Nitrite, UA: NEGATIVE
Protein, UA: NEGATIVE
Spec Grav, UA: 1.015 (ref 1.010–1.025)
Urobilinogen, UA: 0.2 U/dL
pH, UA: 7 (ref 5.0–8.0)

## 2024-11-11 MED ORDER — TERCONAZOLE 0.4 % VA CREA
1.0000 | TOPICAL_CREAM | Freq: Every day | VAGINAL | 0 refills | Status: AC
  Filled 2024-11-11: qty 45, 10d supply, fill #0

## 2024-11-11 MED ORDER — TYPHOID VACCINE PO CPDR
1.0000 | DELAYED_RELEASE_CAPSULE | ORAL | 0 refills | Status: AC
  Filled 2024-11-11: qty 4, 8d supply, fill #0

## 2024-11-11 NOTE — Progress Notes (Signed)
 ACUTE VISIT Chief Complaint  Patient presents with   Cough    Pt reports cough at night. Had pneumonia in August. Wakes her up at night.  Creepy sound, rattly  when cough.  Going to [place], want to make sure she is fine. Also would like to discuss typhoid injection for trip. Sx started 2 wks ago.    Dysuria    Pt reports itching, pain and burning when urinate. Sx about a wk ago. Took OTC- generic brand of azo.   Feels better today.     Discussed the use of AI scribe software for clinical note transcription with the patient, who gave verbal consent to proceed.  History of Present Illness Wendy Joyce is a 78 year old female with PMHx significant for chronic pain, fibromyalgia, anxiety, insomnia, hashimoto's hypothyroidism, and migraine headaches here today c/o burning and itching during urination as described above.  She has been experiencing dysuria and vaginal pruritus for the past week. There is no gross hematuria, but she notes urgency and increased frequency. She describes a unique sensation of a 'rush' in her fingernails when urinating, which she has experienced before during urinary tract infections. No vaginal discharge or bleeding is present. She has taken over-the-counter Azo for symptom relief. No associated fever, chills, abdominal pain, or unusual back pain.  -She has had a cough for the past couple of weeks, which is concerning due to a history of pneumonia in August. The cough occurs primarily at night, waking her up, and is described as rough, sometimes accompanied by phlegm but no hemoptysis. Wheezing occurs during coughing episodes. She has been using benzonatate  for the cough, which was prescribed during her previous pneumonia episode. She has not been using her Symbicort  inhaler recently, which she uses as needed for DOE.  She mentions experiencing heartburn or acid reflux a couple of nights but not consistently.  She is also inquiring about typhoid  vaccination. Planning on vacation to [place] in 11/2024. She is going to visit 2 cities as part of a river cruise. According to pt, she was told she could ge it if she wanted to do so.  Review of Systems  Constitutional:  Negative for activity change and appetite change.  HENT:  Negative for sore throat.   Cardiovascular:  Negative for chest pain.  Gastrointestinal:  Negative for nausea and vomiting.  Genitourinary:  Negative for decreased urine volume and flank pain.  Neurological:  Negative for syncope and weakness.  Psychiatric/Behavioral:  Negative for confusion and hallucinations.   See other pertinent positives and negatives in HPI.  Current Outpatient Medications on File Prior to Visit  Medication Sig Dispense Refill   acyclovir  ointment (ZOVIRAX ) 5 % Apply 1 application. topically daily as needed (for fever blister).     ALPRAZolam  (XANAX ) 0.25 MG tablet Take 1-2 tablets (0.25-0.5 mg total) by mouth daily as needed. 60 tablet 2   benzonatate  (TESSALON ) 200 MG capsule Take 1 capsule (200 mg total) by mouth 3 (three) times daily as needed for cough. 21 capsule 0   budesonide -formoterol  (SYMBICORT ) 160-4.5 MCG/ACT inhaler Inhale 2 puffs into the lungs 2 (two) times daily. 10.2 g 0   estradiol  (VIVELLE -DOT) 0.025 MG/24HR Place 1 patch onto the skin on abdomen 2 (two) times a week. 16 patch 5   Influenza-SARS At Home Antigen (COVID-19 FLU A+B ANTIGEN TEST) KIT Use as directed. 1 kit 0   ipratropium (ATROVENT ) 0.06 % nasal spray Place 2 sprays into both nostrils 3 (three) times daily  for 4 days, then stop. Not for long term use. 15 mL 0   levothyroxine  (SYNTHROID ) 125 MCG tablet Take 1 tablet (125 mcg total) by mouth in the morning on an empty stomach. Wait 1 hour to have food or coffee. 90 tablet 1   metFORMIN  (GLUCOPHAGE ) 500 MG tablet Take 0.5 tablet by mouth daily for 7 days, THEN 0.5 tablet twice daily for 7 days, THEN 1 tablet in AM and 0.5 tablet in PM for 7 days, THEN 1 tablet twice  daily. (Patient taking differently: Take 250 mg by mouth. Patient taking 1/2 of 500 mg daily) 90 tablet 0   MIEBO 1.338 GM/ML SOLN Place 1 drop into both eyes in the morning, at noon, and at bedtime.     montelukast  (SINGULAIR ) 10 MG tablet Take 1 tablet (10 mg total) by mouth daily. During seasons symptoms are more frequent. 90 tablet 1   Naltrexone  HCl, Pain, 4.5 MG CAPS Prescribed by psychiatry for depression. Once a day. 30 capsule 0   neomycin -polymyxin-dexameth (MAXITROL ) 0.1 % OINT Apply a small amount on eyelid twice a day (Patient taking differently: Place 1 Application into both eyes as needed.) 3.5 g 2   Perfluorohexyloctane (MIEBO) 1.338 GM/ML SOLN Place 1 drop into both eyes 2 (two) to 4 (four) times daily. (Must see package insert for administration instructions.) 3 mL 3   pilocarpine (SALAGEN) 5 MG tablet Take 1 tablet (5 mg total) by mouth in the morning, at noon, and at bedtime. 90 tablet 1   Polyethyl Glycol-Propyl Glycol (SYSTANE) 0.4-0.3 % SOLN Apply 1 drop to eye daily. (Patient taking differently: Apply 1 drop to eye as needed.)     progesterone  (PROMETRIUM ) 100 MG capsule Take 1 capsule (100 mg total) by mouth before bedtime for 1 week. May increase to 2 capsules to help with sleep. 90 capsule 3   sodium chloride  (OCEAN) 0.65 % SOLN nasal spray Place 2 sprays into both nostrils every 6 (six) hours. 44 mL 5   SYNTHROID  125 MCG tablet Take 1 tablet (125 mcg total) by mouth every Monday, Wednesday, and Friday. (Patient taking differently: Take 125 mcg by mouth daily. Does not take on Sunday) 40 tablet 2   valACYclovir  (VALTREX ) 500 MG tablet Take 1 tablet (500 mg total) by mouth 2 (two) times daily. 180 tablet 2   zaleplon  (SONATA ) 10 MG capsule Take 1 capsule (10 mg total) by mouth at bedtime as needed for sleep. 30 capsule 1   [DISCONTINUED] donepezil  (ARICEPT ) 5 MG tablet Take 1 tablet (5 mg total) by mouth 2 (two) times daily. 180 tablet 1   [DISCONTINUED] prazosin  (MINIPRESS ) 2  MG capsule 1 TO 3 PO QHS (Patient not taking: Reported on 10/31/2024) 90 capsule 5   [DISCONTINUED] Vilazodone  HCl (VIIBRYD ) 10 MG TABS 1 PO QD W/DINNER 90 tablet 3   No current facility-administered medications on file prior to visit.    Past Medical History:  Diagnosis Date   Abdominal pain, generalized 01/29/2015   Abnormal thyroid  function test 02/25/2016   ADHD (attention deficit hyperactivity disorder)    Allergic rhinitis, unspecified 01/29/2015   Allergy     Anemia    DOE   Ankle sprain 03/16/2016   Anxiety 01/29/2015   Arthritis    hands   Chronic kidney disease 2012   kidney stones   Constipation 01/29/2015   Decreased libido 05/02/2018   Disorder of the skin and subcutaneous tissue, unspecified 01/29/2015   DJD (degenerative joint disease)    hand  Dysthymia    Endometriosis    External otitis    Family history of coronary artery disease 05/02/2018   Fatigue due to excessive exertion 02/24/2016   Fever blister    Fibromyalgia 01/29/2015   Frequency of micturition 01/29/2015   Functional dyspepsia 01/29/2015   Gastro-esophageal reflux disease without esophagitis 01/29/2015   Headache(784.0)    High cholesterol    Hyperlipidemia 09/12/2016   Hypersomnia    Hypothyroidism    IBS (irritable bowel syndrome) 05/02/2018   Insomnia    Internal hemorrhoids    Irritable 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 01/29/2015   Labial pain 11/30/2016   Lactose intolerance 12/14/2016   Lactose intolerance in adult 09/12/2016   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   Left ureteral stone    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   Major depressive disorder, single episode, unspecified    Migraine with aura, not intractable, without status migrainosus    Mixed hyperlipidemia    Mouth pain 01/07/2016   Osteoarthritis of hip    Osteopenia determined by x-ray 03/02/2017   Personal history of estrogen therapy    Phlebitis and thrombophlebitis of the leg    Pityriasis alba 01/29/2015    Pure hypercholesterolemia 06/29/2016   Rectum pain 09/12/2016   Recurrent sinus infections 02/24/2016   Rhinosinusitis 01/07/2016   Rosacea    Sinusitis 12/24/2015   Tinnitus of left ear 01/29/2015   Umbilical hernia without obstruction and without gangrene 01/29/2015   Umbilical pain 05/02/2018   Unspecified urinary incontinence 01/07/2016   Urticaria    UTI (urinary tract infection)    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 Unknown reaction    Hydrochlorothiazide Rash    Social History   Socioeconomic History   Marital status: Married    Spouse name: Not on file   Number of children: Not on file   Years of education: Not on file   Highest education level: Bachelor's degree (e.g., BA, AB, BS)  Occupational History   Not on file  Tobacco Use   Smoking status: Never   Smokeless tobacco: Never  Vaping Use   Vaping status: Never Used  Substance and Sexual Activity   Alcohol  use: Yes    Alcohol /week: 2.0 standard drinks of alcohol     Types: 2 Glasses of wine per week    Comment: weekends occ   Drug use: No    Comment: CBD cream for pain twice weekly   Sexual activity: Yes    Partners: Male    Birth control/protection: Post-menopausal  Other Topics Concern   Not on file  Social History Narrative   Married   Regular exercise: yes; 2 x a week with a trainer    Caffeine use: coffee daily   Social Drivers of Corporate Investment Banker Strain: Low Risk  (11/05/2024)   Overall Physicist, Medical  Strain (CARDIA)    Difficulty of Paying Living Expenses: Not hard at all  Food Insecurity: No Food Insecurity (11/05/2024)   Hunger Vital Sign    Worried About Running Out of Food in the Last Year: Never true    Ran Out of Food in the Last Year: Never true  Transportation Needs: No Transportation Needs (11/05/2024)   PRAPARE - Administrator, Civil Service (Medical): No    Lack of Transportation (Non-Medical): No  Physical Activity: Sufficiently Active (11/05/2024)   Exercise Vital Sign    Days of Exercise per Week: 7 days    Minutes of Exercise per Session: 30 min  Stress: Stress Concern Present (11/05/2024)   [employer] of Occupational Health - Occupational Stress Questionnaire    Feeling of Stress: To some extent  Social Connections: Socially Integrated (11/05/2024)   Social Connection and Isolation Panel    Frequency of Communication with Friends and Family: More than three times a week    Frequency of Social Gatherings with Friends and Family: Three times a week    Attends Religious Services: More than 4 times per year    Active Member of Clubs or Organizations: Yes    Attends Banker Meetings: More than 4 times per year    Marital Status: Married    Vitals:   11/11/24 1342  BP: 120/70  Pulse: 69  Resp: 16  Temp: 97.9 F (36.6 C)  SpO2: 96%   Body mass index is 23.79 kg/m.  Physical Exam Vitals and nursing note reviewed.  Constitutional:      General: She is not in acute distress.    Appearance: She is well-developed.  HENT:     Head: Normocephalic and atraumatic.     Mouth/Throat:     Mouth: Mucous membranes are moist.  Eyes:     Conjunctiva/sclera: Conjunctivae normal.  Cardiovascular:     Rate and Rhythm: Normal rate and regular rhythm.  Pulmonary:     Effort: Pulmonary effort is  normal. No respiratory distress.     Breath sounds: Normal breath sounds.  Abdominal:     Palpations: Abdomen is soft. There is no mass.     Tenderness: There is no abdominal tenderness. There is no right CVA tenderness or left CVA tenderness.  Skin:    General: Skin is warm.     Findings: No erythema.  Neurological:     Mental Status: She is alert and oriented to person, place, and time.     Gait: Gait normal.  Psychiatric:        Mood and Affect: Mood and affect normal.   ASSESSMENT AND PLAN:  Wendy Joyce was seen today for dysuria, cough,and travel counseling.  Dysuria With some urinary urgency and frequency. We discussed possible etiologies. Urine dipstick is not very suggestive of UTI. Trace of leuk and rest negative. Having some pruritus, ? Fungal infection. She agrees with empiric treatment with vaginal Terconazole cream. Monitor for new symptoms. Instructed about warning signs. Will follow Ucx and make recommendations accordingly.       -    Terconazole; Place 1 applicator vaginally at bedtime for 10 days.  Dispense: 45 g; Refill: 0  -     POCT urinalysis dipstick -     Urine Culture; Future  Cough, unspecified type Lung auscultation negative. Concerned about pneumonia, CXR ordered today. We discussed possible etiologies, including allergies and GERD. If persistent, we can consider PPI trial.  -     DG  Chest 2 View; Future  Counseling about travel She will be in a cruise, visiting 2 main cities in [place].  We reviewed recommendations in regard to immunization. She would like to have the oral typhoid vaccine.  -     Typhoid Vaccine; Take 1 capsule by mouth every other day for 8 days. Last dose to be completed a week before traveling.  Dispense: 4 capsule; Refill: 0   Return if symptoms worsen or fail to improve, for keep next appointment.  Yogesh Cominsky G. Cherolyn Behrle, MD  [facility] [place] office.

## 2024-11-11 NOTE — Patient Instructions (Addendum)
 A few things to remember from today's visit:  Dysuria - Plan: POC Urinalysis Dipstick, Urine Culture, Urine Culture  Counseling about travel - Plan: typhoid (VIVOTIF) DR capsule  Cough, unspecified type - Plan: DG Chest 2 View  Urine today is not suggestive for UTI. Try vaginal cream for possible yeast.  Monitor for new symptoms.  If you need refills for medications you take chronically, please call your pharmacy. Do not use My Chart to request refills or for acute issues that need immediate attention. If you send a my chart message, it may take a few days to be addressed, specially if I am not in the office.  Please be sure medication list is accurate. If a new problem present, please set up appointment sooner than planned today.

## 2024-11-12 ENCOUNTER — Other Ambulatory Visit (HOSPITAL_BASED_OUTPATIENT_CLINIC_OR_DEPARTMENT_OTHER): Payer: Self-pay

## 2024-11-12 LAB — URINE CULTURE
MICRO NUMBER:: 17244232
SPECIMEN QUALITY:: ADEQUATE

## 2024-11-13 ENCOUNTER — Ambulatory Visit: Payer: Self-pay | Admitting: Family Medicine

## 2024-11-15 ENCOUNTER — Ambulatory Visit (HOSPITAL_BASED_OUTPATIENT_CLINIC_OR_DEPARTMENT_OTHER): Payer: Self-pay | Admitting: Orthopaedic Surgery

## 2024-11-15 ENCOUNTER — Other Ambulatory Visit (HOSPITAL_BASED_OUTPATIENT_CLINIC_OR_DEPARTMENT_OTHER): Payer: Self-pay

## 2024-11-15 ENCOUNTER — Ambulatory Visit (HOSPITAL_BASED_OUTPATIENT_CLINIC_OR_DEPARTMENT_OTHER): Admitting: Orthopaedic Surgery

## 2024-11-15 DIAGNOSIS — G8929 Other chronic pain: Secondary | ICD-10-CM

## 2024-11-15 DIAGNOSIS — M25511 Pain in right shoulder: Secondary | ICD-10-CM

## 2024-11-15 NOTE — Progress Notes (Signed)
 Chief Complaint: Right shoulder pain     History of Present Illness:   11/15/2024: Resents today for MRI follow-up of the right shoulder   Surgical History:   None  PMH/PSH/Family History/Social History/Meds/Allergies:    Past Medical History:  Diagnosis Date   Abdominal pain, generalized 01/29/2015   Abnormal thyroid  function test 02/25/2016   ADHD (attention deficit hyperactivity disorder)    Allergic rhinitis, unspecified 01/29/2015   Allergy     Anemia    DOE   Ankle sprain 03/16/2016   Anxiety 01/29/2015   Arthritis    hands   Chronic kidney disease 2012   kidney stones   Constipation 01/29/2015   Decreased libido 05/02/2018   Disorder of the skin and subcutaneous tissue, unspecified 01/29/2015   DJD (degenerative joint disease)    hand   Dysthymia    Endometriosis    External otitis    Family history of coronary artery disease 05/02/2018   Fatigue due to excessive exertion 02/24/2016   Fever blister    Fibromyalgia 01/29/2015   Frequency of micturition 01/29/2015   Functional dyspepsia 01/29/2015   Gastro-esophageal reflux disease without esophagitis 01/29/2015   Headache(784.0)    High cholesterol    Hyperlipidemia 09/12/2016   Hypersomnia    Hypothyroidism    IBS (irritable bowel syndrome) 05/02/2018   Insomnia    Internal hemorrhoids    Irritable 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 01/29/2015   Labial pain 11/30/2016   Lactose intolerance 12/14/2016   Lactose intolerance in adult 09/12/2016   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   Left ureteral stone    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   Major depressive disorder, single episode, unspecified    Migraine with aura, not intractable, without status migrainosus    Mixed hyperlipidemia    Mouth pain 01/07/2016   Osteoarthritis of hip    Osteopenia determined by x-ray 03/02/2017   Personal history of estrogen therapy    Phlebitis and thrombophlebitis of the leg     Pityriasis alba 01/29/2015   Pure hypercholesterolemia 06/29/2016   Rectum pain 09/12/2016   Recurrent sinus infections 02/24/2016   Rhinosinusitis 01/07/2016   Rosacea    Sinusitis 12/24/2015   Tinnitus of left ear 01/29/2015   Umbilical hernia without obstruction and without gangrene 01/29/2015   Umbilical pain 05/02/2018   Unspecified urinary incontinence 01/07/2016   Urticaria    UTI (urinary tract infection)    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   Vitamin D  deficiency    Past Surgical History:  Procedure Laterality Date   BREAST ENHANCEMENT SURGERY     BREAST REDUCTION SURGERY Bilateral 2018   COLONOSCOPY     COLONOSCOPY WITH PROPOFOL  N/A 02/25/2013   Procedure: COLONOSCOPY WITH PROPOFOL ;  Surgeon: Gladis MARLA Louder, MD;  Location: [facility];  Service: Endoscopy;  Laterality: N/A;   left hand surgery  12/28/2022   mini facelift  2000   NASAL SINUS SURGERY     REDUCTION MAMMAPLASTY     ROTATOR CUFF REPAIR     Right   ROTATOR CUFF REPAIR     Left   SEPTOPLASTY WITH ETHMOIDECTOMY, AND MAXILLARY ANTROSTOMY Bilateral 2006   TOTAL ABDOMINAL HYSTERECTOMY     ovaries remain   TUBAL LIGATION     umbicial hernia     Social History  Socioeconomic History   Marital status: Married    Spouse name: Not on file   Number of children: Not on file   Years of education: Not on file   Highest education level: Bachelor's degree (e.g., BA, AB, BS)  Occupational History   Not on file  Tobacco Use   Smoking status: Never   Smokeless tobacco: Never  Vaping Use   Vaping status: Never Used  Substance and Sexual Activity   Alcohol  use: Yes    Alcohol /week: 2.0 standard drinks of alcohol     Types: 2 Glasses of wine per week    Comment: weekends occ   Drug use: No    Comment: CBD cream for pain twice weekly   Sexual activity: Yes    Partners: Male    Birth control/protection: Post-menopausal  Other Topics Concern   Not on file  Social  History Narrative   Married   Regular exercise: yes; 2 x a week with a trainer   Caffeine use: coffee daily   Social Drivers of Corporate Investment Banker Strain: Low Risk  (11/05/2024)   Overall Financial Resource Strain (CARDIA)    Difficulty of Paying Living Expenses: Not hard at all  Food Insecurity: No Food Insecurity (11/05/2024)   Hunger Vital Sign    Worried About Running Out of Food in the Last Year: Never true    Ran Out of Food in the Last Year: Never true  Transportation Needs: No Transportation Needs (11/05/2024)   PRAPARE - Administrator, Civil Service (Medical): No    Lack of Transportation (Non-Medical): No  Physical Activity: Sufficiently Active (11/05/2024)   Exercise Vital Sign    Days of Exercise per Week: 7 days    Minutes of Exercise per Session: 30 min  Stress: Stress Concern Present (11/05/2024)   [employer] of Occupational Health - Occupational Stress Questionnaire    Feeling of Stress: To some extent  Social Connections: Socially Integrated (11/05/2024)   Social Connection and Isolation Panel    Frequency of Communication with Friends and Family: More than three times a week    Frequency of Social Gatherings with Friends and Family: Three times a week    Attends Religious Services: More than 4 times per year    Active Member of Clubs or Organizations: Yes    Attends Engineer, Structural: More than 4 times per year    Marital Status: Married   Family History  Problem Relation Age of Onset   Stroke Father    Hypertension Father    Cancer Father    Breast cancer Maternal Grandmother    Colon cancer Neg Hx    Colon polyps Neg Hx    Esophageal cancer Neg Hx    Rectal cancer Neg Hx    Stomach cancer Neg Hx    Allergies  Allergen Reactions   Adhesive [Tape] Dermatitis   Atovaquone-Proguanil Hcl Other (See Comments)   Bioflavonoids    Citrus Nausea And Vomiting   Emedastine Nausea And Vomiting   Escitalopram Hives    Escitalopram Oxalate Hives   Ezetimibe-Simvastatin Other (See Comments)   Niacin Other (See Comments)   Niacin And Related     Rash and breathing breathing problems   Rosuvastatin Other (See Comments)   Versed  [Midazolam ] Nausea And Vomiting    For a week per pt   Amoxicillin Rash    Unsure of reaction/ was instructed not to take drug. Unknown reaction    Hydrochlorothiazide Rash  Current Outpatient Medications  Medication Sig Dispense Refill   acyclovir  ointment (ZOVIRAX ) 5 % Apply 1 application. topically daily as needed (for fever blister).     ALPRAZolam  (XANAX ) 0.25 MG tablet Take 1-2 tablets (0.25-0.5 mg total) by mouth daily as needed. 60 tablet 2   benzonatate  (TESSALON ) 200 MG capsule Take 1 capsule (200 mg total) by mouth 3 (three) times daily as needed for cough. 21 capsule 0   budesonide -formoterol  (SYMBICORT ) 160-4.5 MCG/ACT inhaler Inhale 2 puffs into the lungs 2 (two) times daily. 10.2 g 0   estradiol  (VIVELLE -DOT) 0.025 MG/24HR Place 1 patch onto the skin on abdomen 2 (two) times a week. 16 patch 5   Influenza-SARS At Home Antigen (COVID-19 FLU A+B ANTIGEN TEST) KIT Use as directed. 1 kit 0   ipratropium (ATROVENT ) 0.06 % nasal spray Place 2 sprays into both nostrils 3 (three) times daily for 4 days, then stop. Not for long term use. 15 mL 0   levothyroxine  (SYNTHROID ) 125 MCG tablet Take 1 tablet (125 mcg total) by mouth in the morning on an empty stomach. Wait 1 hour to have food or coffee. 90 tablet 1   metFORMIN  (GLUCOPHAGE ) 500 MG tablet Take 0.5 tablet by mouth daily for 7 days, THEN 0.5 tablet twice daily for 7 days, THEN 1 tablet in AM and 0.5 tablet in PM for 7 days, THEN 1 tablet twice daily. (Patient taking differently: Take 250 mg by mouth. Patient taking 1/2 of 500 mg daily) 90 tablet 0   MIEBO  1.338 GM/ML SOLN Place 1 drop into both eyes in the morning, at noon, and at bedtime.     montelukast  (SINGULAIR ) 10 MG tablet Take 1 tablet (10 mg total) by mouth  daily. During seasons symptoms are more frequent. 90 tablet 1   Naltrexone  HCl, Pain, 4.5 MG CAPS Prescribed by psychiatry for depression. Once a day. 30 capsule 0   neomycin -polymyxin-dexameth (MAXITROL ) 0.1 % OINT Apply a small amount on eyelid twice a day (Patient taking differently: Place 1 Application into both eyes as needed.) 3.5 g 2   Perfluorohexyloctane  (MIEBO ) 1.338 GM/ML SOLN Place 1 drop into both eyes 2 (two) to 4 (four) times daily. (Must see package insert for administration instructions.) 3 mL 3   pilocarpine  (SALAGEN ) 5 MG tablet Take 1 tablet (5 mg total) by mouth in the morning, at noon, and at bedtime. 90 tablet 1   Polyethyl Glycol-Propyl Glycol (SYSTANE) 0.4-0.3 % SOLN Apply 1 drop to eye daily. (Patient taking differently: Apply 1 drop to eye as needed.)     progesterone  (PROMETRIUM ) 100 MG capsule Take 1 capsule (100 mg total) by mouth before bedtime for 1 week. May increase to 2 capsules to help with sleep. 90 capsule 3   sodium chloride  (OCEAN) 0.65 % SOLN nasal spray Place 2 sprays into both nostrils every 6 (six) hours. 44 mL 5   SYNTHROID  125 MCG tablet Take 1 tablet (125 mcg total) by mouth every Monday, Wednesday, and Friday. (Patient taking differently: Take 125 mcg by mouth daily. Does not take on Sunday) 40 tablet 2   terconazole  (TERAZOL 7 ) 0.4 % vaginal cream Place 1 applicator vaginally at bedtime for 10 days. 45 g 0   typhoid (VIVOTIF ) DR capsule Take 1 capsule by mouth every other day for 8 days. Last dose to be completed a week before traveling. 4 capsule 0   valACYclovir  (VALTREX ) 500 MG tablet Take 1 tablet (500 mg total) by mouth 2 (two) times daily. 180 tablet  2   zaleplon  (SONATA ) 10 MG capsule Take 1 capsule (10 mg total) by mouth at bedtime as needed for sleep. 30 capsule 1   No current facility-administered medications for this visit.   No results found.  Review of Systems:   A ROS was performed including pertinent positives and negatives as  documented in the HPI.  Physical Exam :   Constitutional: NAD and appears stated age Neurological: Alert and oriented Psych: Appropriate affect and cooperative There were no vitals taken for this visit.   Comprehensive Musculoskeletal Exam:    Right shoulder forward elevation is 165 degrees bilaterally.  External rotation at the side is 60 degrees.  Internal rotation is L1 bilaterally.  Positive Neer impingement distal neurosensory exam is intact  Imaging:   Xray (3 views right shoulder): Normal  MRI right shoulder: There is a large meniscus homologue in the [facility] joint that has appeared to inferior radially and is causing significant impingement of the rotator cuff I personally reviewed and interpreted the radiographs.   Assessment:   78 y.o. 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  I did discuss that this is likely why I do not believe she is getting persistent relief with therapy and injection.  She did get temporary relief from the subacromial injection but this is subsequently worn off.  Given this we discussed possibility of right shoulder arthroscopy.  I did discuss that I would recommend a subacromial decompression and debridement of this meniscal homologue tissue from below the [facility] joint but I would also recommend rotator cuff collagen patch augmentation in order to optimize the health of the rotator cuff long-term particularly as there is some thinning and bursal sided tearing of the MRI Plan :    - Plan for right shoulder arthroscopy with subacromial decompression and collagen patch augmentation   After a lengthy discussion of treatment options, including risks, benefits, alternatives, complications of surgical and nonsurgical conservative options, the patient elected surgical repair.   The patient  is aware of the  material risks  and complications including, but not limited to injury to adjacent structures, neurovascular injury, infection, numbness, bleeding, implant failure, thermal burns, stiffness, persistent pain, failure to heal, disease transmission from allograft, need for further surgery, dislocation, anesthetic risks, blood clots, risks of death,and others. The probabilities of surgical success and failure discussed with patient given their particular co-morbidities.The time and nature of expected rehabilitation and recovery was discussed.The patient's questions were all answered preoperatively.  No barriers to understanding were noted. I explained the natural history of the disease process and Rx rationale.  I explained to the patient what I considered to be reasonable expectations given their personal situation.  The final treatment plan was arrived at through a shared patient decision making process model.    I personally saw and evaluated the patient, and participated in the management and treatment plan.  Elspeth Parker, MD Attending Physician, Orthopedic Surgery  This document was dictated using Dragon voice recognition software. A reasonable attempt at proof reading has been made to minimize errors.

## 2024-11-25 ENCOUNTER — Other Ambulatory Visit (HOSPITAL_BASED_OUTPATIENT_CLINIC_OR_DEPARTMENT_OTHER): Payer: Self-pay

## 2024-11-26 ENCOUNTER — Other Ambulatory Visit (HOSPITAL_BASED_OUTPATIENT_CLINIC_OR_DEPARTMENT_OTHER): Payer: Self-pay

## 2024-11-28 ENCOUNTER — Other Ambulatory Visit

## 2024-11-28 ENCOUNTER — Other Ambulatory Visit (HOSPITAL_BASED_OUTPATIENT_CLINIC_OR_DEPARTMENT_OTHER): Payer: Self-pay

## 2024-12-02 ENCOUNTER — Other Ambulatory Visit (HOSPITAL_BASED_OUTPATIENT_CLINIC_OR_DEPARTMENT_OTHER): Payer: Self-pay

## 2024-12-04 ENCOUNTER — Other Ambulatory Visit (HOSPITAL_BASED_OUTPATIENT_CLINIC_OR_DEPARTMENT_OTHER): Payer: Self-pay

## 2024-12-09 ENCOUNTER — Other Ambulatory Visit: Payer: Self-pay | Admitting: Family Medicine

## 2024-12-09 ENCOUNTER — Other Ambulatory Visit (HOSPITAL_BASED_OUTPATIENT_CLINIC_OR_DEPARTMENT_OTHER): Payer: Self-pay

## 2024-12-09 MED ORDER — ESTRADIOL 0.025 MG/24HR TD PTTW
1.0000 | MEDICATED_PATCH | TRANSDERMAL | 5 refills | Status: DC
  Filled 2024-12-09 – 2024-12-12 (×2): qty 16, 56d supply, fill #0

## 2024-12-09 NOTE — Telephone Encounter (Signed)
 Copied from CRM [id number]. Topic: Clinical - Medication Refill >> Dec 09, 2024 12:13 PM Berwyn MATSU wrote: Medication: estradioL  (ESTRACE ) 0.01 % (0.1 mg/gram) vaginal cream  Insert blueberry size amount of cream on finger (or 0.5 grams with applicator) in vagina daily x2 week then 2x per week. 42.5 g  ; per patient she needs this medication and gyn has d/c back to PCP and patient was told she needs to be on this medication for life.    Has the patient contacted their pharmacy? Yes (Agent: If no, request that the patient contact the pharmacy for the refill. If patient does not wish to contact the pharmacy document the reason why and proceed with request.) (Agent: If yes, when and what did the pharmacy advise?)  This is the patient's preferred pharmacy:  [facility] [address] [place] [address] Phone: [phone] Fax: [phone]  Is this the correct pharmacy for this prescription? Yes If no, delete pharmacy and type the correct one.   Has the prescription been filled recently? Yes  Is the patient out of the medication? Yes  Has the patient been seen for an appointment in the last year OR does the patient have an upcoming appointment? Yes  Can we respond through MyChart? No; patient request phone call.   Agent: Please be advised that Rx refills may take up to 3 business days. We ask that you follow-up with your pharmacy.

## 2024-12-10 ENCOUNTER — Other Ambulatory Visit (HOSPITAL_BASED_OUTPATIENT_CLINIC_OR_DEPARTMENT_OTHER): Payer: Self-pay

## 2024-12-12 ENCOUNTER — Other Ambulatory Visit (HOSPITAL_BASED_OUTPATIENT_CLINIC_OR_DEPARTMENT_OTHER): Payer: Self-pay

## 2024-12-12 ENCOUNTER — Other Ambulatory Visit: Payer: Self-pay | Admitting: Family Medicine

## 2024-12-12 ENCOUNTER — Other Ambulatory Visit: Payer: Self-pay

## 2024-12-12 ENCOUNTER — Other Ambulatory Visit (HOSPITAL_COMMUNITY): Payer: Self-pay

## 2024-12-12 NOTE — Telephone Encounter (Unsigned)
 Copied from CRM #[id number]. Topic: Clinical - Medication Refill >> Dec 12, 2024  9:36 AM Nessti S wrote: Medication: estradiol  (VIVELLE -DOT) 0.025 MG/24HR   Has the patient contacted their pharmacy? Yes (Agent: If no, request that the patient contact the pharmacy for the refill. If patient does not wish to contact the pharmacy document the reason why and proceed with request.) (Agent: If yes, when and what did the pharmacy advise?)  This is the patient's preferred pharmacy:  [facility] [address] [place] [address] Phone: [phone] Fax: [phone]  Is this the correct pharmacy for this prescription? Yes If no, delete pharmacy and type the correct one.   Has the prescription been filled recently? Yes  Is the patient out of the medication? Yes  Has the patient been seen for an appointment in the last year OR does the patient have an upcoming appointment? Yes  Can we respond through MyChart? No patient request phone call.  Agent: Please be advised that Rx refills may take up to 3 business days. We ask that you follow-up with your pharmacy.

## 2024-12-13 ENCOUNTER — Other Ambulatory Visit (HOSPITAL_BASED_OUTPATIENT_CLINIC_OR_DEPARTMENT_OTHER): Payer: Self-pay

## 2024-12-13 MED ORDER — ESTRADIOL 0.025 MG/24HR TD PTTW
1.0000 | MEDICATED_PATCH | TRANSDERMAL | 5 refills | Status: AC
  Filled 2024-12-13 – 2024-12-30 (×2): qty 16, 56d supply, fill #0
  Filled 2025-01-13: qty 24, 84d supply, fill #0

## 2024-12-14 ENCOUNTER — Other Ambulatory Visit (HOSPITAL_BASED_OUTPATIENT_CLINIC_OR_DEPARTMENT_OTHER): Payer: Self-pay

## 2024-12-14 ENCOUNTER — Other Ambulatory Visit: Payer: Self-pay | Admitting: Family Medicine

## 2024-12-16 ENCOUNTER — Other Ambulatory Visit: Payer: Self-pay

## 2024-12-24 ENCOUNTER — Telehealth (INDEPENDENT_AMBULATORY_CARE_PROVIDER_SITE_OTHER): Payer: Self-pay

## 2024-12-24 NOTE — Telephone Encounter (Signed)
 I spoke with patient when she called into office today at 1:29pm. She is in [place] and wanted to cancel up coming appointment. She will call and reschedule when [place] gets [place].

## 2024-12-30 ENCOUNTER — Other Ambulatory Visit (HOSPITAL_BASED_OUTPATIENT_CLINIC_OR_DEPARTMENT_OTHER): Payer: Self-pay

## 2024-12-31 ENCOUNTER — Ambulatory Visit (INDEPENDENT_AMBULATORY_CARE_PROVIDER_SITE_OTHER): Admitting: Otolaryngology

## 2025-01-02 ENCOUNTER — Other Ambulatory Visit (HOSPITAL_BASED_OUTPATIENT_CLINIC_OR_DEPARTMENT_OTHER): Payer: Self-pay

## 2025-01-10 ENCOUNTER — Other Ambulatory Visit (HOSPITAL_BASED_OUTPATIENT_CLINIC_OR_DEPARTMENT_OTHER): Payer: Self-pay

## 2025-01-11 ENCOUNTER — Other Ambulatory Visit (HOSPITAL_BASED_OUTPATIENT_CLINIC_OR_DEPARTMENT_OTHER): Payer: Self-pay

## 2025-01-13 ENCOUNTER — Other Ambulatory Visit (HOSPITAL_BASED_OUTPATIENT_CLINIC_OR_DEPARTMENT_OTHER): Payer: Self-pay

## 2025-01-13 LAB — OPHTHALMOLOGY REPORT-SCANNED

## 2025-01-14 ENCOUNTER — Telehealth: Payer: Self-pay | Admitting: Orthopaedic Surgery

## 2025-01-14 NOTE — Telephone Encounter (Signed)
 Pt called saying that she is having sharp pains in her right elbow and is wondering if she could get an injection to help with this before her surgery. Call back number is [phone].

## 2025-01-14 NOTE — Telephone Encounter (Signed)
 Glorine coming to see you tomorrow

## 2025-01-15 ENCOUNTER — Ambulatory Visit (INDEPENDENT_AMBULATORY_CARE_PROVIDER_SITE_OTHER): Admitting: Student

## 2025-01-15 DIAGNOSIS — M7701 Medial epicondylitis, right elbow: Secondary | ICD-10-CM | POA: Diagnosis not present

## 2025-01-15 DIAGNOSIS — M25521 Pain in right elbow: Secondary | ICD-10-CM

## 2025-01-15 MED ORDER — LIDOCAINE HCL 1 % IJ SOLN
2.0000 mL | INTRAMUSCULAR | Status: AC | PRN
  Administered 2025-01-15: 2 mL

## 2025-01-15 MED ORDER — BETAMETHASONE SOD PHOS & ACET 6 (3-3) MG/ML IJ SUSP
2.0000 mL | INTRAMUSCULAR | Status: AC | PRN
  Administered 2025-01-15: 2 mL via INTRA_ARTICULAR

## 2025-01-15 NOTE — Progress Notes (Signed)
 "                                Chief Complaint: Right elbow pain     History of Present Illness:    Wendy Joyce is a 79 y.o. right-hand-dominant female who presents today for evaluation of right elbow pain.  Pain began within the last few weeks while on a trip to [place] although she notes no known injury.  Pain is localized to the medial elbow and is described as a sharp pain, typically worsening at nighttime.  She denies any radiating pain, numbness, or tingling.  She states that during the trip she was pulling around a suitcase which she is unsure if this is involved.  She is scheduled for a right shoulder rotator cuff repair with Dr. Genelle on 2/23.   Surgical History:   None  PMH/PSH/Family History/Social History/Meds/Allergies:    Past Medical History:  Diagnosis Date   Abdominal pain, generalized 01/29/2015   Abnormal thyroid  function test 02/25/2016   ADHD (attention deficit hyperactivity disorder)    Allergic rhinitis, unspecified 01/29/2015   Allergy     Anemia    DOE   Ankle sprain 03/16/2016   Anxiety 01/29/2015   Arthritis    hands   Chronic kidney disease 2012   kidney stones   Constipation 01/29/2015   Decreased libido 05/02/2018   Disorder of the skin and subcutaneous tissue, unspecified 01/29/2015   DJD (degenerative joint disease)    hand   Dysthymia    Endometriosis    External otitis    Family history of coronary artery disease 05/02/2018   Fatigue due to excessive exertion 02/24/2016   Fever blister    Fibromyalgia 01/29/2015   Frequency of micturition 01/29/2015   Functional dyspepsia 01/29/2015   Gastro-esophageal reflux disease without esophagitis 01/29/2015   Headache(784.0)    High cholesterol    Hyperlipidemia 09/12/2016   Hypersomnia    Hypothyroidism    IBS (irritable bowel syndrome) 05/02/2018   Insomnia    Internal hemorrhoids    Irritable 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 01/29/2015   Labial pain 11/30/2016   Lactose  intolerance 12/14/2016   Lactose intolerance in adult 09/12/2016   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   Left ureteral stone    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   Major depressive disorder, single episode, unspecified    Migraine with aura, not intractable, without status migrainosus    Mixed hyperlipidemia    Mouth pain 01/07/2016   Osteoarthritis of hip    Osteopenia determined by x-ray 03/02/2017   Personal history of estrogen therapy    Phlebitis and thrombophlebitis of the leg    Pityriasis alba 01/29/2015   Pure hypercholesterolemia 06/29/2016   Rectum pain 09/12/2016   Recurrent sinus infections 02/24/2016   Rhinosinusitis 01/07/2016   Rosacea    Sinusitis 12/24/2015   Tinnitus of left ear 01/29/2015   Umbilical hernia without obstruction and without gangrene 01/29/2015   Umbilical pain 05/02/2018   Unspecified urinary incontinence 01/07/2016   Urticaria    UTI (urinary tract infection)    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   Vitamin D  deficiency    Past Surgical History:  Procedure Laterality Date   BREAST ENHANCEMENT SURGERY     BREAST REDUCTION SURGERY Bilateral 2018   COLONOSCOPY     COLONOSCOPY WITH PROPOFOL  N/A 02/25/2013   Procedure: COLONOSCOPY WITH PROPOFOL ;  Surgeon: Gladis MARLA Louder,  MD;  Location: [facility];  Service: Endoscopy;  Laterality: N/A;   left hand surgery  12/28/2022   mini facelift  2000   NASAL SINUS SURGERY     REDUCTION MAMMAPLASTY     ROTATOR CUFF REPAIR     Right   ROTATOR CUFF REPAIR     Left   SEPTOPLASTY WITH ETHMOIDECTOMY, AND MAXILLARY ANTROSTOMY Bilateral 2006   TOTAL ABDOMINAL HYSTERECTOMY     ovaries remain   TUBAL LIGATION     umbicial hernia     Social History   Socioeconomic History   Marital status: Married    Spouse name: Not on file   Number of children: Not on file   Years of education: Not on file   Highest education level: Bachelor's degree (e.g., BA, AB, BS)   Occupational History   Not on file  Tobacco Use   Smoking status: Never   Smokeless tobacco: Never  Vaping Use   Vaping status: Never Used  Substance and Sexual Activity   Alcohol  use: Yes    Alcohol /week: 2.0 standard drinks of alcohol     Types: 2 Glasses of wine per week    Comment: weekends occ   Drug use: No    Comment: CBD cream for pain twice weekly   Sexual activity: Yes    Partners: Male    Birth control/protection: Post-menopausal  Other Topics Concern   Not on file  Social History Narrative   Married   Regular exercise: yes; 2 x a week with a trainer   Caffeine use: coffee daily   Social Drivers of Health   Tobacco Use: Low Risk (11/11/2024)   Patient History    Smoking Tobacco Use: Never    Smokeless Tobacco Use: Never    Passive Exposure: Not on file  Financial Resource Strain: Low Risk (11/05/2024)   Overall Financial Resource Strain (CARDIA)    Difficulty of Paying Living Expenses: Not hard at all  Food Insecurity: No Food Insecurity (11/05/2024)   Epic    Worried About Programme Researcher, Broadcasting/film/video in the Last Year: Never true    Ran Out of Food in the Last Year: Never true  Transportation Needs: No Transportation Needs (11/05/2024)   Epic    Lack of Transportation (Medical): No    Lack of Transportation (Non-Medical): No  Physical Activity: Sufficiently Active (11/05/2024)   Exercise Vital Sign    Days of Exercise per Week: 7 days    Minutes of Exercise per Session: 30 min  Stress: Stress Concern Present (11/05/2024)   [employer] of Occupational Health - Occupational Stress Questionnaire    Feeling of Stress: To some extent  Social Connections: Socially Integrated (11/05/2024)   Social Connection and Isolation Panel    Frequency of Communication with Friends and Family: More than three times a week    Frequency of Social Gatherings with Friends and Family: Three times a week    Attends Religious Services: More than 4 times per year    Active Member  of Clubs or Organizations: Yes    Attends Banker Meetings: More than 4 times per year    Marital Status: Married  Depression (PHQ2-9): Medium Risk (11/05/2024)   Depression (PHQ2-9)    PHQ-2 Score: 5  Alcohol  Screen: Low Risk (11/05/2024)   Alcohol  Screen    Last Alcohol  Screening Score (AUDIT): 2  Housing: Low Risk (11/05/2024)   Epic    Unable to Pay for Housing in the Last Year: No  Number of Times Moved in the Last Year: 0    Homeless in the Last Year: No  Utilities: Not At Risk (11/05/2024)   Epic    Threatened with loss of utilities: No  Health Literacy: Adequate Health Literacy (11/05/2024)   B1300 Health Literacy    Frequency of need for help with medical instructions: Never   Family History  Problem Relation Age of Onset   Stroke Father    Hypertension Father    Cancer Father    Breast cancer Maternal Grandmother    Colon cancer Neg Hx    Colon polyps Neg Hx    Esophageal cancer Neg Hx    Rectal cancer Neg Hx    Stomach cancer Neg Hx    Allergies[1] Current Outpatient Medications  Medication Sig Dispense Refill   acyclovir  ointment (ZOVIRAX ) 5 % Apply 1 application. topically daily as needed (for fever blister).     ALPRAZolam  (XANAX ) 0.25 MG tablet Take 1-2 tablets (0.25-0.5 mg total) by mouth daily as needed. 60 tablet 2   benzonatate  (TESSALON ) 200 MG capsule Take 1 capsule (200 mg total) by mouth 3 (three) times daily as needed for cough. 21 capsule 0   budesonide -formoterol  (SYMBICORT ) 160-4.5 MCG/ACT inhaler Inhale 2 puffs into the lungs 2 (two) times daily. 10.2 g 0   estradiol  (VIVELLE -DOT) 0.025 MG/24HR Place 1 patch onto the skin on abdomen 2 (two) times a week. 16 patch 5   Influenza-SARS At Home Antigen (COVID-19 FLU A+B ANTIGEN TEST) KIT Use as directed. 1 kit 0   ipratropium (ATROVENT ) 0.06 % nasal spray Place 2 sprays into both nostrils 3 (three) times daily for 4 days, then stop. Not for long term use. 15 mL 0   levothyroxine   (SYNTHROID ) 125 MCG tablet Take 1 tablet (125 mcg total) by mouth in the morning on an empty stomach. Wait 1 hour to have food or coffee. 90 tablet 1   metFORMIN  (GLUCOPHAGE ) 500 MG tablet Take 0.5 tablet by mouth daily for 7 days, THEN 0.5 tablet twice daily for 7 days, THEN 1 tablet in AM and 0.5 tablet in PM for 7 days, THEN 1 tablet twice daily. (Patient taking differently: Take 250 mg by mouth. Patient taking 1/2 of 500 mg daily) 90 tablet 0   MIEBO  1.338 GM/ML SOLN Place 1 drop into both eyes in the morning, at noon, and at bedtime.     montelukast  (SINGULAIR ) 10 MG tablet Take 1 tablet (10 mg total) by mouth daily. During seasons symptoms are more frequent. 90 tablet 1   Naltrexone  HCl, Pain, 4.5 MG CAPS Prescribed by psychiatry for depression. Once a day. 30 capsule 0   neomycin -polymyxin-dexameth (MAXITROL ) 0.1 % OINT Apply a small amount on eyelid twice a day (Patient taking differently: Place 1 Application into both eyes as needed.) 3.5 g 2   Perfluorohexyloctane  (MIEBO ) 1.338 GM/ML SOLN Place 1 drop into both eyes 2 (two) to 4 (four) times daily. (Must see package insert for administration instructions.) 3 mL 3   Polyethyl Glycol-Propyl Glycol (SYSTANE) 0.4-0.3 % SOLN Apply 1 drop to eye daily. (Patient taking differently: Apply 1 drop to eye as needed.)     progesterone  (PROMETRIUM ) 100 MG capsule Take 1 capsule (100 mg total) by mouth before bedtime for 1 week. May increase to 2 capsules to help with sleep. 90 capsule 3   sodium chloride  (OCEAN) 0.65 % SOLN nasal spray Place 2 sprays into both nostrils every 6 (six) hours. 44 mL 5   SYNTHROID   125 MCG tablet Take 1 tablet (125 mcg total) by mouth every Monday, Wednesday, and Friday. (Patient taking differently: Take 125 mcg by mouth daily. Does not take on Sunday) 40 tablet 2   valACYclovir  (VALTREX ) 500 MG tablet Take 1 tablet (500 mg total) by mouth 2 (two) times daily. 180 tablet 2   zaleplon  (SONATA ) 10 MG capsule Take 1 capsule (10 mg  total) by mouth at bedtime as needed for sleep. 30 capsule 1   No current facility-administered medications for this visit.   No results found.  Review of Systems:   A ROS was performed including pertinent positives and negatives as documented in the HPI.  Physical Exam :   Constitutional: NAD and appears stated age Neurological: Alert and oriented Psych: Appropriate affect and cooperative There were no vitals taken for this visit.   Comprehensive Musculoskeletal Exam:    Exam of the right elbow demonstrates no obvious deformity or swelling.  There is tenderness to palpation over the medial epicondyle which is exacerbated with resisted wrist flexion.  No lateral epicondyle tenderness or pain with resisted wrist extension.  Full elbow range of motion from 0 to 130 degrees.  No pain with forearm pronation or supination.  Negative Tinel's test at the elbow.  Imaging:     Assessment:   79 y.o. female with atraumatic right elbow pain ongoing for the last few weeks consistent with medial epicondylitis.  Suspect that this may have been due to overuse during her recent trip to [place] which involved carrying around her suitcase.  Discussed treatment options including conservative therapies with PT and counterforce bracing, although patient is scheduled to undergo a right shoulder rotator cuff repair in 1 month.  Given this, I have also offered a cortisone injection of the common flexor tendon and patient would like to proceed with this.  Injection was performed under ultrasound guidance and she tolerated this very well.  Will plan to have her follow-up on this as needed.  Plan :    - Right elbow medial epicondyle injection performed today and continue with right shoulder surgery as planned on 2/23    Procedure Note  Patient: Wendy Joyce             Date of Birth: 1946/04/05           MRN: [id number]             Visit Date: 01/15/2025  Procedures: Visit Diagnoses:  1. Medial  epicondylitis, right     Medium Joint Inj: R elbow on 01/15/2025 4:53 PM Indications: pain Details: 25 G 1.5 in needle, medial approach Medications: 2 mL lidocaine  1 %; 2 mL betamethasone  acetate-betamethasone  sodium phosphate 6 (3-3) MG/ML Outcome: tolerated well, no immediate complications Procedure, treatment alternatives, risks and benefits explained, specific risks discussed. Consent was given by the patient. Immediately prior to procedure a time out was called to verify the correct patient, procedure, equipment, support staff and site/side marked as required. Patient was prepped and draped in the usual sterile fashion.      I personally saw and evaluated the patient, and participated in the management and treatment plan.  Leonce Reveal, PA-C Orthopedics    [1]  Allergies Allergen Reactions   Adhesive [Tape] Dermatitis   Atovaquone-Proguanil Hcl Other (See Comments)   Bioflavonoids    Citrus Nausea And Vomiting   Emedastine Nausea And Vomiting   Escitalopram Hives   Escitalopram Oxalate Hives   Ezetimibe-Simvastatin Other (See Comments)   Niacin Other (See  Comments)   Niacin And Related     Rash and breathing breathing problems   Rosuvastatin Other (See Comments)   Versed  [Midazolam ] Nausea And Vomiting    For a week per pt   Amoxicillin Rash    Unsure of reaction/ was instructed not to take drug. Unknown reaction    Hydrochlorothiazide Rash   "

## 2025-01-22 ENCOUNTER — Other Ambulatory Visit (HOSPITAL_BASED_OUTPATIENT_CLINIC_OR_DEPARTMENT_OTHER): Payer: Self-pay

## 2025-01-23 ENCOUNTER — Other Ambulatory Visit: Payer: Self-pay

## 2025-01-23 ENCOUNTER — Other Ambulatory Visit (HOSPITAL_BASED_OUTPATIENT_CLINIC_OR_DEPARTMENT_OTHER): Payer: Self-pay

## 2025-01-23 MED ORDER — MIEBO 1.338 GM/ML OP SOLN
1.0000 [drp] | Freq: Four times a day (QID) | OPHTHALMIC | 11 refills | Status: AC
  Filled 2025-01-23: qty 3, 7d supply, fill #0

## 2025-01-24 ENCOUNTER — Other Ambulatory Visit (HOSPITAL_BASED_OUTPATIENT_CLINIC_OR_DEPARTMENT_OTHER): Payer: Self-pay

## 2025-01-24 ENCOUNTER — Other Ambulatory Visit: Payer: Self-pay

## 2025-01-27 ENCOUNTER — Ambulatory Visit: Payer: Self-pay

## 2025-01-27 ENCOUNTER — Other Ambulatory Visit (HOSPITAL_BASED_OUTPATIENT_CLINIC_OR_DEPARTMENT_OTHER): Payer: Self-pay

## 2025-01-27 NOTE — Telephone Encounter (Signed)
 FYI Only or Action Required?: FYI only for provider: appointment scheduled on 01/28/25.  Patient was last seen in primary care on 11/11/2024 by Jordan, Betty G, MD.  Called Nurse Triage reporting Urinary Frequency.  Symptoms began about a month ago.  Interventions attempted: Nothing.  Symptoms are: stable.  Triage Disposition: See Physician Within 24 Hours  Patient/caregiver understands and will follow disposition?: Yes    Reason for Triage: Patient has been out of her estradiol  (ESTRACE ) 0.1 MG/GM vaginal cream about a month, has had frequency in urination needs it filled asap   Reason for Disposition  Urinating more frequently than usual (i.e., frequency) OR new-onset of the feeling of an urgent need to urinate (i.e., urgency)  Answer Assessment - Initial Assessment Questions Patient says she asked for a refill of Estradiol  Vaginal Cream 42.5 g that she has been using for over a year for urine frequency and vaginal dryness. She says she has been out for a month and now having urine frequency, up x 4 times last night. She says this was prescribed by a surgeon who told her she doesn't need to follow anymore, so go to PCP for the refills. She says she requested a refill in December before she left for [place] and the Rx was denied in by Dr. Jordan. I advised I do not see that request in the system that it may have been a fax to the office. She denies any associated symptoms, such as burning with urination, back pain, fever; no symptoms mentioned. Advised OV due to a urine check will be needed and she can discuss this with Dr. Jordan regarding the vaginal cream, she agreed. Scheduled for tomorrow afternoon. Advised unsure of the office hours for tomorrow, but today closed due to the weather. She says she understands and if they are closed tomorrow for in person she could do a virtual.    1. SYMPTOM: What's the main symptom you're concerned about? (e.g., frequency, incontinence)      Frequency  2. ONSET: When did the frequency start?     1 month ago having more frequency  3. PAIN: Is there any pain? If Yes, ask: How bad is it? (Scale: 1-10; mild, moderate, severe)     No  4. CAUSE: What do you think is causing the symptoms?     Out of estradiol  vaginal cream  5. OTHER SYMPTOMS: Do you have any other symptoms? (e.g., blood in urine, fever, flank pain, pain with urination)     No  Protocols used: Urinary Symptoms-A-AH

## 2025-01-28 ENCOUNTER — Encounter: Payer: Self-pay | Admitting: Family Medicine

## 2025-01-28 ENCOUNTER — Other Ambulatory Visit (HOSPITAL_BASED_OUTPATIENT_CLINIC_OR_DEPARTMENT_OTHER): Payer: Self-pay

## 2025-01-28 ENCOUNTER — Ambulatory Visit: Admitting: Family Medicine

## 2025-01-28 VITALS — BP 124/70 | HR 69 | Temp 97.7°F | Resp 16 | Ht 62.0 in | Wt 133.6 lb

## 2025-01-28 DIAGNOSIS — R1011 Right upper quadrant pain: Secondary | ICD-10-CM

## 2025-01-28 DIAGNOSIS — N952 Postmenopausal atrophic vaginitis: Secondary | ICD-10-CM

## 2025-01-28 DIAGNOSIS — R350 Frequency of micturition: Secondary | ICD-10-CM | POA: Diagnosis not present

## 2025-01-28 DIAGNOSIS — F5231 Female orgasmic disorder: Secondary | ICD-10-CM | POA: Diagnosis not present

## 2025-01-28 DIAGNOSIS — G8929 Other chronic pain: Secondary | ICD-10-CM

## 2025-01-28 DIAGNOSIS — Z79890 Hormone replacement therapy (postmenopausal): Secondary | ICD-10-CM | POA: Diagnosis not present

## 2025-01-28 MED ORDER — ESTRADIOL 0.01 % VA CREA
1.0000 | TOPICAL_CREAM | VAGINAL | 1 refills | Status: AC
  Filled 2025-01-28: qty 42.5, 90d supply, fill #0

## 2025-01-28 NOTE — Patient Instructions (Signed)
 A few things to remember from today's visit:  Urinary frequency - Plan: Urinalysis with Culture Reflex  Anorgasmia of female - Plan: Ambulatory referral to Gynecology  Atrophic vulvovaginitis - Plan: estradiol  (ESTRACE ) 0.01 % CREA vaginal cream  Chronic RUQ pain  Hx of ongoing treatment with hormonal therapy  Please call to schedule your mammogram.  If you need refills for medications you take chronically, please call your pharmacy. Do not use My Chart to request refills or for acute issues that need immediate attention. If you send a my chart message, it may take a few days to be addressed, specially if I am not in the office.  Please be sure medication list is accurate. If a new problem present, please set up appointment sooner than planned today.

## 2025-01-29 ENCOUNTER — Ambulatory Visit: Payer: Self-pay | Admitting: Family Medicine

## 2025-01-29 ENCOUNTER — Other Ambulatory Visit (HOSPITAL_COMMUNITY): Payer: Self-pay

## 2025-01-29 LAB — URINALYSIS W MICROSCOPIC + REFLEX CULTURE
Bacteria, UA: NONE SEEN /HPF
Bilirubin Urine: NEGATIVE
Glucose, UA: NEGATIVE
Hgb urine dipstick: NEGATIVE
Hyaline Cast: NONE SEEN /LPF
Ketones, ur: NEGATIVE
Leukocyte Esterase: NEGATIVE
Nitrites, Initial: NEGATIVE
Protein, ur: NEGATIVE
RBC / HPF: NONE SEEN /HPF (ref 0–2)
Specific Gravity, Urine: 1.019 (ref 1.001–1.035)
WBC, UA: NONE SEEN /HPF (ref 0–5)
pH: 7 (ref 5.0–8.0)

## 2025-01-29 LAB — NO CULTURE INDICATED

## 2025-01-30 ENCOUNTER — Other Ambulatory Visit: Payer: Self-pay | Admitting: Family Medicine

## 2025-01-30 ENCOUNTER — Other Ambulatory Visit (HOSPITAL_BASED_OUTPATIENT_CLINIC_OR_DEPARTMENT_OTHER): Payer: Self-pay

## 2025-01-30 DIAGNOSIS — G4700 Insomnia, unspecified: Secondary | ICD-10-CM

## 2025-01-30 DIAGNOSIS — N952 Postmenopausal atrophic vaginitis: Secondary | ICD-10-CM | POA: Insufficient documentation

## 2025-01-30 MED ORDER — ALPRAZOLAM 0.25 MG PO TABS
0.2500 mg | ORAL_TABLET | Freq: Every day | ORAL | 0 refills | Status: AC | PRN
  Filled 2025-01-30: qty 60, 30d supply, fill #0

## 2025-01-30 NOTE — Assessment & Plan Note (Signed)
 Chronic. She has had some work up and GI evaluation. Abdomen/pelvic CT done in 07/2023 due to RUQ pain negative except for bilateral non obstructing renal calculi measuring 2-3 mm. No ureteral or bladder calculi. No hydronephrosis. No new symptoms.

## 2025-02-12 ENCOUNTER — Ambulatory Visit: Admitting: Obstetrics and Gynecology

## 2025-02-17 ENCOUNTER — Ambulatory Visit (HOSPITAL_BASED_OUTPATIENT_CLINIC_OR_DEPARTMENT_OTHER): Admit: 2025-02-17 | Admitting: Orthopaedic Surgery

## 2025-02-17 ENCOUNTER — Encounter (HOSPITAL_BASED_OUTPATIENT_CLINIC_OR_DEPARTMENT_OTHER): Payer: Self-pay

## 2025-02-17 SURGERY — ARTHROSCOPY, SHOULDER, WITH ROTATOR CUFF REPAIR
Anesthesia: General | Laterality: Right

## 2025-02-19 ENCOUNTER — Ambulatory Visit (HOSPITAL_BASED_OUTPATIENT_CLINIC_OR_DEPARTMENT_OTHER): Admitting: Physical Therapy

## 2025-02-28 ENCOUNTER — Encounter (HOSPITAL_BASED_OUTPATIENT_CLINIC_OR_DEPARTMENT_OTHER)

## 2025-02-28 ENCOUNTER — Encounter (HOSPITAL_BASED_OUTPATIENT_CLINIC_OR_DEPARTMENT_OTHER): Admitting: Orthopaedic Surgery

## 2025-03-05 ENCOUNTER — Encounter (HOSPITAL_BASED_OUTPATIENT_CLINIC_OR_DEPARTMENT_OTHER)

## 2025-03-12 ENCOUNTER — Encounter (HOSPITAL_BASED_OUTPATIENT_CLINIC_OR_DEPARTMENT_OTHER): Admitting: Physical Therapy

## 2025-03-17 ENCOUNTER — Encounter (HOSPITAL_BASED_OUTPATIENT_CLINIC_OR_DEPARTMENT_OTHER)

## 2025-03-19 ENCOUNTER — Encounter (HOSPITAL_BASED_OUTPATIENT_CLINIC_OR_DEPARTMENT_OTHER): Admitting: Physical Therapy

## 2025-03-24 ENCOUNTER — Encounter (HOSPITAL_BASED_OUTPATIENT_CLINIC_OR_DEPARTMENT_OTHER)

## 2025-03-26 ENCOUNTER — Encounter (HOSPITAL_BASED_OUTPATIENT_CLINIC_OR_DEPARTMENT_OTHER): Admitting: Physical Therapy

## 2025-03-31 ENCOUNTER — Encounter (HOSPITAL_BASED_OUTPATIENT_CLINIC_OR_DEPARTMENT_OTHER): Admitting: Physical Therapy

## 2025-04-02 ENCOUNTER — Encounter (HOSPITAL_BASED_OUTPATIENT_CLINIC_OR_DEPARTMENT_OTHER): Admitting: Physical Therapy

## 2025-04-07 ENCOUNTER — Encounter (HOSPITAL_BASED_OUTPATIENT_CLINIC_OR_DEPARTMENT_OTHER): Admitting: Physical Therapy

## 2025-04-09 ENCOUNTER — Encounter (HOSPITAL_BASED_OUTPATIENT_CLINIC_OR_DEPARTMENT_OTHER): Admitting: Physical Therapy

## 2025-04-10 ENCOUNTER — Ambulatory Visit: Admitting: Adult Health

## 2025-07-09 ENCOUNTER — Encounter (INDEPENDENT_AMBULATORY_CARE_PROVIDER_SITE_OTHER): Admitting: Ophthalmology
